# Patient Record
Sex: Female | Born: 1935
Health system: Southern US, Community
[De-identification: ages and names within clinical notes are randomized; demographics above are authoritative.]

## PROBLEM LIST (undated history)

## (undated) DIAGNOSIS — M5416 Radiculopathy, lumbar region: Secondary | ICD-10-CM

## (undated) DIAGNOSIS — E785 Hyperlipidemia, unspecified: Secondary | ICD-10-CM

## (undated) DIAGNOSIS — H3530 Unspecified macular degeneration: Secondary | ICD-10-CM

## (undated) DIAGNOSIS — F329 Major depressive disorder, single episode, unspecified: Secondary | ICD-10-CM

## (undated) DIAGNOSIS — Z8542 Personal history of malignant neoplasm of other parts of uterus: Secondary | ICD-10-CM

## (undated) DIAGNOSIS — R29898 Other symptoms and signs involving the musculoskeletal system: Principal | ICD-10-CM

## (undated) DIAGNOSIS — G5602 Carpal tunnel syndrome, left upper limb: Secondary | ICD-10-CM

## (undated) DIAGNOSIS — M5430 Sciatica, unspecified side: Secondary | ICD-10-CM

## (undated) DIAGNOSIS — C801 Malignant (primary) neoplasm, unspecified: Secondary | ICD-10-CM

## (undated) DIAGNOSIS — H8110 Benign paroxysmal vertigo, unspecified ear: Secondary | ICD-10-CM

## (undated) DIAGNOSIS — M797 Fibromyalgia: Secondary | ICD-10-CM

## (undated) DIAGNOSIS — R4189 Other symptoms and signs involving cognitive functions and awareness: Secondary | ICD-10-CM

## (undated) DIAGNOSIS — G459 Transient cerebral ischemic attack, unspecified: Secondary | ICD-10-CM

## (undated) DIAGNOSIS — F32A Depression, unspecified: Secondary | ICD-10-CM

## (undated) DIAGNOSIS — H02401 Unspecified ptosis of right eyelid: Secondary | ICD-10-CM

## (undated) DIAGNOSIS — L920 Granuloma annulare: Secondary | ICD-10-CM

## (undated) HISTORY — DX: Major depressive disorder, single episode, unspecified: F32.9

## (undated) HISTORY — DX: Depression, unspecified: F32.A

## (undated) HISTORY — DX: Hyperlipidemia, unspecified: E78.5

## (undated) HISTORY — DX: Sciatica, unspecified side: M54.30

## (undated) HISTORY — DX: Transient cerebral ischemic attack, unspecified: G45.9

## (undated) HISTORY — DX: Carpal tunnel syndrome, left upper limb: G56.02

## (undated) HISTORY — PX: TONSILLECTOMY: SUR1361

## (undated) HISTORY — DX: Fibromyalgia: M79.7

## (undated) HISTORY — DX: Benign paroxysmal vertigo, unspecified ear: H81.10

## (undated) HISTORY — PX: ABDOMINAL HYSTERECTOMY: SHX81

## (undated) HISTORY — PX: CATARACT EXTRACTION: SUR2

## (undated) HISTORY — DX: Unspecified ptosis of right eyelid: H02.401

## (undated) HISTORY — DX: Other symptoms and signs involving cognitive functions and awareness: R41.89

## (undated) HISTORY — DX: Other symptoms and signs involving the musculoskeletal system: R29.898

## (undated) HISTORY — DX: Radiculopathy, lumbar region: M54.16

## (undated) HISTORY — DX: Granuloma annulare: L92.0

## (undated) HISTORY — DX: Personal history of malignant neoplasm of other parts of uterus: Z85.42

## (undated) HISTORY — DX: Malignant (primary) neoplasm, unspecified: C80.1

## (undated) HISTORY — DX: Unspecified macular degeneration: H35.30

---

## 1999-02-07 ENCOUNTER — Other Ambulatory Visit: Admission: RE | Admit: 1999-02-07 | Discharge: 1999-02-07 | Payer: Self-pay | Admitting: Obstetrics and Gynecology

## 2000-02-12 ENCOUNTER — Other Ambulatory Visit: Admission: RE | Admit: 2000-02-12 | Discharge: 2000-02-12 | Payer: Self-pay | Admitting: Obstetrics and Gynecology

## 2001-02-16 ENCOUNTER — Other Ambulatory Visit: Admission: RE | Admit: 2001-02-16 | Discharge: 2001-02-16 | Payer: Self-pay | Admitting: Obstetrics and Gynecology

## 2001-07-28 ENCOUNTER — Encounter (INDEPENDENT_AMBULATORY_CARE_PROVIDER_SITE_OTHER): Payer: Self-pay | Admitting: Specialist

## 2001-07-28 ENCOUNTER — Ambulatory Visit (HOSPITAL_COMMUNITY): Admission: RE | Admit: 2001-07-28 | Discharge: 2001-07-28 | Payer: Self-pay | Admitting: Obstetrics and Gynecology

## 2001-12-15 DIAGNOSIS — Z8542 Personal history of malignant neoplasm of other parts of uterus: Secondary | ICD-10-CM

## 2001-12-15 HISTORY — DX: Personal history of malignant neoplasm of other parts of uterus: Z85.42

## 2002-01-05 ENCOUNTER — Ambulatory Visit (HOSPITAL_COMMUNITY): Admission: RE | Admit: 2002-01-05 | Discharge: 2002-01-05 | Payer: Self-pay | Admitting: Obstetrics and Gynecology

## 2002-01-05 ENCOUNTER — Encounter (INDEPENDENT_AMBULATORY_CARE_PROVIDER_SITE_OTHER): Payer: Self-pay

## 2002-01-25 ENCOUNTER — Other Ambulatory Visit: Admission: RE | Admit: 2002-01-25 | Discharge: 2002-01-25 | Payer: Self-pay | Admitting: Obstetrics and Gynecology

## 2002-02-02 ENCOUNTER — Ambulatory Visit: Admission: RE | Admit: 2002-02-02 | Discharge: 2002-02-02 | Payer: Self-pay | Admitting: Gynecology

## 2002-02-11 ENCOUNTER — Encounter: Payer: Self-pay | Admitting: Gynecology

## 2002-02-15 ENCOUNTER — Encounter (INDEPENDENT_AMBULATORY_CARE_PROVIDER_SITE_OTHER): Payer: Self-pay

## 2002-02-15 ENCOUNTER — Inpatient Hospital Stay (HOSPITAL_COMMUNITY): Admission: RE | Admit: 2002-02-15 | Discharge: 2002-02-16 | Payer: Self-pay | Admitting: Gynecology

## 2002-03-30 ENCOUNTER — Ambulatory Visit: Admission: RE | Admit: 2002-03-30 | Discharge: 2002-03-30 | Payer: Self-pay | Admitting: Gynecology

## 2002-05-11 ENCOUNTER — Other Ambulatory Visit: Admission: RE | Admit: 2002-05-11 | Discharge: 2002-05-11 | Payer: Self-pay | Admitting: Obstetrics & Gynecology

## 2002-09-01 ENCOUNTER — Other Ambulatory Visit: Admission: RE | Admit: 2002-09-01 | Discharge: 2002-09-01 | Payer: Self-pay | Admitting: Obstetrics and Gynecology

## 2002-11-30 ENCOUNTER — Other Ambulatory Visit: Admission: RE | Admit: 2002-11-30 | Discharge: 2002-11-30 | Payer: Self-pay | Admitting: Obstetrics and Gynecology

## 2003-03-09 ENCOUNTER — Other Ambulatory Visit: Admission: RE | Admit: 2003-03-09 | Discharge: 2003-03-09 | Payer: Self-pay | Admitting: Obstetrics and Gynecology

## 2003-03-28 ENCOUNTER — Ambulatory Visit (HOSPITAL_COMMUNITY): Admission: RE | Admit: 2003-03-28 | Discharge: 2003-03-28 | Payer: Self-pay | Admitting: Gastroenterology

## 2003-07-10 ENCOUNTER — Other Ambulatory Visit: Admission: RE | Admit: 2003-07-10 | Discharge: 2003-07-10 | Payer: Self-pay | Admitting: Obstetrics and Gynecology

## 2003-11-07 ENCOUNTER — Other Ambulatory Visit: Admission: RE | Admit: 2003-11-07 | Discharge: 2003-11-07 | Payer: Self-pay | Admitting: Obstetrics and Gynecology

## 2004-03-05 ENCOUNTER — Other Ambulatory Visit: Admission: RE | Admit: 2004-03-05 | Discharge: 2004-03-05 | Payer: Self-pay | Admitting: Obstetrics and Gynecology

## 2004-09-03 ENCOUNTER — Other Ambulatory Visit: Admission: RE | Admit: 2004-09-03 | Discharge: 2004-09-03 | Payer: Self-pay | Admitting: Obstetrics and Gynecology

## 2005-03-03 ENCOUNTER — Other Ambulatory Visit: Admission: RE | Admit: 2005-03-03 | Discharge: 2005-03-03 | Payer: Self-pay | Admitting: Obstetrics and Gynecology

## 2005-08-19 ENCOUNTER — Other Ambulatory Visit: Admission: RE | Admit: 2005-08-19 | Discharge: 2005-08-19 | Payer: Self-pay | Admitting: Obstetrics and Gynecology

## 2005-12-15 HISTORY — PX: BREAST BIOPSY: SHX20

## 2006-02-10 ENCOUNTER — Encounter: Admission: RE | Admit: 2006-02-10 | Discharge: 2006-02-10 | Payer: Self-pay | Admitting: General Surgery

## 2006-02-12 ENCOUNTER — Encounter (INDEPENDENT_AMBULATORY_CARE_PROVIDER_SITE_OTHER): Payer: Self-pay | Admitting: Specialist

## 2006-02-12 ENCOUNTER — Ambulatory Visit (HOSPITAL_BASED_OUTPATIENT_CLINIC_OR_DEPARTMENT_OTHER): Admission: RE | Admit: 2006-02-12 | Discharge: 2006-02-12 | Payer: Self-pay | Admitting: General Surgery

## 2006-02-16 ENCOUNTER — Encounter: Admission: RE | Admit: 2006-02-16 | Discharge: 2006-02-16 | Payer: Self-pay | Admitting: General Surgery

## 2006-03-02 ENCOUNTER — Other Ambulatory Visit: Admission: RE | Admit: 2006-03-02 | Discharge: 2006-03-02 | Payer: Self-pay | Admitting: Obstetrics and Gynecology

## 2006-08-25 ENCOUNTER — Other Ambulatory Visit: Admission: RE | Admit: 2006-08-25 | Discharge: 2006-08-25 | Payer: Self-pay | Admitting: Obstetrics and Gynecology

## 2007-03-03 ENCOUNTER — Other Ambulatory Visit: Admission: RE | Admit: 2007-03-03 | Discharge: 2007-03-03 | Payer: Self-pay | Admitting: Obstetrics and Gynecology

## 2007-05-21 ENCOUNTER — Emergency Department (HOSPITAL_COMMUNITY): Admission: EM | Admit: 2007-05-21 | Discharge: 2007-05-21 | Payer: Self-pay | Admitting: Emergency Medicine

## 2007-09-01 ENCOUNTER — Other Ambulatory Visit: Admission: RE | Admit: 2007-09-01 | Discharge: 2007-09-01 | Payer: Self-pay | Admitting: Obstetrics and Gynecology

## 2008-09-07 ENCOUNTER — Other Ambulatory Visit: Admission: RE | Admit: 2008-09-07 | Discharge: 2008-09-07 | Payer: Self-pay | Admitting: Obstetrics and Gynecology

## 2009-08-14 ENCOUNTER — Encounter: Admission: RE | Admit: 2009-08-14 | Discharge: 2009-08-14 | Payer: Self-pay | Admitting: Orthopedic Surgery

## 2009-09-04 ENCOUNTER — Encounter: Admission: RE | Admit: 2009-09-04 | Discharge: 2009-09-04 | Payer: Self-pay | Admitting: Orthopedic Surgery

## 2009-09-13 ENCOUNTER — Other Ambulatory Visit: Admission: RE | Admit: 2009-09-13 | Discharge: 2009-09-13 | Payer: Self-pay | Admitting: Obstetrics and Gynecology

## 2011-01-04 ENCOUNTER — Encounter: Payer: Self-pay | Admitting: Obstetrics and Gynecology

## 2011-04-15 ENCOUNTER — Other Ambulatory Visit: Payer: Self-pay | Admitting: Gastroenterology

## 2011-05-02 NOTE — Op Note (Signed)
[facility]  Patient:    Janet [name], Janet [name]                         MRN: [id number] Proc. Date: 07/28/01 Adm. Date:  [id number] Attending:  Morene Antu                           Operative Report  PREOPERATIVE DIAGNOSES:       1. Postmenopausal bleeding.                               2. Thickened endometrium. POSTOPERATIVE DIAGNOSES:      1. Postmenopausal bleeding.                               2. Thickened endometrium.                               3. Endometrial polyp.  PROCEDURE:                    Dilation and curettage hysteroscopy with the resectoscope.  SURGEON:                      Sherry A. Rosalio Macadamia, M.D.  ANESTHESIA:                   MAC.  INDICATIONS:                  This is a 77 year old G2, P1-1-0-2 woman who has had irregular brown spotting and vaginal bleeding for several years.  She has been on hormone replacement therapy and, most of the time, the bleeding was felt to be secondary to that.  Because of persistence of bleeding, ultrasound was performed, showing a thickened endometrium.  Sonohystogram was then performed, showing probable endometrial polyp.  Because of the persistence of this mass, the patient is brought to the operating room for [facility] hysteroscopy with the resectoscope.  FINDINGS:                     Normal-sized, retroverted uterus.  No adnexal mass.  Small polypoid tissue present in the endometrial cavity.  DESCRIPTION OF PROCEDURE:     The patient was brought to the operating room and given adequate IV sedation.  She was placed in the dorsal lithotomy position.  Her perineum was washed with Betadine.  Pelvic examination was performed.  The patient was draped in a sterile fashion.  A speculum was placed within the vagina.  The vagina was washed with Betadine.  Paracervical block was administered with 1% Nesacaine.  The anterior lip of the cervix was grasped with a single-tooth tenaculum.  The cervix was  sounded.  The cervix was dilated with Pratt dilators to a #31.  The hysteroscope was introduced into the endometrial cavity.  Pictures were obtained.  Small polypoid tissue was present.  No large polyp could be identified.  The cornual regions were seen bilaterally.  However, they felt to be more consistent with a septation in the middle of the uterus, because each cornual region was at the end of a small tunnel.  Using a right-angle double-loop resector, the small endometrial polyp was removed and samples of tissue were  taken circumferentially.  There were small bleeding sites at the top of the uterus prior to resection.  This area was resected out and adequate hemostasis was present.  Final pictures were taken.  All instruments were taken out of the vagina.  The patient was taken out of the dorsal lithotomy position.  She was awakened.  She was moved from the operating table to the stretcher in stable condition.  COMPLICATIONS:                None.  ESTIMATED BLOOD LOSS:         Less than 5 cc.  SORBITOL DIFFERENTIAL:        -50 cc. DD:  07/28/01 TD:  07/28/01 Job: [id number]

## 2011-05-02 NOTE — Op Note (Signed)
[facility]  Patient:    Janet [name], Janet [name] Visit Number: [id number] MRN: [id number]          Service Type: GYN Location: 4W 0466 01 Attending Physician:  Jeannette Corpus Dictated by:   Rande Brunt. Clarke-Pearson, M.D. Proc. Date: 02/15/02 Admit Date:  02/15/2002   CC:         [name], M.D.  Telford Nab, R.N.   Operative Report  PREOPERATIVE DIAGNOSIS:  Grade 1 endometrial adenocarcinoma.  POSTOPERATIVE DIAGNOSIS:  Grade 1 endometrial adenocarcinoma.  PROCEDURE:  Laparoscopically-assisted vaginal hysterectomy, bilateral salpingo-oophorectomy.  SURGEON:  Daniel L. Clarke-Pearson, M.D.  ASSISTANT:  Roseanna Rainbow, M.D. and Telford Nab, R.N.  ANESTHESIA:  General with orotracheal tube.  ESTIMATED BLOOD LOSS:  200 cc.  SURGICAL FINDINGS:  At laparoscopy, the upper abdomen was visualized.  The diaphragms, liver, gallbladder, stomach, omentum, and colon appeared normal. In the pelvis, the uterus, tubes, and ovaries appeared normal except for some paratubal cysts which were small.  On frozen section of the uterus, there was no apparent myometrial invasion.  DESCRIPTION OF PROCEDURE:  The patient was brought to the operating room and after satisfactory obtainment of general anesthesia, was placed in the modified lithotomy position in Purcell stirrups.  The anterior abdominal wall, perineum, and vagina were prepped with Betadine.  A Foley catheter was placed. A Hulka tenaculum was placed on the cervix and the patient was draped.  The abdomen was entered through an open laparoscopic technique with an incision in the umbilicus.  The Hasson cannula was placed and tied down with sutures to achieve a pneumoperitoneum.  The upper abdomen was visualized as noted above. Additional ports were placed.  Two lateral 5 mm ports were placed under direct visualization lateral to the inferior epigastric vessels.  A 12 mm suprapubic port  was placed as well under direct visualization.  The bowel was moved out of the pelvis.  The left round ligament was divided and the peritoneum of the left lateral pelvic side wall was opened.  The ovarian vessels, external iliac artery and vein, and ureter were identified.  A window was made beneath the ovarian vessels.  A linear stapling device was then fired across the ovarian vessels.  A similar procedure was performed on the right side.  The bladder flap was mobilized.  The patient was then placed in the lithotomy position and attention was directed towards performing a vaginal hysterectomy.  A circumferential incision was made at the junction of the cervix and vagina.  The bladder was mobilized anteriorly.  A posterior colpotomy incision was made.  Uterosacral ligaments were clamped, cut, and suture ligated, and the ligature is held.  In a step-wise fashion, the cardinal ligaments and uterine vessels were clamped, cut, and suture ligated.  The bladder was advanced throughout until the peritoneal cavity was entered anteriorly.  The uterus was rotated posteriorly, the adnexa mobilized, and the upper pedicles of the uterus divided.  These were suture ligated as well.  The uterus, cervix, tubes, and ovaries were handed off the operative field for frozen section with the above noted report.  The vaginal cuff was closed with interrupted 2-0 Vicryl sutures incorporating the anterior and posterior vaginal mucosa as well as peritoneum in a single layer.  The ligatures on the uterosacral ligaments were resutured to the vaginal cuff at the angles and tied.  Attention was then returned to visualization of the pelvis with the laparoscope.  All vascular pedicles were revisualized as well as  the vaginal cuff and no bleeding was noted.  The ports were removed under direct visualization.  No bleeding was noted.  The pneumoperitoneum was released. The fascia, the umbilical, and suprapubic incisions  were reapproximated with a figure-of-eight suture of 0 Vicryl.  The wounds were then irrigated and then reapproximated with interrupted 3-0 Vicryl sutures in a subcuticular fashion. Steri-Strips were applied.  A dressing was applied.  The patient was awakened from anesthesia and taken to the recovery room in satisfactory condition.  Sponge, needle, and instrument count was correct x2. Dictated by:   Rande Brunt. Clarke-Pearson, M.D. Attending Physician:  Jeannette Corpus DD:  02/15/02 TD:  02/16/02 Job: [id number]

## 2011-05-02 NOTE — Consult Note (Signed)
[facility]  Patient:    Janet [name], Janet [name] Visit Number: [id number] MRN: [id number]          Service Type: GON Location: GYN Attending Physician:  Jeannette Corpus Dictated by:   Rande Brunt. Clarke-Pearson, M.D. Proc. Date: 03/30/02 Admit Date:  03/30/2002   CC:         [name], M.D.  Telford Nab, R.N.   Consultation Report  HISTORY OF PRESENT ILLNESS:  A 77 year old white female returns for six-week postoperative checkup, having undergone a laparoscopically assisted vaginal hysterectomy and bilateral salpingo-oophorectomy on February 15, 2002.  Final pathology showed she had a grade 1 endometrial carcinoma with no myometrial invasion and negative washings.  The patient had an uncomplicated postoperative course.  Today she expresses a number of concerns about whether she may have strained when lifting a bag of laundry or taken an extra step off her back stoop.  She had some vaginal spotting but has not had any for at least two weeks.  She also has a number of concerns about her urethra as possibly prolapsing.  Her functional status is good, and she has returned to nearly full levels of activity.  PHYSICAL EXAMINATION:  VITAL SIGNS:  Weight 138 pounds, blood pressure 124/76.  ABDOMEN:  Soft, nontender.  No mass, organomegaly, ascites, or hernias noted. Laparoscopic incisions are all healing very nicely.  PELVIC:  EG, BUS normal.  Urethra, bladder are normal.  The vagina is normal. The cuff is healing well.  There is still some suture material present at the cuff.  No bleeding is noted.  Bimanual and rectovaginal exam confirm these findings.  IMPRESSION:  Stage IA, grade 1 endometrial carcinoma.  The patient is doing well and was given the okay to return to full levels of activity.  She will establish contact with her local gynecologist, and we would recommend that she have exams, Pap smears, and pelvic exams every three months  for the first year, every four months in the second year, and every six months to complete five years of follow-up.  The patient is reassured she does not have urethral prolapse, and I see no evidence that her lifting or stepping off the back step of her house has led to any morbidity. Dictated by:   Rande Brunt. Clarke-Pearson, M.D. Attending Physician:  Jeannette Corpus DD:  03/30/02 TD:  03/31/02 Job: [id number]

## 2011-05-02 NOTE — Discharge Summary (Signed)
[facility]  Patient:    Janet [name], SPAN Visit Number: [id number] MRN: [id number]          Service Type: GYN Location: 4W 0466 01 Attending Physician:  Jeannette Corpus Dictated by:   Rande Brunt. Clarke-Pearson, M.D. Admit Date:  02/15/2002 Discharge Date: 02/16/2002   CC:         Telford Nab, R.N.   Discharge Summary  HOSPITAL COURSE:  Following admission, patient was taken to the operating room where she underwent a laparoscopically assisted vaginal hysterectomy and bilateral salpingo-oophorectomy for treatment of a grade I endometrial adenocarcinoma.  She had an uncomplicated surgical course and frozen section showed no evidence of myometrial invasion.  Postoperatively, the patient had her Foley catheter removed the day of surgery and she was advanced on her diet and, by the afternoon of the first postoperative day, she was eating a regular diet, ambulating, and voiding well, and was ready for discharge.  Her postoperative hematocrit was relatively stable.  FINAL DIAGNOSIS:  Grade 1 endometrial adenocarcinoma status post laparoscopically assisted vaginal hysterectomy and bilateral salpingo-oophorectomy.  DISCHARGE INSTRUCTIONS:  The patient was given the instructions for pelvic rest.  She is given a prescription for Vicodin to be used p.r.n. pain.  She will resume a regular diet and resume normal activities but will avoid any heavy lifting.  She will return to see Dr. Stanford Breed in six weeks for postoperative checkup.  We will contact the patient with her final pathology report.  CONDITION AT DISCHARGE:  Improved. Dictated by:   Rande Brunt. Clarke-Pearson, M.D. Attending Physician:  Jeannette Corpus DD:  02/16/02 TD:  02/17/02 Job: [id number]

## 2011-05-02 NOTE — Consult Note (Signed)
[facility]  Patient:    Janet [name], Janet [name] Visit Number: [id number] MRN: [id number]          Service Type: GON Location: GYN Attending Physician:  Jeannette Corpus Dictated by:   Rande Brunt. Clarke-Pearson, M.D. Admit Date:  02/02/2002   CC:         [name], M.D.  Telford Nab, R.N.   Consultation Report  REASON FOR CONSULTATION:  This is a 77 year old white married female who is self-referred for management of newly diagnosed endometrial cancer. The patient reports that she has been on hormone replacement therapy since 1998, and in May 2002 she discontinued using it and subsequently in June she started having some vaginal spotting which has persisted off and on. Ultimately she was evaluated by Dr. Floyde Parkins who performed an endometrial biopsy on January 05, 2002, which showed a well-differentiated endometrial adenocarcinoma. The patient has had some spotting since that time but otherwise is doing quite well.  PAST MEDICAL HISTORY/PAST GYNECOLOGIC HISTORY:  Essentially negative. She had a D&C for abnormal bleeding around the time of menopause, nearly 20 years ago.  MEDICAL ILLNESSES:  "Fibromyalgia." She does not take any treatment for this, and she says it must be a mild case.  ALLERGIES:  No known drug allergies.  CURRENT MEDICATIONS: 1. Paxil 10 mg a day. 2. Tums for calcium. 3. Vitamin E 400 IU.  PAST SURGICAL HISTORY:  Tonsillectomy and adenoidectomy.  FAMILY HISTORY:  Negative for gynecologic or colon cancer. She has a sister who had breast cancer. The patient herself has had mammograms which were negative within the last 2 months.  SOCIAL HISTORY:  The patient has 2 living children. She does not smoke. Her husband is a retired Radiographer, therapeutic.  REVIEW OF SYSTEMS:  Negative except as noted in the History of Present Illness.  PHYSICAL EXAMINATION:  VITAL SIGNS:  Weight 137-1/2 pounds. Height 5 feet  4 inches. Blood pressure 125/70, pulse 84, respiratory rate 16.  GENERAL:  The patient is a healthy white female in no acute distress.  HEENT:  Negative.  NECK:  Supple without thyromegaly. There is no supraclavicular or inguinal adenopathy.  ABDOMEN:  Soft, nontender. No masses, organomegaly, ascites, or hernias are noted.  PELVIC:  EG/BUS normal. Vagina is clean. Cervix is parous, clean, and has good mobility. Uterus is retroverted. There are no adnexa masses noted.  RECTOVAGINAL:  Confirms.  IMPRESSION:  Grade 1 endometrial carcinoma. Clinically, this is stage I.  RECOMMENDATION:  Given the good grade of this tumor, I would recommend that the patient undergo a hysterectomy and bilateral salpingo-oophorectomy. Pros and cons of the laparoscopic approach versus an open approach were discussed with the patient, and at the conclusion of that conversation, she wishes to proceed with laparoscopically-assisted vaginal hysterectomy and bilateral salpingo-oophorectomy. At that time, we will explore the abdomen and obtain peritoneal washings. If on frozen section she has deep invasion, she understands that she will have a lymphadenectomy as well. All of the patients questions are answered. She wishes to go ahead and schedule surgery. Our next available day is February 15, 2002.  The risks of surgery, including injury to adjacent viscera, hemorrhage, infection, thromboembolic complications, and anesthetic risks were outlined to the patient and her husband. They understand these risks and wish to proceed with surgery. All of their questions are answered. Dictated by:   Rande Brunt. Clarke-Pearson, M.D. Attending Physician:  Jeannette Corpus DD:  02/02/02 TD:  02/03/02 Job: [id number]

## 2011-05-02 NOTE — Op Note (Signed)
[facility]  Patient:    Janet [name], Janet [name] Visit Number: [id number] MRN: [id number]          Service Type: DSU Location: [facility] Attending Physician:  Morene Antu Dictated by:   Sherry A. Rosalio Macadamia, M.D. Proc. Date: 01/05/02 Admit Date:  01/05/2002                             Operative Report  PREOPERATIVE DIAGNOSIS:       Thickened endometrium with previous endometrial glandular atypia.  POSTOPERATIVE DIAGNOSIS:      Thickened endometrium with previous endometrial glandular atypia.  SURGEON:                      Sherry A. Rosalio Macadamia, M.D.  ANESTHESIA:                   MAC.  INDICATIONS:                  This is a 77 year old G2, P1-1-0-2 woman who has had a history of postmenopausal bleeding.  Patient had a D&C in August 2002 at which time the pathology returned focal glandular atypia.  Repeat ultrasound was performed in December which revealed a thickened endometrial cavity with especially thickened area on the anterior wall.  Because of this abnormality and the patients previous pathology report, the patient was brought into the operating room for repeat D&C, hysteroscopy with resectoscope.  FINDINGS:                     Normal sized retroverted uterus.  No adnexal mass.  Endometrial cavity:  Irregular.  PROCEDURE:                    Patient was brought into the operating room and given adequate IV sedation.  She was placed in the dorsal lithotomy position. Perineum was washed with Hibiclens.  Pelvic examination was performed. Surgeons gown and gloves were changed.  Patient was draped in a sterile fashion.  Speculum was placed in the vagina.  Vagina was washed with Hibiclens.  Paracervical block was administered with 1% Nesacaine.  Anterior lip of the cervix was grasped with a single tooth tenaculum.  Cervix was sounded.  Cervix was dilated with Pratt dilators to a #31.  Hysteroscope was easily introduced into the endometrial cavity.   Pictures were obtained.  Using double loop right angle resector sheets of endometrial tissue were taken circumferentially, especially on the anterior uterine wall.  The small bleeders were cauterized.  Once adequate hemostasis was present repeat pictures were taken.  All instruments removed from the vagina.  Patient was taken out of the dorsal lithotomy position.  She was awakened.  She was moved from the operating table to a stretcher in stable condition.  COMPLICATIONS:                None.  ESTIMATED BLOOD LOSS:         Less than 5 cc. Dictated by:   Sherry A. Rosalio Macadamia, M.D. Attending Physician:  Morene Antu DD:  01/05/02 TD:  01/06/02 Job: [id number]

## 2011-05-02 NOTE — Op Note (Signed)
NAMEJASLYNN, Janet [name]                  ACCOUNT NO.:  [id number]   MEDICAL RECORD NO.:  [id number]          PATIENT TYPE:  AMB   LOCATION:  [facility]                         FACILITY:  [facility]   PHYSICIAN:  Adolph Pollack, M.D.DATE OF BIRTH:  [birth year]   DATE OF PROCEDURE:  02/12/2006  DATE OF DISCHARGE:                                 OPERATIVE REPORT   PREOP DIAGNOSIS:  Right breast mass in axillary tail of Spence area.   POSTOPERATIVE DIAGNOSIS:  Right breast mass in axillary tail of Spence area.   PROCEDURE:  Right breast biopsy.   SURGEON:  Adolph Pollack, M.D.   ANESTHESIA:  Local (1% lidocaine with epinephrine plus 0.5% plain Marcaine,  plus sodium bicarbonate) with MAC.   INDICATIONS:  [name] is a 77 year old female who has had a persistent mass  in axillary tail area of the right breast. It is difficult to characterize.  She now presents for biopsy. We discussed the procedure; and the risks  preoperatively.   TECHNIQUE:  She is seen in the holding area and the right breast area marked  by my initials. She was brought to the operating room and placed supine on  the operating table. The breast was palpated, and marked; and then she was  given intravenous sedation. The right breast and axillary area were  sterilely prepped and draped. At the right breast axillary interface, I  injected local anesthesia then made an incision directly over the area and  raised small flaps. I subsequently was able to excise the palpable mass  sharply, but it went down to the chest wall. I did end up identifying  intercostal brachial cutaneous nerve, and preserved this. There was one  other palpable mass which may have been a lymph node, which I excised as  well, but both of these were sent fresh to pathology.   Following this bleeding was controlled with interrupted 3-0 Vicryl sutures  and electrocautery. Once hemostasis was adequate. I injected deeper into the  biopsy bed with a local  anesthetic. I then closed the wound in two layers  closing the subcutaneous tissue with interrupted 3-0 Vicryl sutures; and  closing the skin with a running 4-0 Monocryl subcuticular stitch. Steri-  Strips and sterile dressings were applied.   She tolerated the procedure without apparent complications.  She was taken  to recovery room in satisfactory condition. She was given discharge  instructions and Ultram for pain and to followup in the office in 1-2 weeks.      Adolph Pollack, M.D.  Electronically Signed     TJR/MEDQ  D:  02/12/2006  T:  02/12/2006  Job:  [id number]   cc:   Artist Pais, M.D.  Fax: [phone]   Tasia Catchings, M.D.  Fax: [phone]

## 2011-07-22 ENCOUNTER — Other Ambulatory Visit: Payer: Self-pay | Admitting: Obstetrics and Gynecology

## 2011-07-22 ENCOUNTER — Other Ambulatory Visit (HOSPITAL_COMMUNITY)
Admission: RE | Admit: 2011-07-22 | Discharge: 2011-07-22 | Disposition: A | Discharge status: Home / Self Care | Payer: Medicare Other | Source: Ambulatory Visit | Attending: Obstetrics and Gynecology | Admitting: Obstetrics and Gynecology

## 2011-07-22 DIAGNOSIS — Z124 Encounter for screening for malignant neoplasm of cervix: Secondary | ICD-10-CM | POA: Insufficient documentation

## 2012-01-29 DIAGNOSIS — M9981 Other biomechanical lesions of cervical region: Secondary | ICD-10-CM | POA: Diagnosis not present

## 2012-01-29 DIAGNOSIS — M999 Biomechanical lesion, unspecified: Secondary | ICD-10-CM | POA: Diagnosis not present

## 2012-01-29 DIAGNOSIS — M5030 Other cervical disc degeneration, unspecified cervical region: Secondary | ICD-10-CM | POA: Diagnosis not present

## 2012-01-29 DIAGNOSIS — M5137 Other intervertebral disc degeneration, lumbosacral region: Secondary | ICD-10-CM | POA: Diagnosis not present

## 2012-02-02 DIAGNOSIS — M238X9 Other internal derangements of unspecified knee: Secondary | ICD-10-CM | POA: Diagnosis not present

## 2012-02-16 DIAGNOSIS — M238X9 Other internal derangements of unspecified knee: Secondary | ICD-10-CM | POA: Diagnosis not present

## 2012-03-11 DIAGNOSIS — M9981 Other biomechanical lesions of cervical region: Secondary | ICD-10-CM | POA: Diagnosis not present

## 2012-03-11 DIAGNOSIS — M999 Biomechanical lesion, unspecified: Secondary | ICD-10-CM | POA: Diagnosis not present

## 2012-03-11 DIAGNOSIS — M5137 Other intervertebral disc degeneration, lumbosacral region: Secondary | ICD-10-CM | POA: Diagnosis not present

## 2012-03-11 DIAGNOSIS — M5030 Other cervical disc degeneration, unspecified cervical region: Secondary | ICD-10-CM | POA: Diagnosis not present

## 2012-03-25 DIAGNOSIS — H40059 Ocular hypertension, unspecified eye: Secondary | ICD-10-CM | POA: Diagnosis not present

## 2012-03-25 DIAGNOSIS — H3530 Unspecified macular degeneration: Secondary | ICD-10-CM | POA: Diagnosis not present

## 2012-04-01 DIAGNOSIS — IMO0002 Reserved for concepts with insufficient information to code with codable children: Secondary | ICD-10-CM | POA: Diagnosis not present

## 2012-05-18 DIAGNOSIS — L600 Ingrowing nail: Secondary | ICD-10-CM | POA: Diagnosis not present

## 2012-05-18 DIAGNOSIS — L719 Rosacea, unspecified: Secondary | ICD-10-CM | POA: Diagnosis not present

## 2012-05-18 DIAGNOSIS — D239 Other benign neoplasm of skin, unspecified: Secondary | ICD-10-CM | POA: Diagnosis not present

## 2012-05-20 DIAGNOSIS — M9981 Other biomechanical lesions of cervical region: Secondary | ICD-10-CM | POA: Diagnosis not present

## 2012-05-20 DIAGNOSIS — M5030 Other cervical disc degeneration, unspecified cervical region: Secondary | ICD-10-CM | POA: Diagnosis not present

## 2012-05-20 DIAGNOSIS — M999 Biomechanical lesion, unspecified: Secondary | ICD-10-CM | POA: Diagnosis not present

## 2012-05-20 DIAGNOSIS — M5137 Other intervertebral disc degeneration, lumbosacral region: Secondary | ICD-10-CM | POA: Diagnosis not present

## 2012-06-15 DIAGNOSIS — R000 Tachycardia, unspecified: Secondary | ICD-10-CM | POA: Diagnosis not present

## 2012-06-15 DIAGNOSIS — E782 Mixed hyperlipidemia: Secondary | ICD-10-CM | POA: Diagnosis not present

## 2012-06-15 DIAGNOSIS — Z0000 Encounter for general adult medical examination without abnormal findings: Secondary | ICD-10-CM | POA: Diagnosis not present

## 2012-06-15 DIAGNOSIS — R300 Dysuria: Secondary | ICD-10-CM | POA: Diagnosis not present

## 2012-06-15 DIAGNOSIS — Z79899 Other long term (current) drug therapy: Secondary | ICD-10-CM | POA: Diagnosis not present

## 2012-07-08 DIAGNOSIS — M5030 Other cervical disc degeneration, unspecified cervical region: Secondary | ICD-10-CM | POA: Diagnosis not present

## 2012-07-08 DIAGNOSIS — M5137 Other intervertebral disc degeneration, lumbosacral region: Secondary | ICD-10-CM | POA: Diagnosis not present

## 2012-07-08 DIAGNOSIS — M9981 Other biomechanical lesions of cervical region: Secondary | ICD-10-CM | POA: Diagnosis not present

## 2012-07-08 DIAGNOSIS — M999 Biomechanical lesion, unspecified: Secondary | ICD-10-CM | POA: Diagnosis not present

## 2012-08-24 DIAGNOSIS — R350 Frequency of micturition: Secondary | ICD-10-CM | POA: Diagnosis not present

## 2012-09-07 ENCOUNTER — Other Ambulatory Visit (HOSPITAL_COMMUNITY)
Admission: RE | Admit: 2012-09-07 | Discharge: 2012-09-07 | Disposition: A | Discharge status: Home / Self Care | Payer: Medicare Other | Source: Ambulatory Visit | Attending: Obstetrics and Gynecology | Admitting: Obstetrics and Gynecology

## 2012-09-07 ENCOUNTER — Other Ambulatory Visit: Payer: Self-pay | Admitting: Obstetrics and Gynecology

## 2012-09-07 DIAGNOSIS — Z124 Encounter for screening for malignant neoplasm of cervix: Secondary | ICD-10-CM | POA: Diagnosis not present

## 2012-09-07 DIAGNOSIS — Z01419 Encounter for gynecological examination (general) (routine) without abnormal findings: Secondary | ICD-10-CM | POA: Diagnosis not present

## 2012-09-07 DIAGNOSIS — C549 Malignant neoplasm of corpus uteri, unspecified: Secondary | ICD-10-CM | POA: Diagnosis not present

## 2012-09-07 DIAGNOSIS — N952 Postmenopausal atrophic vaginitis: Secondary | ICD-10-CM | POA: Diagnosis not present

## 2012-09-07 DIAGNOSIS — N362 Urethral caruncle: Secondary | ICD-10-CM | POA: Diagnosis not present

## 2012-09-16 DIAGNOSIS — IMO0002 Reserved for concepts with insufficient information to code with codable children: Secondary | ICD-10-CM | POA: Diagnosis not present

## 2012-09-23 DIAGNOSIS — H2510 Age-related nuclear cataract, unspecified eye: Secondary | ICD-10-CM | POA: Diagnosis not present

## 2012-09-23 DIAGNOSIS — H40059 Ocular hypertension, unspecified eye: Secondary | ICD-10-CM | POA: Diagnosis not present

## 2012-09-29 DIAGNOSIS — Z1231 Encounter for screening mammogram for malignant neoplasm of breast: Secondary | ICD-10-CM | POA: Diagnosis not present

## 2012-12-28 DIAGNOSIS — L723 Sebaceous cyst: Secondary | ICD-10-CM | POA: Diagnosis not present

## 2013-03-03 DIAGNOSIS — IMO0002 Reserved for concepts with insufficient information to code with codable children: Secondary | ICD-10-CM | POA: Diagnosis not present

## 2013-03-22 DIAGNOSIS — M48061 Spinal stenosis, lumbar region without neurogenic claudication: Secondary | ICD-10-CM | POA: Diagnosis not present

## 2013-03-22 DIAGNOSIS — M47817 Spondylosis without myelopathy or radiculopathy, lumbosacral region: Secondary | ICD-10-CM | POA: Diagnosis not present

## 2013-03-31 DIAGNOSIS — H3530 Unspecified macular degeneration: Secondary | ICD-10-CM | POA: Diagnosis not present

## 2013-03-31 DIAGNOSIS — H40059 Ocular hypertension, unspecified eye: Secondary | ICD-10-CM | POA: Diagnosis not present

## 2013-04-15 DIAGNOSIS — M25569 Pain in unspecified knee: Secondary | ICD-10-CM | POA: Diagnosis not present

## 2013-05-02 DIAGNOSIS — N644 Mastodynia: Secondary | ICD-10-CM | POA: Diagnosis not present

## 2013-05-06 DIAGNOSIS — M25569 Pain in unspecified knee: Secondary | ICD-10-CM | POA: Diagnosis not present

## 2013-05-10 DIAGNOSIS — N63 Unspecified lump in unspecified breast: Secondary | ICD-10-CM | POA: Diagnosis not present

## 2013-05-10 DIAGNOSIS — Z803 Family history of malignant neoplasm of breast: Secondary | ICD-10-CM | POA: Diagnosis not present

## 2013-06-23 DIAGNOSIS — F329 Major depressive disorder, single episode, unspecified: Secondary | ICD-10-CM | POA: Diagnosis not present

## 2013-06-23 DIAGNOSIS — Z79899 Other long term (current) drug therapy: Secondary | ICD-10-CM | POA: Diagnosis not present

## 2013-06-23 DIAGNOSIS — Z0000 Encounter for general adult medical examination without abnormal findings: Secondary | ICD-10-CM | POA: Diagnosis not present

## 2013-06-23 DIAGNOSIS — R3911 Hesitancy of micturition: Secondary | ICD-10-CM | POA: Diagnosis not present

## 2013-06-23 DIAGNOSIS — M25559 Pain in unspecified hip: Secondary | ICD-10-CM | POA: Diagnosis not present

## 2013-06-23 DIAGNOSIS — E782 Mixed hyperlipidemia: Secondary | ICD-10-CM | POA: Diagnosis not present

## 2013-07-28 DIAGNOSIS — R3911 Hesitancy of micturition: Secondary | ICD-10-CM | POA: Diagnosis not present

## 2013-07-28 DIAGNOSIS — Z79899 Other long term (current) drug therapy: Secondary | ICD-10-CM | POA: Diagnosis not present

## 2013-07-28 DIAGNOSIS — E782 Mixed hyperlipidemia: Secondary | ICD-10-CM | POA: Diagnosis not present

## 2013-08-29 DIAGNOSIS — IMO0002 Reserved for concepts with insufficient information to code with codable children: Secondary | ICD-10-CM | POA: Diagnosis not present

## 2013-09-01 DIAGNOSIS — M25569 Pain in unspecified knee: Secondary | ICD-10-CM | POA: Diagnosis not present

## 2013-09-14 ENCOUNTER — Other Ambulatory Visit: Payer: Self-pay | Admitting: Obstetrics and Gynecology

## 2013-09-14 DIAGNOSIS — N952 Postmenopausal atrophic vaginitis: Secondary | ICD-10-CM | POA: Diagnosis not present

## 2013-09-14 DIAGNOSIS — Z1151 Encounter for screening for human papillomavirus (HPV): Secondary | ICD-10-CM | POA: Insufficient documentation

## 2013-09-14 DIAGNOSIS — R87619 Unspecified abnormal cytological findings in specimens from cervix uteri: Secondary | ICD-10-CM | POA: Diagnosis not present

## 2013-09-14 DIAGNOSIS — N362 Urethral caruncle: Secondary | ICD-10-CM | POA: Diagnosis not present

## 2013-09-14 DIAGNOSIS — Z124 Encounter for screening for malignant neoplasm of cervix: Secondary | ICD-10-CM | POA: Diagnosis not present

## 2013-09-14 DIAGNOSIS — C549 Malignant neoplasm of corpus uteri, unspecified: Secondary | ICD-10-CM | POA: Diagnosis not present

## 2013-09-14 DIAGNOSIS — Z01419 Encounter for gynecological examination (general) (routine) without abnormal findings: Secondary | ICD-10-CM | POA: Diagnosis not present

## 2013-09-14 DIAGNOSIS — N644 Mastodynia: Secondary | ICD-10-CM | POA: Diagnosis not present

## 2013-09-15 DIAGNOSIS — Z23 Encounter for immunization: Secondary | ICD-10-CM | POA: Diagnosis not present

## 2013-09-16 DIAGNOSIS — N644 Mastodynia: Secondary | ICD-10-CM | POA: Diagnosis not present

## 2013-09-16 DIAGNOSIS — N6459 Other signs and symptoms in breast: Secondary | ICD-10-CM | POA: Diagnosis not present

## 2013-09-19 ENCOUNTER — Other Ambulatory Visit (HOSPITAL_COMMUNITY)
Admission: RE | Admit: 2013-09-19 | Discharge: 2013-09-19 | Disposition: A | Discharge status: Home / Self Care | Payer: Medicare Other | Source: Ambulatory Visit | Attending: Obstetrics and Gynecology | Admitting: Obstetrics and Gynecology

## 2013-09-28 DIAGNOSIS — M545 Low back pain: Secondary | ICD-10-CM | POA: Diagnosis not present

## 2013-09-28 DIAGNOSIS — R293 Abnormal posture: Secondary | ICD-10-CM | POA: Diagnosis not present

## 2013-09-29 DIAGNOSIS — M25569 Pain in unspecified knee: Secondary | ICD-10-CM | POA: Diagnosis not present

## 2013-09-30 DIAGNOSIS — R293 Abnormal posture: Secondary | ICD-10-CM | POA: Diagnosis not present

## 2013-09-30 DIAGNOSIS — M545 Low back pain: Secondary | ICD-10-CM | POA: Diagnosis not present

## 2013-10-03 DIAGNOSIS — M545 Low back pain: Secondary | ICD-10-CM | POA: Diagnosis not present

## 2013-10-03 DIAGNOSIS — R293 Abnormal posture: Secondary | ICD-10-CM | POA: Diagnosis not present

## 2013-10-05 DIAGNOSIS — M545 Low back pain: Secondary | ICD-10-CM | POA: Diagnosis not present

## 2013-10-05 DIAGNOSIS — R293 Abnormal posture: Secondary | ICD-10-CM | POA: Diagnosis not present

## 2013-10-07 DIAGNOSIS — M545 Low back pain: Secondary | ICD-10-CM | POA: Diagnosis not present

## 2013-10-07 DIAGNOSIS — R293 Abnormal posture: Secondary | ICD-10-CM | POA: Diagnosis not present

## 2013-10-10 DIAGNOSIS — M545 Low back pain: Secondary | ICD-10-CM | POA: Diagnosis not present

## 2013-10-10 DIAGNOSIS — R293 Abnormal posture: Secondary | ICD-10-CM | POA: Diagnosis not present

## 2013-10-12 DIAGNOSIS — M545 Low back pain: Secondary | ICD-10-CM | POA: Diagnosis not present

## 2013-10-12 DIAGNOSIS — R293 Abnormal posture: Secondary | ICD-10-CM | POA: Diagnosis not present

## 2013-10-13 DIAGNOSIS — H40059 Ocular hypertension, unspecified eye: Secondary | ICD-10-CM | POA: Diagnosis not present

## 2013-10-13 DIAGNOSIS — H3530 Unspecified macular degeneration: Secondary | ICD-10-CM | POA: Diagnosis not present

## 2013-10-14 DIAGNOSIS — R293 Abnormal posture: Secondary | ICD-10-CM | POA: Diagnosis not present

## 2013-10-14 DIAGNOSIS — M545 Low back pain: Secondary | ICD-10-CM | POA: Diagnosis not present

## 2013-10-17 DIAGNOSIS — R293 Abnormal posture: Secondary | ICD-10-CM | POA: Diagnosis not present

## 2013-10-17 DIAGNOSIS — M545 Low back pain: Secondary | ICD-10-CM | POA: Diagnosis not present

## 2013-10-20 DIAGNOSIS — M545 Low back pain: Secondary | ICD-10-CM | POA: Diagnosis not present

## 2013-10-20 DIAGNOSIS — R293 Abnormal posture: Secondary | ICD-10-CM | POA: Diagnosis not present

## 2013-10-21 DIAGNOSIS — L259 Unspecified contact dermatitis, unspecified cause: Secondary | ICD-10-CM | POA: Diagnosis not present

## 2013-10-21 DIAGNOSIS — L819 Disorder of pigmentation, unspecified: Secondary | ICD-10-CM | POA: Diagnosis not present

## 2013-10-21 DIAGNOSIS — R293 Abnormal posture: Secondary | ICD-10-CM | POA: Diagnosis not present

## 2013-10-21 DIAGNOSIS — M545 Low back pain: Secondary | ICD-10-CM | POA: Diagnosis not present

## 2013-10-24 DIAGNOSIS — R293 Abnormal posture: Secondary | ICD-10-CM | POA: Diagnosis not present

## 2013-10-24 DIAGNOSIS — M545 Low back pain: Secondary | ICD-10-CM | POA: Diagnosis not present

## 2013-10-26 DIAGNOSIS — R293 Abnormal posture: Secondary | ICD-10-CM | POA: Diagnosis not present

## 2013-10-26 DIAGNOSIS — M545 Low back pain: Secondary | ICD-10-CM | POA: Diagnosis not present

## 2013-10-27 DIAGNOSIS — M25579 Pain in unspecified ankle and joints of unspecified foot: Secondary | ICD-10-CM | POA: Diagnosis not present

## 2013-10-28 DIAGNOSIS — M545 Low back pain: Secondary | ICD-10-CM | POA: Diagnosis not present

## 2013-10-28 DIAGNOSIS — R293 Abnormal posture: Secondary | ICD-10-CM | POA: Diagnosis not present

## 2013-10-31 DIAGNOSIS — M545 Low back pain: Secondary | ICD-10-CM | POA: Diagnosis not present

## 2013-10-31 DIAGNOSIS — R293 Abnormal posture: Secondary | ICD-10-CM | POA: Diagnosis not present

## 2013-11-03 DIAGNOSIS — R293 Abnormal posture: Secondary | ICD-10-CM | POA: Diagnosis not present

## 2013-11-03 DIAGNOSIS — M545 Low back pain: Secondary | ICD-10-CM | POA: Diagnosis not present

## 2013-11-08 DIAGNOSIS — M545 Low back pain: Secondary | ICD-10-CM | POA: Diagnosis not present

## 2013-11-08 DIAGNOSIS — R293 Abnormal posture: Secondary | ICD-10-CM | POA: Diagnosis not present

## 2013-11-11 DIAGNOSIS — M545 Low back pain: Secondary | ICD-10-CM | POA: Diagnosis not present

## 2013-11-11 DIAGNOSIS — R293 Abnormal posture: Secondary | ICD-10-CM | POA: Diagnosis not present

## 2013-11-15 DIAGNOSIS — M545 Low back pain: Secondary | ICD-10-CM | POA: Diagnosis not present

## 2013-11-15 DIAGNOSIS — R293 Abnormal posture: Secondary | ICD-10-CM | POA: Diagnosis not present

## 2013-11-18 DIAGNOSIS — R293 Abnormal posture: Secondary | ICD-10-CM | POA: Diagnosis not present

## 2013-11-18 DIAGNOSIS — M545 Low back pain: Secondary | ICD-10-CM | POA: Diagnosis not present

## 2013-11-21 ENCOUNTER — Ambulatory Visit
Admission: RE | Admit: 2013-11-21 | Discharge: 2013-11-21 | Disposition: A | Discharge status: Home / Self Care | Payer: Medicare Other | Source: Ambulatory Visit | Attending: Orthopedic Surgery | Admitting: Orthopedic Surgery

## 2013-11-21 ENCOUNTER — Other Ambulatory Visit: Payer: Self-pay | Admitting: Orthopedic Surgery

## 2013-11-21 DIAGNOSIS — R609 Edema, unspecified: Secondary | ICD-10-CM

## 2013-11-21 DIAGNOSIS — R52 Pain, unspecified: Secondary | ICD-10-CM

## 2013-11-21 DIAGNOSIS — M79609 Pain in unspecified limb: Secondary | ICD-10-CM | POA: Diagnosis not present

## 2013-11-21 DIAGNOSIS — M7989 Other specified soft tissue disorders: Secondary | ICD-10-CM | POA: Diagnosis not present

## 2013-11-21 DIAGNOSIS — M545 Low back pain: Secondary | ICD-10-CM | POA: Diagnosis not present

## 2013-11-21 DIAGNOSIS — R293 Abnormal posture: Secondary | ICD-10-CM | POA: Diagnosis not present

## 2013-11-21 DIAGNOSIS — M1710 Unilateral primary osteoarthritis, unspecified knee: Secondary | ICD-10-CM | POA: Diagnosis not present

## 2013-11-23 DIAGNOSIS — E782 Mixed hyperlipidemia: Secondary | ICD-10-CM | POA: Diagnosis not present

## 2013-11-23 DIAGNOSIS — M545 Low back pain: Secondary | ICD-10-CM | POA: Diagnosis not present

## 2013-11-23 DIAGNOSIS — E7800 Pure hypercholesterolemia, unspecified: Secondary | ICD-10-CM | POA: Diagnosis not present

## 2013-11-23 DIAGNOSIS — R293 Abnormal posture: Secondary | ICD-10-CM | POA: Diagnosis not present

## 2013-11-23 DIAGNOSIS — R609 Edema, unspecified: Secondary | ICD-10-CM | POA: Diagnosis not present

## 2013-11-29 DIAGNOSIS — R293 Abnormal posture: Secondary | ICD-10-CM | POA: Diagnosis not present

## 2013-11-29 DIAGNOSIS — M545 Low back pain: Secondary | ICD-10-CM | POA: Diagnosis not present

## 2013-12-05 DIAGNOSIS — M545 Low back pain: Secondary | ICD-10-CM | POA: Diagnosis not present

## 2013-12-05 DIAGNOSIS — R293 Abnormal posture: Secondary | ICD-10-CM | POA: Diagnosis not present

## 2014-01-12 DIAGNOSIS — M5430 Sciatica, unspecified side: Secondary | ICD-10-CM | POA: Diagnosis not present

## 2014-01-12 DIAGNOSIS — M5137 Other intervertebral disc degeneration, lumbosacral region: Secondary | ICD-10-CM | POA: Diagnosis not present

## 2014-01-12 DIAGNOSIS — M5106 Intervertebral disc disorders with myelopathy, lumbar region: Secondary | ICD-10-CM | POA: Diagnosis not present

## 2014-01-12 DIAGNOSIS — M999 Biomechanical lesion, unspecified: Secondary | ICD-10-CM | POA: Diagnosis not present

## 2014-01-16 DIAGNOSIS — M5137 Other intervertebral disc degeneration, lumbosacral region: Secondary | ICD-10-CM | POA: Diagnosis not present

## 2014-01-16 DIAGNOSIS — M5430 Sciatica, unspecified side: Secondary | ICD-10-CM | POA: Diagnosis not present

## 2014-01-16 DIAGNOSIS — M999 Biomechanical lesion, unspecified: Secondary | ICD-10-CM | POA: Diagnosis not present

## 2014-01-16 DIAGNOSIS — M5106 Intervertebral disc disorders with myelopathy, lumbar region: Secondary | ICD-10-CM | POA: Diagnosis not present

## 2014-01-18 DIAGNOSIS — M5137 Other intervertebral disc degeneration, lumbosacral region: Secondary | ICD-10-CM | POA: Diagnosis not present

## 2014-01-18 DIAGNOSIS — M5430 Sciatica, unspecified side: Secondary | ICD-10-CM | POA: Diagnosis not present

## 2014-01-18 DIAGNOSIS — M5106 Intervertebral disc disorders with myelopathy, lumbar region: Secondary | ICD-10-CM | POA: Diagnosis not present

## 2014-01-18 DIAGNOSIS — M999 Biomechanical lesion, unspecified: Secondary | ICD-10-CM | POA: Diagnosis not present

## 2014-01-23 DIAGNOSIS — M999 Biomechanical lesion, unspecified: Secondary | ICD-10-CM | POA: Diagnosis not present

## 2014-01-23 DIAGNOSIS — M5106 Intervertebral disc disorders with myelopathy, lumbar region: Secondary | ICD-10-CM | POA: Diagnosis not present

## 2014-01-23 DIAGNOSIS — M5430 Sciatica, unspecified side: Secondary | ICD-10-CM | POA: Diagnosis not present

## 2014-01-23 DIAGNOSIS — M5137 Other intervertebral disc degeneration, lumbosacral region: Secondary | ICD-10-CM | POA: Diagnosis not present

## 2014-01-25 DIAGNOSIS — M5430 Sciatica, unspecified side: Secondary | ICD-10-CM | POA: Diagnosis not present

## 2014-01-25 DIAGNOSIS — M5137 Other intervertebral disc degeneration, lumbosacral region: Secondary | ICD-10-CM | POA: Diagnosis not present

## 2014-01-25 DIAGNOSIS — M999 Biomechanical lesion, unspecified: Secondary | ICD-10-CM | POA: Diagnosis not present

## 2014-01-25 DIAGNOSIS — M5106 Intervertebral disc disorders with myelopathy, lumbar region: Secondary | ICD-10-CM | POA: Diagnosis not present

## 2014-01-30 DIAGNOSIS — M5106 Intervertebral disc disorders with myelopathy, lumbar region: Secondary | ICD-10-CM | POA: Diagnosis not present

## 2014-01-30 DIAGNOSIS — M5137 Other intervertebral disc degeneration, lumbosacral region: Secondary | ICD-10-CM | POA: Diagnosis not present

## 2014-01-30 DIAGNOSIS — M999 Biomechanical lesion, unspecified: Secondary | ICD-10-CM | POA: Diagnosis not present

## 2014-01-30 DIAGNOSIS — M5430 Sciatica, unspecified side: Secondary | ICD-10-CM | POA: Diagnosis not present

## 2014-02-06 DIAGNOSIS — M5430 Sciatica, unspecified side: Secondary | ICD-10-CM | POA: Diagnosis not present

## 2014-02-06 DIAGNOSIS — M5137 Other intervertebral disc degeneration, lumbosacral region: Secondary | ICD-10-CM | POA: Diagnosis not present

## 2014-02-06 DIAGNOSIS — M999 Biomechanical lesion, unspecified: Secondary | ICD-10-CM | POA: Diagnosis not present

## 2014-02-06 DIAGNOSIS — M5106 Intervertebral disc disorders with myelopathy, lumbar region: Secondary | ICD-10-CM | POA: Diagnosis not present

## 2014-02-08 DIAGNOSIS — M5106 Intervertebral disc disorders with myelopathy, lumbar region: Secondary | ICD-10-CM | POA: Diagnosis not present

## 2014-02-08 DIAGNOSIS — M5137 Other intervertebral disc degeneration, lumbosacral region: Secondary | ICD-10-CM | POA: Diagnosis not present

## 2014-02-08 DIAGNOSIS — M5430 Sciatica, unspecified side: Secondary | ICD-10-CM | POA: Diagnosis not present

## 2014-02-08 DIAGNOSIS — M999 Biomechanical lesion, unspecified: Secondary | ICD-10-CM | POA: Diagnosis not present

## 2014-02-13 DIAGNOSIS — M5106 Intervertebral disc disorders with myelopathy, lumbar region: Secondary | ICD-10-CM | POA: Diagnosis not present

## 2014-02-13 DIAGNOSIS — M999 Biomechanical lesion, unspecified: Secondary | ICD-10-CM | POA: Diagnosis not present

## 2014-02-13 DIAGNOSIS — M5430 Sciatica, unspecified side: Secondary | ICD-10-CM | POA: Diagnosis not present

## 2014-02-13 DIAGNOSIS — IMO0002 Reserved for concepts with insufficient information to code with codable children: Secondary | ICD-10-CM | POA: Diagnosis not present

## 2014-02-13 DIAGNOSIS — M5137 Other intervertebral disc degeneration, lumbosacral region: Secondary | ICD-10-CM | POA: Diagnosis not present

## 2014-02-20 DIAGNOSIS — M5430 Sciatica, unspecified side: Secondary | ICD-10-CM | POA: Diagnosis not present

## 2014-02-20 DIAGNOSIS — M5137 Other intervertebral disc degeneration, lumbosacral region: Secondary | ICD-10-CM | POA: Diagnosis not present

## 2014-02-20 DIAGNOSIS — M5106 Intervertebral disc disorders with myelopathy, lumbar region: Secondary | ICD-10-CM | POA: Diagnosis not present

## 2014-02-20 DIAGNOSIS — M999 Biomechanical lesion, unspecified: Secondary | ICD-10-CM | POA: Diagnosis not present

## 2014-02-27 DIAGNOSIS — M5137 Other intervertebral disc degeneration, lumbosacral region: Secondary | ICD-10-CM | POA: Diagnosis not present

## 2014-02-27 DIAGNOSIS — M5106 Intervertebral disc disorders with myelopathy, lumbar region: Secondary | ICD-10-CM | POA: Diagnosis not present

## 2014-02-27 DIAGNOSIS — M999 Biomechanical lesion, unspecified: Secondary | ICD-10-CM | POA: Diagnosis not present

## 2014-02-27 DIAGNOSIS — M5430 Sciatica, unspecified side: Secondary | ICD-10-CM | POA: Diagnosis not present

## 2014-03-14 DIAGNOSIS — IMO0002 Reserved for concepts with insufficient information to code with codable children: Secondary | ICD-10-CM | POA: Diagnosis not present

## 2014-04-06 DIAGNOSIS — M5137 Other intervertebral disc degeneration, lumbosacral region: Secondary | ICD-10-CM | POA: Diagnosis not present

## 2014-04-06 DIAGNOSIS — M5106 Intervertebral disc disorders with myelopathy, lumbar region: Secondary | ICD-10-CM | POA: Diagnosis not present

## 2014-04-06 DIAGNOSIS — M999 Biomechanical lesion, unspecified: Secondary | ICD-10-CM | POA: Diagnosis not present

## 2014-04-06 DIAGNOSIS — M5430 Sciatica, unspecified side: Secondary | ICD-10-CM | POA: Diagnosis not present

## 2014-04-10 DIAGNOSIS — M5137 Other intervertebral disc degeneration, lumbosacral region: Secondary | ICD-10-CM | POA: Diagnosis not present

## 2014-04-10 DIAGNOSIS — M999 Biomechanical lesion, unspecified: Secondary | ICD-10-CM | POA: Diagnosis not present

## 2014-04-10 DIAGNOSIS — M5106 Intervertebral disc disorders with myelopathy, lumbar region: Secondary | ICD-10-CM | POA: Diagnosis not present

## 2014-04-10 DIAGNOSIS — M5430 Sciatica, unspecified side: Secondary | ICD-10-CM | POA: Diagnosis not present

## 2014-04-12 DIAGNOSIS — I872 Venous insufficiency (chronic) (peripheral): Secondary | ICD-10-CM | POA: Diagnosis not present

## 2014-04-13 DIAGNOSIS — M5430 Sciatica, unspecified side: Secondary | ICD-10-CM | POA: Diagnosis not present

## 2014-04-13 DIAGNOSIS — M999 Biomechanical lesion, unspecified: Secondary | ICD-10-CM | POA: Diagnosis not present

## 2014-04-13 DIAGNOSIS — M5137 Other intervertebral disc degeneration, lumbosacral region: Secondary | ICD-10-CM | POA: Diagnosis not present

## 2014-04-13 DIAGNOSIS — H40059 Ocular hypertension, unspecified eye: Secondary | ICD-10-CM | POA: Diagnosis not present

## 2014-04-13 DIAGNOSIS — M5106 Intervertebral disc disorders with myelopathy, lumbar region: Secondary | ICD-10-CM | POA: Diagnosis not present

## 2014-04-17 DIAGNOSIS — M5430 Sciatica, unspecified side: Secondary | ICD-10-CM | POA: Diagnosis not present

## 2014-04-17 DIAGNOSIS — M5106 Intervertebral disc disorders with myelopathy, lumbar region: Secondary | ICD-10-CM | POA: Diagnosis not present

## 2014-04-17 DIAGNOSIS — M5137 Other intervertebral disc degeneration, lumbosacral region: Secondary | ICD-10-CM | POA: Diagnosis not present

## 2014-04-17 DIAGNOSIS — M999 Biomechanical lesion, unspecified: Secondary | ICD-10-CM | POA: Diagnosis not present

## 2014-05-09 DIAGNOSIS — M999 Biomechanical lesion, unspecified: Secondary | ICD-10-CM | POA: Diagnosis not present

## 2014-05-09 DIAGNOSIS — M5106 Intervertebral disc disorders with myelopathy, lumbar region: Secondary | ICD-10-CM | POA: Diagnosis not present

## 2014-05-09 DIAGNOSIS — M5137 Other intervertebral disc degeneration, lumbosacral region: Secondary | ICD-10-CM | POA: Diagnosis not present

## 2014-05-09 DIAGNOSIS — M5430 Sciatica, unspecified side: Secondary | ICD-10-CM | POA: Diagnosis not present

## 2014-05-16 DIAGNOSIS — L723 Sebaceous cyst: Secondary | ICD-10-CM | POA: Diagnosis not present

## 2014-05-16 DIAGNOSIS — L821 Other seborrheic keratosis: Secondary | ICD-10-CM | POA: Diagnosis not present

## 2014-05-16 DIAGNOSIS — L820 Inflamed seborrheic keratosis: Secondary | ICD-10-CM | POA: Diagnosis not present

## 2014-05-16 DIAGNOSIS — D239 Other benign neoplasm of skin, unspecified: Secondary | ICD-10-CM | POA: Diagnosis not present

## 2014-05-16 DIAGNOSIS — D235 Other benign neoplasm of skin of trunk: Secondary | ICD-10-CM | POA: Diagnosis not present

## 2014-05-16 DIAGNOSIS — L259 Unspecified contact dermatitis, unspecified cause: Secondary | ICD-10-CM | POA: Diagnosis not present

## 2014-06-27 DIAGNOSIS — E7800 Pure hypercholesterolemia, unspecified: Secondary | ICD-10-CM | POA: Diagnosis not present

## 2014-06-27 DIAGNOSIS — Z0000 Encounter for general adult medical examination without abnormal findings: Secondary | ICD-10-CM | POA: Diagnosis not present

## 2014-06-27 DIAGNOSIS — Z1331 Encounter for screening for depression: Secondary | ICD-10-CM | POA: Diagnosis not present

## 2014-06-27 DIAGNOSIS — M549 Dorsalgia, unspecified: Secondary | ICD-10-CM | POA: Diagnosis not present

## 2014-06-27 DIAGNOSIS — I872 Venous insufficiency (chronic) (peripheral): Secondary | ICD-10-CM | POA: Diagnosis not present

## 2014-06-27 DIAGNOSIS — Z79899 Other long term (current) drug therapy: Secondary | ICD-10-CM | POA: Diagnosis not present

## 2014-06-27 DIAGNOSIS — N183 Chronic kidney disease, stage 3 unspecified: Secondary | ICD-10-CM | POA: Diagnosis not present

## 2014-06-27 DIAGNOSIS — Z23 Encounter for immunization: Secondary | ICD-10-CM | POA: Diagnosis not present

## 2014-07-03 DIAGNOSIS — M25559 Pain in unspecified hip: Secondary | ICD-10-CM | POA: Diagnosis not present

## 2014-07-08 DIAGNOSIS — M47817 Spondylosis without myelopathy or radiculopathy, lumbosacral region: Secondary | ICD-10-CM | POA: Diagnosis not present

## 2014-07-18 DIAGNOSIS — M47817 Spondylosis without myelopathy or radiculopathy, lumbosacral region: Secondary | ICD-10-CM | POA: Diagnosis not present

## 2014-07-20 ENCOUNTER — Other Ambulatory Visit: Payer: Self-pay | Admitting: Orthopedic Surgery

## 2014-07-20 DIAGNOSIS — M545 Low back pain, unspecified: Secondary | ICD-10-CM

## 2014-07-20 DIAGNOSIS — M5126 Other intervertebral disc displacement, lumbar region: Secondary | ICD-10-CM

## 2014-07-25 ENCOUNTER — Ambulatory Visit
Admission: RE | Admit: 2014-07-25 | Discharge: 2014-07-25 | Disposition: A | Discharge status: Home / Self Care | Payer: Medicare Other | Source: Ambulatory Visit | Attending: Orthopedic Surgery | Admitting: Orthopedic Surgery

## 2014-07-25 DIAGNOSIS — M545 Low back pain, unspecified: Secondary | ICD-10-CM | POA: Diagnosis not present

## 2014-07-25 DIAGNOSIS — M5126 Other intervertebral disc displacement, lumbar region: Secondary | ICD-10-CM

## 2014-07-25 MED ORDER — METHYLPREDNISOLONE ACETATE 40 MG/ML INJ SUSP (RADIOLOG
120.0000 mg | Freq: Once | INTRAMUSCULAR | Status: AC
  Administered 2014-07-25: 120 mg via EPIDURAL

## 2014-07-25 MED ORDER — IOHEXOL 180 MG/ML  SOLN
1.0000 mL | Freq: Once | INTRAMUSCULAR | Status: AC | PRN
  Administered 2014-07-25: 1 mL via EPIDURAL

## 2014-07-25 NOTE — Discharge Instructions (Signed)

## 2014-08-03 DIAGNOSIS — M5106 Intervertebral disc disorders with myelopathy, lumbar region: Secondary | ICD-10-CM | POA: Diagnosis not present

## 2014-08-11 DIAGNOSIS — M6281 Muscle weakness (generalized): Secondary | ICD-10-CM | POA: Diagnosis not present

## 2014-08-11 DIAGNOSIS — R269 Unspecified abnormalities of gait and mobility: Secondary | ICD-10-CM | POA: Diagnosis not present

## 2014-08-15 DIAGNOSIS — M6281 Muscle weakness (generalized): Secondary | ICD-10-CM | POA: Diagnosis not present

## 2014-08-18 DIAGNOSIS — M6281 Muscle weakness (generalized): Secondary | ICD-10-CM | POA: Diagnosis not present

## 2014-08-23 DIAGNOSIS — M5106 Intervertebral disc disorders with myelopathy, lumbar region: Secondary | ICD-10-CM | POA: Diagnosis not present

## 2014-08-24 DIAGNOSIS — M6281 Muscle weakness (generalized): Secondary | ICD-10-CM | POA: Diagnosis not present

## 2014-08-29 DIAGNOSIS — IMO0002 Reserved for concepts with insufficient information to code with codable children: Secondary | ICD-10-CM | POA: Diagnosis not present

## 2014-08-30 DIAGNOSIS — M6281 Muscle weakness (generalized): Secondary | ICD-10-CM | POA: Diagnosis not present

## 2014-09-08 DIAGNOSIS — M6281 Muscle weakness (generalized): Secondary | ICD-10-CM | POA: Diagnosis not present

## 2014-09-13 DIAGNOSIS — M6281 Muscle weakness (generalized): Secondary | ICD-10-CM | POA: Diagnosis not present

## 2014-09-15 DIAGNOSIS — R2689 Other abnormalities of gait and mobility: Secondary | ICD-10-CM | POA: Diagnosis not present

## 2014-09-15 DIAGNOSIS — M6281 Muscle weakness (generalized): Secondary | ICD-10-CM | POA: Diagnosis not present

## 2014-09-19 DIAGNOSIS — M6281 Muscle weakness (generalized): Secondary | ICD-10-CM | POA: Diagnosis not present

## 2014-09-19 DIAGNOSIS — R2689 Other abnormalities of gait and mobility: Secondary | ICD-10-CM | POA: Diagnosis not present

## 2014-09-20 DIAGNOSIS — M6281 Muscle weakness (generalized): Secondary | ICD-10-CM | POA: Diagnosis not present

## 2014-09-20 DIAGNOSIS — R2689 Other abnormalities of gait and mobility: Secondary | ICD-10-CM | POA: Diagnosis not present

## 2014-09-20 DIAGNOSIS — M5106 Intervertebral disc disorders with myelopathy, lumbar region: Secondary | ICD-10-CM | POA: Diagnosis not present

## 2014-09-22 DIAGNOSIS — M6281 Muscle weakness (generalized): Secondary | ICD-10-CM | POA: Diagnosis not present

## 2014-09-22 DIAGNOSIS — R2689 Other abnormalities of gait and mobility: Secondary | ICD-10-CM | POA: Diagnosis not present

## 2014-09-25 ENCOUNTER — Other Ambulatory Visit (HOSPITAL_COMMUNITY)
Admission: RE | Admit: 2014-09-25 | Discharge: 2014-09-25 | Disposition: A | Discharge status: Home / Self Care | Payer: Medicare Other | Source: Ambulatory Visit | Attending: Obstetrics and Gynecology | Admitting: Obstetrics and Gynecology

## 2014-09-25 ENCOUNTER — Other Ambulatory Visit: Payer: Self-pay | Admitting: Obstetrics and Gynecology

## 2014-09-25 DIAGNOSIS — Z9189 Other specified personal risk factors, not elsewhere classified: Secondary | ICD-10-CM | POA: Diagnosis not present

## 2014-09-25 DIAGNOSIS — Z01411 Encounter for gynecological examination (general) (routine) with abnormal findings: Secondary | ICD-10-CM | POA: Diagnosis not present

## 2014-09-25 DIAGNOSIS — C549 Malignant neoplasm of corpus uteri, unspecified: Secondary | ICD-10-CM | POA: Diagnosis not present

## 2014-09-25 DIAGNOSIS — N362 Urethral caruncle: Secondary | ICD-10-CM | POA: Diagnosis not present

## 2014-09-26 LAB — CYTOLOGY - PAP

## 2014-09-29 DIAGNOSIS — M6281 Muscle weakness (generalized): Secondary | ICD-10-CM | POA: Diagnosis not present

## 2014-09-29 DIAGNOSIS — R2689 Other abnormalities of gait and mobility: Secondary | ICD-10-CM | POA: Diagnosis not present

## 2014-09-30 DIAGNOSIS — Z23 Encounter for immunization: Secondary | ICD-10-CM | POA: Diagnosis not present

## 2014-10-03 DIAGNOSIS — R2689 Other abnormalities of gait and mobility: Secondary | ICD-10-CM | POA: Diagnosis not present

## 2014-10-03 DIAGNOSIS — M6281 Muscle weakness (generalized): Secondary | ICD-10-CM | POA: Diagnosis not present

## 2014-10-04 DIAGNOSIS — R2689 Other abnormalities of gait and mobility: Secondary | ICD-10-CM | POA: Diagnosis not present

## 2014-10-04 DIAGNOSIS — M6281 Muscle weakness (generalized): Secondary | ICD-10-CM | POA: Diagnosis not present

## 2014-10-09 DIAGNOSIS — H40053 Ocular hypertension, bilateral: Secondary | ICD-10-CM | POA: Diagnosis not present

## 2014-10-09 DIAGNOSIS — H2513 Age-related nuclear cataract, bilateral: Secondary | ICD-10-CM | POA: Diagnosis not present

## 2014-10-12 DIAGNOSIS — M47812 Spondylosis without myelopathy or radiculopathy, cervical region: Secondary | ICD-10-CM | POA: Diagnosis not present

## 2014-10-13 DIAGNOSIS — M6281 Muscle weakness (generalized): Secondary | ICD-10-CM | POA: Diagnosis not present

## 2014-10-13 DIAGNOSIS — R2689 Other abnormalities of gait and mobility: Secondary | ICD-10-CM | POA: Diagnosis not present

## 2014-10-17 DIAGNOSIS — R2689 Other abnormalities of gait and mobility: Secondary | ICD-10-CM | POA: Diagnosis not present

## 2014-10-19 DIAGNOSIS — F324 Major depressive disorder, single episode, in partial remission: Secondary | ICD-10-CM | POA: Diagnosis not present

## 2014-10-20 DIAGNOSIS — R2689 Other abnormalities of gait and mobility: Secondary | ICD-10-CM | POA: Diagnosis not present

## 2014-10-24 DIAGNOSIS — M47812 Spondylosis without myelopathy or radiculopathy, cervical region: Secondary | ICD-10-CM | POA: Diagnosis not present

## 2014-10-24 DIAGNOSIS — M5106 Intervertebral disc disorders with myelopathy, lumbar region: Secondary | ICD-10-CM | POA: Diagnosis not present

## 2014-10-26 DIAGNOSIS — R2689 Other abnormalities of gait and mobility: Secondary | ICD-10-CM | POA: Diagnosis not present

## 2014-10-27 DIAGNOSIS — M542 Cervicalgia: Secondary | ICD-10-CM | POA: Diagnosis not present

## 2014-11-01 DIAGNOSIS — R2689 Other abnormalities of gait and mobility: Secondary | ICD-10-CM | POA: Diagnosis not present

## 2014-11-02 DIAGNOSIS — M47812 Spondylosis without myelopathy or radiculopathy, cervical region: Secondary | ICD-10-CM | POA: Diagnosis not present

## 2014-11-02 DIAGNOSIS — M542 Cervicalgia: Secondary | ICD-10-CM | POA: Diagnosis not present

## 2014-11-03 DIAGNOSIS — R2689 Other abnormalities of gait and mobility: Secondary | ICD-10-CM | POA: Diagnosis not present

## 2014-11-07 DIAGNOSIS — R2689 Other abnormalities of gait and mobility: Secondary | ICD-10-CM | POA: Diagnosis not present

## 2014-11-08 DIAGNOSIS — F324 Major depressive disorder, single episode, in partial remission: Secondary | ICD-10-CM | POA: Diagnosis not present

## 2014-11-08 DIAGNOSIS — R2689 Other abnormalities of gait and mobility: Secondary | ICD-10-CM | POA: Diagnosis not present

## 2014-11-16 ENCOUNTER — Ambulatory Visit (INDEPENDENT_AMBULATORY_CARE_PROVIDER_SITE_OTHER): Payer: Medicare Other | Admitting: Neurology

## 2014-11-16 ENCOUNTER — Encounter: Payer: Self-pay | Admitting: Neurology

## 2014-11-16 VITALS — BP 115/66 | HR 93 | Temp 96.9°F | Ht 63.5 in | Wt 131.0 lb

## 2014-11-16 DIAGNOSIS — R5383 Other fatigue: Secondary | ICD-10-CM

## 2014-11-16 DIAGNOSIS — R5381 Other malaise: Secondary | ICD-10-CM | POA: Diagnosis not present

## 2014-11-16 DIAGNOSIS — R29898 Other symptoms and signs involving the musculoskeletal system: Secondary | ICD-10-CM

## 2014-11-16 HISTORY — DX: Other symptoms and signs involving the musculoskeletal system: R29.898

## 2014-11-16 NOTE — Progress Notes (Signed)
Reason for visit: Dropped head syndrome  Janet [name] is a 80 y.o. female   Janet [name] is an 80 year old left-handed white female with problems with neck extension that began suddenly approximately one month ago. The patient indicates that the change occurred literally from one day to the next. The patient indicates that she does better in the morning, but as the day goes on, she fatigues with the neck, and it becomes more difficult to look forward. The patient denies any significant neck pain, but she does have a sensation of tightness in the neck and shoulders. The patient denies any discomfort down arms. She has undergone MRI evaluation of the cervical spine that shows moderate multilevel cervical degenerative changes, most prominent on the left side at the C3-4 level. There is a disc protrusion at the C4-5 level causing mild spinal stenosis. Mild-to-moderate spinal stenosis is seen at the C5-6 level and C6-7 level. The patient indicates no numbness of the extremities with exception that she does have some pain and numbness down the left leg associated with an L5 radiculopathy. The patient feels the left leg may be slightly weak because of this. She has not had a significant change in her balance, she reports some slight tremor associated with the use of Cymbalta. The patient denies any problems with swallowing or choking, no changes in speech. She is not having problems with ptosis of the eyes, double vision, or difficulty chewing. She denies issues controlling the bowels or the bladder. She comes to this office for an evaluation. The patient was on a cholesterol lowering agent, but she stopped the medication about 6 months ago.  Past Medical History  Diagnosis Date  . 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 Depression   . Cancer     uterine cancer  . Lumbar radicular syndrome     left L5  . Macular degeneration     left eye  . Dyslipidemia     Past Surgical History  Procedure Laterality Date    . Abdominal hysterectomy    . Tonsillectomy      Family History  Problem Relation Age of Onset  . Diabetes Father   . Heart attack Father   . Dementia Sister   . Pneumonia Brother     Social history:  reports that she has never smoked. She has never used smokeless tobacco. She reports that she does not drink alcohol. Her drug history is not on file.  Medications:  No current outpatient prescriptions on file prior to visit.   No current facility-administered medications on file prior to visit.      Allergies  Allergen Reactions  . Latex Itching  . Tramadol Other (See Comments)    Pt can't remember what side effects she had, she doesn't take it now    ROS:  Out of a complete 14 system review of symptoms, the patient complains only of the following symptoms, and all other reviewed systems are negative.  Swelling in the legs Ringing in the ears Achy muscles, skin sensitivity Numbness of the left leg Occasional tremor Depression, anxiety, not enough sleep  Blood pressure 115/66, pulse 93, temperature 96.9 F (36.1 C), temperature source Oral, height 5' 3.5" (1.613 m), weight 131 lb (59.421 kg).  Physical Exam  General: The patient is alert and cooperative at the time of the examination.  Eyes: Pupils are equal, round, and reactive to light. Discs are flat bilaterally.  Neck: The neck is supple, no carotid bruits are noted.  Respiratory: The respiratory examination is clear.  Cardiovascular: The cardiovascular examination reveals a regular rate and rhythm, no obvious murmurs or rubs are noted.  Skin: Extremities are without significant edema.  Neurologic Exam  Mental status: The patient is alert and oriented x 3 at the time of the examination. The patient has apparent normal recent and remote memory, with an apparently normal attention span and concentration ability.  Cranial nerves: Facial symmetry is present. There is good sensation of the face to pinprick  and soft touch bilaterally. The strength of the facial muscles and the muscles to head turning and shoulder shrug are normal bilaterally. Speech is well enunciated, no aphasia or dysarthria is noted. Extraocular movements are full. Visual fields are full. The tongue is midline, and the patient has symmetric elevation of the soft palate. No obvious hearing deficits are noted.  Motor: The motor testing reveals 5 over 5 strength of all 4 extremities. Good symmetric motor tone is noted throughout. The patient does have weakness with extension of the head, flexion appears to be normal strength, the patient has good strength with shoulder shrug.  Sensory: Sensory testing is intact to pinprick, soft touch, vibration sensation, and position sense on all 4 extremities. No evidence of extinction is noted.  Coordination: Cerebellar testing reveals good finger-nose-finger and heel-to-shin bilaterally.  Gait and station: Gait is normal. Tandem gait is unsteady. Romberg is negative. No drift is seen.  Reflexes: Deep tendon reflexes are symmetric and normal bilaterally, with exception that the ankle jerk reflexes are depressed bilaterally. Toes are downgoing bilaterally.   Assessment/Plan:  1. Dropped head syndrome  The patient has had a relatively rapid onset of a dropped head syndrome. This entity may be associated with a multitude of various diseases such as ALS, Parkinson's disease, PSP, cervical myelopathy, chronic inflammatory myositis, myasthenia gravis, Lambert-Eaton syndrome, or an isolated inflammatory axial myopathy. Some of the congenital myopathies may also be associated with this as well as the [facility] dystrophy. Electrolyte abnormalities and hypothyroidism may also cause dropped head syndrome. The patient will be sent for blood work today, and she will be set up for EMG evaluation of one arm, nerve conductions of both arms. Unfortunately, not all etiologies of dropped head syndrome are treatable. The  patient is using a soft cervical collar, but she finds it is uncomfortable to use. She will follow-up for the EMG evaluation.  Jill Alexanders MD 11/16/2014 8:33 PM  [facility] [address] [place], [place] [address]  Phone [phone] Fax [phone]

## 2014-11-16 NOTE — Patient Instructions (Signed)

## 2014-11-23 ENCOUNTER — Ambulatory Visit (INDEPENDENT_AMBULATORY_CARE_PROVIDER_SITE_OTHER): Payer: Self-pay | Admitting: Neurology

## 2014-11-23 ENCOUNTER — Ambulatory Visit (INDEPENDENT_AMBULATORY_CARE_PROVIDER_SITE_OTHER): Payer: Medicare Other | Admitting: Neurology

## 2014-11-23 DIAGNOSIS — R29898 Other symptoms and signs involving the musculoskeletal system: Secondary | ICD-10-CM | POA: Diagnosis not present

## 2014-11-23 LAB — VGCC ANTIBODY: VGCC ANTIBODY: NEGATIVE

## 2014-11-23 LAB — PARATHYROID HORMONE, INTACT (NO CA): PTH: 17 pg/mL (ref 15–65)

## 2014-11-23 NOTE — Procedures (Signed)
     HISTORY:  Normal Janet [name] is an 80 year old patient with a history of dropped head syndrome of relatively sudden onset. The patient is being evaluated for possible myopathic disorder as the etiology of this disease entity.  NERVE CONDUCTION STUDIES:  Nerve conduction studies were performed on both upper extremities. The distal motor latencies and motor amplitudes for the median and ulnar nerves were within normal limits. The F wave latencies and nerve conduction velocities for these nerves were also normal. The sensory latencies for the median and ulnar nerves were normal.   EMG STUDIES:  EMG study was performed on the left upper extremity:  The first dorsal interosseous muscle reveals 2 to 4 K units with full recruitment. No fibrillations or positive waves were noted. The abductor pollicis brevis muscle reveals 2 to 4 K units with full recruitment. No fibrillations or positive waves were noted. The extensor indicis proprius muscle reveals 1 to 3 K units with full recruitment. No fibrillations or positive waves were noted. The pronator teres muscle reveals 2 to 3 K units with full recruitment. No fibrillations or positive waves were noted. The biceps muscle reveals 1 to 2 K units with full recruitment. No fibrillations or positive waves were noted. The triceps muscle reveals 2 to 4 K units with full recruitment. No fibrillations or positive waves were noted. The anterior deltoid muscle reveals 2 to 3 K units with full recruitment. No fibrillations or positive waves were noted. The cervical paraspinal muscles were tested at 2 levels. One run of positive waves were seen in the lower cervical paraspinal muscles. With muscle contraction, there appear to be 0.2 to 0.5K motor units with early recruitment, polyphasic motor units seen. There was good relaxation.   IMPRESSION:  Nerve conduction studies done on both upper extremities were within normal limits. No evidence of a neuropathy is seen.  EMG evaluation of the left upper extremity is unremarkable, but there does appear to be evidence of myopathic motor units in the cervical paraspinal muscles. This study is consistent with a focal cervical myelopathy.  Jill Alexanders MD 11/23/2014 1:32 PM  [facility] [address] [place], [place] [address]  Phone [phone] Fax [phone]

## 2014-11-23 NOTE — Progress Notes (Signed)
Janet [name] is an 80 year old patient with a history of dropped head syndrome. The patient is being evaluated for the etiology of this problem.  Nerve conduction studies done on both arms were normal today. EMG of the left arm was normal, but there were myopathic units seen in the cervical paraspinal muscles.  The patient may have a focal cervical myopathy. The blood work is still pending. We need to exclude the possibility of myasthenia gravis. The patient will follow-up in 3 months.

## 2014-11-23 NOTE — Progress Notes (Signed)
Please refer to EMG note. 

## 2014-11-27 DIAGNOSIS — M546 Pain in thoracic spine: Secondary | ICD-10-CM | POA: Diagnosis not present

## 2014-11-27 DIAGNOSIS — M542 Cervicalgia: Secondary | ICD-10-CM | POA: Diagnosis not present

## 2014-11-27 DIAGNOSIS — M9902 Segmental and somatic dysfunction of thoracic region: Secondary | ICD-10-CM | POA: Diagnosis not present

## 2014-11-27 DIAGNOSIS — M4004 Postural kyphosis, thoracic region: Secondary | ICD-10-CM | POA: Diagnosis not present

## 2014-11-27 DIAGNOSIS — M5033 Other cervical disc degeneration, cervicothoracic region: Secondary | ICD-10-CM | POA: Diagnosis not present

## 2014-11-27 DIAGNOSIS — M9901 Segmental and somatic dysfunction of cervical region: Secondary | ICD-10-CM | POA: Diagnosis not present

## 2014-11-28 DIAGNOSIS — M9901 Segmental and somatic dysfunction of cervical region: Secondary | ICD-10-CM | POA: Diagnosis not present

## 2014-11-28 DIAGNOSIS — M546 Pain in thoracic spine: Secondary | ICD-10-CM | POA: Diagnosis not present

## 2014-11-28 DIAGNOSIS — M5033 Other cervical disc degeneration, cervicothoracic region: Secondary | ICD-10-CM | POA: Diagnosis not present

## 2014-11-28 DIAGNOSIS — M542 Cervicalgia: Secondary | ICD-10-CM | POA: Diagnosis not present

## 2014-11-28 DIAGNOSIS — M4004 Postural kyphosis, thoracic region: Secondary | ICD-10-CM | POA: Diagnosis not present

## 2014-11-28 DIAGNOSIS — M9902 Segmental and somatic dysfunction of thoracic region: Secondary | ICD-10-CM | POA: Diagnosis not present

## 2014-11-29 ENCOUNTER — Telehealth: Payer: Self-pay | Admitting: Neurology

## 2014-11-29 LAB — COMPREHENSIVE METABOLIC PANEL
A/G RATIO: 1.9 (ref 1.1–2.5)
ALK PHOS: 49 IU/L (ref 39–117)
ALT: 18 IU/L (ref 0–32)
AST: 23 IU/L (ref 0–40)
Albumin: 4.4 g/dL (ref 3.5–4.7)
BILIRUBIN TOTAL: 0.3 mg/dL (ref 0.0–1.2)
BUN / CREAT RATIO: 19 (ref 11–26)
BUN: 22 mg/dL (ref 8–27)
CO2: 24 mmol/L (ref 18–29)
CREATININE: 1.14 mg/dL — AB (ref 0.57–1.00)
Calcium: 9.6 mg/dL (ref 8.7–10.3)
Chloride: 100 mmol/L (ref 97–108)
GFR calc Af Amer: 52 mL/min/{1.73_m2} — ABNORMAL LOW (ref >59)
GFR, EST NON AFRICAN AMERICAN: 46 mL/min/{1.73_m2} — AB (ref >59)
GLOBULIN, TOTAL: 2.3 g/dL (ref 1.5–4.5)
Glucose: 100 mg/dL — ABNORMAL HIGH (ref 65–99)
Potassium: 4.9 mmol/L (ref 3.5–5.2)
SODIUM: 139 mmol/L (ref 134–144)
Total Protein: 6.7 g/dL (ref 6.0–8.5)

## 2014-11-29 LAB — TSH: TSH: 3.52 u[IU]/mL (ref 0.450–4.500)

## 2014-11-29 LAB — MAGNESIUM: MAGNESIUM: 2.4 mg/dL (ref 1.6–2.6)

## 2014-11-29 LAB — RHEUMATOID FACTOR: Rhuematoid fact SerPl-aCnc: 9.5 IU/mL (ref 0.0–13.9)

## 2014-11-29 LAB — ANGIOTENSIN CONVERTING ENZYME: Angio Convert Enzyme: 42 U/L (ref 14–82)

## 2014-11-29 LAB — SEDIMENTATION RATE: Sed Rate: 9 mm/hr (ref 0–40)

## 2014-11-29 LAB — ANA W/REFLEX: Anti Nuclear Antibody(ANA): NEGATIVE

## 2014-11-29 LAB — CK: CK TOTAL: 147 U/L (ref 24–173)

## 2014-11-29 LAB — ACETYLCHOLINE RECEPTOR, BINDING

## 2014-11-29 NOTE — Telephone Encounter (Signed)
I called the patient. The blood work was normal. The patient likely has an isolated cervical myopathy, have not been able to find a specific treatment for this in the literature. Treatment mainly involves support of the neck.

## 2014-11-30 DIAGNOSIS — M9901 Segmental and somatic dysfunction of cervical region: Secondary | ICD-10-CM | POA: Diagnosis not present

## 2014-11-30 DIAGNOSIS — M546 Pain in thoracic spine: Secondary | ICD-10-CM | POA: Diagnosis not present

## 2014-11-30 DIAGNOSIS — M9902 Segmental and somatic dysfunction of thoracic region: Secondary | ICD-10-CM | POA: Diagnosis not present

## 2014-11-30 DIAGNOSIS — M4004 Postural kyphosis, thoracic region: Secondary | ICD-10-CM | POA: Diagnosis not present

## 2014-11-30 DIAGNOSIS — M5033 Other cervical disc degeneration, cervicothoracic region: Secondary | ICD-10-CM | POA: Diagnosis not present

## 2014-11-30 DIAGNOSIS — M542 Cervicalgia: Secondary | ICD-10-CM | POA: Diagnosis not present

## 2014-12-04 DIAGNOSIS — M9901 Segmental and somatic dysfunction of cervical region: Secondary | ICD-10-CM | POA: Diagnosis not present

## 2014-12-04 DIAGNOSIS — M546 Pain in thoracic spine: Secondary | ICD-10-CM | POA: Diagnosis not present

## 2014-12-04 DIAGNOSIS — M5033 Other cervical disc degeneration, cervicothoracic region: Secondary | ICD-10-CM | POA: Diagnosis not present

## 2014-12-04 DIAGNOSIS — M9902 Segmental and somatic dysfunction of thoracic region: Secondary | ICD-10-CM | POA: Diagnosis not present

## 2014-12-04 DIAGNOSIS — M4004 Postural kyphosis, thoracic region: Secondary | ICD-10-CM | POA: Diagnosis not present

## 2014-12-04 DIAGNOSIS — M542 Cervicalgia: Secondary | ICD-10-CM | POA: Diagnosis not present

## 2014-12-13 DIAGNOSIS — J069 Acute upper respiratory infection, unspecified: Secondary | ICD-10-CM | POA: Diagnosis not present

## 2014-12-21 DIAGNOSIS — M9901 Segmental and somatic dysfunction of cervical region: Secondary | ICD-10-CM | POA: Diagnosis not present

## 2014-12-21 DIAGNOSIS — M546 Pain in thoracic spine: Secondary | ICD-10-CM | POA: Diagnosis not present

## 2014-12-21 DIAGNOSIS — M5033 Other cervical disc degeneration, cervicothoracic region: Secondary | ICD-10-CM | POA: Diagnosis not present

## 2014-12-21 DIAGNOSIS — M542 Cervicalgia: Secondary | ICD-10-CM | POA: Diagnosis not present

## 2014-12-21 DIAGNOSIS — M4004 Postural kyphosis, thoracic region: Secondary | ICD-10-CM | POA: Diagnosis not present

## 2014-12-21 DIAGNOSIS — M9902 Segmental and somatic dysfunction of thoracic region: Secondary | ICD-10-CM | POA: Diagnosis not present

## 2014-12-22 DIAGNOSIS — F324 Major depressive disorder, single episode, in partial remission: Secondary | ICD-10-CM | POA: Diagnosis not present

## 2014-12-25 DIAGNOSIS — M4004 Postural kyphosis, thoracic region: Secondary | ICD-10-CM | POA: Diagnosis not present

## 2014-12-25 DIAGNOSIS — M9902 Segmental and somatic dysfunction of thoracic region: Secondary | ICD-10-CM | POA: Diagnosis not present

## 2014-12-25 DIAGNOSIS — M5033 Other cervical disc degeneration, cervicothoracic region: Secondary | ICD-10-CM | POA: Diagnosis not present

## 2014-12-25 DIAGNOSIS — M9901 Segmental and somatic dysfunction of cervical region: Secondary | ICD-10-CM | POA: Diagnosis not present

## 2014-12-25 DIAGNOSIS — M546 Pain in thoracic spine: Secondary | ICD-10-CM | POA: Diagnosis not present

## 2014-12-25 DIAGNOSIS — M542 Cervicalgia: Secondary | ICD-10-CM | POA: Diagnosis not present

## 2014-12-27 DIAGNOSIS — M5033 Other cervical disc degeneration, cervicothoracic region: Secondary | ICD-10-CM | POA: Diagnosis not present

## 2014-12-27 DIAGNOSIS — M542 Cervicalgia: Secondary | ICD-10-CM | POA: Diagnosis not present

## 2014-12-27 DIAGNOSIS — M9901 Segmental and somatic dysfunction of cervical region: Secondary | ICD-10-CM | POA: Diagnosis not present

## 2014-12-27 DIAGNOSIS — M9902 Segmental and somatic dysfunction of thoracic region: Secondary | ICD-10-CM | POA: Diagnosis not present

## 2014-12-27 DIAGNOSIS — M546 Pain in thoracic spine: Secondary | ICD-10-CM | POA: Diagnosis not present

## 2014-12-27 DIAGNOSIS — M4004 Postural kyphosis, thoracic region: Secondary | ICD-10-CM | POA: Diagnosis not present

## 2015-01-01 DIAGNOSIS — M546 Pain in thoracic spine: Secondary | ICD-10-CM | POA: Diagnosis not present

## 2015-01-01 DIAGNOSIS — M5033 Other cervical disc degeneration, cervicothoracic region: Secondary | ICD-10-CM | POA: Diagnosis not present

## 2015-01-01 DIAGNOSIS — M4004 Postural kyphosis, thoracic region: Secondary | ICD-10-CM | POA: Diagnosis not present

## 2015-01-01 DIAGNOSIS — M9901 Segmental and somatic dysfunction of cervical region: Secondary | ICD-10-CM | POA: Diagnosis not present

## 2015-01-01 DIAGNOSIS — M542 Cervicalgia: Secondary | ICD-10-CM | POA: Diagnosis not present

## 2015-01-01 DIAGNOSIS — M9902 Segmental and somatic dysfunction of thoracic region: Secondary | ICD-10-CM | POA: Diagnosis not present

## 2015-01-10 DIAGNOSIS — M546 Pain in thoracic spine: Secondary | ICD-10-CM | POA: Diagnosis not present

## 2015-01-10 DIAGNOSIS — M542 Cervicalgia: Secondary | ICD-10-CM | POA: Diagnosis not present

## 2015-01-10 DIAGNOSIS — M4004 Postural kyphosis, thoracic region: Secondary | ICD-10-CM | POA: Diagnosis not present

## 2015-01-10 DIAGNOSIS — M9901 Segmental and somatic dysfunction of cervical region: Secondary | ICD-10-CM | POA: Diagnosis not present

## 2015-01-10 DIAGNOSIS — M9902 Segmental and somatic dysfunction of thoracic region: Secondary | ICD-10-CM | POA: Diagnosis not present

## 2015-01-10 DIAGNOSIS — M5033 Other cervical disc degeneration, cervicothoracic region: Secondary | ICD-10-CM | POA: Diagnosis not present

## 2015-01-15 DIAGNOSIS — M5033 Other cervical disc degeneration, cervicothoracic region: Secondary | ICD-10-CM | POA: Diagnosis not present

## 2015-01-15 DIAGNOSIS — M9902 Segmental and somatic dysfunction of thoracic region: Secondary | ICD-10-CM | POA: Diagnosis not present

## 2015-01-15 DIAGNOSIS — M4004 Postural kyphosis, thoracic region: Secondary | ICD-10-CM | POA: Diagnosis not present

## 2015-01-15 DIAGNOSIS — M546 Pain in thoracic spine: Secondary | ICD-10-CM | POA: Diagnosis not present

## 2015-01-15 DIAGNOSIS — M9901 Segmental and somatic dysfunction of cervical region: Secondary | ICD-10-CM | POA: Diagnosis not present

## 2015-01-15 DIAGNOSIS — M542 Cervicalgia: Secondary | ICD-10-CM | POA: Diagnosis not present

## 2015-01-23 DIAGNOSIS — M9901 Segmental and somatic dysfunction of cervical region: Secondary | ICD-10-CM | POA: Diagnosis not present

## 2015-01-23 DIAGNOSIS — M9902 Segmental and somatic dysfunction of thoracic region: Secondary | ICD-10-CM | POA: Diagnosis not present

## 2015-01-23 DIAGNOSIS — M4004 Postural kyphosis, thoracic region: Secondary | ICD-10-CM | POA: Diagnosis not present

## 2015-01-23 DIAGNOSIS — M542 Cervicalgia: Secondary | ICD-10-CM | POA: Diagnosis not present

## 2015-01-23 DIAGNOSIS — M546 Pain in thoracic spine: Secondary | ICD-10-CM | POA: Diagnosis not present

## 2015-01-23 DIAGNOSIS — M5033 Other cervical disc degeneration, cervicothoracic region: Secondary | ICD-10-CM | POA: Diagnosis not present

## 2015-01-26 DIAGNOSIS — F324 Major depressive disorder, single episode, in partial remission: Secondary | ICD-10-CM | POA: Diagnosis not present

## 2015-01-30 DIAGNOSIS — M546 Pain in thoracic spine: Secondary | ICD-10-CM | POA: Diagnosis not present

## 2015-01-30 DIAGNOSIS — M9902 Segmental and somatic dysfunction of thoracic region: Secondary | ICD-10-CM | POA: Diagnosis not present

## 2015-01-30 DIAGNOSIS — M4004 Postural kyphosis, thoracic region: Secondary | ICD-10-CM | POA: Diagnosis not present

## 2015-01-30 DIAGNOSIS — M9901 Segmental and somatic dysfunction of cervical region: Secondary | ICD-10-CM | POA: Diagnosis not present

## 2015-01-30 DIAGNOSIS — M542 Cervicalgia: Secondary | ICD-10-CM | POA: Diagnosis not present

## 2015-01-30 DIAGNOSIS — M5033 Other cervical disc degeneration, cervicothoracic region: Secondary | ICD-10-CM | POA: Diagnosis not present

## 2015-02-05 DIAGNOSIS — S7001XA Contusion of right hip, initial encounter: Secondary | ICD-10-CM | POA: Diagnosis not present

## 2015-02-05 DIAGNOSIS — S63617A Unspecified sprain of left little finger, initial encounter: Secondary | ICD-10-CM | POA: Diagnosis not present

## 2015-02-08 DIAGNOSIS — M5033 Other cervical disc degeneration, cervicothoracic region: Secondary | ICD-10-CM | POA: Diagnosis not present

## 2015-02-08 DIAGNOSIS — M9901 Segmental and somatic dysfunction of cervical region: Secondary | ICD-10-CM | POA: Diagnosis not present

## 2015-02-08 DIAGNOSIS — M9902 Segmental and somatic dysfunction of thoracic region: Secondary | ICD-10-CM | POA: Diagnosis not present

## 2015-02-08 DIAGNOSIS — M546 Pain in thoracic spine: Secondary | ICD-10-CM | POA: Diagnosis not present

## 2015-02-08 DIAGNOSIS — M542 Cervicalgia: Secondary | ICD-10-CM | POA: Diagnosis not present

## 2015-02-08 DIAGNOSIS — M4004 Postural kyphosis, thoracic region: Secondary | ICD-10-CM | POA: Diagnosis not present

## 2015-02-22 DIAGNOSIS — M5033 Other cervical disc degeneration, cervicothoracic region: Secondary | ICD-10-CM | POA: Diagnosis not present

## 2015-02-22 DIAGNOSIS — M4004 Postural kyphosis, thoracic region: Secondary | ICD-10-CM | POA: Diagnosis not present

## 2015-02-22 DIAGNOSIS — M546 Pain in thoracic spine: Secondary | ICD-10-CM | POA: Diagnosis not present

## 2015-02-22 DIAGNOSIS — M542 Cervicalgia: Secondary | ICD-10-CM | POA: Diagnosis not present

## 2015-02-22 DIAGNOSIS — M9902 Segmental and somatic dysfunction of thoracic region: Secondary | ICD-10-CM | POA: Diagnosis not present

## 2015-02-22 DIAGNOSIS — M9901 Segmental and somatic dysfunction of cervical region: Secondary | ICD-10-CM | POA: Diagnosis not present

## 2015-03-13 DIAGNOSIS — M9901 Segmental and somatic dysfunction of cervical region: Secondary | ICD-10-CM | POA: Diagnosis not present

## 2015-03-13 DIAGNOSIS — M546 Pain in thoracic spine: Secondary | ICD-10-CM | POA: Diagnosis not present

## 2015-03-13 DIAGNOSIS — M9902 Segmental and somatic dysfunction of thoracic region: Secondary | ICD-10-CM | POA: Diagnosis not present

## 2015-03-13 DIAGNOSIS — M4004 Postural kyphosis, thoracic region: Secondary | ICD-10-CM | POA: Diagnosis not present

## 2015-03-13 DIAGNOSIS — M542 Cervicalgia: Secondary | ICD-10-CM | POA: Diagnosis not present

## 2015-03-13 DIAGNOSIS — M5033 Other cervical disc degeneration, cervicothoracic region: Secondary | ICD-10-CM | POA: Diagnosis not present

## 2015-04-04 DIAGNOSIS — M5033 Other cervical disc degeneration, cervicothoracic region: Secondary | ICD-10-CM | POA: Diagnosis not present

## 2015-04-04 DIAGNOSIS — M9901 Segmental and somatic dysfunction of cervical region: Secondary | ICD-10-CM | POA: Diagnosis not present

## 2015-04-04 DIAGNOSIS — M4004 Postural kyphosis, thoracic region: Secondary | ICD-10-CM | POA: Diagnosis not present

## 2015-04-04 DIAGNOSIS — M9902 Segmental and somatic dysfunction of thoracic region: Secondary | ICD-10-CM | POA: Diagnosis not present

## 2015-04-04 DIAGNOSIS — M542 Cervicalgia: Secondary | ICD-10-CM | POA: Diagnosis not present

## 2015-04-04 DIAGNOSIS — M546 Pain in thoracic spine: Secondary | ICD-10-CM | POA: Diagnosis not present

## 2015-04-09 DIAGNOSIS — M9901 Segmental and somatic dysfunction of cervical region: Secondary | ICD-10-CM | POA: Diagnosis not present

## 2015-04-09 DIAGNOSIS — M9902 Segmental and somatic dysfunction of thoracic region: Secondary | ICD-10-CM | POA: Diagnosis not present

## 2015-04-09 DIAGNOSIS — M546 Pain in thoracic spine: Secondary | ICD-10-CM | POA: Diagnosis not present

## 2015-04-09 DIAGNOSIS — M5033 Other cervical disc degeneration, cervicothoracic region: Secondary | ICD-10-CM | POA: Diagnosis not present

## 2015-04-09 DIAGNOSIS — M4004 Postural kyphosis, thoracic region: Secondary | ICD-10-CM | POA: Diagnosis not present

## 2015-04-09 DIAGNOSIS — M542 Cervicalgia: Secondary | ICD-10-CM | POA: Diagnosis not present

## 2015-04-11 ENCOUNTER — Encounter: Payer: Self-pay | Admitting: Internal Medicine

## 2015-04-11 DIAGNOSIS — H40053 Ocular hypertension, bilateral: Secondary | ICD-10-CM | POA: Diagnosis not present

## 2015-04-12 DIAGNOSIS — M5033 Other cervical disc degeneration, cervicothoracic region: Secondary | ICD-10-CM | POA: Diagnosis not present

## 2015-04-12 DIAGNOSIS — M542 Cervicalgia: Secondary | ICD-10-CM | POA: Diagnosis not present

## 2015-04-12 DIAGNOSIS — M546 Pain in thoracic spine: Secondary | ICD-10-CM | POA: Diagnosis not present

## 2015-04-12 DIAGNOSIS — M4004 Postural kyphosis, thoracic region: Secondary | ICD-10-CM | POA: Diagnosis not present

## 2015-04-12 DIAGNOSIS — M9902 Segmental and somatic dysfunction of thoracic region: Secondary | ICD-10-CM | POA: Diagnosis not present

## 2015-04-12 DIAGNOSIS — M9901 Segmental and somatic dysfunction of cervical region: Secondary | ICD-10-CM | POA: Diagnosis not present

## 2015-04-16 DIAGNOSIS — F324 Major depressive disorder, single episode, in partial remission: Secondary | ICD-10-CM | POA: Diagnosis not present

## 2015-04-17 DIAGNOSIS — M542 Cervicalgia: Secondary | ICD-10-CM | POA: Diagnosis not present

## 2015-04-17 DIAGNOSIS — M546 Pain in thoracic spine: Secondary | ICD-10-CM | POA: Diagnosis not present

## 2015-04-17 DIAGNOSIS — M9901 Segmental and somatic dysfunction of cervical region: Secondary | ICD-10-CM | POA: Diagnosis not present

## 2015-04-17 DIAGNOSIS — M5033 Other cervical disc degeneration, cervicothoracic region: Secondary | ICD-10-CM | POA: Diagnosis not present

## 2015-04-17 DIAGNOSIS — M9902 Segmental and somatic dysfunction of thoracic region: Secondary | ICD-10-CM | POA: Diagnosis not present

## 2015-04-17 DIAGNOSIS — M4004 Postural kyphosis, thoracic region: Secondary | ICD-10-CM | POA: Diagnosis not present

## 2015-04-24 DIAGNOSIS — M9901 Segmental and somatic dysfunction of cervical region: Secondary | ICD-10-CM | POA: Diagnosis not present

## 2015-04-24 DIAGNOSIS — M542 Cervicalgia: Secondary | ICD-10-CM | POA: Diagnosis not present

## 2015-04-24 DIAGNOSIS — M5033 Other cervical disc degeneration, cervicothoracic region: Secondary | ICD-10-CM | POA: Diagnosis not present

## 2015-04-24 DIAGNOSIS — M546 Pain in thoracic spine: Secondary | ICD-10-CM | POA: Diagnosis not present

## 2015-04-24 DIAGNOSIS — M9902 Segmental and somatic dysfunction of thoracic region: Secondary | ICD-10-CM | POA: Diagnosis not present

## 2015-04-24 DIAGNOSIS — M4004 Postural kyphosis, thoracic region: Secondary | ICD-10-CM | POA: Diagnosis not present

## 2015-05-02 DIAGNOSIS — M9901 Segmental and somatic dysfunction of cervical region: Secondary | ICD-10-CM | POA: Diagnosis not present

## 2015-05-02 DIAGNOSIS — M4004 Postural kyphosis, thoracic region: Secondary | ICD-10-CM | POA: Diagnosis not present

## 2015-05-02 DIAGNOSIS — M546 Pain in thoracic spine: Secondary | ICD-10-CM | POA: Diagnosis not present

## 2015-05-02 DIAGNOSIS — M542 Cervicalgia: Secondary | ICD-10-CM | POA: Diagnosis not present

## 2015-05-02 DIAGNOSIS — M5033 Other cervical disc degeneration, cervicothoracic region: Secondary | ICD-10-CM | POA: Diagnosis not present

## 2015-05-02 DIAGNOSIS — M9902 Segmental and somatic dysfunction of thoracic region: Secondary | ICD-10-CM | POA: Diagnosis not present

## 2015-05-09 DIAGNOSIS — M5033 Other cervical disc degeneration, cervicothoracic region: Secondary | ICD-10-CM | POA: Diagnosis not present

## 2015-05-09 DIAGNOSIS — M546 Pain in thoracic spine: Secondary | ICD-10-CM | POA: Diagnosis not present

## 2015-05-09 DIAGNOSIS — M542 Cervicalgia: Secondary | ICD-10-CM | POA: Diagnosis not present

## 2015-05-09 DIAGNOSIS — M9901 Segmental and somatic dysfunction of cervical region: Secondary | ICD-10-CM | POA: Diagnosis not present

## 2015-05-09 DIAGNOSIS — M4004 Postural kyphosis, thoracic region: Secondary | ICD-10-CM | POA: Diagnosis not present

## 2015-05-09 DIAGNOSIS — M9902 Segmental and somatic dysfunction of thoracic region: Secondary | ICD-10-CM | POA: Diagnosis not present

## 2015-05-15 DIAGNOSIS — M542 Cervicalgia: Secondary | ICD-10-CM | POA: Diagnosis not present

## 2015-05-15 DIAGNOSIS — M5033 Other cervical disc degeneration, cervicothoracic region: Secondary | ICD-10-CM | POA: Diagnosis not present

## 2015-05-15 DIAGNOSIS — M9902 Segmental and somatic dysfunction of thoracic region: Secondary | ICD-10-CM | POA: Diagnosis not present

## 2015-05-15 DIAGNOSIS — M9901 Segmental and somatic dysfunction of cervical region: Secondary | ICD-10-CM | POA: Diagnosis not present

## 2015-05-15 DIAGNOSIS — M546 Pain in thoracic spine: Secondary | ICD-10-CM | POA: Diagnosis not present

## 2015-05-15 DIAGNOSIS — M4004 Postural kyphosis, thoracic region: Secondary | ICD-10-CM | POA: Diagnosis not present

## 2015-05-24 DIAGNOSIS — M5033 Other cervical disc degeneration, cervicothoracic region: Secondary | ICD-10-CM | POA: Diagnosis not present

## 2015-05-24 DIAGNOSIS — M4004 Postural kyphosis, thoracic region: Secondary | ICD-10-CM | POA: Diagnosis not present

## 2015-05-24 DIAGNOSIS — M9901 Segmental and somatic dysfunction of cervical region: Secondary | ICD-10-CM | POA: Diagnosis not present

## 2015-05-24 DIAGNOSIS — M542 Cervicalgia: Secondary | ICD-10-CM | POA: Diagnosis not present

## 2015-05-24 DIAGNOSIS — M546 Pain in thoracic spine: Secondary | ICD-10-CM | POA: Diagnosis not present

## 2015-05-24 DIAGNOSIS — M9902 Segmental and somatic dysfunction of thoracic region: Secondary | ICD-10-CM | POA: Diagnosis not present

## 2015-05-28 DIAGNOSIS — Z803 Family history of malignant neoplasm of breast: Secondary | ICD-10-CM | POA: Diagnosis not present

## 2015-05-28 DIAGNOSIS — Z1231 Encounter for screening mammogram for malignant neoplasm of breast: Secondary | ICD-10-CM | POA: Diagnosis not present

## 2015-06-07 DIAGNOSIS — M5033 Other cervical disc degeneration, cervicothoracic region: Secondary | ICD-10-CM | POA: Diagnosis not present

## 2015-06-07 DIAGNOSIS — M4004 Postural kyphosis, thoracic region: Secondary | ICD-10-CM | POA: Diagnosis not present

## 2015-06-07 DIAGNOSIS — M542 Cervicalgia: Secondary | ICD-10-CM | POA: Diagnosis not present

## 2015-06-07 DIAGNOSIS — M546 Pain in thoracic spine: Secondary | ICD-10-CM | POA: Diagnosis not present

## 2015-06-07 DIAGNOSIS — M9901 Segmental and somatic dysfunction of cervical region: Secondary | ICD-10-CM | POA: Diagnosis not present

## 2015-06-07 DIAGNOSIS — M9902 Segmental and somatic dysfunction of thoracic region: Secondary | ICD-10-CM | POA: Diagnosis not present

## 2015-06-19 DIAGNOSIS — M546 Pain in thoracic spine: Secondary | ICD-10-CM | POA: Diagnosis not present

## 2015-06-19 DIAGNOSIS — M542 Cervicalgia: Secondary | ICD-10-CM | POA: Diagnosis not present

## 2015-06-19 DIAGNOSIS — M5033 Other cervical disc degeneration, cervicothoracic region: Secondary | ICD-10-CM | POA: Diagnosis not present

## 2015-06-19 DIAGNOSIS — M9901 Segmental and somatic dysfunction of cervical region: Secondary | ICD-10-CM | POA: Diagnosis not present

## 2015-06-19 DIAGNOSIS — M4004 Postural kyphosis, thoracic region: Secondary | ICD-10-CM | POA: Diagnosis not present

## 2015-06-19 DIAGNOSIS — M9902 Segmental and somatic dysfunction of thoracic region: Secondary | ICD-10-CM | POA: Diagnosis not present

## 2015-07-02 DIAGNOSIS — F324 Major depressive disorder, single episode, in partial remission: Secondary | ICD-10-CM | POA: Diagnosis not present

## 2015-07-03 DIAGNOSIS — Z0000 Encounter for general adult medical examination without abnormal findings: Secondary | ICD-10-CM | POA: Diagnosis not present

## 2015-07-03 DIAGNOSIS — E780 Pure hypercholesterolemia: Secondary | ICD-10-CM | POA: Diagnosis not present

## 2015-07-03 DIAGNOSIS — L989 Disorder of the skin and subcutaneous tissue, unspecified: Secondary | ICD-10-CM | POA: Diagnosis not present

## 2015-07-03 DIAGNOSIS — G629 Polyneuropathy, unspecified: Secondary | ICD-10-CM | POA: Diagnosis not present

## 2015-07-03 DIAGNOSIS — N183 Chronic kidney disease, stage 3 (moderate): Secondary | ICD-10-CM | POA: Diagnosis not present

## 2015-07-03 DIAGNOSIS — F325 Major depressive disorder, single episode, in full remission: Secondary | ICD-10-CM | POA: Diagnosis not present

## 2015-07-03 DIAGNOSIS — Z79899 Other long term (current) drug therapy: Secondary | ICD-10-CM | POA: Diagnosis not present

## 2015-07-03 LAB — LIPID PANEL
CHOLESTEROL: 200 (ref 0–200)
HDL: 157 — AB (ref 35–70)
LDL Cholesterol: 145
Triglycerides: 61 (ref 40–160)

## 2015-07-03 LAB — BASIC METABOLIC PANEL
BUN: 21 (ref 4–21)
CREATININE: 1 (ref 0.5–1.1)
Glucose: 99
Potassium: 4.3 (ref 3.4–5.3)
SODIUM: 137 (ref 137–147)

## 2015-07-03 LAB — CBC AND DIFFERENTIAL
HEMATOCRIT: 38 (ref 36–46)
Hemoglobin: 12.8 (ref 12.0–16.0)
Platelets: 233 (ref 150–399)
WBC: 5.7

## 2015-07-03 LAB — HEPATIC FUNCTION PANEL
ALK PHOS: 46 (ref 25–125)
ALT: 14 (ref 7–35)
AST: 22 (ref 13–35)
BILIRUBIN, TOTAL: 0.6

## 2015-07-04 DIAGNOSIS — M4004 Postural kyphosis, thoracic region: Secondary | ICD-10-CM | POA: Diagnosis not present

## 2015-07-04 DIAGNOSIS — M542 Cervicalgia: Secondary | ICD-10-CM | POA: Diagnosis not present

## 2015-07-04 DIAGNOSIS — M546 Pain in thoracic spine: Secondary | ICD-10-CM | POA: Diagnosis not present

## 2015-07-04 DIAGNOSIS — M9901 Segmental and somatic dysfunction of cervical region: Secondary | ICD-10-CM | POA: Diagnosis not present

## 2015-07-04 DIAGNOSIS — M5033 Other cervical disc degeneration, cervicothoracic region: Secondary | ICD-10-CM | POA: Diagnosis not present

## 2015-07-04 DIAGNOSIS — M9902 Segmental and somatic dysfunction of thoracic region: Secondary | ICD-10-CM | POA: Diagnosis not present

## 2015-07-18 DIAGNOSIS — M9901 Segmental and somatic dysfunction of cervical region: Secondary | ICD-10-CM | POA: Diagnosis not present

## 2015-07-18 DIAGNOSIS — M546 Pain in thoracic spine: Secondary | ICD-10-CM | POA: Diagnosis not present

## 2015-07-18 DIAGNOSIS — M9902 Segmental and somatic dysfunction of thoracic region: Secondary | ICD-10-CM | POA: Diagnosis not present

## 2015-07-18 DIAGNOSIS — M4004 Postural kyphosis, thoracic region: Secondary | ICD-10-CM | POA: Diagnosis not present

## 2015-07-18 DIAGNOSIS — M5033 Other cervical disc degeneration, cervicothoracic region: Secondary | ICD-10-CM | POA: Diagnosis not present

## 2015-07-18 DIAGNOSIS — M542 Cervicalgia: Secondary | ICD-10-CM | POA: Diagnosis not present

## 2015-08-04 IMAGING — US US EXTREM LOW VENOUS*R*
1 series · 14 of 24 positions shown · non-contrast
Comparison: None.

CLINICAL DATA: Right lower extremity pain and swelling

EXAM:
RIGHT LOWER EXTREMITY VENOUS DUPLEX ULTRASOUND
TECHNIQUE: Gray-scale sonography with graded compression, as well as color
Doppler and duplex ultrasound, were performed to evaluate the deep
venous system from the level of the common femoral vein through the
popliteal and proximal calf veins. Spectral Doppler was utilized to
evaluate flow at rest and with distal augmentation maneuvers.

[Series 1: us extrem low venous*right* · 14 of 31 slices shown]
[im 1/31]
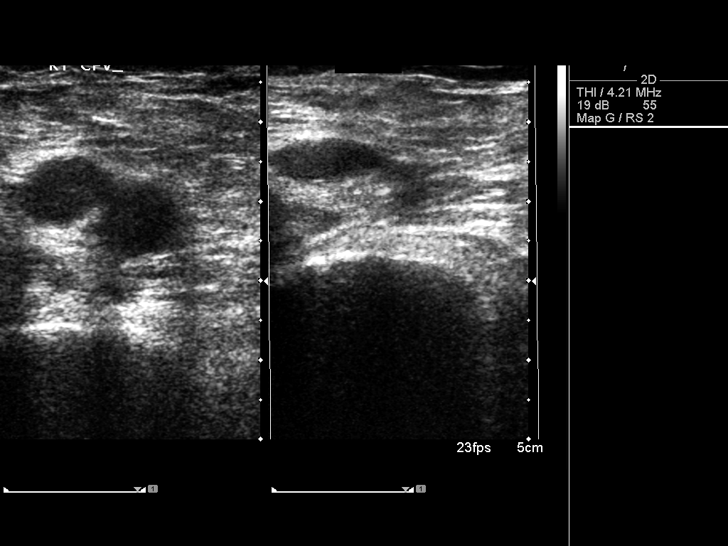
[im 3/31]
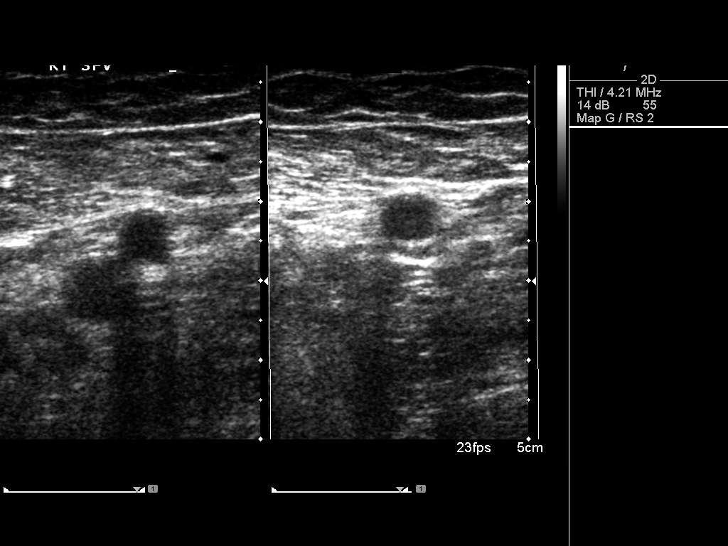
[im 6/31]
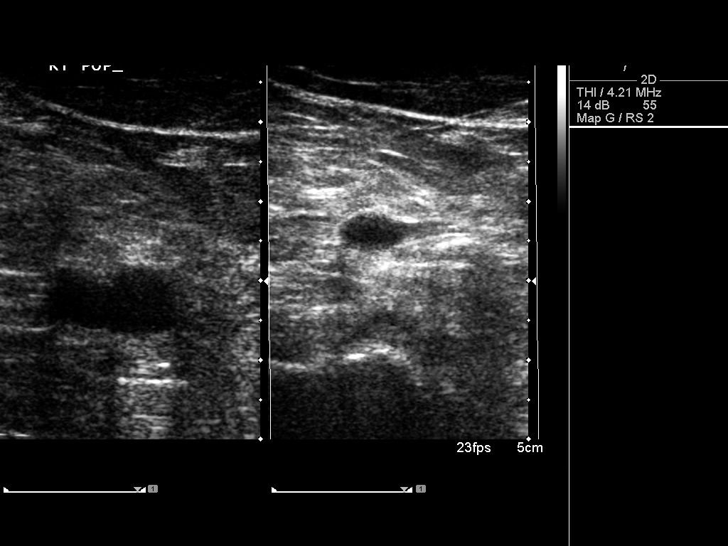
[im 8/31]
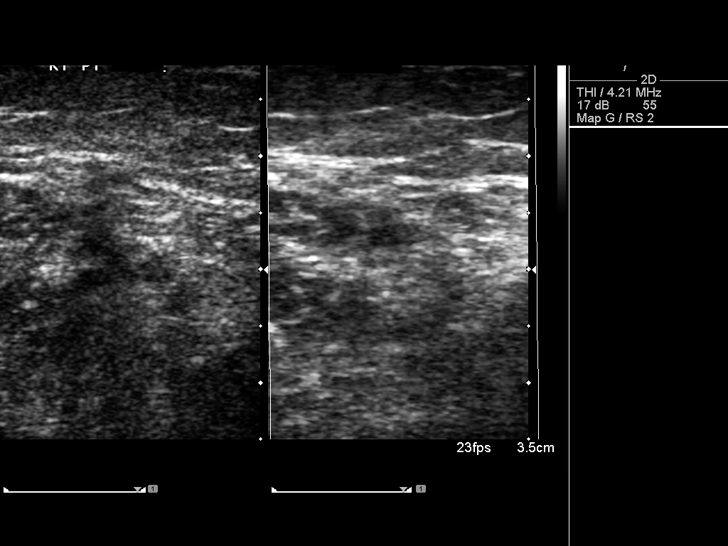
[im 10/31]
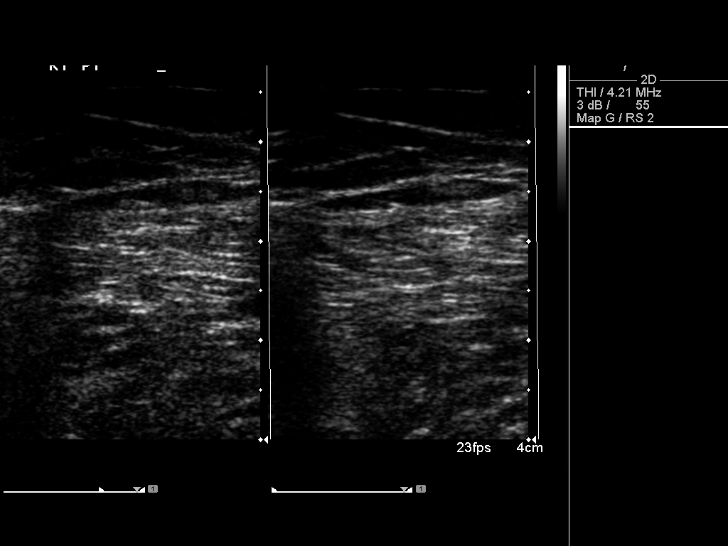
[im 12/31]
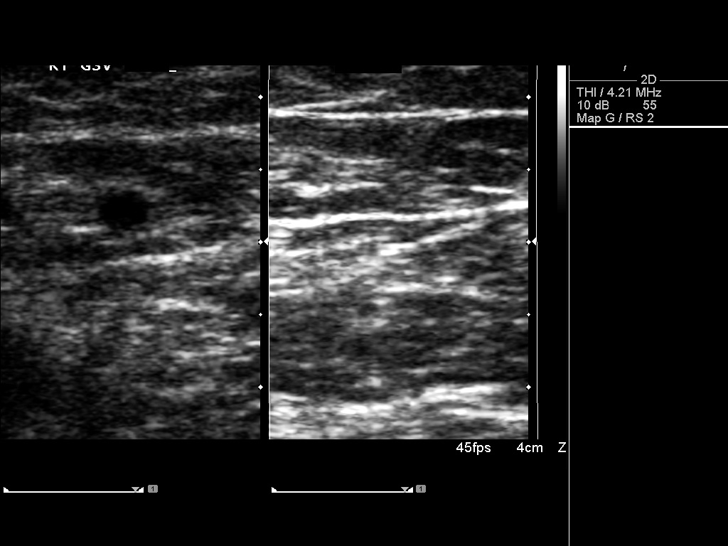
[im 15/31]
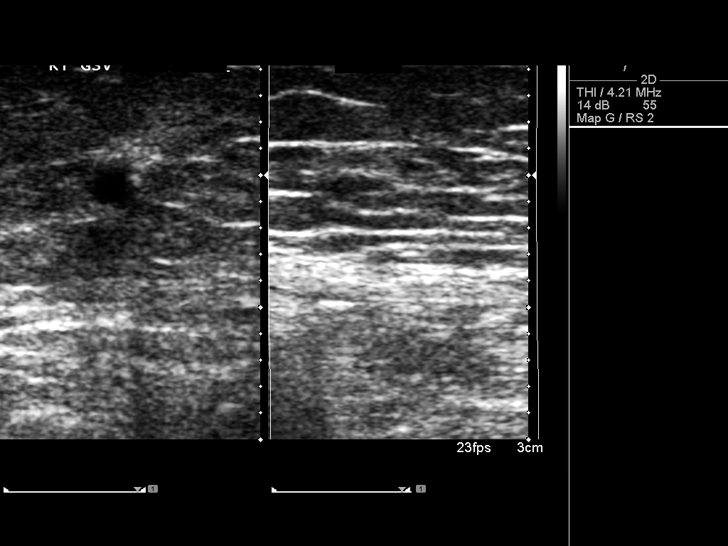
[im 16/31]
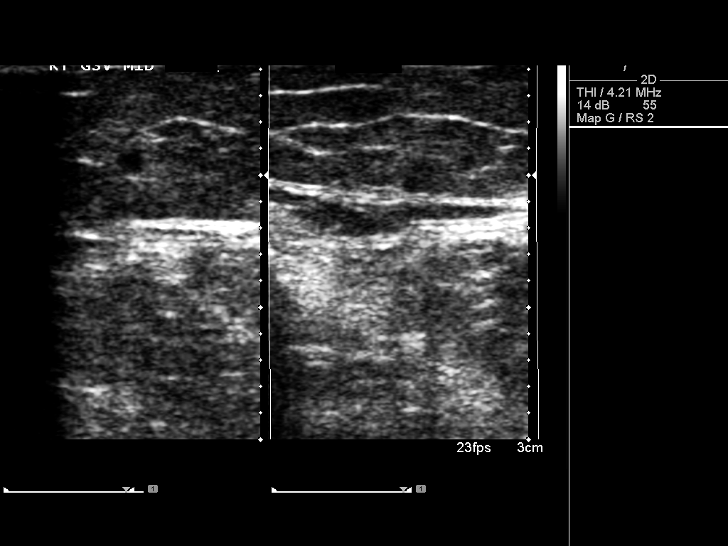
[im 19/31]
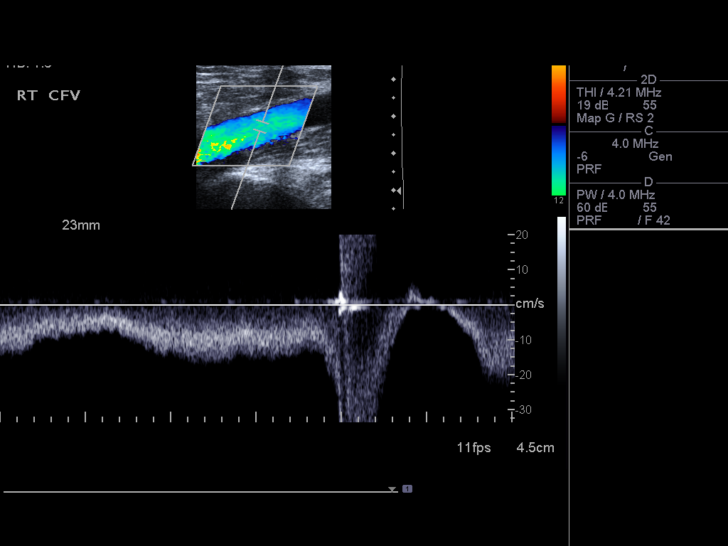
[im 21/31]
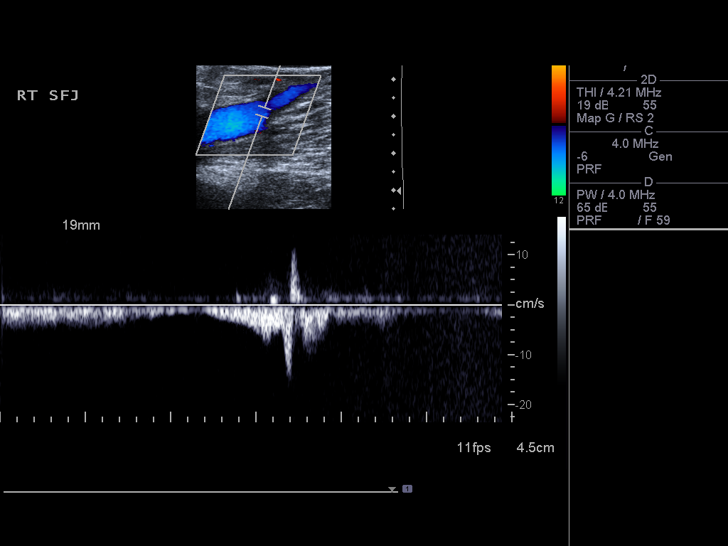
[im 24/31]
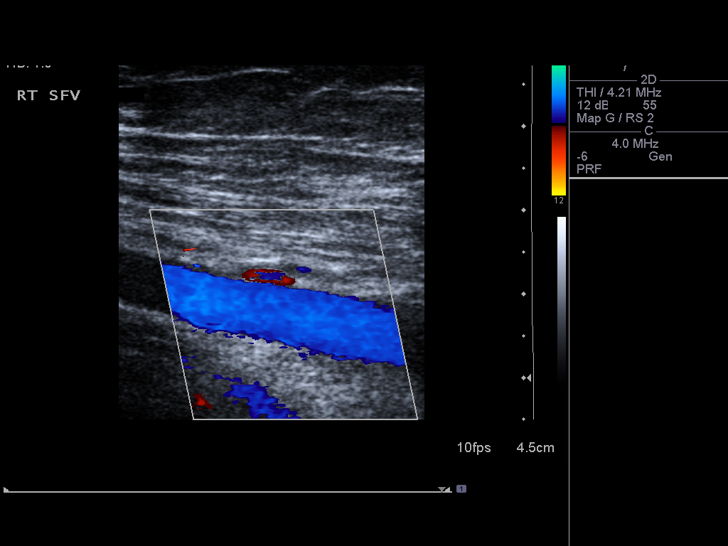
[im 25/31]
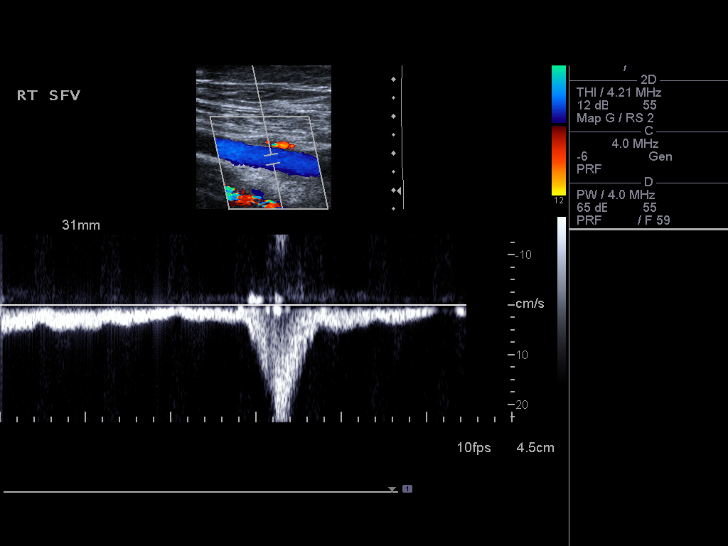
[im 28/31]
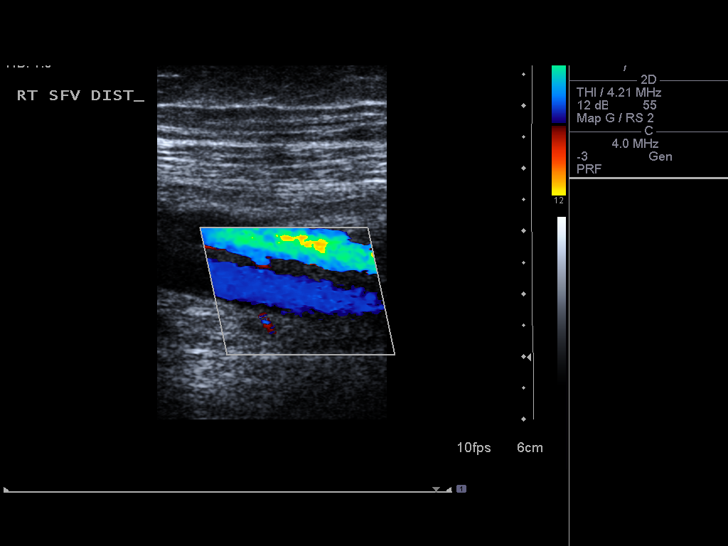
[im 31/31]
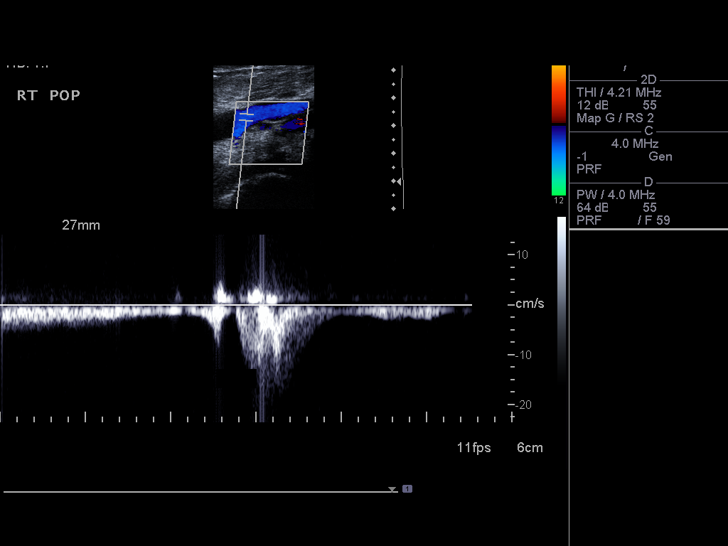

[14 of 24 positions shown; findings below may reference images not displayed]

FINDINGS: Flow in the venous structures of the right lower extremity is
spontaneous and phasic in all segments. There is normal compression
and augmentation in the venous structures of the right lower
extremity. Venous Doppler signal is normal in all regions. There is
no thrombus in the deep or visualized superficial venous structures
on the right. There is no right lower extremity deep venous
incompetence.
IMPRESSION: No evidence of right lower extremity deep venous thrombosis.

## 2015-08-09 DIAGNOSIS — M5033 Other cervical disc degeneration, cervicothoracic region: Secondary | ICD-10-CM | POA: Diagnosis not present

## 2015-08-09 DIAGNOSIS — M4004 Postural kyphosis, thoracic region: Secondary | ICD-10-CM | POA: Diagnosis not present

## 2015-08-09 DIAGNOSIS — M542 Cervicalgia: Secondary | ICD-10-CM | POA: Diagnosis not present

## 2015-08-09 DIAGNOSIS — M9902 Segmental and somatic dysfunction of thoracic region: Secondary | ICD-10-CM | POA: Diagnosis not present

## 2015-08-09 DIAGNOSIS — M9901 Segmental and somatic dysfunction of cervical region: Secondary | ICD-10-CM | POA: Diagnosis not present

## 2015-08-09 DIAGNOSIS — M546 Pain in thoracic spine: Secondary | ICD-10-CM | POA: Diagnosis not present

## 2015-08-30 DIAGNOSIS — M5033 Other cervical disc degeneration, cervicothoracic region: Secondary | ICD-10-CM | POA: Diagnosis not present

## 2015-08-30 DIAGNOSIS — M542 Cervicalgia: Secondary | ICD-10-CM | POA: Diagnosis not present

## 2015-08-30 DIAGNOSIS — M4004 Postural kyphosis, thoracic region: Secondary | ICD-10-CM | POA: Diagnosis not present

## 2015-08-30 DIAGNOSIS — M546 Pain in thoracic spine: Secondary | ICD-10-CM | POA: Diagnosis not present

## 2015-08-30 DIAGNOSIS — M9902 Segmental and somatic dysfunction of thoracic region: Secondary | ICD-10-CM | POA: Diagnosis not present

## 2015-08-30 DIAGNOSIS — M9901 Segmental and somatic dysfunction of cervical region: Secondary | ICD-10-CM | POA: Diagnosis not present

## 2015-09-13 DIAGNOSIS — Z23 Encounter for immunization: Secondary | ICD-10-CM | POA: Diagnosis not present

## 2015-09-17 DIAGNOSIS — F324 Major depressive disorder, single episode, in partial remission: Secondary | ICD-10-CM | POA: Diagnosis not present

## 2015-09-20 DIAGNOSIS — M5033 Other cervical disc degeneration, cervicothoracic region: Secondary | ICD-10-CM | POA: Diagnosis not present

## 2015-09-20 DIAGNOSIS — M9902 Segmental and somatic dysfunction of thoracic region: Secondary | ICD-10-CM | POA: Diagnosis not present

## 2015-09-20 DIAGNOSIS — M9901 Segmental and somatic dysfunction of cervical region: Secondary | ICD-10-CM | POA: Diagnosis not present

## 2015-09-20 DIAGNOSIS — M546 Pain in thoracic spine: Secondary | ICD-10-CM | POA: Diagnosis not present

## 2015-09-20 DIAGNOSIS — M542 Cervicalgia: Secondary | ICD-10-CM | POA: Diagnosis not present

## 2015-09-20 DIAGNOSIS — M4004 Postural kyphosis, thoracic region: Secondary | ICD-10-CM | POA: Diagnosis not present

## 2015-10-08 DIAGNOSIS — D1723 Benign lipomatous neoplasm of skin and subcutaneous tissue of right leg: Secondary | ICD-10-CM | POA: Diagnosis not present

## 2015-10-08 DIAGNOSIS — L821 Other seborrheic keratosis: Secondary | ICD-10-CM | POA: Diagnosis not present

## 2015-10-08 DIAGNOSIS — D225 Melanocytic nevi of trunk: Secondary | ICD-10-CM | POA: Diagnosis not present

## 2015-10-08 DIAGNOSIS — L309 Dermatitis, unspecified: Secondary | ICD-10-CM | POA: Diagnosis not present

## 2015-10-11 DIAGNOSIS — M5033 Other cervical disc degeneration, cervicothoracic region: Secondary | ICD-10-CM | POA: Diagnosis not present

## 2015-10-11 DIAGNOSIS — M9902 Segmental and somatic dysfunction of thoracic region: Secondary | ICD-10-CM | POA: Diagnosis not present

## 2015-10-11 DIAGNOSIS — M542 Cervicalgia: Secondary | ICD-10-CM | POA: Diagnosis not present

## 2015-10-11 DIAGNOSIS — M4004 Postural kyphosis, thoracic region: Secondary | ICD-10-CM | POA: Diagnosis not present

## 2015-10-11 DIAGNOSIS — M546 Pain in thoracic spine: Secondary | ICD-10-CM | POA: Diagnosis not present

## 2015-10-11 DIAGNOSIS — M9901 Segmental and somatic dysfunction of cervical region: Secondary | ICD-10-CM | POA: Diagnosis not present

## 2015-10-26 ENCOUNTER — Encounter: Payer: Self-pay | Admitting: Internal Medicine

## 2015-10-26 DIAGNOSIS — H25043 Posterior subcapsular polar age-related cataract, bilateral: Secondary | ICD-10-CM | POA: Diagnosis not present

## 2015-10-26 DIAGNOSIS — H25013 Cortical age-related cataract, bilateral: Secondary | ICD-10-CM | POA: Diagnosis not present

## 2015-10-26 DIAGNOSIS — H40051 Ocular hypertension, right eye: Secondary | ICD-10-CM | POA: Diagnosis not present

## 2015-10-26 DIAGNOSIS — H40052 Ocular hypertension, left eye: Secondary | ICD-10-CM | POA: Diagnosis not present

## 2015-11-16 DIAGNOSIS — K5900 Constipation, unspecified: Secondary | ICD-10-CM | POA: Diagnosis not present

## 2015-11-22 DIAGNOSIS — M546 Pain in thoracic spine: Secondary | ICD-10-CM | POA: Diagnosis not present

## 2015-11-22 DIAGNOSIS — M4004 Postural kyphosis, thoracic region: Secondary | ICD-10-CM | POA: Diagnosis not present

## 2015-11-22 DIAGNOSIS — M9901 Segmental and somatic dysfunction of cervical region: Secondary | ICD-10-CM | POA: Diagnosis not present

## 2015-11-22 DIAGNOSIS — M9902 Segmental and somatic dysfunction of thoracic region: Secondary | ICD-10-CM | POA: Diagnosis not present

## 2015-11-22 DIAGNOSIS — M5033 Other cervical disc degeneration, cervicothoracic region: Secondary | ICD-10-CM | POA: Diagnosis not present

## 2015-11-22 DIAGNOSIS — M542 Cervicalgia: Secondary | ICD-10-CM | POA: Diagnosis not present

## 2016-02-11 ENCOUNTER — Encounter: Payer: Self-pay | Admitting: Internal Medicine

## 2016-02-11 DIAGNOSIS — H25813 Combined forms of age-related cataract, bilateral: Secondary | ICD-10-CM | POA: Diagnosis not present

## 2016-02-11 DIAGNOSIS — H4322 Crystalline deposits in vitreous body, left eye: Secondary | ICD-10-CM | POA: Diagnosis not present

## 2016-02-11 DIAGNOSIS — Z0100 Encounter for examination of eyes and vision without abnormal findings: Secondary | ICD-10-CM | POA: Diagnosis not present

## 2016-02-11 DIAGNOSIS — H04123 Dry eye syndrome of bilateral lacrimal glands: Secondary | ICD-10-CM | POA: Diagnosis not present

## 2016-03-18 ENCOUNTER — Encounter: Payer: Self-pay | Admitting: Internal Medicine

## 2016-03-18 DIAGNOSIS — H25812 Combined forms of age-related cataract, left eye: Secondary | ICD-10-CM | POA: Diagnosis not present

## 2016-03-18 DIAGNOSIS — H25042 Posterior subcapsular polar age-related cataract, left eye: Secondary | ICD-10-CM | POA: Diagnosis not present

## 2016-03-18 DIAGNOSIS — H2512 Age-related nuclear cataract, left eye: Secondary | ICD-10-CM | POA: Diagnosis not present

## 2016-03-18 DIAGNOSIS — H25032 Anterior subcapsular polar age-related cataract, left eye: Secondary | ICD-10-CM | POA: Diagnosis not present

## 2016-04-04 DIAGNOSIS — S300XXA Contusion of lower back and pelvis, initial encounter: Secondary | ICD-10-CM | POA: Diagnosis not present

## 2016-05-13 ENCOUNTER — Encounter: Payer: Self-pay | Admitting: Internal Medicine

## 2016-05-13 DIAGNOSIS — H25811 Combined forms of age-related cataract, right eye: Secondary | ICD-10-CM | POA: Diagnosis not present

## 2016-05-13 DIAGNOSIS — H25031 Anterior subcapsular polar age-related cataract, right eye: Secondary | ICD-10-CM | POA: Diagnosis not present

## 2016-05-13 DIAGNOSIS — H25041 Posterior subcapsular polar age-related cataract, right eye: Secondary | ICD-10-CM | POA: Diagnosis not present

## 2016-05-13 DIAGNOSIS — H2511 Age-related nuclear cataract, right eye: Secondary | ICD-10-CM | POA: Diagnosis not present

## 2016-05-26 DIAGNOSIS — F324 Major depressive disorder, single episode, in partial remission: Secondary | ICD-10-CM | POA: Diagnosis not present

## 2016-07-03 DIAGNOSIS — E7800 Pure hypercholesterolemia, unspecified: Secondary | ICD-10-CM | POA: Diagnosis not present

## 2016-07-03 DIAGNOSIS — G3184 Mild cognitive impairment, so stated: Secondary | ICD-10-CM | POA: Diagnosis not present

## 2016-07-03 DIAGNOSIS — Z79899 Other long term (current) drug therapy: Secondary | ICD-10-CM | POA: Diagnosis not present

## 2016-07-03 DIAGNOSIS — F325 Major depressive disorder, single episode, in full remission: Secondary | ICD-10-CM | POA: Diagnosis not present

## 2016-07-03 DIAGNOSIS — Z780 Asymptomatic menopausal state: Secondary | ICD-10-CM | POA: Diagnosis not present

## 2016-07-03 DIAGNOSIS — K5901 Slow transit constipation: Secondary | ICD-10-CM | POA: Diagnosis not present

## 2016-07-03 DIAGNOSIS — Z0000 Encounter for general adult medical examination without abnormal findings: Secondary | ICD-10-CM | POA: Diagnosis not present

## 2016-07-03 DIAGNOSIS — N183 Chronic kidney disease, stage 3 (moderate): Secondary | ICD-10-CM | POA: Diagnosis not present

## 2016-07-03 DIAGNOSIS — R5383 Other fatigue: Secondary | ICD-10-CM | POA: Diagnosis not present

## 2016-07-04 LAB — BASIC METABOLIC PANEL
BUN: 26 — AB (ref 4–21)
CREATININE: 1 (ref 0.5–1.1)
GLUCOSE: 118
POTASSIUM: 4.8 (ref 3.4–5.3)
Sodium: 137 (ref 137–147)

## 2016-07-04 LAB — CBC AND DIFFERENTIAL
HCT: 39 (ref 36–46)
Hemoglobin: 13.1 (ref 12.0–16.0)
Platelets: 255 (ref 150–399)
WBC: 7.1

## 2016-08-11 DIAGNOSIS — Z780 Asymptomatic menopausal state: Secondary | ICD-10-CM | POA: Diagnosis not present

## 2016-09-08 DIAGNOSIS — M546 Pain in thoracic spine: Secondary | ICD-10-CM | POA: Diagnosis not present

## 2016-09-08 DIAGNOSIS — M9901 Segmental and somatic dysfunction of cervical region: Secondary | ICD-10-CM | POA: Diagnosis not present

## 2016-09-08 DIAGNOSIS — M9902 Segmental and somatic dysfunction of thoracic region: Secondary | ICD-10-CM | POA: Diagnosis not present

## 2016-09-08 DIAGNOSIS — M4004 Postural kyphosis, thoracic region: Secondary | ICD-10-CM | POA: Diagnosis not present

## 2016-09-08 DIAGNOSIS — M5033 Other cervical disc degeneration, cervicothoracic region: Secondary | ICD-10-CM | POA: Diagnosis not present

## 2016-09-08 DIAGNOSIS — M542 Cervicalgia: Secondary | ICD-10-CM | POA: Diagnosis not present

## 2016-09-15 DIAGNOSIS — Z23 Encounter for immunization: Secondary | ICD-10-CM | POA: Diagnosis not present

## 2016-10-01 DIAGNOSIS — M1711 Unilateral primary osteoarthritis, right knee: Secondary | ICD-10-CM | POA: Diagnosis not present

## 2016-10-01 DIAGNOSIS — M25461 Effusion, right knee: Secondary | ICD-10-CM | POA: Diagnosis not present

## 2016-10-23 DIAGNOSIS — Z1231 Encounter for screening mammogram for malignant neoplasm of breast: Secondary | ICD-10-CM | POA: Diagnosis not present

## 2016-10-23 DIAGNOSIS — Z803 Family history of malignant neoplasm of breast: Secondary | ICD-10-CM | POA: Diagnosis not present

## 2016-10-30 ENCOUNTER — Ambulatory Visit (INDEPENDENT_AMBULATORY_CARE_PROVIDER_SITE_OTHER): Payer: Medicare Other | Admitting: Neurology

## 2016-10-30 ENCOUNTER — Encounter: Payer: Self-pay | Admitting: Neurology

## 2016-10-30 VITALS — BP 133/73 | HR 74 | Ht 63.5 in | Wt 136.0 lb

## 2016-10-30 DIAGNOSIS — E538 Deficiency of other specified B group vitamins: Secondary | ICD-10-CM | POA: Diagnosis not present

## 2016-10-30 DIAGNOSIS — R413 Other amnesia: Secondary | ICD-10-CM | POA: Diagnosis not present

## 2016-10-30 DIAGNOSIS — F0390 Unspecified dementia without behavioral disturbance: Secondary | ICD-10-CM | POA: Insufficient documentation

## 2016-10-30 MED ORDER — DONEPEZIL HCL 5 MG PO TABS: 5.0000 mg | ORAL_TABLET | Freq: Every day | ORAL | 1 refills | Status: DC

## 2016-10-30 NOTE — Progress Notes (Signed)
Reason for visit: Memory disturbance  Referring physician: Dr. Reinaldo Meeker is a 82 y.o. female  History of present illness:  Janet [name] is an 82 year old left-handed white female with a history of a dropped head syndrome. The patient comes back in the office today with a new problem, she has noted some issues with memory over the last 2 years. The patient has particularly had problems with memory over the last 8 months. She reports some difficulty with remembering names for people and with word finding. She has had some general decline in short-term memory. The patient has some difficulty with processing information, paying the bills and doing the finances. The patient denies any problems with directions with driving. She is able to keep up with medications and appointments quite well. She does have some mild fatigue, she only gets about 6 hours of sleep at night. The patient takes mirtazapine at night for sleep. The patient denies any numbness or weakness of extremities, she does have some chronic constipation but no troubles controlling the bladder. She has had some mild sensation of imbalance, she uses a cane for ambulation. She fell twice in April 2017, she has not had any falls since that time. She is sent to this office for an evaluation. Her primary care physician has recently checked a chemistry profile, CBC, and a thyroid profile that were unremarkable.   Past Medical History:  Diagnosis Date  . Cancer [facility])    uterine cancer  . Depression   . 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 Dyslipidemia   . Lumbar radicular syndrome    left L5  . Macular degeneration    left eye    Past Surgical History:  Procedure Laterality Date  . ABDOMINAL HYSTERECTOMY    . TONSILLECTOMY      Family History  Problem Relation Age of Onset  . Diabetes Father   . Heart attack Father   . Dementia Sister   . Pneumonia Brother     Social history:  reports that she has never smoked. She  has never used smokeless tobacco. She reports that she does not drink alcohol. Her drug history is not on file.  Medications:  Prior to Admission medications   Medication Sig Start Date End Date Taking? Authorizing Provider  CALCIUM PO Take by mouth daily.   Yes Historical Provider, MD  cholecalciferol (VITAMIN D) 400 units TABS tablet Take 400 Units by mouth.   Yes Historical Provider, MD  mirtazapine (REMERON) 30 MG tablet Take 15 mg by mouth at bedtime.  10/10/16  Yes Historical Provider, MD  Multiple Vitamins-Minerals (PRESERVISION AREDS PO) Take 1 tablet by mouth daily.   Yes Historical Provider, MD  Omega-3 Fatty Acids (FISH OIL PO) Take by mouth daily.   Yes Historical Provider, MD  sertraline (ZOLOFT) 25 MG tablet Take 12.5 mg by mouth daily.  10/10/16  Yes Historical Provider, MD  donepezil (ARICEPT) 5 MG tablet Take 1 tablet (5 mg total) by mouth at bedtime. 10/30/16   Kathrynn Ducking, MD      Allergies  Allergen Reactions  . Latex Itching  . Tramadol Other (See Comments)    Pt can't remember what side effects she had, she doesn't take it now    ROS:  Out of a complete 14 system review of symptoms, the patient complains only of the following symptoms, and all other reviewed systems are negative.  Shortness of breath  Urinary urgency  Memory loss  Neck stiffness  Depression  Blood pressure 133/73, pulse 74, height 5' 3.5" (1.613 m), weight 136 lb (61.7 kg), SpO2 95 %.  Physical Exam  General: The patient is alert and cooperative at the time of the examination.  Eyes: Pupils are equal, round, and reactive to light. Discs are flat bilaterally.  Neck: The neck is supple, no carotid bruits are noted.  Respiratory: The respiratory examination is clear.  Cardiovascular: The cardiovascular examination reveals a regular rate and rhythm, no obvious murmurs or rubs are noted.  Skin: Extremities are with 1+ edema at the ankles bilaterally.  Neurologic Exam  Mental  status: The patient is alert and oriented x 3 at the time of the examination. The patient has apparent normal recent and remote memory, with an apparently normal attention span and concentration ability. Mini-Mental status examination done today shows a total score 28/30.   Cranial nerves: Facial symmetry is present. There is good sensation of the face to pinprick and soft touch bilaterally. The strength of the facial muscles and the muscles to head turning and shoulder shrug are normal bilaterally. Speech is well enunciated, no aphasia or dysarthria is noted. Extraocular movements are full. Visual fields are full. The tongue is midline, and the patient has symmetric elevation of the soft palate. No obvious hearing deficits are noted.  Motor: The motor testing reveals 5 over 5 strength of all 4 extremities. Good symmetric motor tone is noted throughout.  Sensory: Sensory testing is intact to pinprick, soft touch, vibration sensation, and position sense on all 4 extremities. No evidence of extinction is noted.  Coordination: Cerebellar testing reveals good finger-nose-finger and heel-to-shin bilaterally.  Gait and station: Gait is slightly wide-based, the patient walks with a cane. Tandem gait is unsteady.  Romberg is negative. No drift is seen.  Reflexes: Deep tendon reflexes are symmetric and normal bilaterally, With exception of some decrease in the ankle jerk reflexes bilaterally . Toes are downgoing bilaterally.   Assessment/Plan:  1. Memory disturbance   2. Dropped head syndrome  The patient will be set up for some blood work today. MRI of the brain will be done. She was given a prescription for Aricept, and she will follow-up in about 6 months. I will call when I get the results of the above testing.  Janet Alexanders MD 10/30/2016 1:27 PM  [facility] [address] [place], [place] [address]  Phone [phone] Fax [phone]

## 2016-10-30 NOTE — Patient Instructions (Signed)
   Begin Aricept (donepezil) at 5 mg at night for one month. If this medication is well-tolerated, please call our office and we will call in a prescription for the 10 mg tablets. Look out for side effects that may include nausea, diarrhea, weight loss, or stomach cramps. This medication will also cause a runny nose, therefore there is no need for allergy medications for this purpose.  

## 2016-11-03 ENCOUNTER — Telehealth: Payer: Self-pay

## 2016-11-03 LAB — VITAMIN B12: Vitamin B-12: 561 pg/mL (ref 211–946)

## 2016-11-03 LAB — COPPER, SERUM: COPPER: 96 ug/dL (ref 72–166)

## 2016-11-03 LAB — METHYLMALONIC ACID, SERUM: Methylmalonic Acid: 218 nmol/L (ref 0–378)

## 2016-11-03 NOTE — Telephone Encounter (Signed)
Called w/ unremarkable lab results. Verbalized understanding and appreciation for call. 

## 2016-11-03 NOTE — Telephone Encounter (Signed)
-----   Message from Kathrynn Ducking, MD sent at 11/03/2016  3:11 PM EST -----   The blood work results are unremarkable. Please call the patient.  ----- Message ----- From: Lavone Neri Lab Results In Sent: 10/31/2016   7:43 AM To: Kathrynn Ducking, MD

## 2016-11-05 NOTE — Telephone Encounter (Signed)
Daughter also asks that when scheduling MRI to notify her in addition to calling patient due memory issues.

## 2016-11-05 NOTE — Telephone Encounter (Signed)
Pt's daughter Ilda Foil called request lab results. She is wanting to know what the range is for each test. She advised she will not be available between 2-4 and can LVM at [phone]

## 2016-11-05 NOTE — Telephone Encounter (Signed)
Returned call to patient's daughter, Eustaquio Maize. Reviewed lab results w/ her. Said that they were waiting until after results to start pt on Aricept. Verbalized understanding and appreciation for call.

## 2016-11-10 DIAGNOSIS — F324 Major depressive disorder, single episode, in partial remission: Secondary | ICD-10-CM | POA: Diagnosis not present

## 2016-11-10 NOTE — Telephone Encounter (Signed)
Left message on Beth number for her to call me back about her mothers MRI.

## 2016-11-10 NOTE — Telephone Encounter (Signed)
Pt's daughter returned Emily's call

## 2016-11-10 NOTE — Telephone Encounter (Signed)
Spoke with [employer] and gave her all the information for [facility]

## 2016-11-24 ENCOUNTER — Telehealth: Payer: Self-pay | Admitting: *Deleted

## 2016-11-24 MED ORDER — PREDNISONE 10 MG PO TABS: ORAL_TABLET | ORAL | 0 refills | Status: DC

## 2016-11-24 MED ORDER — METHOCARBAMOL 500 MG PO TABS: 500.0000 mg | ORAL_TABLET | Freq: Three times a day (TID) | ORAL | 1 refills | Status: DC

## 2016-11-24 NOTE — Telephone Encounter (Signed)
I called the patient. The patient had onset 5 days ago of neck discomfort with pain down the right arm to the elbow. The patient has had this transient leg in the past as well. The patient does report some neck stiffness.  I will call in a prednisone Dosepak, and get the patient on Robaxin. If the pain does not improve, we may need to do MRI evaluation of the cervical spine.

## 2016-11-25 ENCOUNTER — Ambulatory Visit
Admission: RE | Admit: 2016-11-25 | Discharge: 2016-11-25 | Disposition: A | Discharge status: Home / Self Care | Payer: Medicare Other | Source: Ambulatory Visit | Attending: Neurology | Admitting: Neurology

## 2016-11-25 DIAGNOSIS — R413 Other amnesia: Secondary | ICD-10-CM

## 2016-11-26 DIAGNOSIS — M20012 Mallet finger of left finger(s): Secondary | ICD-10-CM | POA: Diagnosis not present

## 2016-11-27 ENCOUNTER — Telehealth: Payer: Self-pay | Admitting: Neurology

## 2016-11-27 NOTE — Telephone Encounter (Signed)
   I called the patient. The MRI the brain does show atrophy, minimal small vessel disease. She is to call me after she has been on Aricept 5 mg at night for one month, we will go up to the 10 mg tablet.  MRI brain 11/27/16:  IMPRESSION:  Abnormal MRI brain (without) demonstrating: 1. Mild diffuse, moderate perisylvian and severe mesial temporal atrophy. 2. Mild periventricular and subcortical chronic small vessel ischemic disease.  3. Right thalamic chronic lacunar ischemic infarction. 4. No acute findings. 5. Compared to CT from 05/21/07, the atrophy and chronic right thalamic infarct are new findings.

## 2016-12-01 ENCOUNTER — Telehealth: Payer: Self-pay

## 2016-12-01 NOTE — Telephone Encounter (Signed)
I called the patient, talk with the daughter. The patient had severe muscle cramps in the left leg yesterday, this has been improved with use of Robaxin, no pain today. The patient is having some pain in the neck and shoulders, this was improved with prednisone, but has come back some after the prednisone was stopped.  If the neck pain continues, MRI of the neck can be done. I discussed the MRI brain results with the daughter. The patient likely has an Alzheimer's process.  The Aricept has been discontinued at this point until the neuromuscular discomfort has improved, this may have had something to do with the leg cramp.

## 2016-12-01 NOTE — Telephone Encounter (Signed)
Please call daughter beth [phone] regarding pat.  She has questions about medications .  Granddaughter called last night answering service and no one called back.  Call after 05:30

## 2016-12-01 NOTE — Telephone Encounter (Signed)
Called pt's daughter, apologized for no return call to her granddaughter. Left mssg w/ office hours and to call back for further.

## 2016-12-02 DIAGNOSIS — M4004 Postural kyphosis, thoracic region: Secondary | ICD-10-CM | POA: Diagnosis not present

## 2016-12-02 DIAGNOSIS — M9902 Segmental and somatic dysfunction of thoracic region: Secondary | ICD-10-CM | POA: Diagnosis not present

## 2016-12-02 DIAGNOSIS — M5033 Other cervical disc degeneration, cervicothoracic region: Secondary | ICD-10-CM | POA: Diagnosis not present

## 2016-12-02 DIAGNOSIS — M546 Pain in thoracic spine: Secondary | ICD-10-CM | POA: Diagnosis not present

## 2016-12-02 DIAGNOSIS — M9901 Segmental and somatic dysfunction of cervical region: Secondary | ICD-10-CM | POA: Diagnosis not present

## 2016-12-02 DIAGNOSIS — M542 Cervicalgia: Secondary | ICD-10-CM | POA: Diagnosis not present

## 2016-12-03 DIAGNOSIS — R0602 Shortness of breath: Secondary | ICD-10-CM | POA: Diagnosis not present

## 2016-12-03 DIAGNOSIS — R079 Chest pain, unspecified: Secondary | ICD-10-CM | POA: Diagnosis not present

## 2016-12-03 DIAGNOSIS — R0982 Postnasal drip: Secondary | ICD-10-CM | POA: Diagnosis not present

## 2016-12-17 DIAGNOSIS — M20012 Mallet finger of left finger(s): Secondary | ICD-10-CM | POA: Diagnosis not present

## 2016-12-17 DIAGNOSIS — M25461 Effusion, right knee: Secondary | ICD-10-CM | POA: Diagnosis not present

## 2016-12-17 NOTE — Telephone Encounter (Signed)
Patient's daughter is calling stating she needs a soon appointment with Dr. Jannifer Franklin to discuss the patient going back on Aricept and the patient does not fully understand her diagnosis.

## 2016-12-17 NOTE — Telephone Encounter (Signed)
Returned call to pt's daughter and offered work-in appt on Thurs or Fri (1/4 or 1/5) w/ noon arrival time. May call back if she'd like to schedule.

## 2016-12-17 NOTE — Telephone Encounter (Signed)
Returned call to pt's daughter. Sooner f/u appt scheduled for Wed 1/17 @ noon to discuss MRI results and re-initiation of dementia meds.

## 2016-12-17 NOTE — Telephone Encounter (Signed)
Patients daughter called in reference to MRI results, if she can have a returned call around 4:00pm or after would work better for her.

## 2016-12-26 ENCOUNTER — Encounter: Payer: Self-pay | Admitting: *Deleted

## 2016-12-30 DIAGNOSIS — M20012 Mallet finger of left finger(s): Secondary | ICD-10-CM | POA: Diagnosis not present

## 2016-12-30 NOTE — Telephone Encounter (Signed)
Returned dtr's call. Work-in appt scheduled for Tues 1/30 in case she is unable to make tomorrow's appt due to the weather.

## 2016-12-30 NOTE — Telephone Encounter (Signed)
Appt moved up even sooner to 1/23 d/t pt cancellation.

## 2016-12-30 NOTE — Telephone Encounter (Signed)
Pt's daughter lives in [place] and is concerned about the weather, she does not want to c/a the appt yet.  She is wanting to come in next Wed 01/07/17 - 12 or later. If there is no way that is an option, pls let her know if there is something available next week. If you can not reach her please LVM at [phone]  She is available today between 10-12. Otherwise pls LVM.

## 2016-12-31 ENCOUNTER — Ambulatory Visit: Payer: Self-pay | Admitting: Neurology

## 2017-01-06 ENCOUNTER — Encounter: Payer: Self-pay | Admitting: Neurology

## 2017-01-06 ENCOUNTER — Ambulatory Visit (INDEPENDENT_AMBULATORY_CARE_PROVIDER_SITE_OTHER): Payer: Medicare Other | Admitting: Neurology

## 2017-01-06 VITALS — BP 130/74 | HR 73 | Ht 63.5 in | Wt 135.5 lb

## 2017-01-06 DIAGNOSIS — R413 Other amnesia: Secondary | ICD-10-CM | POA: Diagnosis not present

## 2017-01-06 DIAGNOSIS — R29898 Other symptoms and signs involving the musculoskeletal system: Secondary | ICD-10-CM | POA: Diagnosis not present

## 2017-01-06 MED ORDER — MEMANTINE HCL 28 X 5 MG & 21 X 10 MG PO TABS: ORAL_TABLET | ORAL | 0 refills | Status: DC

## 2017-01-06 NOTE — Progress Notes (Signed)
Reason for visit: Memory disturbance  Janet [name] is an 83 y.o. female  History of present illness:  Janet [name] is an 83 year old left-handed white female with a history of dropped head syndrome. The patient recently has had some problems with shortness of breath that she at times correlates with the position of her head. The patient has had some shoulder or chest pains as well. The patient will be seeing cardiology for an evaluation. The shortness of breath is intermittent, not clearly related to activity. The patient reports some problems with memory, mainly associated with remembering names for people or things. The patient is still able to operate a motor vehicle well, she has not had any problems with keeping up with medications or appointments. The patient in general believes that her activities of daily living are slowing down. She returns to the office today for an evaluation. She was given a trial on Aricept, this had to be discontinued secondary to severe muscle cramps.  Past Medical History:  Diagnosis Date  . Cancer [facility])    uterine cancer  . Depression   . 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 Dyslipidemia   . Lumbar radicular syndrome    left L5  . Macular degeneration    left eye    Past Surgical History:  Procedure Laterality Date  . ABDOMINAL HYSTERECTOMY    . TONSILLECTOMY      Family History  Problem Relation Age of Onset  . Diabetes Father   . Heart attack Father   . Dementia Sister   . Pneumonia Brother     Social history:  reports that she has never smoked. She has never used smokeless tobacco. She reports that she does not drink alcohol. Her drug history is not on file.    Allergies  Allergen Reactions  . Aricept [Donepezil Hcl]     Muscle cramps  . Latex Itching  . Tramadol Other (See Comments)    Pt can't remember what side effects she had, she doesn't take it now    Medications:  Prior to Admission medications   Medication Sig Start Date  End Date Taking? Authorizing Provider  CALCIUM PO Take by mouth daily.   Yes Historical Provider, MD  cholecalciferol (VITAMIN D) 400 units TABS tablet Take 400 Units by mouth.   Yes Historical Provider, MD  mirtazapine (REMERON) 30 MG tablet Take 30 mg by mouth at bedtime.  10/10/16  Yes Historical Provider, MD  Multiple Vitamins-Minerals (PRESERVISION AREDS PO) Take 1 tablet by mouth daily.   Yes Historical Provider, MD  Omega-3 Fatty Acids (FISH OIL PO) Take by mouth daily.   Yes Historical Provider, MD  sertraline (ZOLOFT) 25 MG tablet Take 25 mg by mouth daily.  10/10/16  Yes Historical Provider, MD  memantine (NAMENDA TITRATION PAK) tablet pack 5 mg/day for =1 week; 5 mg twice daily for =1 week; 15 mg/day given in 5 mg and 10 mg separated doses for =1 week; then 10 mg twice daily 01/06/17   Kathrynn Ducking, MD    ROS:  Out of a complete 14 system review of symptoms, the patient complains only of the following symptoms, and all other reviewed systems are negative.  Short of breath Leg swelling Constipation Memory loss, tremors Depression Neck stiffness  Blood pressure 130/74, pulse 73, height 5' 3.5" (1.613 m), weight 135 lb 8 oz (61.5 kg).  Physical Exam  General: The patient is alert and cooperative at the time of the examination.  Skin:  No significant peripheral edema is noted.   Neurologic Exam  Mental status: The patient is alert and oriented x 3 at the time of the examination. The patient has apparent normal recent and remote memory, with an apparently normal attention span and concentration ability.   Cranial nerves: Facial symmetry is present. Speech is normal, no aphasia or dysarthria is noted. Extraocular movements are full. Visual fields are full. The patient does keep her neck in slight flexion.  Motor: The patient has good strength in all 4 extremities.  Sensory examination: Soft touch sensation is symmetric on the face, arms, and legs.  Coordination: The  patient has good finger-nose-finger and heel-to-shin bilaterally.  Gait and station: The patient has a normal gait. Tandem gait was not attempted. Romberg is negative. No drift is seen.  Reflexes: Deep tendon reflexes are symmetric.   Assessment/Plan:  1. Minimum cognitive impairment  2. Dropped head syndrome  The patient will undergo some physical therapy for the dropped head syndrome. The patient is off of Aricept, she will be switched to Namenda, a starter pack was called in. The patient will follow-up in May 2018. Blood work will be done today.  Janet Alexanders MD 01/06/2017 11:47 AM  [facility] [address] [place], [place] [address]  Phone [phone] Fax [phone]

## 2017-01-08 DIAGNOSIS — R0600 Dyspnea, unspecified: Secondary | ICD-10-CM | POA: Insufficient documentation

## 2017-01-08 DIAGNOSIS — R079 Chest pain, unspecified: Secondary | ICD-10-CM | POA: Insufficient documentation

## 2017-01-09 ENCOUNTER — Encounter: Payer: Self-pay | Admitting: Interventional Cardiology

## 2017-01-09 ENCOUNTER — Ambulatory Visit (INDEPENDENT_AMBULATORY_CARE_PROVIDER_SITE_OTHER): Payer: Medicare Other | Admitting: Interventional Cardiology

## 2017-01-09 VITALS — BP 116/70 | HR 75 | Ht 63.0 in | Wt 134.8 lb

## 2017-01-09 DIAGNOSIS — R0600 Dyspnea, unspecified: Secondary | ICD-10-CM | POA: Diagnosis not present

## 2017-01-09 NOTE — Patient Instructions (Signed)
Medication Instructions:  None  Labwork: BNP today  Testing/Procedures: Your physician has requested that you have an echocardiogram. Echocardiography is a painless test that uses sound waves to create images of your heart. It provides your doctor with information about the size and shape of your heart and how well your heart's chambers and valves are working. This procedure takes approximately one hour. There are no restrictions for this procedure.   Follow-Up: Your physician recommends that you schedule a follow-up appointment as needed with Dr. Smith.    Any Other Special Instructions Will Be Listed Below (If Applicable).     If you need a refill on your cardiac medications before your next appointment, please call your pharmacy.   

## 2017-01-09 NOTE — Progress Notes (Addendum)
Cardiology Office Note    Date:  01/09/2017   ID:  Janet, [name] [birth year], MRN [id number]  PCP:  Mathews Argyle, MD  Cardiologist: Sinclair Grooms, MD   Chief Complaint  Patient presents with  . Shortness of Breath    History of Present Illness:  Janet [name] is a 83 y.o. female who presents for Dyspnea evaluation.  Janet [name] is accompanied by her daughter. Janet [name] has "dropped head syndrome". For the past 6 months according to the daughter, she has complained of intermittent dyspnea. The daughter also feels as though she observes shortness of breath when the patient ambulates and engages in activities that did not previously cause shortness of breath. The patient denies orthopnea and PND. She does have difficulty with sudden shortness of breath if she has upper respiratory congestion and tries to sleep on her back. She ordinarily sleeps on her back each evening. She denies chest discomfort. She describes the shortness of breath has the sensation that she could not take a deep breath. When she has this sensation, if she takes a deep breath she immediately feels better. There is no prolonged sensation of smothering. This can occur while sitting, standing, or engaging in physical activity. The daughter notices shortness of breath at times the patient is not complaining of dyspnea.  Past Medical History:  Diagnosis Date  . Cancer [facility])    uterine cancer  . Depression   . 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 Dyslipidemia   . Lumbar radicular syndrome    left L5  . Macular degeneration    left eye    Past Surgical History:  Procedure Laterality Date  . ABDOMINAL HYSTERECTOMY    . TONSILLECTOMY      Current Medications: Outpatient Medications Prior to Visit  Medication Sig Dispense Refill  . memantine (NAMENDA TITRATION PAK) tablet pack 5 mg/day for =1 week; 5 mg twice daily for =1 week; 15 mg/day given in 5 mg and 10 mg separated doses for =1 week; then 10 mg twice  daily 49 tablet 0  . mirtazapine (REMERON) 30 MG tablet Take 30 mg by mouth at bedtime.     . Multiple Vitamins-Minerals (PRESERVISION AREDS PO) Take 1 tablet by mouth 2 (two) times daily.     . Omega-3 Fatty Acids (FISH OIL PO) Take 1 capsule by mouth 2 (two) times daily.     . sertraline (ZOLOFT) 25 MG tablet Take 12.5 mg by mouth daily.     Marland Kitchen CALCIUM PO Take by mouth daily.    . cholecalciferol (VITAMIN D) 400 units TABS tablet Take 400 Units by mouth.     No facility-administered medications prior to visit.      Allergies:   Aricept [donepezil hcl]; Latex; and Tramadol   Social History   Social History  . Marital status: Married    Spouse name: N/A  . Number of children: N/A  . Years of education: N/A   Occupational History  . N/A    Social History Main Topics  . Smoking status: Never Smoker  . Smokeless tobacco: Never Used  . Alcohol use No  . Drug use: Unknown  . Sexual activity: Not Asked   Other Topics Concern  . None   Social History Narrative   Lives at home w/ her husband   Left-handed   Caffeine: tea once a day     Family History:  The patient's family history includes Dementia in her sister; Diabetes in her  father; Heart attack in her father; Pneumonia in her brother.   ROS:   Please see the history of present illness.    Bilateral lower extremity swelling for several years. Wears support stockings. Right leg pain greater than left leg pain. Vision disturbance. Anxiety. Joint swelling especially the right knee. Difficulty with balance. Shoulder pain involving the right arm. Depression.  All other systems reviewed and are negative.   PHYSICAL EXAM:   VS:  BP 116/70 (BP Location: Right Arm)   Pulse 75   Ht 5\' 3"  (1.6 m)   Wt 134 lb 12.5 oz (61.1 kg)   BMI 23.88 kg/m    GEN: Well nourished, well developed, in no acute distress  HEENT: normal  Neck: no JVD, carotid bruits, or masses Cardiac: RRR; no murmurs, rubs, or gallops. There is bilateral trace  to 1+ edema despite moderate tension compression stockings. Respiratory:  clear to auscultation bilaterally, normal work of breathing GI: soft, nontender, nondistended, + BS MS: no deformity or atrophy  Skin: warm and dry, no rash Neuro:  Alert and Oriented x 3, Strength and sensation are intact Psych: euthymic mood, full affect  Wt Readings from Last 3 Encounters:  01/09/17 134 lb 12.5 oz (61.1 kg)  01/06/17 135 lb 8 oz (61.5 kg)  10/30/16 136 lb (61.7 kg)      Studies/Labs Reviewed:   EKG:  EKG  Normal sinus rhythm with normal appearance.  Recent Labs: No results found for requested labs within last 8760 hours.   Lipid Panel No results found for: CHOL, TRIG, HDL, CHOLHDL, VLDL, LDLCALC, LDLDIRECT  Additional studies/ records that were reviewed today include:  Data from Dr. Felipa Eth is reviewed. An electrocardiogram performed on 12/03/16 is unremarkable and consistent with today's finding.  The problem less and past medical history of significance includes peripheral neuropathy, irritable bowel syndrome, drooping of the right eyelid, fibromyalgia, depression, chronic sinusitis, hyperlipidemia, and arthritis of the knee.    ASSESSMENT:    1. Dyspnea, unspecified type      PLAN:  In order of problems listed above:  1. Dyspnea associated with bilateral lower extremity edema which raises the question of pulmonary hypertension, either  primary or secondary. Plan 2-D Doppler echocardiogram and BNP. Further evaluation will depend results of this evaluation. My general suspicion at this time is that there may be diastolic heart failure present. If this is true, Aldactone may be helpful in this setting. We will not make any suggestions can concerning therapy until we can document pathology. Follow-up will be based upon findings.    Medication Adjustments/Labs and Tests Ordered: Current medicines are reviewed at length with the patient today.  Concerns regarding medicines are  outlined above.  Medication changes, Labs and Tests ordered today are listed in the Patient Instructions below. Patient Instructions  Medication Instructions:  None  Labwork: BNP today  Testing/Procedures: Your physician has requested that you have an echocardiogram. Echocardiography is a painless test that uses sound waves to create images of your heart. It provides your doctor with information about the size and shape of your heart and how well your heart's chambers and valves are working. This procedure takes approximately one hour. There are no restrictions for this procedure.   Follow-Up: Your physician recommends that you schedule a follow-up appointment as needed with Dr. Tamala Julian.    Any Other Special Instructions Will Be Listed Below (If Applicable).     If you need a refill on your cardiac medications before your next appointment, please  call your pharmacy.      Signed, Sinclair Grooms, MD  01/09/2017 3:24 PM    [facility] [place], [place], [place]  [address] Phone: [phone]; Fax: [phone]

## 2017-01-10 LAB — CK: Total CK: 142 U/L (ref 24–173)

## 2017-01-10 LAB — PRO B NATRIURETIC PEPTIDE: NT-PRO BNP: 109 pg/mL (ref 0–738)

## 2017-01-10 LAB — ACETYLCHOLINE RECEPTOR, BINDING: AChR Binding Ab, Serum: 0.09 nmol/L (ref 0.00–0.24)

## 2017-01-12 ENCOUNTER — Telehealth: Payer: Self-pay | Admitting: *Deleted

## 2017-01-12 NOTE — Telephone Encounter (Signed)
Pt notified of lab results by phone with verbal understanding. Pt aware to keep echo appt. Verified echo appt date and time .

## 2017-01-13 ENCOUNTER — Encounter: Payer: Self-pay | Admitting: Physical Therapy

## 2017-01-13 ENCOUNTER — Ambulatory Visit: Payer: Self-pay | Admitting: Neurology

## 2017-01-13 ENCOUNTER — Ambulatory Visit: Payer: Medicare Other | Attending: Neurology | Admitting: Physical Therapy

## 2017-01-13 DIAGNOSIS — R293 Abnormal posture: Secondary | ICD-10-CM | POA: Diagnosis not present

## 2017-01-13 DIAGNOSIS — R29898 Other symptoms and signs involving the musculoskeletal system: Secondary | ICD-10-CM | POA: Diagnosis not present

## 2017-01-13 DIAGNOSIS — M6281 Muscle weakness (generalized): Secondary | ICD-10-CM | POA: Insufficient documentation

## 2017-01-13 NOTE — Therapy (Signed)
[place] [address] [place], [place], [address] Phone: [phone]   Fax:  [phone]  Physical Therapy Evaluation  Patient Details  Name: Janet [name] MRN: [id number] Date of Birth: [birth year] Referring Provider: Dr. Floyde Parkins  Encounter Date: 01/13/2017      PT End of Session - 01/14/17 2027    Visit Number 1   Number of Visits 9   Date for PT Re-Evaluation 03/12/17   Authorization Type Medicare   Authorization Time Period 01-13-17 - 03-12-17   PT Start Time 1402   PT Stop Time 1446   PT Time Calculation (min) 44 min      Past Medical History:  Diagnosis Date  . Cancer [facility])    uterine cancer  . Depression   . 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 Dyslipidemia   . Lumbar radicular syndrome    left L5  . Macular degeneration    left eye    Past Surgical History:  Procedure Laterality Date  . ABDOMINAL HYSTERECTOMY    . TONSILLECTOMY      There were no vitals filed for this visit.       Subjective Assessment - 01/14/17 2006    Subjective Pt reports this problem started at least 2 years ago; was in PT at [facility] and reports that this suddenly occurred overnight; has been doing cervical retraction exercise as instructed by chiropractor   Pertinent History Dropped head syndrome 11-16-14:  lumbar radicular syndrome; dyspnea   Patient Stated Goals "I want to hang my head less often"            [facility] PT Assessment - 01/14/17 0001      Assessment   Medical Diagnosis Dropped head syndrome   Referring Provider Dr. Floyde Parkins   Prior Therapy had therapy for SI pain/sciatica at [facility] 2 years ago     [place]   Has the patient fallen in the past 6 months Yes   How many times? 1   Has the patient had a decrease in activity level because of a fear of falling?  No   Is the patient reluctant to leave their home because of a fear of falling?  No     Home Environment   Living Environment Other (Comment)   Additional Comments Retirement home     Prior Function   Level of Independence Independent      Cervical AROM:  R cervical rotation 52 degrees;   L cervical rotation 43    "   R lateral flexion 33;       L cervical lateral flexion 34 degrees`  Cervical extension 53 degrees Flexion WFL's   Strength testing:  Cervical extensor strength 3+ - 4-/5 Cervical flexors 4/5  R and L cervical rotators 4/5 R and L cervical lateral flexors 4-/5  PT Education - 01/14/17 2026    Education provided Yes   Education Details cervical isometrics and ROM - see pt instructions   Person(s) Educated Patient   Methods Explanation;Demonstration;Handout   Comprehension Verbalized understanding;Returned demonstration             PT Long Term Goals - 01/14/17 2035      PT LONG TERM GOAL #1   Title Pt will demonstrate improved cervical extension strength by holding head upright for at least 20" nonstop during PT session.  (02-12-17)   Time 4   Period Weeks   Status New     PT LONG TERM GOAL #2   Title Pt will be independent in HEP for cervical strengthening, stretching and ROM exercises. (02-12-17)   Time 4   Period Weeks   Status New     PT LONG TERM GOAL #3   Title Pt will report at least 50% in ability to hold head upright for improved posture.  (02-12-17)   Time 4   Period Weeks   Status New               Plan -  01/14/17 2028    Clinical Impression Statement Pt is an 83 year old lady with dropped head syndrome, initially diagnosed in Dec. 2015.  Pt presents with c/o cervical tightness, decreased active cervical ROM, cervical musc. weakness and postural deviations.  PMH includes macular degeneration, lumbar radicular syndrome and dyspnea. Pt will benefit from PT to address postural abnormality, cervical ROM and strength deficits.                                                                                                     Rehab Potential Good   PT Frequency 2x / week   PT Duration 4 weeks   PT Treatment/Interventions ADLs/Self Care Home Management;Cryotherapy;Moist Heat;Electrical Stimulation;Ultrasound;Therapeutic activities;Therapeutic exercise;Neuromuscular re-education;Patient/family education;Manual techniques;Passive range of motion   PT Next Visit Plan check HEP for cervical ROM and strengthening; begin cervical strengthening - extension in prone if able to tolerate   PT Home Exercise Plan see pt instructions   Consulted and Agree with Plan of Care Patient      Patient will benefit from skilled therapeutic intervention in order to improve the following deficits and impairments:  Postural dysfunction, Decreased strength, Decreased range of motion, Impaired flexibility, Impaired vision/preception  Visit Diagnosis: Muscle weakness (generalized) - Plan: PT plan of care cert/re-cert  Other symptoms and signs involving the musculoskeletal system - Plan: PT plan of care cert/re-cert  Abnormal posture - Plan: PT plan of care cert/re-cert      G-Codes - [id number]    Functional Assessment Tool Used clinical judgment - pt presents with abnormal posture   Functional Limitation Changing and maintaining body position   Changing and Maintaining Body Position Current Status [id number]) At least 40 percent but less than 60 percent impaired, limited or restricted   Changing and Maintaining Body Position  Goal Status [id number]) At least 20 percent but less than 40 percent impaired, limited or restricted  Problem List Patient Active Problem List   Diagnosis Date Noted  . Chest pain 01/08/2017  . Dyspnea 01/08/2017  . Memory difficulty 10/30/2016  . 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    Alda Lea, PT 01/14/2017, 8:47 PM  [place] [address] [place], [place], [address] Phone: [phone]   Fax:  [phone]  Name: Janet [name] MRN: [id number] Date of Birth: Jun 04, 1932

## 2017-01-13 NOTE — Patient Instructions (Signed)
(  Home) Extension: Cervico-Thoracic (Mid Cervical Support)    Leaning back, towel or wedge at base of neck, tuck chin in, fingers locked together behind head. Extend spine by pressing base of neck against wedge. Repeat __10__ times per set. Do _1___ sets per session. Do _5___ sessions per week.  Copyright  VHI. All rights reserved.  Strengthening: Lateral Flexion - Resisted, Mid to End Range    Facing forward, fingertips on right temple, tilt head toward shoulder. Give light resistance. Repeat __10__ times per set. Do _1___ sets per session. Do __1-2__ sessions per day.  http://orth.exer.us/327   Copyright  VHI. All rights reserved.  Strengthening: Lateral Flexion - Isometric (Out of Neutral)    Tilt head toward right shoulder. Apply light pressure to side of head just above ear and resist tilting head down further. Hold __5__ seconds. Repeat __10__ times per set. Do _1___ sets per session. Do _1___ sessions per day.  http://orth.exer.us/317   Copyright  VHI. All rights reserved.  Flexibility: Neck Retraction    Pull head straight back, keeping eyes and jaw level. Repeat _10___ times per set. Do _1___ sets per session. Do __1__ sessions per day.  http://orth.exer.us/345   Copyright  VHI. All rights reserved.

## 2017-01-15 DIAGNOSIS — M4004 Postural kyphosis, thoracic region: Secondary | ICD-10-CM | POA: Diagnosis not present

## 2017-01-15 DIAGNOSIS — M546 Pain in thoracic spine: Secondary | ICD-10-CM | POA: Diagnosis not present

## 2017-01-15 DIAGNOSIS — M542 Cervicalgia: Secondary | ICD-10-CM | POA: Diagnosis not present

## 2017-01-15 DIAGNOSIS — M9902 Segmental and somatic dysfunction of thoracic region: Secondary | ICD-10-CM | POA: Diagnosis not present

## 2017-01-15 DIAGNOSIS — M5033 Other cervical disc degeneration, cervicothoracic region: Secondary | ICD-10-CM | POA: Diagnosis not present

## 2017-01-15 DIAGNOSIS — M9901 Segmental and somatic dysfunction of cervical region: Secondary | ICD-10-CM | POA: Diagnosis not present

## 2017-01-20 ENCOUNTER — Ambulatory Visit: Payer: Medicare Other | Attending: Neurology | Admitting: Physical Therapy

## 2017-01-20 DIAGNOSIS — R293 Abnormal posture: Secondary | ICD-10-CM | POA: Insufficient documentation

## 2017-01-20 DIAGNOSIS — M6281 Muscle weakness (generalized): Secondary | ICD-10-CM | POA: Insufficient documentation

## 2017-01-20 NOTE — Patient Instructions (Signed)
Axial Extension    Lie on stomach with forehead resting on floor and arms at sides. Tuck chin in and raise head from floor without bending it up or down. Repeat __10__ times per set. Do _1___ sets per session. Do __2__ sessions per day.  http://orth.exer.us/971   Copyright  VHI. All rights reserved.  (Home) Extension: Cervico-Thoracic (Mid Cervical Support)   Place blue theraband around head - extend head back, trying to look up at ceiling as far as possible - hold 3 secs  DO 10 reps - 3x per day

## 2017-01-21 DIAGNOSIS — M20012 Mallet finger of left finger(s): Secondary | ICD-10-CM | POA: Diagnosis not present

## 2017-01-21 NOTE — Therapy (Signed)
[facility] [address] [place], [place], [address] Phone: [phone]   Fax:  [phone]  Physical Therapy Treatment  Patient Details  Name: Janet [name] MRN: [id number] Date of Birth: 11/14/1932 Referring Provider: Dr. Floyde Parkins  Encounter Date: 01/20/2017      PT End of Session - 01/21/17 1157    Visit Number 2  G2   Number of Visits 9   Date for PT Re-Evaluation 03/12/17   Authorization Type Medicare   Authorization Time Period 01-13-17 - 03-12-17   PT Start Time 1147   PT Stop Time 1235   PT Time Calculation (min) 48 min      Past Medical History:  Diagnosis Date  . Cancer [facility])    uterine cancer  . Depression   . 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 Dyslipidemia   . Lumbar radicular syndrome    left L5  . Macular degeneration    left eye    Past Surgical History:  Procedure Laterality Date  . ABDOMINAL HYSTERECTOMY    . TONSILLECTOMY      There were no vitals filed for this visit.      Subjective Assessment - 01/21/17 1148    Subjective Pt reports she has been trying to hold head up as much as possible at home - reports her husband gives thumbs up sign to cue her   Pertinent History Dropped head syndrome 11-16-14:  lumbar radicular syndrome; dyspnea   Patient Stated Goals "I want to hang my head less often"   Currently in Pain? Other (Comment)  no true pain - but tightness in back of neck                         [facility] Adult PT Treatment/Exercise - 01/21/17 0001      Exercises   Exercises Neck;Shoulder     Neck Exercises: Theraband   Scapula Retraction 10 reps;Green  seated position - elbows bent at 90 degrees     Neck Exercises: Standing   Neck Retraction 10 reps  seated position - pushing into therapist's hand for feedback   Neck Retraction Limitations weakness in cervical musc.     Neck Exercises: Seated   W Back 10 reps     Neck Exercises: Prone   Axial  Exentsion 10 reps   Other Prone Exercise Pt performed cervical extension in quadruped position x 10 reps; pt performed thoracic extension with arms down by side x 10 reps:  performed extension with 1 arm flexed, overhead with thoracic and cervical extension x 10 reps with each arm overhead     Pt amb. 120' around track with close CGA - holding blue ball overhead for cervical extensor strengthening - cues to hold head upright Pt performed resisted isometrics - Rt and Lt lateral flexion x 10 reps each    Pt performed cervical extension with blue theraband in seated position - 10 reps  X 2 sets with 3 sec hold; towel used on head to avoid  pulling hair         PT Education - 01/21/17 1156    Education provided Yes   Education Details cervical extension prone   Person(s) Educated Patient   Methods Explanation;Demonstration;Handout   Comprehension Verbalized understanding;Returned demonstration             PT Long Term Goals - 01/14/17 2035      PT LONG TERM GOAL #1   Title  Pt will demonstrate improved cervical extension strength by holding head upright for at least 20" nonstop during PT session.  (02-12-17)   Time 4   Period Weeks   Status New     PT LONG TERM GOAL #2   Title Pt will be independent in HEP for cervical strengthening, stretching and ROM exercises. (02-12-17)   Time 4   Period Weeks   Status New     PT LONG TERM GOAL #3   Title Pt will report at least 50% in ability to hold head upright for improved posture.  (02-12-17)   Time 4   Period Weeks   Status New               Plan - 01/21/17 1157    Clinical Impression Statement Pt has decreased muscle strength and decr. cervical musc. endurance in extensors, resulting in difficulty holding head upright for prolonged periods of time; tolerated prone exercises well with frequent rest periods due to fatigue   Rehab Potential Good   PT Frequency 2x / week   PT Duration 4 weeks   PT Treatment/Interventions  ADLs/Self Care Home Management;Cryotherapy;Moist Heat;Electrical Stimulation;Ultrasound;Therapeutic activities;Therapeutic exercise;Neuromuscular re-education;Patient/family education;Manual techniques;Passive range of motion   PT Next Visit Plan continue cervical strengthening   PT Home Exercise Plan see pt instructions; additional exs added on 01-20-17   Consulted and Agree with Plan of Care Patient      Patient will benefit from skilled therapeutic intervention in order to improve the following deficits and impairments:  Postural dysfunction, Decreased strength, Decreased range of motion, Impaired flexibility, Impaired vision/preception  Visit Diagnosis: Muscle weakness (generalized)  Abnormal posture     Problem List Patient Active Problem List   Diagnosis Date Noted  . Chest pain 01/08/2017  . Dyspnea 01/08/2017  . Memory difficulty 10/30/2016  . 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 [place], [place], [address] Phone: [phone]   Fax:  [phone]  Name: Janet [name] MRN: [id number] Date of Birth: July 30, 1932

## 2017-01-22 ENCOUNTER — Ambulatory Visit: Payer: Medicare Other | Admitting: Physical Therapy

## 2017-01-22 DIAGNOSIS — M6281 Muscle weakness (generalized): Secondary | ICD-10-CM | POA: Diagnosis not present

## 2017-01-22 DIAGNOSIS — R293 Abnormal posture: Secondary | ICD-10-CM

## 2017-01-22 NOTE — Therapy (Signed)
[facility] [address] [place], [place], [address] Phone: [phone]   Fax:  [phone]  Physical Therapy Treatment  Patient Details  Name: Janet [name] MRN: [id number] Date of Birth: Nov 15, 1932 Referring Provider: Dr. Floyde Parkins  Encounter Date: 01/22/2017      PT End of Session - 01/22/17 1245    Visit Number 3   Number of Visits 9   Date for PT Re-Evaluation 03/12/17   Authorization Type Medicare   Authorization Time Period 01-13-17 - 03-12-17   PT Start Time 1112   PT Stop Time 1155   PT Time Calculation (min) 43 min      Past Medical History:  Diagnosis Date  . Cancer [facility])    uterine cancer  . Depression   . 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 Dyslipidemia   . Lumbar radicular syndrome    left L5  . Macular degeneration    left eye    Past Surgical History:  Procedure Laterality Date  . ABDOMINAL HYSTERECTOMY    . TONSILLECTOMY      There were no vitals filed for this visit.      Subjective Assessment - 01/22/17 1231    Subjective Pt reports she did exercises at home - states she had a hard time lying on her stomach and lifting head up - did it on yoga mat on floor    Pertinent History Dropped head syndrome 11-16-14:  lumbar radicular syndrome; dyspnea   Patient Stated Goals "I want to hang my head less often"   Currently in Pain? No/denies          TherEx:    In quadruped position - pt performed cervical extension 10 reps with 3 sec hold  Prone position:  Pt performed thoracic extension - arms by side - lifting shoulders and head up off mat table 10 reps  Lifting 1 arm up overhead - adding cervical extension with min assist to achieve full ROM - 10 reps each side  "W" exercise - scapular retraction only - 10 reps in prone position  Propped on forearms in prone position - pt performed cervical extension x 10 reps with 3 sec hold              [facility] Adult PT  Treatment/Exercise - 01/22/17 1134      Ambulation/Gait   Ambulation/Gait Yes   Ambulation Distance (Feet) 480 Feet  2 laps holding ball overhead; 2 laps not holding ball   Assistive device None   Gait Comments Used gait training for strengthening cervical extensors with pt holding ball overhead for 2 laps (240') with cues to hold head upright:  2 laps with pt not holding ball - no device used - cues to hold head up with retraction as much as possible; pt demonstrates decreased muscle endurance as slight cervical flexion noted after approx 100':  pt able to extend with cues                                                            Neck Exercises: Theraband   Other Theraband Exercises Pt performed cervical extension with blue theraband - seated in chair for back support:  2 sets 10 reps with 3 sec hold     Neck Exercises: Seated  Neck Retraction 20 reps   W Back 10 reps     Neck Exercises: Prone   Axial Exentsion Other reps (comment)  3 sets 10 reps - assistance on final 2 sets for complete ROM     Shoulder Exercises: Prone   Retraction Strengthening;Both;10 reps                PT Education - 01/21/17 1156    Education provided Yes   Education Details cervical extension prone   Person(s) Educated Patient   Methods Explanation;Demonstration;Handout   Comprehension Verbalized understanding;Returned demonstration             PT Long Term Goals - 01/14/17 2035      PT LONG TERM GOAL #1   Title Pt will demonstrate improved cervical extension strength by holding head upright for at least 20" nonstop during PT session.  (02-12-17)   Time 4   Period Weeks   Status New     PT LONG TERM GOAL #2   Title Pt will be independent in HEP for cervical strengthening, stretching and ROM exercises. (02-12-17)   Time 4   Period Weeks   Status New     PT LONG TERM GOAL #3   Title Pt will report at least 50% in ability to hold head upright for improved posture.  (02-12-17)   Time 4    Period Weeks   Status New               Plan - 01/22/17 1246    Clinical Impression Statement Pt unable to achieve final third AROM of cervical extension in prone position due to cervical extensor weakness:  able to perform static hold in extended with min assist with pt in prone position.  Pt tolerating exercises well, continues to fatigue quickly with frequent rest breaks required. No tremors in UE's occurred today, indicative of increasing strength in UE's and incr. activity tolerance.                                                                                                                                                                 Rehab Potential Good   PT Frequency 2x / week   PT Duration 4 weeks   PT Treatment/Interventions ADLs/Self Care Home Management;Cryotherapy;Moist Heat;Electrical Stimulation;Ultrasound;Therapeutic activities;Therapeutic exercise;Neuromuscular re-education;Patient/family education;Manual techniques;Passive range of motion   PT Next Visit Plan continue cervical strengthening   PT Home Exercise Plan see pt instructions; additional exs added on 01-20-17   Consulted and Agree with Plan of Care Patient      Patient will benefit from skilled therapeutic intervention in order to improve the following deficits and impairments:  Postural dysfunction, Decreased strength, Decreased range of motion, Impaired flexibility, Impaired vision/preception  Visit Diagnosis: Muscle weakness (generalized)  Abnormal  posture     Problem List Patient Active Problem List   Diagnosis Date Noted  . Chest pain 01/08/2017  . Dyspnea 01/08/2017  . Memory difficulty 10/30/2016  . 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

## 2017-01-26 ENCOUNTER — Other Ambulatory Visit: Payer: Self-pay

## 2017-01-26 ENCOUNTER — Ambulatory Visit (HOSPITAL_COMMUNITY): Payer: Medicare Other | Attending: Internal Medicine

## 2017-01-26 DIAGNOSIS — I340 Nonrheumatic mitral (valve) insufficiency: Secondary | ICD-10-CM | POA: Insufficient documentation

## 2017-01-26 DIAGNOSIS — R0600 Dyspnea, unspecified: Secondary | ICD-10-CM

## 2017-01-26 LAB — ECHOCARDIOGRAM COMPLETE
Ao-asc: 27 cm
E decel time: 229 msec
E/e' ratio: 10.72
FS: 45 % — AB (ref 28–44)
IVS/LV PW RATIO, ED: 1.47
LA ID, A-P, ES: 39 mm
LA diam end sys: 39 mm
LA diam index: 2.38 cm/m2
LA vol A4C: 33.4 ml
LA vol index: 20 mL/m2
LA vol: 32.8 mL
LV E/e' medial: 10.72
LV E/e'average: 10.72
LV PW d: 8.39 mm — AB (ref 0.6–1.1)
LV e' LATERAL: 7.8 cm/s
LVOT SV: 50 mL
LVOT VTI: 21.9 cm
LVOT area: 2.27 cm2
LVOT diameter: 17 mm
LVOT peak vel: 94.7 cm/s
Lateral S' vel: 11.3 cm/s
MV Dec: 229
MV Peak grad: 3 mmHg
MV pk A vel: 97.1 m/s
MV pk E vel: 83.6 m/s
PV Reg vel dias: 89 cm/s
Reg peak vel: 233 cm/s
TAPSE: 22.8 mm
TDI e' lateral: 7.8
TDI e' medial: 5.61
TR max vel: 233 cm/s

## 2017-01-27 ENCOUNTER — Ambulatory Visit: Payer: Medicare Other | Admitting: Physical Therapy

## 2017-01-27 ENCOUNTER — Telehealth: Payer: Self-pay | Admitting: Interventional Cardiology

## 2017-01-27 DIAGNOSIS — M6281 Muscle weakness (generalized): Secondary | ICD-10-CM | POA: Diagnosis not present

## 2017-01-27 DIAGNOSIS — R293 Abnormal posture: Secondary | ICD-10-CM | POA: Diagnosis not present

## 2017-01-27 DIAGNOSIS — R0600 Dyspnea, unspecified: Secondary | ICD-10-CM

## 2017-01-27 MED ORDER — SPIRONOLACTONE 25 MG PO TABS: 12.5000 mg | ORAL_TABLET | Freq: Every day | ORAL | 3 refills | Status: DC

## 2017-01-27 NOTE — Therapy (Signed)
[facility] [address] [place], [place], [address] Phone: [phone]   Fax:  [phone]  Physical Therapy Treatment  Patient Details  Name: Janet [name] MRN: [id number] Date of Birth: May 21, 1932 Referring Provider: Dr. Floyde Parkins  Encounter Date: 01/27/2017      PT End of Session - 01/28/17 1638    Visit Number 4   Number of Visits 9   Date for PT Re-Evaluation 03/12/17   Authorization Type Medicare   Authorization Time Period 01-13-17 - 03-12-17   PT Start Time 1110   PT Stop Time 1149   PT Time Calculation (min) 39 min      Past Medical History:  Diagnosis Date  . Cancer [facility])    uterine cancer  . Depression   . 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 Dyslipidemia   . Lumbar radicular syndrome    left L5  . Macular degeneration    left eye    Past Surgical History:  Procedure Laterality Date  . ABDOMINAL HYSTERECTOMY    . TONSILLECTOMY      There were no vitals filed for this visit.      Subjective Assessment - 01/28/17 1610    Subjective Pt states she tried lying across bed with head off and lifting head up but states bed is too soft and she has a very difficult time doing this exercise   Pertinent History Dropped head syndrome 11-16-14:  lumbar radicular syndrome; dyspnea   Patient Stated Goals "I want to hang my head less often"   Currently in Pain? No/denies         TherEx:  Prone - arms extended by side  - cervical extension x 10 reps  Quadruped position - cervical extension x 10 reps                    [facility] Adult PT Treatment/Exercise - 01/28/17 0001      Ambulation/Gait   Ambulation/Gait Yes   Ambulation Distance (Feet) 240 Feet   Assistive device None   Gait Comments Used gait training for strengthening cervical extensors with pt holding ball overhead for 2 laps (240') with cues to hold head upright:                                                             Exercises   Exercises Neck;Shoulder     Neck Exercises: Standing   Neck Retraction 10 reps  seated position - pushing into therapist's hand for feedback   Neck Retraction Limitations weakness in cervical musc.     Neck Exercises: Seated   Neck Retraction 10 reps     Neck Exercises: Prone   Axial Exentsion Other reps (comment)  3 sets 10 reps - assistance on final 2 sets for complete ROM   Other Prone Exercise Pt performed cervical extension in quadruped position x 10 reps; pt performed thoracic extension with arms down by side x 10 reps:  performed extension with 1 arm flexed, overhead with thoracic and cervical extension x 10 reps with each arm overhead     Shoulder Exercises: Seated   Other Seated Exercises Cervical extension with blue theraband x 15 reps      scapular retraction - pt prone - arms by side x 10  reps;  Flexed at elbows ('W") x 10 reps for scapular retraction                PT Long Term Goals - 01/14/17 2035      PT LONG TERM GOAL #1   Title Pt will demonstrate improved cervical extension strength by holding head upright for at least 20" nonstop during PT session.  (02-12-17)   Time 4   Period Weeks   Status New     PT LONG TERM GOAL #2   Title Pt will be independent in HEP for cervical strengthening, stretching and ROM exercises. (02-12-17)   Time 4   Period Weeks   Status New     PT LONG TERM GOAL #3   Title Pt will report at least 50% in ability to hold head upright for improved posture.  (02-12-17)   Time 4   Period Weeks   Status New               Plan - 01/28/17 1639    Clinical Impression Statement Pt continues to demonstrate cervical extensor weakness resulting in postural abnormality and difficulty holding head upright for prolonged periods of time; pt continues to fatigue with exercises requiring UE weightbearing such as in quadruped position   Rehab Potential Good   PT Frequency 2x / week   PT Duration 4 weeks   PT  Treatment/Interventions ADLs/Self Care Home Management;Cryotherapy;Moist Heat;Electrical Stimulation;Ultrasound;Therapeutic activities;Therapeutic exercise;Neuromuscular re-education;Patient/family education;Manual techniques;Passive range of motion   PT Next Visit Plan continue cervical strengthening - try propping on forearms - cervical extension with 1 arm straight out in front - weight shift   PT Home Exercise Plan see pt instructions; additional exs added on 01-20-17   Consulted and Agree with Plan of Care Patient      Patient will benefit from skilled therapeutic intervention in order to improve the following deficits and impairments:  Postural dysfunction, Decreased strength, Decreased range of motion, Impaired flexibility, Impaired vision/preception  Visit Diagnosis: Muscle weakness (generalized)  Abnormal posture     Problem List Patient Active Problem List   Diagnosis Date Noted  . Chest pain 01/08/2017  . Dyspnea 01/08/2017  . Memory difficulty 10/30/2016  . 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 [place], [place], [address] Phone: [phone]   Fax:  [phone]  Name: Janet [name] MRN: [id number] Date of Birth: 10/16/1932

## 2017-01-27 NOTE — Telephone Encounter (Signed)
New message   Pt daughter lois myers calling about echo results and said that   pt is confused on the results and what medication she is to take  She wants a call back

## 2017-01-27 NOTE — Telephone Encounter (Signed)
Notes Recorded by Belva Crome, MD on 01/27/2017 at 10:18 AM EST Let the patient know that both the blood test and echocardiogram are not very revealing. There is evidence on echo of increased heart stiffness which may be associated with shortness of breath. Start Aldactone 12.5 mg daily. Basic metabolic panel in 0000000 days. Office visit in 4-6 weeks to assess if breathing has improved.   Spoke with pt and went over results and new orders per Dr. Tamala Julian. Pt in agreement to come into our office on 09/04/17 for labs.  Scheduled pt to see Dr. Tamala Julian on 03/05/17.  Pt verbalized understanding and was in agreement with this plan.

## 2017-01-27 NOTE — Telephone Encounter (Signed)
Spoke with daughter, DPR on file.  Went over echo results and recommendations per Dr. Tamala Julian.  Daughter verbalized understanding and was appreciative for assistance.

## 2017-01-27 NOTE — Telephone Encounter (Signed)
New Message      Returning your call about the echo results from 01/26/17

## 2017-01-28 ENCOUNTER — Telehealth: Payer: Self-pay | Admitting: Interventional Cardiology

## 2017-01-28 NOTE — Telephone Encounter (Signed)
New Message    Pt would like to know if she can start her Rx spironolactone (ALDACTONE) 25 MG tablet later because she is taking other medications that she feels it would interact with but she wants to confirm that this is okay.

## 2017-01-28 NOTE — Telephone Encounter (Signed)
Daughter called concerned about pt starting Aldactone right now.  Pt having some memory issues so PCP has prescribed Namenda and they are working on titrating this.  Daughter states they were told Namenda can cause dizziness and pt is driving to therapy twice a week.  Also concerned about teaching pt about taking two new medications at the same time.  Daughter wants to know if it would be ok to wait 2 weeks before starting the Aldactone?  She said if Dr. Tamala Julian felt this needs to be started right away, then they will.  Will route to Dr. Tamala Julian for advisement.

## 2017-01-28 NOTE — Telephone Encounter (Signed)
Spoke with pt and advised her to go ahead and start the Aldactone.  No concern with interaction.  Pt verbalized understanding and was in agreement with this plan.

## 2017-01-29 ENCOUNTER — Ambulatory Visit: Payer: Medicare Other | Admitting: Physical Therapy

## 2017-01-29 DIAGNOSIS — R293 Abnormal posture: Secondary | ICD-10-CM

## 2017-01-29 DIAGNOSIS — M6281 Muscle weakness (generalized): Secondary | ICD-10-CM | POA: Diagnosis not present

## 2017-01-29 NOTE — Therapy (Signed)
[facility] [address] [place], [place], [address] Phone: [phone]   Fax:  [phone]  Physical Therapy Treatment  Patient Details  Name: Janet [name] MRN: [id number] Date of Birth: 1932-02-11 Referring Provider: Dr. Floyde Parkins  Encounter Date: 01/29/2017      PT End of Session - 01/30/17 1010    Visit Number 5  G5   Number of Visits 9   Date for PT Re-Evaluation 03/12/17   Authorization Type Medicare   Authorization Time Period 01-13-17 - 03-12-17   PT Start Time 1106   PT Stop Time 1151   PT Time Calculation (min) 45 min      Past Medical History:  Diagnosis Date  . Cancer [facility])    uterine cancer  . Depression   . 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 Dyslipidemia   . Lumbar radicular syndrome    left L5  . Macular degeneration    left eye    Past Surgical History:  Procedure Laterality Date  . ABDOMINAL HYSTERECTOMY    . TONSILLECTOMY      There were no vitals filed for this visit.      Subjective Assessment - 01/30/17 1009    Subjective Pt reports some friends have told her that it looks like she is holding her head up better   Pertinent History Dropped head syndrome 11-16-14:  lumbar radicular syndrome; dyspnea   Patient Stated Goals "I want to hang my head less often"   Currently in Pain? No/denies             TherEx:   Cervical extension 3 sets 10 reps - pt in prone position  Propped on forearms - cervical extension 2 sets 10 reps - in prone position  Prone -- scapular retraction - prone - arms by side -10 reps lifting arm overhead - 5 reps each side - pt in prone;  Added cervical extension with min to mod assist to complete final third of AROM -  lifting arm and head simultaneously  "Superman"  10 reps - with cervical extension x 10 reps Arms extended - lift head up 7 reps only due to fatigue  Pt supine - cervical retraction - PT's hand under head for tactile feedback  - 10 reps 3 sec hold  Seated position - pt performed cervical extension with grey theraband 10 reps 3 sec hold each  Pt amb. 120' x 2 reps holding ball overhead for cervical extension strengthening - CGA for safety  Pt performed zoom ball - standing inside // bars for safety but no LOB occurred - 10 reps horizontal, 10 reps each diagonal  "X" for  scapular and cervical extensor strengthening                   PT Long Term Goals - 01/14/17 2035      PT LONG TERM GOAL #1   Title Pt will demonstrate improved cervical extension strength by holding head upright for at least 20" nonstop during PT session.  (02-12-17)   Time 4   Period Weeks   Status New     PT LONG TERM GOAL #2   Title Pt will be independent in HEP for cervical strengthening, stretching and ROM exercises. (02-12-17)   Time 4   Period Weeks   Status New     PT LONG TERM GOAL #3   Title Pt will report at least 50% in ability to hold head upright for improved posture.  (  02-12-17)   Time 4   Period Weeks   Status New               Plan - 01/30/17 1011    Clinical Impression Statement Pt progressing with improving cervical extensor strength - continues to need assistance to fully complete cervical extension in anti-gravity positions for last 1/3 of ROM;  pt demonstrates decreased muscle endurance in UE's as evidenced by tremors in UE's after performing weight bearing exercises for cervical musc. strengthening   Rehab Potential Good   PT Frequency 2x / week   PT Duration 4 weeks   PT Treatment/Interventions ADLs/Self Care Home Management;Cryotherapy;Moist Heat;Electrical Stimulation;Ultrasound;Therapeutic activities;Therapeutic exercise;Neuromuscular re-education;Patient/family education;Manual techniques;Passive range of motion   PT Next Visit Plan continue cervical strengthening - try propping on forearms - cervical extension with 1 arm straight out in front - weight shift   PT Home Exercise Plan see pt  instructions; additional exs added on 01-20-17   Consulted and Agree with Plan of Care Patient      Patient will benefit from skilled therapeutic intervention in order to improve the following deficits and impairments:  Postural dysfunction, Decreased strength, Decreased range of motion, Impaired flexibility, Impaired vision/preception  Visit Diagnosis: Muscle weakness (generalized)  Abnormal posture     Problem List Patient Active Problem List   Diagnosis Date Noted  . Chest pain 01/08/2017  . Dyspnea 01/08/2017  . Memory difficulty 10/30/2016  . 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 [place], [place], [address] Phone: [phone]   Fax:  [phone]  Name: Janet [name] MRN: [id number] Date of Birth: [birth year]

## 2017-01-30 ENCOUNTER — Telehealth: Payer: Self-pay | Admitting: Neurology

## 2017-01-30 MED ORDER — MEMANTINE HCL 10 MG PO TABS: 10.0000 mg | ORAL_TABLET | Freq: Two times a day (BID) | ORAL | 1 refills | Status: DC

## 2017-01-30 NOTE — Telephone Encounter (Signed)
Patients daughter called office requesting a refill for memantine (NAMENDA TITRATION PAK) tablet pack.  [employer].

## 2017-01-30 NOTE — Telephone Encounter (Signed)
Okay to hold therapy.

## 2017-01-30 NOTE — Telephone Encounter (Signed)
Spoke to daughter, Janet [name].  Pt has tolerated the namenda titration pack.  Is taking the 5mg  tabs (2 tabs po bid).  I told her that will send in new prescription to [facility] for the 10mg  tabs (1 tab po bid).  She verbalized understanding.

## 2017-01-30 NOTE — Addendum Note (Signed)
Addended byOliver Hum on: 01/30/2017 09:00 AM   Modules accepted: Orders

## 2017-02-02 NOTE — Telephone Encounter (Signed)
Spoke to daughter and made her aware that Dr. Tamala Julian said ok to hold off for now.  Advised daughter to call me when they do start the medication so we can coordinate labs and follow up appt.  Daughter appreciative for assistance.

## 2017-02-02 NOTE — Telephone Encounter (Signed)
Left message to call back  

## 2017-02-03 ENCOUNTER — Ambulatory Visit: Payer: Medicare Other | Admitting: Physical Therapy

## 2017-02-03 DIAGNOSIS — R293 Abnormal posture: Secondary | ICD-10-CM | POA: Diagnosis not present

## 2017-02-03 DIAGNOSIS — M6281 Muscle weakness (generalized): Secondary | ICD-10-CM | POA: Diagnosis not present

## 2017-02-04 ENCOUNTER — Other Ambulatory Visit: Payer: Medicare Other

## 2017-02-04 DIAGNOSIS — M20012 Mallet finger of left finger(s): Secondary | ICD-10-CM | POA: Diagnosis not present

## 2017-02-04 NOTE — Therapy (Signed)
[facility] [address] [place], [place], [address] Phone: [phone]   Fax:  [phone]  Physical Therapy Treatment  Patient Details  Name: Janet [name] MRN: [id number] Date of Birth: Aug 13, 1932 Referring Provider: Dr. Floyde Parkins  Encounter Date: 02/03/2017      PT End of Session - 02/04/17 1957    Visit Number 6  G6   Number of Visits 9   Date for PT Re-Evaluation 03/12/17   Authorization Type Medicare   Authorization Time Period 01-13-17 - 03-12-17   PT Start Time 1105   PT Stop Time 1146   PT Time Calculation (min) 41 min      Past Medical History:  Diagnosis Date  . Cancer [facility])    uterine cancer  . Depression   . 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 Dyslipidemia   . Lumbar radicular syndrome    left L5  . Macular degeneration    left eye    Past Surgical History:  Procedure Laterality Date  . ABDOMINAL HYSTERECTOMY    . TONSILLECTOMY      There were no vitals filed for this visit.      Subjective Assessment - 02/04/17 1955    Subjective "Its been kinda of wild and I've neglected my work"   Pertinent History Dropped head syndrome 11-16-14:  lumbar radicular syndrome; dyspnea   Patient Stated Goals "I want to hang my head less often"   Currently in Pain? No/denies                         [facility] Adult PT Treatment/Exercise - 02/04/17 0001      Ambulation/Gait   Ambulation/Gait Yes   Ambulation Distance (Feet) 240 Feet   Assistive device None   Gait Comments Used gait training for strengthening cervical extensors with pt holding ball overhead for 2 laps (240') with cues to hold head upright:                                                             Exercises   Exercises Neck;Shoulder     Neck Exercises: Theraband   Scapula Retraction 10 reps;Green  seated position - elbows bent at 90 degrees   Other Theraband Exercises Pt performed cervical extension with blue  theraband - seated in chair for back support:  2 sets 10 reps with 3 sec hold     Neck Exercises: Standing   Neck Retraction 10 reps  seated position - pushing into therapist's hand for feedback   Neck Retraction Limitations weakness in cervical musc.     Neck Exercises: Seated   Neck Retraction 10 reps   W Back 10 reps     Neck Exercises: Prone   Axial Exentsion Other reps (comment)  3 sets 10 reps - assistance on final 2 sets for complete ROM   Other Prone Exercise Pt performed cervical extension in quadruped position x 10 reps; pt performed thoracic extension with arms down by side x 10 reps:  performed extension with 1 arm flexed, overhead with thoracic and cervical extension x 10 reps with each arm overhead      Quadruped - pt performed cervical extension 10 reps x 2 sets with mod assist on 2nd set performing final  1/3 of active Cervical extension               PT Long Term Goals - 01/14/17 2035      PT LONG TERM GOAL #1   Title Pt will demonstrate improved cervical extension strength by holding head upright for at least 20" nonstop during PT session.  (02-12-17)   Time 4   Period Weeks   Status New     PT LONG TERM GOAL #2   Title Pt will be independent in HEP for cervical strengthening, stretching and ROM exercises. (02-12-17)   Time 4   Period Weeks   Status New     PT LONG TERM GOAL #3   Title Pt will report at least 50% in ability to hold head upright for improved posture.  (02-12-17)   Time 4   Period Weeks   Status New               Plan - 02/04/17 1958    Clinical Impression Statement Pt progressing towards goals - continues to have difficulty performing cervical extension in final 1/3 of AROM due to cervical musc. weakness   Rehab Potential Good   PT Frequency 2x / week   PT Duration 4 weeks   PT Treatment/Interventions ADLs/Self Care Home Management;Cryotherapy;Moist Heat;Electrical Stimulation;Ultrasound;Therapeutic activities;Therapeutic  exercise;Neuromuscular re-education;Patient/family education;Manual techniques;Passive range of motion   PT Next Visit Plan continue cervical strengthening - try propping on forearms - cervical extension with 1 arm straight out in front - weight shift   PT Home Exercise Plan see pt instructions; additional exs added on 01-20-17   Consulted and Agree with Plan of Care Patient      Patient will benefit from skilled therapeutic intervention in order to improve the following deficits and impairments:  Postural dysfunction, Decreased strength, Decreased range of motion, Impaired flexibility, Impaired vision/preception  Visit Diagnosis: Muscle weakness (generalized)  Abnormal posture     Problem List Patient Active Problem List   Diagnosis Date Noted  . Chest pain 01/08/2017  . Dyspnea 01/08/2017  . Memory difficulty 10/30/2016  . 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

## 2017-02-05 ENCOUNTER — Ambulatory Visit: Payer: Medicare Other | Admitting: Physical Therapy

## 2017-02-05 DIAGNOSIS — R293 Abnormal posture: Secondary | ICD-10-CM

## 2017-02-05 DIAGNOSIS — M6281 Muscle weakness (generalized): Secondary | ICD-10-CM

## 2017-02-06 NOTE — Therapy (Signed)
[facility] [address] [place], [place], [address] Phone: [phone]   Fax:  [phone]  Physical Therapy Treatment  Patient Details  Name: Janet [name] MRN: [id number] Date of Birth: March 04, 1932 Referring Provider: Dr. Floyde Parkins  Encounter Date: 02/05/2017      PT End of Session - 02/06/17 1122    Visit Number 7   Number of Visits 9   Date for PT Re-Evaluation 03/12/17   Authorization Type Medicare   Authorization Time Period 01-13-17 - 03-12-17   PT Start Time 1105   PT Stop Time 1152   PT Time Calculation (min) 47 min      Past Medical History:  Diagnosis Date  . Cancer [facility])    uterine cancer  . Depression   . 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 Dyslipidemia   . Lumbar radicular syndrome    left L5  . Macular degeneration    left eye    Past Surgical History:  Procedure Laterality Date  . ABDOMINAL HYSTERECTOMY    . TONSILLECTOMY      There were no vitals filed for this visit.      Subjective Assessment - 02/06/17 1120    Subjective Pt states she still has had alot going on at home with different CNA's coming in for her husband and states she has not had time to really do exercises   Pertinent History Dropped head syndrome 11-16-14:  lumbar radicular syndrome; dyspnea   Patient Stated Goals "I want to hang my head less often"   Currently in Pain? No/denies                         [facility] Adult PT Treatment/Exercise - 02/06/17 0001      Ambulation/Gait   Ambulation/Gait Yes   Ambulation Distance (Feet) 240 Feet   Assistive device None   Gait Comments Used gait training for strengthening cervical extensors with pt holding ball overhead for 2 laps (240') with cues to hold head upright:                                                             Exercises   Exercises Neck;Shoulder     Neck Exercises: Theraband   Scapula Retraction 10 reps;Green  seated position - elbows  bent at 90 degrees     Neck Exercises: Supine   Neck Retraction 20 reps   Neck Retraction Limitations weakness of cervical extensors     Neck Exercises: Prone   Axial Exentsion 20 reps   Other Prone Exercise Pt performed cervical extension in quadruped position x 10 reps; pt performed thoracic extension with arms down by side x 10 reps:  performed extension with 1 arm flexed, overhead with thoracic and cervical extension x 10 reps with each arm overhead      Propped on forearms - cervical extension 2 sets 10 reps - in prone position; added lifting one arm up, with cervical extension - 10 reps each side  Prone -- scapular retraction - prone - arms by side -10 reps lifting arm overhead - 5 reps each side - pt in prone;  Added cervical extension with min to mod assist to complete final 25% of AROM -  lifting arm and head  simultaneously  "Superman"  10 reps - with cervical extension x 10 reps                 PT Long Term Goals - 01/14/17 2035      PT LONG TERM GOAL #1   Title Pt will demonstrate improved cervical extension strength by holding head upright for at least 20" nonstop during PT session.  (02-12-17)   Time 4   Period Weeks   Status New     PT LONG TERM GOAL #2   Title Pt will be independent in HEP for cervical strengthening, stretching and ROM exercises. (02-12-17)   Time 4   Period Weeks   Status New     PT LONG TERM GOAL #3   Title Pt will report at least 50% in ability to hold head upright for improved posture.  (02-12-17)   Time 4   Period Weeks   Status New               Plan - 02/06/17 1123    Clinical Impression Statement Pt slowly improving with increasing cervical extensor strength but continues to be unable to extend in final 25% end ROM; this has slightly improved as pt is actively extending further, pt previously needed assistance in final 1/3 ROM   Rehab Potential Good   PT Frequency 2x / week   PT Duration 4 weeks   PT  Treatment/Interventions ADLs/Self Care Home Management;Cryotherapy;Moist Heat;Electrical Stimulation;Ultrasound;Therapeutic activities;Therapeutic exercise;Neuromuscular re-education;Patient/family education;Manual techniques;Passive range of motion   PT Next Visit Plan cont cervical strengthening   PT Home Exercise Plan see pt instructions; additional exs added on 01-20-17   Consulted and Agree with Plan of Care Patient      Patient will benefit from skilled therapeutic intervention in order to improve the following deficits and impairments:  Postural dysfunction, Decreased strength, Decreased range of motion, Impaired flexibility, Impaired vision/preception  Visit Diagnosis: Muscle weakness (generalized)  Abnormal posture     Problem List Patient Active Problem List   Diagnosis Date Noted  . Chest pain 01/08/2017  . Dyspnea 01/08/2017  . Memory difficulty 10/30/2016  . 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

## 2017-02-10 ENCOUNTER — Ambulatory Visit: Payer: Medicare Other | Admitting: Physical Therapy

## 2017-02-10 DIAGNOSIS — M6281 Muscle weakness (generalized): Secondary | ICD-10-CM

## 2017-02-10 DIAGNOSIS — R293 Abnormal posture: Secondary | ICD-10-CM

## 2017-02-10 NOTE — Therapy (Signed)
[facility] [address] [place], [place], [address] Phone: [phone]   Fax:  [phone]  Physical Therapy Treatment  Patient Details  Name: Janet [name] MRN: [id number] Date of Birth: December 26, 1931 Referring Provider: Dr. Floyde Parkins  Encounter Date: 02/10/2017      PT End of Session - 02/10/17 2222    Visit Number 8   Number of Visits 9   Date for PT Re-Evaluation 03/12/17   Authorization Type Medicare   Authorization Time Period 01-13-17 - 03-12-17   PT Start Time 1106   PT Stop Time 1147   PT Time Calculation (min) 41 min      Past Medical History:  Diagnosis Date  . Cancer [facility])    uterine cancer  . Depression   . 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 Dyslipidemia   . Lumbar radicular syndrome    left L5  . Macular degeneration    left eye    Past Surgical History:  Procedure Laterality Date  . ABDOMINAL HYSTERECTOMY    . TONSILLECTOMY      There were no vitals filed for this visit.      Subjective Assessment - 02/10/17 2219    Subjective Pt states she continues to have a hard time diligently working on exercises at home - has alot going on with her husband   Pertinent History Dropped head syndrome 11-16-14:  lumbar radicular syndrome; dyspnea   Patient Stated Goals "I want to hang my head less often"   Currently in Pain? No/denies                         [facility] Adult PT Treatment/Exercise - 02/10/17 1109      Neck Exercises: Theraband   Scapula Retraction 10 reps;Green  seated position - elbows bent at 90 degrees   Other Theraband Exercises Pt performed cervical extension with grey theraband - seated in chair for back support:  2 sets 10 reps with 3 sec hold     Neck Exercises: Standing   Neck Retraction 10 reps  seated position - pushing into therapist's hand for feedback     Neck Exercises: Seated   Neck Retraction 10 reps   W Back 10 reps     Neck Exercises: Prone   Axial Exentsion 20 reps  head off end of mat   Other Prone Exercise Pt performed cervical extension in quadruped position x 10 reps; pt performed thoracic extension with arms down by side x 10 reps:  performed extension with 1 arm flexed, overhead with thoracic and cervical extension x 10 reps with each arm overhead                     PT Long Term Goals - 02/10/17 2223      PT LONG TERM GOAL #1   Title Pt will demonstrate improved cervical extension strength by holding head upright for at least 20" nonstop during PT session.  (02-12-17)   Status On-going     PT LONG TERM GOAL #2   Title Pt will be independent in HEP for cervical strengthening, stretching and ROM exercises. (02-12-17)   Status Achieved     PT LONG TERM GOAL #3   Title Pt will report at least 50% in ability to hold head upright for improved posture.  (02-12-17)   Baseline pt reports approx. 25% improvement   Time 4   Period Weeks   Status On-going  Plan - 02/10/17 2224    Clinical Impression Statement Pt has met LTG #2 with LTG's #1 and 3 ongoing; pt reports 25% improvement in cervical extension, not 50% per stated goal.  Pt is progressing with increasing cervical strength, resulting in improvement in holding head upright.   Rehab Potential Good   PT Frequency 2x / week   PT Duration 4 weeks   PT Treatment/Interventions ADLs/Self Care Home Management;Cryotherapy;Moist Heat;Electrical Stimulation;Ultrasound;Therapeutic activities;Therapeutic exercise;Neuromuscular re-education;Patient/family education;Manual techniques;Passive range of motion   PT Next Visit Plan cont cervical strengthening   PT Home Exercise Plan see pt instructions; additional exs added on 01-20-17   Consulted and Agree with Plan of Care Patient      Patient will benefit from skilled therapeutic intervention in order to improve the following deficits and impairments:  Postural dysfunction, Decreased strength, Decreased  range of motion, Impaired flexibility, Impaired vision/preception  Visit Diagnosis: Muscle weakness (generalized)  Abnormal posture     Problem List Patient Active Problem List   Diagnosis Date Noted  . Chest pain 01/08/2017  . Dyspnea 01/08/2017  . Memory difficulty 10/30/2016  . 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 [place], [place], [address] Phone: [phone]   Fax:  [phone]  Name: Janet [name] MRN: [id number] Date of Birth: 02-28-1932

## 2017-02-12 ENCOUNTER — Ambulatory Visit: Payer: Medicare Other | Admitting: Physical Therapy

## 2017-02-17 DIAGNOSIS — L245 Irritant contact dermatitis due to other chemical products: Secondary | ICD-10-CM | POA: Diagnosis not present

## 2017-02-17 DIAGNOSIS — D1801 Hemangioma of skin and subcutaneous tissue: Secondary | ICD-10-CM | POA: Diagnosis not present

## 2017-02-23 ENCOUNTER — Ambulatory Visit: Payer: Medicare Other | Admitting: Physical Therapy

## 2017-03-02 ENCOUNTER — Ambulatory Visit: Payer: Medicare Other | Attending: Neurology | Admitting: Physical Therapy

## 2017-03-02 DIAGNOSIS — R293 Abnormal posture: Secondary | ICD-10-CM | POA: Diagnosis not present

## 2017-03-02 DIAGNOSIS — M6281 Muscle weakness (generalized): Secondary | ICD-10-CM | POA: Diagnosis not present

## 2017-03-02 NOTE — Therapy (Signed)
[facility] [address] [place], [place], [address] Phone: [phone]   Fax:  [phone]  Physical Therapy Treatment  Patient Details  Name: Janet [name] MRN: [id number] Date of Birth: 1932-01-21 Referring Provider: Dr. Floyde Parkins  Encounter Date: 03/02/2017      PT End of Session - 03/02/17 1225    Visit Number 9  G10   Number of Visits 12   Date for PT Re-Evaluation 04/02/17   Authorization Type Medicare   Authorization Time Period 03-02-17 - 05-01-17   PT Start Time 1105   PT Stop Time 1150   PT Time Calculation (min) 45 min      Past Medical History:  Diagnosis Date  . Cancer [facility])    uterine cancer  . Depression   . 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 Dyslipidemia   . Lumbar radicular syndrome    left L5  . Macular degeneration    left eye    Past Surgical History:  Procedure Laterality Date  . ABDOMINAL HYSTERECTOMY    . TONSILLECTOMY      There were no vitals filed for this visit.      Subjective Assessment - 03/02/17 1222    Subjective Pt states she is doing exercises at home some, "but not as much as I should"   Pertinent History Dropped head syndrome 11-16-14:  lumbar radicular syndrome; dyspnea   Patient Stated Goals "I want to hang my head less often"   Currently in Pain? No/denies                         [facility] Adult PT Treatment/Exercise - 03/02/17 1223      Neck Exercises: Seated   Neck Retraction 10 reps   Other Seated Exercise Pt performed scapular retraction exercises with elbows bent at 90, elbows extended in horizontal abduciton, and each diagonal pattern 10 reps each     Neck Exercises: Prone   Axial Exentsion 20 reps   W Back 10 reps   Shoulder Extension 10 reps   Other Prone Exercise Pt performed cervical extension in quadruped position x 10 reps; pt performed thoracic extension with arms down by side x 10 reps:  performed extension with 1 arm flexed,  overhead with thoracic and cervical extension x 10 reps with each arm overhead     Pt performed cervical extension in tall kneeling position x 10 reps with min assist to stabilize  Pt performed cervical extension in prone - lifting one arm overhead with cervical extension  Pt performed UBE level 4 x 2 1/2" forward, then 2" backward level 3 for scapular and cervical strengthening  Pt amb. 230' holding ball overhead for fascilitation of upright posture and with cues to hold head up for erect posture               PT Long Term Goals - 03/02/17 1227      PT LONG TERM GOAL #1   Title Pt will demonstrate improved cervical extension strength by holding head upright for at least 20" nonstop during PT session.  (02-12-17)/ 04-02-17  Extend til 04-02-17   Baseline Progressing - pt able to hold head upright with consciously thinking about it   Time 4   Period Weeks   Status On-going     PT LONG TERM GOAL #2   Title Pt will be independent in HEP for cervical strengthening, stretching and ROM exercises. (02-12-17)/04-02-17   Status  Achieved     PT LONG TERM GOAL #3   Title Pt will report at least 50% in ability to hold head upright for improved posture.  (02-12-17)/ 04-02-17   Baseline pt reports approx. 25% improvement   Status On-going               Plan - 03/31/17 1239    Clinical Impression Statement LTG's #1 and 3 remain ongoing:  pt is slowly improving with holding head upright; pt able to hold head upright better when consciously thinking about doing so;  Renewal completed for 4 additional visits   Rehab Potential Good   PT Frequency 1x / week   PT Duration 4 weeks   PT Treatment/Interventions ADLs/Self Care Home Management;Cryotherapy;Moist Heat;Electrical Stimulation;Ultrasound;Therapeutic activities;Therapeutic exercise;Neuromuscular re-education;Patient/family education;Manual techniques;Passive range of motion   PT Next Visit Plan cont cervical strengthening   PT Home  Exercise Plan see pt instructions; additional exs added on 01-20-17   Consulted and Agree with Plan of Care Patient      Patient will benefit from skilled therapeutic intervention in order to improve the following deficits and impairments:  Postural dysfunction, Decreased strength, Decreased range of motion, Impaired flexibility, Impaired vision/preception  Visit Diagnosis: Muscle weakness (generalized) - Plan: PT plan of care cert/re-cert  Abnormal posture - Plan: PT plan of care cert/re-cert       G-Codes - 03/31/2017 1241    Functional Assessment Tool Used (Outpatient Only) clinical judgment - pt presents with abnormal posture   Functional Limitation Changing and maintaining body position   Changing and Maintaining Body Position Current Status (V9480) At least 40 percent but less than 60 percent impaired, limited or restricted   Changing and Maintaining Body Position Goal Status (X6553) At least 20 percent but less than 40 percent impaired, limited or restricted      Problem List Patient Active Problem List   Diagnosis Date Noted  . Chest pain 01/08/2017  . Dyspnea 01/08/2017  . Memory difficulty 10/30/2016  . 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

## 2017-03-04 ENCOUNTER — Encounter: Payer: Self-pay | Admitting: Internal Medicine

## 2017-03-04 DIAGNOSIS — H4322 Crystalline deposits in vitreous body, left eye: Secondary | ICD-10-CM | POA: Diagnosis not present

## 2017-03-04 DIAGNOSIS — H26493 Other secondary cataract, bilateral: Secondary | ICD-10-CM | POA: Diagnosis not present

## 2017-03-04 DIAGNOSIS — H353131 Nonexudative age-related macular degeneration, bilateral, early dry stage: Secondary | ICD-10-CM | POA: Diagnosis not present

## 2017-03-04 DIAGNOSIS — H52203 Unspecified astigmatism, bilateral: Secondary | ICD-10-CM | POA: Diagnosis not present

## 2017-03-05 ENCOUNTER — Ambulatory Visit: Payer: Medicare Other | Admitting: Interventional Cardiology

## 2017-03-09 ENCOUNTER — Ambulatory Visit: Payer: Medicare Other | Admitting: Physical Therapy

## 2017-03-09 DIAGNOSIS — M6281 Muscle weakness (generalized): Secondary | ICD-10-CM

## 2017-03-09 DIAGNOSIS — R293 Abnormal posture: Secondary | ICD-10-CM

## 2017-03-10 NOTE — Therapy (Signed)
[name] [address] [place], [place], [address] Phone: [phone]   Fax:  [phone]  Physical Therapy Treatment  Patient Details  Name: Janet [name] MRN: [id number] Date of Birth: [birth year] Referring Provider: Dr. Floyde Parkins  Encounter Date: 03/09/2017      PT End of Session - 03/10/17 2146    Visit Number 10  G1   Number of Visits 12   Date for PT Re-Evaluation 04/02/17   Authorization Type Medicare   Authorization Time Period 03-02-17 - 05-01-17      Past Medical History:  Diagnosis Date  . Cancer [facility])    uterine cancer  . Depression   . 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 Dyslipidemia   . Lumbar radicular syndrome    left L5  . Macular degeneration    left eye    Past Surgical History:  Procedure Laterality Date  . ABDOMINAL HYSTERECTOMY    . TONSILLECTOMY      There were no vitals filed for this visit.      Subjective Assessment - 03/10/17 2136    Subjective Pt reports she has not had a lot of time to do exercises at home - "not as much as I should"   Pertinent History Dropped head syndrome 11-16-14:  lumbar radicular syndrome; dyspnea   Patient Stated Goals "I want to hang my head less often"   Currently in Pain? No/denies                         [facility] Adult PT Treatment/Exercise - 03/10/17 0001      Ambulation/Gait   Ambulation/Gait Yes   Ambulation/Gait Assistance 5: Supervision   Ambulation Distance (Feet) 230 Feet   Assistive device None   Gait Comments pt held ball overhead to fascilitate strengthening cervical extensors     Exercises   Exercises Neck;Shoulder     Neck Exercises: Theraband   Scapula Retraction 10 reps;Green  seated position - elbows bent at 90 degrees   Other Theraband Exercises Pt performed cervical extension with grey theraband - seated in chair for back support:  2 sets 10 reps with 3 sec hold     Neck Exercises: Seated   Neck  Retraction 10 reps     Neck Exercises: Prone   Axial Exentsion 20 reps   W Back 10 reps   Shoulder Extension 10 reps   Other Prone Exercise Pt performed cervical extension in quadruped position x 10 reps; pt performed thoracic extension with arms down by side x 10 reps:  performed extension with 1 arm flexed, overhead with thoracic and cervical extension x 10 reps with each arm overhead                     PT Long Term Goals - 03/02/17 1227      PT LONG TERM GOAL #1   Title Pt will demonstrate improved cervical extension strength by holding head upright for at least 20" nonstop during PT session.  (02-12-17)/ 04-02-17  Extend til 04-02-17   Baseline Progressing - pt able to hold head upright with consciously thinking about it   Time 4   Period Weeks   Status On-going     PT LONG TERM GOAL #2   Title Pt will be independent in HEP for cervical strengthening, stretching and ROM exercises. (02-12-17)/04-02-17   Status Achieved     PT LONG TERM GOAL #3   Title  Pt will report at least 50% in ability to hold head upright for improved posture.  (02-12-17)/ 04-02-17   Baseline pt reports approx. 25% improvement   Status On-going               Plan - 03/10/17 2147    Clinical Impression Statement Pt progressing slowly with being able to hold head upright for prolonged periods of time due to cervical musc. weakness and decr. muscle endurance.  Pt needs assistance to extend neck through last third of AROM for cervical extension.   Rehab Potential Good   PT Frequency 1x / week   PT Duration 4 weeks   PT Treatment/Interventions ADLs/Self Care Home Management;Cryotherapy;Moist Heat;Electrical Stimulation;Ultrasound;Therapeutic activities;Therapeutic exercise;Neuromuscular re-education;Patient/family education;Manual techniques;Passive range of motion   PT Next Visit Plan cont cervical strengthening   PT Home Exercise Plan see pt instructions; additional exs added on 01-20-17    Consulted and Agree with Plan of Care Patient      Patient will benefit from skilled therapeutic intervention in order to improve the following deficits and impairments:  Postural dysfunction, Decreased strength, Decreased range of motion, Impaired flexibility, Impaired vision/preception  Visit Diagnosis: Muscle weakness (generalized)  Abnormal posture     Problem List Patient Active Problem List   Diagnosis Date Noted  . Chest pain 01/08/2017  . Dyspnea 01/08/2017  . Memory difficulty 10/30/2016  . 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

## 2017-03-12 ENCOUNTER — Telehealth: Payer: Self-pay | Admitting: Interventional Cardiology

## 2017-03-12 NOTE — Telephone Encounter (Signed)
Spoke with daughter, DPR on file.  Pt finally able to start Spironolactone 12.5mg  QD.  Scheduled labs for 4/6.  Scheduled pt to see Dr. Tamala Julian on 5/14 as daughter wanted to push appt back since pt was just now starting this medication.  Advised daughter to call if any concerns.

## 2017-03-12 NOTE — Telephone Encounter (Signed)
New message     Pt daughter was calling back , her mother started the new medicine and they need to schedule lab appt. Call [place] not pt to set up appt

## 2017-03-19 ENCOUNTER — Other Ambulatory Visit: Payer: Medicare Other | Admitting: *Deleted

## 2017-03-19 DIAGNOSIS — R0600 Dyspnea, unspecified: Secondary | ICD-10-CM

## 2017-03-19 DIAGNOSIS — I200 Unstable angina: Secondary | ICD-10-CM

## 2017-03-20 ENCOUNTER — Other Ambulatory Visit: Payer: Medicare Other

## 2017-03-20 LAB — BASIC METABOLIC PANEL
BUN/Creatinine Ratio: 17 (ref 12–28)
BUN: 17 mg/dL (ref 8–27)
CALCIUM: 9.2 mg/dL (ref 8.7–10.3)
CO2: 24 mmol/L (ref 18–29)
CREATININE: 1 mg/dL (ref 0.57–1.00)
Chloride: 94 mmol/L — ABNORMAL LOW (ref 96–106)
GFR calc Af Amer: 61 mL/min/{1.73_m2} (ref >59)
GFR calc non Af Amer: 53 mL/min/{1.73_m2} — ABNORMAL LOW (ref >59)
Glucose: 87 mg/dL (ref 65–99)
POTASSIUM: 4.8 mmol/L (ref 3.5–5.2)
Sodium: 133 mmol/L — ABNORMAL LOW (ref 134–144)

## 2017-03-23 ENCOUNTER — Ambulatory Visit: Payer: Medicare Other | Attending: Neurology | Admitting: Physical Therapy

## 2017-03-23 DIAGNOSIS — M6281 Muscle weakness (generalized): Secondary | ICD-10-CM | POA: Diagnosis not present

## 2017-03-23 DIAGNOSIS — R29898 Other symptoms and signs involving the musculoskeletal system: Secondary | ICD-10-CM | POA: Insufficient documentation

## 2017-03-24 NOTE — Therapy (Signed)
[facility] [address] [place], [place], [address] Phone: [phone]   Fax:  [phone]  Physical Therapy Treatment  Patient Details  Name: Janet [name] MRN: [id number] Date of Birth: [birth year] Referring Provider: Dr. Floyde Parkins  Encounter Date: 03/23/2017      PT End of Session - 03/24/17 1258    Visit Number 11  G2   Number of Visits 12   Date for PT Re-Evaluation 04/02/17   Authorization Time Period 03-02-17 - 05-01-17   PT Start Time 1322   PT Stop Time 1406   PT Time Calculation (min) 44 min      Past Medical History:  Diagnosis Date  . Cancer [facility])    uterine cancer  . Depression   . 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 Dyslipidemia   . Lumbar radicular syndrome    left L5  . Macular degeneration    left eye    Past Surgical History:  Procedure Laterality Date  . ABDOMINAL HYSTERECTOMY    . TONSILLECTOMY      There were no vitals filed for this visit.      Subjective Assessment - 03/24/17 1253    Subjective Pt reports she feels she is able to hold her head up better but hasn't done exercises alot at home   Pertinent History Dropped head syndrome 11-16-14:  lumbar radicular syndrome; dyspnea   Patient Stated Goals "I want to hang my head less often"   Currently in Pain? No/denies                         [facility] Adult PT Treatment/Exercise - 03/24/17 0001      Neck Exercises: Theraband   Scapula Retraction 10 reps;Green  seated position - elbows bent at 90 degrees     Neck Exercises: Seated   Neck Retraction 10 reps   Other Seated Exercise Pt performed scapular retraction exercises with elbows bent at 90, elbows extended in horizontal abduciton, and each diagonal pattern 10 reps each     Neck Exercises: Prone   Axial Exentsion 20 reps   W Back 10 reps   Shoulder Extension 10 reps   Other Prone Exercise Pt performed cervical extension in quadruped position x 10 reps;  pt performed thoracic extension with arms down by side x 10 reps:  performed extension with 1 arm flexed, overhead with thoracic and cervical extension x 10 reps with each arm overhead      Seated - cervical extension - 20 reps   Pt performed cervical extension in prone - lifting one arm overhead with cervical extension               PT Long Term Goals - 03/24/17 1259      PT LONG TERM GOAL #1   Title Pt will demonstrate improved cervical extension strength by holding head upright for at least 20" nonstop during PT session.  (02-12-17)/ 04-02-17   Baseline met 03-23-17   Status Achieved     PT LONG TERM GOAL #2   Title Pt will be independent in HEP for cervical strengthening, stretching and ROM exercises. (02-12-17)/04-02-17   Baseline met 03-23-17   Status Achieved     PT LONG TERM GOAL #3   Title Pt will report at least 50% in ability to hold head upright for improved posture.  (02-12-17)/ 04-02-17   Baseline met 03-23-17   Status Achieved  Patient will benefit from skilled therapeutic intervention in order to improve the following deficits and impairments:     Visit Diagnosis: Other symptoms and signs involving the musculoskeletal system  Muscle weakness (generalized)       G-Codes - 17-Apr-2017 1305    Functional Assessment Tool Used (Outpatient Only) clinical judgment - pt presents with abnormal posture   Functional Limitation Changing and maintaining body position   Changing and Maintaining Body Position Goal Status (Z6109) At least 20 percent but less than 40 percent impaired, limited or restricted   Changing and Maintaining Body Position Discharge Status (U0454) At least 20 percent but less than 40 percent impaired, limited or restricted      Problem List Patient Active Problem List   Diagnosis Date Noted  . Chest pain 01/08/2017  . Dyspnea 01/08/2017  . Memory difficulty 10/30/2016  . 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 weakness with difficulty holding head upright for prolonged periods of time; Pt is able to extend head against gravity but unable to complete final approx. 30 degrees of AROM of cervical extension due to weakness  Education / Equipment: Pt has been instructed in a HEP for cervical strengthening, posture exercises, and scapula retraction strengthening Plan: Patient agrees to discharge.  Patient goals were met. Patient is being discharged due to meeting the stated rehab goals.  ?????         UJWJXB, JYNWG NFAOZHY, PT 03/24/2017, 1:08 PM  [facility] [address] [place], [place], [address] Phone: [phone]   Fax:  [phone]  Name: VALLERIE HENTZ MRN: [id number] Date of Birth: Jun 04, 1932

## 2017-03-30 ENCOUNTER — Ambulatory Visit: Payer: Medicare Other | Admitting: Interventional Cardiology

## 2017-04-09 DIAGNOSIS — M79645 Pain in left finger(s): Secondary | ICD-10-CM | POA: Diagnosis not present

## 2017-04-09 DIAGNOSIS — M2560 Stiffness of unspecified joint, not elsewhere classified: Secondary | ICD-10-CM | POA: Diagnosis not present

## 2017-04-14 DIAGNOSIS — M542 Cervicalgia: Secondary | ICD-10-CM | POA: Diagnosis not present

## 2017-04-14 DIAGNOSIS — M9901 Segmental and somatic dysfunction of cervical region: Secondary | ICD-10-CM | POA: Diagnosis not present

## 2017-04-14 DIAGNOSIS — M50323 Other cervical disc degeneration at C6-C7 level: Secondary | ICD-10-CM | POA: Diagnosis not present

## 2017-04-14 DIAGNOSIS — M546 Pain in thoracic spine: Secondary | ICD-10-CM | POA: Diagnosis not present

## 2017-04-14 DIAGNOSIS — M9902 Segmental and somatic dysfunction of thoracic region: Secondary | ICD-10-CM | POA: Diagnosis not present

## 2017-04-23 ENCOUNTER — Encounter: Payer: Self-pay | Admitting: Neurology

## 2017-04-26 NOTE — Progress Notes (Signed)
Cardiology Office Note    Date:  04/27/2017   ID:  Janet [name], Janet [name] [birth year], MRN [id number]  PCP:  Lajean Manes, MD  Cardiologist: Sinclair Grooms, MD   Chief Complaint  Patient presents with  . Shortness of Breath    History of Present Illness:  Janet [name] is a 83 y.o. female  who presents for f/u of dyspnea evaluation after starting aldactone..  The patient has dropped head syndrome. She has complained of dyspnea both at rest and with exertion. I've seen her ambulate and she does not seem to have very much difficulty with ambulation. She does have the Dropped He had Syndrome. She frequently awakens from sleep with dyspnea due to upper airway obstruction/irritation. She denies acid reflux, cough, and wheezing.  After accumulating data on the last visit that included a BNP of 110 and normal echo other than diastolic dysfunction, a trial of low-dose Aldactone 12.5 mg daily was started but did not bring about any improvement.   Past Medical History:  Diagnosis Date  . Cancer [facility])    uterine cancer  . Depression   . 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 Dyslipidemia   . Lumbar radicular syndrome    left L5  . Macular degeneration    left eye    Past Surgical History:  Procedure Laterality Date  . ABDOMINAL HYSTERECTOMY    . TONSILLECTOMY      Current Medications: Outpatient Medications Prior to Visit  Medication Sig Dispense Refill  . Calcium Carb-Cholecalciferol (CALCIUM/VITAMIN D PO) Take 1 tablet by mouth daily.    . memantine (NAMENDA) 10 MG tablet Take 1 tablet (10 mg total) by mouth 2 (two) times daily. 180 tablet 1  . mirtazapine (REMERON) 30 MG tablet Take 30 mg by mouth at bedtime.     . Multiple Vitamins-Minerals (PRESERVISION AREDS PO) Take 1 tablet by mouth 2 (two) times daily.     . Omega-3 Fatty Acids (FISH OIL PO) Take 1 capsule by mouth 2 (two) times daily.     . sertraline (ZOLOFT) 25 MG tablet Take 37.5 mg by mouth daily.     Marland Kitchen spironolactone  (ALDACTONE) 25 MG tablet Take 0.5 tablets (12.5 mg total) by mouth daily. 45 tablet 3  . vitamin B-12 (CYANOCOBALAMIN) 1000 MCG tablet Take 1,000 mcg by mouth daily.     No facility-administered medications prior to visit.      Allergies:   Aricept [donepezil hcl]; Latex; and Tramadol   Social History   Social History  . Marital status: Married    Spouse name: N/A  . Number of children: N/A  . Years of education: N/A   Occupational History  . N/A    Social History Main Topics  . Smoking status: Never Smoker  . Smokeless tobacco: Never Used  . Alcohol use No  . Drug use: Unknown  . Sexual activity: Not Asked   Other Topics Concern  . None   Social History Narrative   Lives at home w/ her husband   Left-handed   Caffeine: tea once a day     Family History:  The patient's family history includes Dementia in her sister; Diabetes in her father; Heart attack in her father; Pneumonia in her brother.   ROS:   Please see the history of present illness.    Nasal congestion and postnasal drip. Constipation. Depression and decreased memory. Difficulty with balance. Accompanied by her daughter.  All other systems reviewed and are negative.  PHYSICAL EXAM:   VS:  BP 126/64 (BP Location: Left Arm)   Pulse 81   Ht 5\' 3"  (1.6 m)   Wt 133 lb 12.8 oz (60.7 kg)   BMI 23.70 kg/m    GEN: Well nourished, well developed, in no acute distress  HEENT: normal  Neck: no JVD, carotid bruits, or masses Cardiac: RRR; no murmurs, rubs, or gallops,no edema  Respiratory:  clear to auscultation bilaterally, normal work of breathing GI: soft, nontender, nondistended, + BS MS: no deformity or atrophy  Skin: warm and dry, no rash Neuro:  Alert and Oriented x 3, Strength and sensation are intact Psych: euthymic mood, full affect  Wt Readings from Last 3 Encounters:  04/27/17 133 lb 12.8 oz (60.7 kg)  01/09/17 134 lb 12.5 oz (61.1 kg)  01/06/17 135 lb 8 oz (61.5 kg)      Studies/Labs  Reviewed:   EKG:  EKG  Not repeated. Tracing performed. Normal sinus rhythm with normal appearance.  Recent Labs: 01/09/2017: NT-Pro BNP 109 03/19/2017: BUN 17; Creatinine, Ser 1.00; Potassium 4.8; Sodium 133   Lipid Panel No results found for: CHOL, TRIG, HDL, CHOLHDL, VLDL, LDLCALC, LDLDIRECT  Additional studies/ records that were reviewed today include:   ECHOCARDIOGRAM 01/26/17: Study Conclusions  - Left ventricle: Wall thickness was increased in a pattern of mild   LVH. Systolic function was vigorous. The estimated ejection   fraction was in the range of 65% to 70%. Doppler parameters are   consistent with abnormal left ventricular relaxation (grade 1   diastolic dysfunction). - Mitral valve: There was mild regurgitation.   ASSESSMENT:    1. Dyspnea, unspecified type   2. Dropped head syndrome      PLAN:  In order of problems listed above:  1. Normal BNP, normal LV systolic function with mild diastolic dysfunction, and no response to low-dose diuretic therapy in the form of Aldactone. There is no cardiac basis or explanation for the patient's complaint. No further cardiac evaluation. Consider pulmonary evaluation in the future if dyspnea persists and has significant negative impact on quality of life. No further cardiac evaluation is felt necessary. Symptoms do not sound ischemic in nature.  Overall plan is conservative management with clinical observation. No further cardiac testing. I will be happy to see her in the future if worsening complaints.  Medication Adjustments/Labs and Tests Ordered: Current medicines are reviewed at length with the patient today.  Concerns regarding medicines are outlined above.  Medication changes, Labs and Tests ordered today are listed in the Patient Instructions below. Patient Instructions  Medication Instructions:  Your physician has recommended you make the following change in your medication:  STOP Spironolactone   Labwork: None  ordered  Testing/Procedures: None ordered  Follow-Up: Your physician recommends that you schedule a follow-up appointment as needed   Any Other Special Instructions Will Be Listed Below (If Applicable). Ok to resume your normal physical activity   If you need a refill on your cardiac medications before your next appointment, please call your pharmacy.      Signed, Sinclair Grooms, MD  04/27/2017 4:59 PM    [facility] [place], [place], [place]  [address] Phone: [phone]; Fax: [phone]

## 2017-04-27 ENCOUNTER — Encounter: Payer: Self-pay | Admitting: Interventional Cardiology

## 2017-04-27 ENCOUNTER — Telehealth: Payer: Self-pay

## 2017-04-27 ENCOUNTER — Ambulatory Visit (INDEPENDENT_AMBULATORY_CARE_PROVIDER_SITE_OTHER): Payer: Medicare Other | Admitting: Interventional Cardiology

## 2017-04-27 VITALS — BP 126/64 | HR 81 | Ht 63.0 in | Wt 133.8 lb

## 2017-04-27 DIAGNOSIS — F324 Major depressive disorder, single episode, in partial remission: Secondary | ICD-10-CM | POA: Diagnosis not present

## 2017-04-27 DIAGNOSIS — R0600 Dyspnea, unspecified: Secondary | ICD-10-CM

## 2017-04-27 DIAGNOSIS — I200 Unstable angina: Secondary | ICD-10-CM

## 2017-04-27 DIAGNOSIS — R29898 Other symptoms and signs involving the musculoskeletal system: Secondary | ICD-10-CM

## 2017-04-27 NOTE — Patient Instructions (Signed)
Medication Instructions:  Your physician has recommended you make the following change in your medication:  STOP Spironolactone   Labwork: None ordered  Testing/Procedures: None ordered  Follow-Up: Your physician recommends that you schedule a follow-up appointment as needed   Any Other Special Instructions Will Be Listed Below (If Applicable). Ok to resume your normal physical activity   If you need a refill on your cardiac medications before your next appointment, please call your pharmacy.

## 2017-04-27 NOTE — Telephone Encounter (Signed)
Left vm for Janet [name] patients daughter that referral will be sent to driver rehab on 1/61/0960. Rn left vm to give rehab to process the referral to make an appt. Rn left my chart message that this service is not cover by insurance, its self pay. Referral sent and receive.Rn left phone number for driver rehab on [employer].

## 2017-04-28 NOTE — Telephone Encounter (Signed)
Rn call Janet [name] about the appt on Friday and driver rehab referral. Janet [name] states her mom has an appt on Friday with Janet. [name]. Janet [name] wants Janet .[name] to bring up the driving issues/driver rehab to patient. Janet [name] stated if they tell their mother about driver rehab she will get real resistant. During the visit they want Janet [name] to discuss it. Janet [name] stated her mother stop taking aldactone per her cardiologist.Janet [name] stated she did some research and saw that the aldactone could cause confusion. She states pts memory is not getting better.Janet [name] does not want driver rehab to call patient. Rn stated the patients number is listed as first contact. Janet [name] stated to keep the numbers the say because she lives in [place]. Janet [name] has her number listed as the mobile number.Janet [name] will call driver rehab to put the referral on hold. She does not want them calling her mom.

## 2017-04-28 NOTE — Telephone Encounter (Signed)
DR. Joaquin Courts. Beth wants you to discuss this on Friday. She does not need a call back. Beth will be at appt.

## 2017-04-28 NOTE — Telephone Encounter (Signed)
Pt daughter is asking for a call back re: the driving evaluation for her mother, please call.

## 2017-04-29 DIAGNOSIS — M79645 Pain in left finger(s): Secondary | ICD-10-CM | POA: Diagnosis not present

## 2017-04-29 DIAGNOSIS — M2560 Stiffness of unspecified joint, not elsewhere classified: Secondary | ICD-10-CM | POA: Diagnosis not present

## 2017-05-01 ENCOUNTER — Encounter: Payer: Self-pay | Admitting: Neurology

## 2017-05-01 ENCOUNTER — Ambulatory Visit (INDEPENDENT_AMBULATORY_CARE_PROVIDER_SITE_OTHER): Payer: Medicare Other | Admitting: Neurology

## 2017-05-01 VITALS — BP 126/65 | HR 68 | Wt 133.5 lb

## 2017-05-01 DIAGNOSIS — I200 Unstable angina: Secondary | ICD-10-CM

## 2017-05-01 DIAGNOSIS — R29898 Other symptoms and signs involving the musculoskeletal system: Secondary | ICD-10-CM | POA: Diagnosis not present

## 2017-05-01 DIAGNOSIS — R202 Paresthesia of skin: Secondary | ICD-10-CM | POA: Diagnosis not present

## 2017-05-01 DIAGNOSIS — R413 Other amnesia: Secondary | ICD-10-CM

## 2017-05-01 MED ORDER — PHOSPHATIDYLSERINE-DHA-EPA 100-19.5-6.5 MG PO CAPS: 1.0000 | ORAL_CAPSULE | Freq: Every day | ORAL | 1 refills | Status: DC

## 2017-05-01 NOTE — Progress Notes (Signed)
Reason for visit: Memory disturbance  Janet [name] is an 83 y.o. female  History of present illness:  Janet [name] is an 83 year old left-handed white female with a history of a dropped head syndrome, and a mild memory disturbance. The patient believes that her memory is gradually getting worse over time. She is on Namenda, she was not able to tolerate Aricept secondary to severe muscle cramps. The patient has been operating a motor vehicle, she occasionally will have troubles with directions but this is not a significant issue for her. There have been no safety issues with driving. The patient over the last 1-2 months has noted some tingling sensation in the left hand. This is not keeping her awake at night. She has been placed on spironolactone, but was recently taken off the medication because it did not help her slight shortness of breath. The patient has been getting physical therapy for the dropped head syndrome with marginal benefit. She uses a cane for ambulation, she has not had any falls. She denies neck pain or pain down the left arm. She comes back to this office for an evaluation.  Past Medical History:  Diagnosis Date  . Cancer [facility])    uterine cancer  . Depression   . 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 Dyslipidemia   . Lumbar radicular syndrome    left L5  . Macular degeneration    left eye    Past Surgical History:  Procedure Laterality Date  . ABDOMINAL HYSTERECTOMY    . TONSILLECTOMY      Family History  Problem Relation Age of Onset  . Diabetes Father   . Heart attack Father   . Dementia Sister   . Pneumonia Brother     Social history:  reports that she has never smoked. She has never used smokeless tobacco. She reports that she does not drink alcohol or use drugs.    Allergies  Allergen Reactions  . Aricept [Donepezil Hcl]     Muscle cramps  . Latex Itching  . Tramadol Other (See Comments)    Pt can't remember what side effects she had, she doesn't  take it now    Medications:  Prior to Admission medications   Medication Sig Start Date End Date Taking? Authorizing Provider  Calcium Carb-Cholecalciferol (CALCIUM/VITAMIN D PO) Take 1 tablet by mouth daily.   Yes [provider]  memantine (NAMENDA) 10 MG tablet Take 1 tablet (10 mg total) by mouth 2 (two) times daily. 01/30/17  Yes Kathrynn Ducking, MD  mirtazapine (REMERON) 30 MG tablet Take 30 mg by mouth at bedtime.  10/10/16  Yes [provider]  Multiple Vitamins-Minerals (PRESERVISION AREDS PO) Take 1 tablet by mouth 2 (two) times daily.    Yes [provider]  Omega-3 Fatty Acids (FISH OIL PO) Take 1 capsule by mouth 2 (two) times daily.    Yes [provider]  sertraline (ZOLOFT) 25 MG tablet Take 37.5 mg by mouth daily.  10/10/16  Yes [provider]  vitamin B-12 (CYANOCOBALAMIN) 1000 MCG tablet Take 1,000 mcg by mouth daily.   Yes [provider]    ROS:  Out of a complete 14 system review of symptoms, the patient complains only of the following symptoms, and all other reviewed systems are negative.  Shortness of breath Leg swelling Urinary urgency Neck stiffness Memory loss, numbness Confusion, depression  Blood pressure 126/65, pulse 68, weight 133 lb 8 oz (60.6 kg).  Physical Exam  General:  The patient is alert and cooperative at the time of the examination.  Skin: No significant peripheral edema is noted.   Neurologic Exam  Mental status: The patient is alert and oriented x 3 at the time of the examination. The Mini-Mental Status Examination done today shows a total score of 28/30. The patient is able to name 10 animals in 1 minute.   Cranial nerves: Facial symmetry is present. Speech is normal, no aphasia or dysarthria is noted. Extraocular movements are full. Visual fields are full. The patient does have some flexion of the neck when standing.  Motor: The patient has good strength in all 4  extremities.  Sensory examination: Soft touch sensation is symmetric on the face, arms, and legs.  Coordination: The patient has good finger-nose-finger and heel-to-shin bilaterally. Tinel's sign of the wrist is positive on the left greater than right.  Gait and station: The patient has a slightly wide-based gait, the patient usually uses a cane for ambulation. Tandem gait is unsteady. Romberg is negative. No drift is seen.  Reflexes: Deep tendon reflexes are symmetric.   Assessment/Plan:  1. Mild memory disturbance  2. Left hand numbness,   Probable carpal tunnel syndrome  3. Dropped head syndrome  The patient will be given Vayacog to add to the [place] for the memory issue. She will be set up for nerve conduction studies on both arms, EMG on the left arm to evaluate for carpal tunnel syndrome. The patient will follow-up in 6 months. The driving issue will need to be scrutinized closely.   Jill Alexanders MD 05/01/2017 11:57 AM  [facility] [address] [place], [place] [address]  Phone [phone] Fax [phone]

## 2017-05-22 DIAGNOSIS — M20012 Mallet finger of left finger(s): Secondary | ICD-10-CM | POA: Diagnosis not present

## 2017-06-02 ENCOUNTER — Ambulatory Visit (INDEPENDENT_AMBULATORY_CARE_PROVIDER_SITE_OTHER): Payer: Self-pay | Admitting: Neurology

## 2017-06-02 ENCOUNTER — Encounter: Payer: Self-pay | Admitting: Neurology

## 2017-06-02 ENCOUNTER — Encounter: Payer: Medicare Other | Admitting: Neurology

## 2017-06-02 ENCOUNTER — Ambulatory Visit (INDEPENDENT_AMBULATORY_CARE_PROVIDER_SITE_OTHER): Payer: Medicare Other | Admitting: Neurology

## 2017-06-02 DIAGNOSIS — G5602 Carpal tunnel syndrome, left upper limb: Secondary | ICD-10-CM

## 2017-06-02 DIAGNOSIS — R202 Paresthesia of skin: Secondary | ICD-10-CM | POA: Diagnosis not present

## 2017-06-02 HISTORY — DX: Carpal tunnel syndrome, left upper limb: G56.02

## 2017-06-02 NOTE — Progress Notes (Signed)
Please refer to EMG and nerve conduction study procedure note. 

## 2017-06-02 NOTE — Progress Notes (Signed)
The patient comes in for EMG and nerve conduction study today. The study shows evidence of a mild left carpal tunnel syndrome. The patient will be given a persistent for respond, she is to contact me in 2 months if her symptoms are not improving. We will consider a referral to Dr. Fredna Dow at that time.

## 2017-06-02 NOTE — Procedures (Signed)
     HISTORY:  Janet [name] is an 83 year old patient with a history of numbness involving the left hand that has been present for several months. The patient is being evaluated for a possible neuropathy or a cervical radiculopathy.  NERVE CONDUCTION STUDIES:  Nerve conduction studies were performed on both upper extremities. The distal motor latency for the left median nerve was prolonged, normal on the right. The motor amplitudes for these nerves were slightly low on both sides. The distal motor latencies and motor amplitudes for the ulnar nerves were normal bilaterally. The nerve conduction velocities for the median nerves were normal bilaterally, and normal for the right ulnar nerve. There is slight slowing above the elbow for the left ulnar nerve, normal below the elbow. The sensory latencies for the median nerves were prolonged on the left and normal on the right and normal for the ulnar nerves bilaterally. The F wave latencies for the ulnar nerves were normal bilaterally.  EMG STUDIES:  EMG study was performed on the left upper extremity:  The first dorsal interosseous muscle reveals 2 to 4 K units with full recruitment. No fibrillations or positive waves were noted. The abductor pollicis brevis muscle reveals 2 to 4 K units with full recruitment. No fibrillations or positive waves were noted. The extensor indicis proprius muscle reveals 1 to 3 K units with full recruitment. No fibrillations or positive waves were noted. The pronator teres muscle reveals 2 to 3 K units with full recruitment. No fibrillations or positive waves were noted. The biceps muscle reveals 1 to 2 K units with full recruitment. No fibrillations or positive waves were noted. The triceps muscle reveals 2 to 4 K units with full recruitment. No fibrillations or positive waves were noted. The anterior deltoid muscle reveals 2 to 3 K units with full recruitment. No fibrillations or positive waves were noted. The cervical  paraspinal muscles were tested at 2 levels. No abnormalities of insertional activity were seen at either level tested. There was good relaxation.   IMPRESSION:  Nerve conduction studies done on both upper extremities shows evidence of a mild left carpal tunnel syndrome. EMG evaluation of the left upper extremity is unremarkable without evidence of an overlying cervical radiculopathy.   Jill Alexanders MD 06/02/2017 1:59 PM  [facility] [address] [place], [place] [address]  Phone [phone] Fax [phone]

## 2017-06-04 DIAGNOSIS — M546 Pain in thoracic spine: Secondary | ICD-10-CM | POA: Diagnosis not present

## 2017-06-04 DIAGNOSIS — M9902 Segmental and somatic dysfunction of thoracic region: Secondary | ICD-10-CM | POA: Diagnosis not present

## 2017-06-04 DIAGNOSIS — M50323 Other cervical disc degeneration at C6-C7 level: Secondary | ICD-10-CM | POA: Diagnosis not present

## 2017-06-04 DIAGNOSIS — M542 Cervicalgia: Secondary | ICD-10-CM | POA: Diagnosis not present

## 2017-06-04 DIAGNOSIS — M9901 Segmental and somatic dysfunction of cervical region: Secondary | ICD-10-CM | POA: Diagnosis not present

## 2017-06-05 DIAGNOSIS — M2560 Stiffness of unspecified joint, not elsewhere classified: Secondary | ICD-10-CM | POA: Diagnosis not present

## 2017-06-05 DIAGNOSIS — M79645 Pain in left finger(s): Secondary | ICD-10-CM | POA: Diagnosis not present

## 2017-06-05 DIAGNOSIS — M20012 Mallet finger of left finger(s): Secondary | ICD-10-CM | POA: Diagnosis not present

## 2017-06-08 ENCOUNTER — Ambulatory Visit (INDEPENDENT_AMBULATORY_CARE_PROVIDER_SITE_OTHER): Payer: Medicare Other | Admitting: Licensed Clinical Social Worker

## 2017-06-08 DIAGNOSIS — F330 Major depressive disorder, recurrent, mild: Secondary | ICD-10-CM

## 2017-06-29 ENCOUNTER — Ambulatory Visit (INDEPENDENT_AMBULATORY_CARE_PROVIDER_SITE_OTHER): Payer: Medicare Other | Admitting: Licensed Clinical Social Worker

## 2017-06-29 DIAGNOSIS — F330 Major depressive disorder, recurrent, mild: Secondary | ICD-10-CM

## 2017-07-13 DIAGNOSIS — M2560 Stiffness of unspecified joint, not elsewhere classified: Secondary | ICD-10-CM | POA: Diagnosis not present

## 2017-07-13 DIAGNOSIS — M20012 Mallet finger of left finger(s): Secondary | ICD-10-CM | POA: Diagnosis not present

## 2017-07-16 ENCOUNTER — Ambulatory Visit (INDEPENDENT_AMBULATORY_CARE_PROVIDER_SITE_OTHER): Payer: Medicare Other | Admitting: Licensed Clinical Social Worker

## 2017-07-16 DIAGNOSIS — F331 Major depressive disorder, recurrent, moderate: Secondary | ICD-10-CM | POA: Diagnosis not present

## 2017-07-20 ENCOUNTER — Telehealth: Payer: Self-pay | Admitting: Neurology

## 2017-07-20 NOTE — Telephone Encounter (Signed)
Called and spoke with pt. Gave her dx of memory difficulty and dropped head syndrome. Pt verbalized understanding. Nothing further needed at this time.

## 2017-07-20 NOTE — Telephone Encounter (Signed)
Pt called the office said she is getting a driver eval at [facility] today 12 and wanting to know her dx for the paperwork. If at all possible pls call prior to 12. Thank you

## 2017-07-23 ENCOUNTER — Other Ambulatory Visit: Payer: Self-pay | Admitting: Neurology

## 2017-08-13 ENCOUNTER — Ambulatory Visit (INDEPENDENT_AMBULATORY_CARE_PROVIDER_SITE_OTHER): Payer: Medicare Other | Admitting: Licensed Clinical Social Worker

## 2017-08-13 DIAGNOSIS — F3341 Major depressive disorder, recurrent, in partial remission: Secondary | ICD-10-CM

## 2017-08-20 DIAGNOSIS — M9901 Segmental and somatic dysfunction of cervical region: Secondary | ICD-10-CM | POA: Diagnosis not present

## 2017-08-20 DIAGNOSIS — M9902 Segmental and somatic dysfunction of thoracic region: Secondary | ICD-10-CM | POA: Diagnosis not present

## 2017-08-20 DIAGNOSIS — M9904 Segmental and somatic dysfunction of sacral region: Secondary | ICD-10-CM | POA: Diagnosis not present

## 2017-08-20 DIAGNOSIS — M50323 Other cervical disc degeneration at C6-C7 level: Secondary | ICD-10-CM | POA: Diagnosis not present

## 2017-08-20 DIAGNOSIS — M542 Cervicalgia: Secondary | ICD-10-CM | POA: Diagnosis not present

## 2017-08-20 DIAGNOSIS — M546 Pain in thoracic spine: Secondary | ICD-10-CM | POA: Diagnosis not present

## 2017-10-05 ENCOUNTER — Ambulatory Visit (INDEPENDENT_AMBULATORY_CARE_PROVIDER_SITE_OTHER): Payer: Medicare Other | Admitting: Licensed Clinical Social Worker

## 2017-10-05 DIAGNOSIS — F3341 Major depressive disorder, recurrent, in partial remission: Secondary | ICD-10-CM

## 2017-10-09 DIAGNOSIS — F4321 Adjustment disorder with depressed mood: Secondary | ICD-10-CM | POA: Diagnosis not present

## 2017-10-22 DIAGNOSIS — M9902 Segmental and somatic dysfunction of thoracic region: Secondary | ICD-10-CM | POA: Diagnosis not present

## 2017-10-22 DIAGNOSIS — M542 Cervicalgia: Secondary | ICD-10-CM | POA: Diagnosis not present

## 2017-10-22 DIAGNOSIS — M546 Pain in thoracic spine: Secondary | ICD-10-CM | POA: Diagnosis not present

## 2017-10-22 DIAGNOSIS — M50323 Other cervical disc degeneration at C6-C7 level: Secondary | ICD-10-CM | POA: Diagnosis not present

## 2017-10-22 DIAGNOSIS — M9901 Segmental and somatic dysfunction of cervical region: Secondary | ICD-10-CM | POA: Diagnosis not present

## 2017-10-27 ENCOUNTER — Ambulatory Visit: Payer: Medicare Other | Admitting: Licensed Clinical Social Worker

## 2017-10-28 ENCOUNTER — Other Ambulatory Visit: Payer: Self-pay | Admitting: Neurology

## 2017-10-29 ENCOUNTER — Encounter: Payer: Self-pay | Admitting: Internal Medicine

## 2017-10-29 DIAGNOSIS — H26491 Other secondary cataract, right eye: Secondary | ICD-10-CM | POA: Diagnosis not present

## 2017-10-29 DIAGNOSIS — H4322 Crystalline deposits in vitreous body, left eye: Secondary | ICD-10-CM | POA: Diagnosis not present

## 2017-10-29 DIAGNOSIS — H52203 Unspecified astigmatism, bilateral: Secondary | ICD-10-CM | POA: Diagnosis not present

## 2017-10-29 DIAGNOSIS — H353131 Nonexudative age-related macular degeneration, bilateral, early dry stage: Secondary | ICD-10-CM | POA: Diagnosis not present

## 2017-11-12 ENCOUNTER — Ambulatory Visit (INDEPENDENT_AMBULATORY_CARE_PROVIDER_SITE_OTHER): Payer: Medicare Other | Admitting: Licensed Clinical Social Worker

## 2017-11-12 DIAGNOSIS — F3341 Major depressive disorder, recurrent, in partial remission: Secondary | ICD-10-CM | POA: Diagnosis not present

## 2017-11-13 ENCOUNTER — Encounter: Payer: Self-pay | Admitting: Neurology

## 2017-11-13 ENCOUNTER — Ambulatory Visit (INDEPENDENT_AMBULATORY_CARE_PROVIDER_SITE_OTHER): Payer: Medicare Other | Admitting: Neurology

## 2017-11-13 VITALS — BP 125/71 | HR 73 | Ht 63.0 in | Wt 127.5 lb

## 2017-11-13 DIAGNOSIS — I200 Unstable angina: Secondary | ICD-10-CM

## 2017-11-13 DIAGNOSIS — R29898 Other symptoms and signs involving the musculoskeletal system: Secondary | ICD-10-CM | POA: Diagnosis not present

## 2017-11-13 DIAGNOSIS — R413 Other amnesia: Secondary | ICD-10-CM

## 2017-11-13 NOTE — Progress Notes (Signed)
Reason for visit: Memory disturbance, dropped head syndrome  Janet [name] is an 83 y.o. female  History of present illness:  Janet [name] is an 83 year old left-handed white female with a history of a mild memory disturbance.  The patient is on Namenda, she could not tolerate Aricept previously.  The patient is on Vayacog.  The patient has had some difficulty with sleeping at night, her husband has had difficulty with sleeping and keeps her awake.  The patient is the caretaker, she does not allow for adequate sleep, she does not exercise during the day.  The patient still has some troubles with the dropped head syndrome, this has not changed much over time.  The patient has undergone an occupational therapy evaluation for her driving, it was recommended that she stop driving at the end of December 2018.  The patient has left carpal tunnel syndrome, she believes that this has improved with using the wrist splint, she has stopped using the wrist splint.  The patient may have a soft cervical collar at times for the dropped head syndrome.  Her chiropractor recommended a modified [place] collar, but the patient herself does not wish to use this.  The patient returns to the office today for an evaluation.   Past Medical History:  Diagnosis Date  . Cancer [facility])    uterine cancer  . Carpal tunnel syndrome on left 06/02/2017  . Depression   . 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 Dyslipidemia   . Lumbar radicular syndrome    left L5  . Macular degeneration    left eye    Past Surgical History:  Procedure Laterality Date  . ABDOMINAL HYSTERECTOMY    . TONSILLECTOMY      Family History  Problem Relation Age of Onset  . Diabetes Father   . Heart attack Father   . Dementia Sister   . Pneumonia Brother     Social history:  reports that  has never smoked. she has never used smokeless tobacco. She reports that she does not drink alcohol or use drugs.    Allergies  Allergen Reactions  .  Aricept [Donepezil Hcl]     Muscle cramps  . Latex Itching  . Tramadol Other (See Comments)    Pt can't remember what side effects she had, she doesn't take it now    Medications:  Prior to Admission medications   Medication Sig Start Date End Date Taking? Authorizing Provider  ASPIRIN 81 PO Take 1 tablet by mouth daily.   Yes [provider]  CALCIUM-VITAMIN D PO Take 1 tablet by mouth daily. 600mg  calcium, 800 units Vit D   Yes [provider]  memantine (NAMENDA) 10 MG tablet TAKE 1 TABLET TWICE DAILY. 07/24/17  Yes Kathrynn Ducking, MD  mirtazapine (REMERON) 30 MG tablet Take 30 mg by mouth at bedtime.  10/10/16  Yes [provider]  Multiple Vitamins-Minerals (CENTRUM SILVER PO) Take 1 tablet by mouth daily.   Yes [provider]  Multiple Vitamins-Minerals (PRESERVISION AREDS PO) Take 2 capsules by mouth daily.    Yes [provider]  sertraline (ZOLOFT) 25 MG tablet Take 50 mg by mouth daily.  10/10/16  Yes [provider]  VAYACOG 100-19.5-6.5 MG CAPS TAKE (1) CAPSULE DAILY. 10/28/17  Yes Kathrynn Ducking, MD  vitamin B-12 (CYANOCOBALAMIN) 1000 MCG tablet Take 1,000 mcg by mouth daily.   Yes [provider]    ROS:  Out of a complete 14 system review  of symptoms, the patient complains only of the following symptoms, and all other reviewed systems are negative.  Constipation Bruising easily Memory loss Depression  Blood pressure 125/71, pulse 73, height 5\' 3"  (1.6 m), weight 127 lb 8 oz (57.8 kg).  Physical Exam  General: The patient is alert and cooperative at the time of the examination.  Neuromuscular: The patient does have some slight flexion of the neck, she is able to extend the neck fully.  Skin: No significant peripheral edema is noted.   Neurologic Exam  Mental status: The patient is alert and oriented x 3 at the time of the examination. The patient has apparent normal recent and remote memory,  with an apparently normal attention span and concentration ability.  Mini-Mental status examination done today shows a total score 26/30.   Cranial nerves: Facial symmetry is present. Speech is normal, no aphasia or dysarthria is noted. Extraocular movements are full. Visual fields are full.  Motor: The patient has good strength in all 4 extremities.  Sensory examination: Soft touch sensation is symmetric on the face, arms, and legs.  Coordination: The patient has good finger-nose-finger and heel-to-shin bilaterally.  Gait and station: The patient has a normal gait. Tandem gait is unsteady. Romberg is negative, but is unsteady. No drift is seen.  The patient normally uses a cane for ambulation.  Reflexes: Deep tendon reflexes are symmetric.   Assessment/Plan:  1.  Dropped head syndrome  2.  Mild memory disturbance  The patient has been clinically relatively stable over time.  The patient will continue the [place].  A prescription was given for a modified [place] collar, the patient may opt not to use this.  The patient will follow-up in 6 months.  The patient is to try to exercise on a regular basis, she needs to get adequate rest to improve her overall energy level, this may also improve her depression.   Jill Alexanders MD 11/13/2017 11:07 AM  [facility] [address] [place], [place] [address]  Phone [phone] Fax [phone]

## 2017-11-26 ENCOUNTER — Ambulatory Visit (INDEPENDENT_AMBULATORY_CARE_PROVIDER_SITE_OTHER): Payer: Medicare Other | Admitting: Licensed Clinical Social Worker

## 2017-11-26 DIAGNOSIS — F3341 Major depressive disorder, recurrent, in partial remission: Secondary | ICD-10-CM | POA: Diagnosis not present

## 2017-12-18 DIAGNOSIS — F4321 Adjustment disorder with depressed mood: Secondary | ICD-10-CM | POA: Diagnosis not present

## 2017-12-29 ENCOUNTER — Ambulatory Visit (INDEPENDENT_AMBULATORY_CARE_PROVIDER_SITE_OTHER): Payer: Medicare Other | Admitting: Licensed Clinical Social Worker

## 2017-12-29 DIAGNOSIS — F3341 Major depressive disorder, recurrent, in partial remission: Secondary | ICD-10-CM

## 2018-01-04 DIAGNOSIS — L821 Other seborrheic keratosis: Secondary | ICD-10-CM | POA: Diagnosis not present

## 2018-01-04 DIAGNOSIS — L308 Other specified dermatitis: Secondary | ICD-10-CM | POA: Diagnosis not present

## 2018-01-26 ENCOUNTER — Other Ambulatory Visit: Payer: Self-pay | Admitting: Neurology

## 2018-01-28 ENCOUNTER — Telehealth: Payer: Self-pay

## 2018-01-28 ENCOUNTER — Encounter: Payer: Self-pay | Admitting: *Deleted

## 2018-01-28 ENCOUNTER — Other Ambulatory Visit: Payer: Self-pay | Admitting: Neurology

## 2018-01-28 NOTE — Telephone Encounter (Signed)
Patients daughter called the office wanting her to be seen in clinic with Dr. Bubba Camp. Patient hasn't established care with [facility] or Dr. Bubba Camp. Her daughter was advised to take her mother to urgent care or PCP. Patients daughter was given a new patient appointment with Dr. Bubba Camp 02-22-18 at 2 pm.

## 2018-01-28 NOTE — Progress Notes (Signed)
I spoke to patient`s daughter who had called answering service concerned that her mom had taken a extra dose of her cough medicine which contained 4 mg chlorphemiramine and dextromethorphan. She seemed to be fine at present ibuprofen reviewed her medication list and advised to hold her remeron dose tonight and check on her in the morning.She voiced understanding with the plan.

## 2018-02-01 ENCOUNTER — Encounter: Payer: Self-pay | Admitting: Internal Medicine

## 2018-02-01 ENCOUNTER — Non-Acute Institutional Stay: Payer: Medicare Other | Admitting: Internal Medicine

## 2018-02-01 VITALS — BP 122/60 | HR 84 | Temp 97.9°F | Resp 16 | Ht 63.5 in | Wt 131.6 lb

## 2018-02-01 DIAGNOSIS — L309 Dermatitis, unspecified: Secondary | ICD-10-CM

## 2018-02-01 DIAGNOSIS — R0981 Nasal congestion: Secondary | ICD-10-CM

## 2018-02-01 DIAGNOSIS — N183 Chronic kidney disease, stage 3 unspecified: Secondary | ICD-10-CM

## 2018-02-01 DIAGNOSIS — F341 Dysthymic disorder: Secondary | ICD-10-CM

## 2018-02-01 DIAGNOSIS — F329 Major depressive disorder, single episode, unspecified: Secondary | ICD-10-CM

## 2018-02-01 DIAGNOSIS — R413 Other amnesia: Secondary | ICD-10-CM

## 2018-02-01 NOTE — Progress Notes (Signed)
[facility]  Provider: Blanchie Serve MD   Location:  [place] of Service:  Clinic (12)  PCP: Lajean Manes, MD Patient Care Team: Lajean Manes, MD as PCP - General (Internal Medicine) [name], MD as Referring Physician (Physical Medicine and Rehabilitation)  Extended Emergency Contact Information Primary Emergency Contact: [name], [place] Phone: [phone] Relation: Daughter Secondary Emergency Contact: Myers,Lois  [place] Phone: [phone] Relation: Daughter  Goals of Care: Advanced Directive information Advanced Directives 01/13/2017  Does Patient Have a Medical Advance Directive? Yes  Type of Advance Directive Living will     Chief Complaint  Patient presents with  . New Patient (Initial Visit)    establish care  . Medication Refill    No refills needed at this time.     HPI: [name] seen today to establish care. She is here with her daughter. She had a head cold with pressure to left side of her cheek few days back along with yellowish nasal drainage. denies pain to the sinus area, runny nose or cough. She feels the symptoms to have improved in last day or so. She has been seeing Dr Felipa Eth prior to this as her PCP and was last seen by their NP on 12/12/17. Daughter present during this visit with the patient. She has medical history of IBS with constipation, peripheral neuropathy, fibromyalgia, BPV, chronic sinusitis, endometrial cancer s/p hysterectomy among others. She is on medication to help with her mood and depression, she has chronic constipation and is on bowel regimen. Patient has not brought her medications this visit.  Reviewed immunization history from prior records.  zostavax 06/01/2007 tdap 06/07/10 pcv 13 06/27/14 ppv 23 05/31/2002 fluzone 09/23/17   Past Medical History:  Diagnosis Date  . BPV (benign positional  vertigo)   . Cancer [facility])    uterine cancer  . Carpal tunnel syndrome on left 06/02/2017  . Cognitive changes   . Depression   . Droopy eyelid, right   . 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 Dyslipidemia   . Fibromyalgia   . Granuloma annulare   . History of uterine cancer 2003   treated with hysterectomy  . Lumbar radicular syndrome    left L5  . Macular degeneration    left eye  . Sciatica   . TIA (transient ischemic attack)    Past Surgical History:  Procedure Laterality Date  . ABDOMINAL HYSTERECTOMY    . BREAST BIOPSY  2007  . CATARACT EXTRACTION    . TONSILLECTOMY      reports that  has never smoked. she has never used smokeless tobacco. She reports that she does not drink alcohol or use drugs. Social History   Socioeconomic History  . Marital status: Married    Spouse name: Not on file  . Number of children: Not on file  . Years of education: 50  . Highest education level: Not on file  Social Needs  . Financial resource strain: Not on file  . Food insecurity - worry: Not on file  . Food insecurity - inability: Not on file  . Transportation needs - medical: Not on file  . Transportation needs - non-medical: Not on file  Occupational History  . Occupation: Retired Warden/ranger  Tobacco Use  . Smoking status: Never Smoker  . Smokeless tobacco: Never Used  Substance and  Sexual Activity  . Alcohol use: No  . Drug use: No  . Sexual activity: Not on file  Other Topics Concern  . Not on file  Social History Narrative   Lives at home w/ her husband   Left-handed   Caffeine: tea once a day    Functional Status Survey:    Family History  Problem Relation Age of Onset  . Depression Mother   . Diabetes Father   . Heart attack Father   . Dementia Sister   . Pneumonia Brother   . Breast cancer Daughter     Health Maintenance  Topic Date Due  . DEXA SCAN  06/22/1999  . TETANUS/TDAP  06/07/2020  . INFLUENZA VACCINE  Completed  . PNA vac Low  Risk Adult  Completed    Allergies  Allergen Reactions  . Aricept [Donepezil Hcl]     Muscle cramps  . Latex Itching  . Septra [Sulfamethoxazole-Trimethoprim]   . Tramadol Other (See Comments)    Pt can't remember what side effects she had, she doesn't take it now    Outpatient Encounter Medications as of 02/01/2018  Medication Sig  . ASPIRIN 81 PO Take 1 tablet by mouth daily.  Marland Kitchen CALCIUM-VITAMIN D PO Take 1 tablet by mouth daily. 600mg  calcium, 800 units Vit D  . memantine (NAMENDA) 10 MG tablet TAKE 1 TABLET BY MOUTH TWICE DAILY.  . mirtazapine (REMERON) 30 MG tablet Take 30 mg by mouth at bedtime.   . Multiple Vitamins-Minerals (CENTRUM SILVER PO) Take 1 tablet by mouth daily.  . Multiple Vitamins-Minerals (PRESERVISION AREDS PO) Take 2 capsules by mouth daily.   . sertraline (ZOLOFT) 25 MG tablet Take 50 mg by mouth daily.   Marland Kitchen triamcinolone cream (KENALOG) 0.1 % Apply 1 application topically 2 (two) times daily.  Marland Kitchen VAYACOG 100-19.5-6.5 MG CAPS TAKE (1) CAPSULE DAILY.  . vitamin B-12 (CYANOCOBALAMIN) 1000 MCG tablet Take 1,000 mcg by mouth daily.  Marland Kitchen loratadine (CLARITIN) 10 MG tablet Take 1 tablet (10 mg total) by mouth daily.   No facility-administered encounter medications on file as of 02/01/2018.     Review of Systems  Constitutional: Negative for appetite change, chills, fatigue and fever.  HENT: Positive for postnasal drip and sinus pressure. Negative for congestion, ear pain, mouth sores, nosebleeds, sneezing, sore throat, tinnitus and trouble swallowing.   Eyes: Positive for visual disturbance. Negative for pain, discharge and itching.       Seen by eye doctor Nov/December 2018. S/p cataract surgery to both eyes. Has macular degeneration. Seen by Dr Janyth Contes.  Respiratory: Negative for cough, chest tightness, shortness of breath and wheezing.        Has had episode of choking a month back  Cardiovascular: Negative for chest pain, palpitations and leg swelling.    Gastrointestinal: Negative for abdominal pain, blood in stool, constipation, diarrhea, nausea and vomiting.       Hard stools, strains some, colonoscopy in past have been normal.   Genitourinary: Negative for dysuria, frequency, hematuria, vaginal bleeding and vaginal discharge.       Has ckd stage 3   Musculoskeletal: Positive for gait problem. Negative for back pain.       Unsteady gait, uses a cane, no fall reported in last 1 year, has history of eczema  Skin: Negative for rash.       Has history of eczema Dr Arnetha Massy dermatology  Neurological: Negative for dizziness, seizures, syncope, numbness and headaches.  Hematological: Does not bruise/bleed easily.  Not taking aspirin at present  Psychiatric/Behavioral: Positive for dysphoric mood. Negative for confusion, hallucinations and sleep disturbance. The patient is not nervous/anxious.        History of depression, husband under hospice and at SNF    Vitals:   02/01/18 1406  BP: 122/60  Pulse: 84  Resp: 16  Temp: 97.9 F (36.6 C)  TempSrc: Oral  SpO2: 94%  Weight: 131 lb 9.6 oz (59.7 kg)  Height: 5' 3.5" (1.613 m)   Body mass index is 22.95 kg/m. Physical Exam  Constitutional: She is oriented to person, place, and time. She appears well-developed and well-nourished. No distress.  HENT:  Head: Normocephalic and atraumatic.  Right Ear: External ear normal.  Left Ear: External ear normal.  Nose: Nose normal.  Mouth/Throat: Oropharynx is clear and moist. No oropharyngeal exudate.  Some cerumen to both ears  Eyes: Conjunctivae and EOM are normal. Pupils are equal, round, and reactive to light. Right eye exhibits no discharge. Left eye exhibits no discharge.  Neck: Normal range of motion. Neck supple.  Cardiovascular: Normal rate and regular rhythm.  Pulmonary/Chest: Effort normal and breath sounds normal. She has no wheezes. She has no rales.  Abdominal: Soft. Bowel sounds are normal. There is no tenderness. There is  no rebound and no guarding.  Musculoskeletal: She exhibits no edema.  Lymphadenopathy:    She has no cervical adenopathy.  Neurological: She is alert and oriented to person, place, and time.  MMSE 24/30 on 7/17  Skin: Skin is warm and dry. No rash noted. She is not diaphoretic.  Eczema with erythematous spots to her knuckles  Psychiatric: She has a normal mood and affect. Her behavior is normal.    Labs reviewed: Basic Metabolic Panel: Recent Labs    03/19/17 1158  NA 133*  K 4.8  CL 94*  CO2 24  GLUCOSE 87  BUN 17  CREATININE 1.00  CALCIUM 9.2   Liver Function Tests: No results for input(s): AST, ALT, ALKPHOS, BILITOT, PROT, ALBUMIN in the last 8760 hours. No results for input(s): LIPASE, AMYLASE in the last 8760 hours. No results for input(s): AMMONIA in the last 8760 hours. CBC: No results for input(s): WBC, NEUTROABS, HGB, HCT, MCV, PLT in the last 8760 hours. Cardiac Enzymes: No results for input(s): CKTOTAL, CKMB, CKMBINDEX, TROPONINI in the last 8760 hours. BNP: Invalid input(s): POCBNP No results found for: HGBA1C Lab Results  Component Value Date   TSH 3.520 11/16/2014   Lab Results  Component Value Date   YNWGNFAO13 086 10/30/2016   No results found for: FOLATE No results found for: IRON, TIBC, FERRITIN  Lipid Panel: No results for input(s): CHOL, HDL, LDLCALC, TRIG, CHOLHDL, LDLDIRECT in the last 8760 hours. No results found for: HGBA1C  Procedures since last visit: No results found.  Assessment/Plan  1. CKD (chronic kidney disease) stage 3, GFR 30-59 ml/min (HCC) Will review labs from her prior PCP visits, maintain hydration, avoid NSAIDs  2. Impaired memory Continue memantine for now, dd not tolerate donepezil in past, f/b dr Jannifer Franklin  3. Eczema, unspecified type Daughter mentions pt is on hydrocortisone cream, no med available for review. advised to bring all her medications next visit.   4. Major depression, chronic Continue remeron and  sertraline current dosing. Monitor mood. Obtain PHQ9 next visit.   5. Nasal congestion On chart review has history of ? Chronic sinusitis. On exam, no findings suggestive of acute flare up. Supportive care for now, advised to take loratadine 10 mg  daily for now. If no improvement consider flonase.   Labs/tests ordered:  None, need to review prior records  Next appointment: 2 weeks for follow up on sinus congestion and mood.   Communication: reviewed care plan with patient and her daughter    Blanchie Serve, MD Internal Medicine [place], [place] [address] Cell Phone (Monday-Friday 8 am - 5 pm): [phone] On Call: [phone] and follow prompts after 5 pm and on weekends Office Phone: [phone] Office Fax: [phone]

## 2018-02-01 NOTE — Patient Instructions (Signed)
I want you to take claritin daily at bedtime for 1 week to see if this will help with your sinus pressure and congestion.   Please bring all your medications with you on your next visit including your supplements.  I would like medical records from your psychiatry, eye and skin doctor. Please make sure a medical release form is signed and provide name and contact information for these providers.

## 2018-02-02 ENCOUNTER — Encounter: Payer: Self-pay | Admitting: Internal Medicine

## 2018-02-02 MED ORDER — LORATADINE 10 MG PO TABS: 10.0000 mg | ORAL_TABLET | Freq: Every day | ORAL | 11 refills | Status: DC

## 2018-02-05 DIAGNOSIS — F4321 Adjustment disorder with depressed mood: Secondary | ICD-10-CM | POA: Diagnosis not present

## 2018-02-08 ENCOUNTER — Telehealth: Payer: Self-pay

## 2018-02-08 NOTE — Telephone Encounter (Signed)
Will forward to [place] since she was involved in this conversation. I'm not aware of what has been discussed.

## 2018-02-08 NOTE — Telephone Encounter (Signed)
Patient's daughter called and requested to speak with [place].   [name] states her and [place] had a conversation about medications and its really a lot to explain. [name] would like to know when she can come to [facility] clinic to review patient's medications.  I tried to schedule patient an appointment and [name] refused and states she and [place] had discussed her just dropping by.  [place] or Kim please follow-up with [name] (patient's daughter) to orchestrate medication review with medication bottles

## 2018-02-09 NOTE — Telephone Encounter (Signed)
Patient's daughter to bring pill bottles down to the clinic.

## 2018-02-15 ENCOUNTER — Non-Acute Institutional Stay: Payer: Medicare Other | Admitting: Internal Medicine

## 2018-02-15 ENCOUNTER — Ambulatory Visit (INDEPENDENT_AMBULATORY_CARE_PROVIDER_SITE_OTHER): Payer: Medicare Other | Admitting: Licensed Clinical Social Worker

## 2018-02-15 ENCOUNTER — Encounter: Payer: Self-pay | Admitting: Internal Medicine

## 2018-02-15 VITALS — BP 124/70 | HR 80 | Temp 97.9°F | Resp 16 | Ht 64.0 in | Wt 132.0 lb

## 2018-02-15 DIAGNOSIS — F341 Dysthymic disorder: Secondary | ICD-10-CM | POA: Diagnosis not present

## 2018-02-15 DIAGNOSIS — F3341 Major depressive disorder, recurrent, in partial remission: Secondary | ICD-10-CM | POA: Diagnosis not present

## 2018-02-15 DIAGNOSIS — F329 Major depressive disorder, single episode, unspecified: Secondary | ICD-10-CM

## 2018-02-15 DIAGNOSIS — R0981 Nasal congestion: Secondary | ICD-10-CM | POA: Diagnosis not present

## 2018-02-15 NOTE — Progress Notes (Signed)
[facility]  Provider: Blanchie Serve MD   Location:  [place] of Service:  Clinic (12)  PCP: Blanchie Serve, MD Patient Care Team: Blanchie Serve, MD as PCP - General (Internal Medicine) [name], MD as Referring Physician (Physical Medicine and Rehabilitation) Ngetich, Nelda Bucks, NP as Nurse Practitioner (Family Medicine)  Extended Emergency Contact Information Primary Emergency Contact: [name],  [place] Phone: [phone] Relation: Daughter Secondary Emergency Contact: Myers,Lois  [place] Phone: [phone] Relation: Daughter  Goals of Care: Advanced Directive information Advanced Directives 01/13/2017  Does Patient Have a Medical Advance Directive? Yes  Type of Advance Directive Living will     Chief Complaint  Patient presents with  . Acute Visit    2 week follow up for congestion and mood. Patient stated that her mood seems stable and that her congestion has improved.   . Medication Refill    No refills needed at this time    HPI: [name] seen today for follow up on her mood and congestion.   Congestion- her nasal congestion with minimal nasal discharge, white in color, no blood reported. No fever or chills. Appetite good.   Depression- currently taking sertraline and remeron. [name]. Here with her daughter. She also sees psychologist at [facility] once a week.   zostavax 06/01/2007 tdap 06/07/10 pcv 13 06/27/14 ppv 23 05/31/2002 fluzone 09/23/17   Past Medical History:  Diagnosis Date  . BPV (benign positional vertigo)   . Cancer [facility])    uterine cancer  . Carpal tunnel syndrome on left 06/02/2017  . Cognitive changes   . Depression   . Droopy eyelid, right   . 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 Dyslipidemia   . Fibromyalgia   . Granuloma annulare     . History of uterine cancer 2003   treated with hysterectomy  . Lumbar radicular syndrome    left L5  . Macular degeneration    left eye  . Sciatica   . TIA (transient ischemic attack)    Past Surgical History:  Procedure Laterality Date  . ABDOMINAL HYSTERECTOMY    . BREAST BIOPSY  2007  . CATARACT EXTRACTION    . TONSILLECTOMY      reports that  has never smoked. she has never used smokeless tobacco. She reports that she does not drink alcohol or use drugs. Social History   Socioeconomic History  . Marital status: Married    Spouse name: Not on file  . Number of children: Not on file  . Years of education: 15  . Highest education level: Not on file  Social Needs  . Financial resource strain: Not on file  . Food insecurity - worry: Not on file  . Food insecurity - inability: Not on file  . Transportation needs - medical: Not on file  . Transportation needs - non-medical: Not on file  Occupational History  . Occupation: Retired Warden/ranger  Tobacco Use  . Smoking status: Never Smoker  . Smokeless tobacco: Never Used  Substance and Sexual Activity  . Alcohol use: No  . Drug use: No  . Sexual activity: Not on file  Other Topics Concern  . Not on file  Social History Narrative   Lives at home w/ her husband   Left-handed  Caffeine: tea once a day    Functional Status Survey:    Family History  Problem Relation Age of Onset  . Depression Mother   . Diabetes Father   . Heart attack Father   . Dementia Sister   . Pneumonia Brother   . Breast cancer Daughter     Health Maintenance  Topic Date Due  . DEXA SCAN  06/22/1999  . TETANUS/TDAP  06/07/2020  . INFLUENZA VACCINE  Completed  . PNA vac Low Risk Adult  Completed    Allergies  Allergen Reactions  . Aricept [Donepezil Hcl]     Muscle cramps  . Latex Itching  . Septra [Sulfamethoxazole-Trimethoprim]   . Tramadol Other (See Comments)    Pt can't remember what side effects she had, she  doesn't take it now    Outpatient Encounter Medications as of 02/15/2018  Medication Sig  . ASPIRIN 81 PO Take 1 tablet by mouth daily.  Marland Kitchen CALCIUM-VITAMIN D PO Take 1 tablet by mouth daily. 600mg  calcium, 800 units Vit D  . loratadine (CLARITIN) 10 MG tablet Take 1 tablet (10 mg total) by mouth daily.  . memantine (NAMENDA) 10 MG tablet TAKE 1 TABLET BY MOUTH TWICE DAILY.  . mirtazapine (REMERON) 30 MG tablet Take 30 mg by mouth at bedtime.   . Multiple Vitamins-Minerals (CENTRUM SILVER PO) Take 1 tablet by mouth daily.  . Multiple Vitamins-Minerals (PRESERVISION AREDS PO) Take 2 capsules by mouth daily.   . sertraline (ZOLOFT) 25 MG tablet Take 50 mg by mouth daily.   Marland Kitchen triamcinolone cream (KENALOG) 0.1 % Apply 1 application topically 2 (two) times daily.  Marland Kitchen VAYACOG 100-19.5-6.5 MG CAPS TAKE (1) CAPSULE DAILY.  . vitamin B-12 (CYANOCOBALAMIN) 1000 MCG tablet Take 1,000 mcg by mouth daily.   No facility-administered encounter medications on file as of 02/15/2018.     Review of Systems  Constitutional: Negative for appetite change, chills and fever.  HENT: Positive for postnasal drip. Negative for congestion, ear discharge, ear pain, mouth sores, sinus pressure, sneezing, tinnitus and trouble swallowing.   Eyes: Positive for visual disturbance. Negative for pain, discharge and itching.       Seen by eye doctor Nov/December 2018. S/p cataract surgery to both eyes. Has macular degeneration. Seen by Dr Janyth Contes.  Respiratory: Negative for cough, chest tightness, shortness of breath and wheezing.        Has had episode of choking a month back  Cardiovascular: Negative for chest pain and palpitations.  Genitourinary: Negative for dysuria, frequency, hematuria, vaginal bleeding and vaginal discharge.       Has ckd stage 3   Musculoskeletal: Positive for gait problem. Negative for back pain.       Unsteady gait, uses a cane, no fall reported in last 1 year, has history of eczema  Skin: Negative  for rash.       Has history of eczema Dr Arnetha Massy dermatology  Neurological: Negative for dizziness, seizures, syncope, numbness and headaches.  Hematological: Does not bruise/bleed easily.       Not taking aspirin at present  Psychiatric/Behavioral: Negative for confusion, dysphoric mood, hallucinations and sleep disturbance. The patient is not nervous/anxious.        History of depression, husband under hospice and at SNF    Vitals:   02/15/18 1606  BP: 124/70  Pulse: 80  Resp: 16  Temp: 97.9 F (36.6 C)  TempSrc: Oral  SpO2: 96%  Weight: 132 lb (59.9 kg)  Height: 5\' 4"  (1.626  m)   Body mass index is 22.66 kg/m. Physical Exam  Constitutional: She is oriented to person, place, and time. She appears well-developed and well-nourished. No distress.  HENT:  Head: Normocephalic and atraumatic.  Right Ear: External ear normal.  Left Ear: External ear normal.  Nose: Nose normal.  Mouth/Throat: Oropharynx is clear and moist. No oropharyngeal exudate.  No sinus tenderness  Eyes: Conjunctivae and EOM are normal. Pupils are equal, round, and reactive to light. Right eye exhibits no discharge. Left eye exhibits no discharge.  Neck: Normal range of motion. Neck supple.  Cardiovascular: Normal rate and regular rhythm.  Pulmonary/Chest: Effort normal and breath sounds normal.  Abdominal: Soft. Bowel sounds are normal.  Musculoskeletal: She exhibits no edema.  Lymphadenopathy:    She has no cervical adenopathy.  Neurological: She is alert and oriented to person, place, and time.  MMSE 24/30 on 7/17  Skin: Skin is warm and dry. She is not diaphoretic.  Eczema   Psychiatric: She has a normal mood and affect. Her behavior is normal.    Labs reviewed: Basic Metabolic Panel: Recent Labs    03/19/17 1158  NA 133*  K 4.8  CL 94*  CO2 24  GLUCOSE 87  BUN 17  CREATININE 1.00  CALCIUM 9.2   Liver Function Tests: No results for input(s): AST, ALT, ALKPHOS, BILITOT, PROT, ALBUMIN  in the last 8760 hours. No results for input(s): LIPASE, AMYLASE in the last 8760 hours. No results for input(s): AMMONIA in the last 8760 hours. CBC: No results for input(s): WBC, NEUTROABS, HGB, HCT, MCV, PLT in the last 8760 hours. Cardiac Enzymes: No results for input(s): CKTOTAL, CKMB, CKMBINDEX, TROPONINI in the last 8760 hours. BNP: Invalid input(s): POCBNP No results found for: HGBA1C Lab Results  Component Value Date   TSH 3.520 11/16/2014   Lab Results  Component Value Date   [id number] 10/30/2016   No results found for: FOLATE No results found for: IRON, TIBC, FERRITIN  Lipid Panel: No results for input(s): CHOL, HDL, LDLCALC, TRIG, CHOLHDL, LDLDIRECT in the last 8760 hours. No results found for: HGBA1C  Procedures since last visit: No results found.  Assessment/Plan  1. Major depression, chronic Stable, PHQ-9 score 1. Continue current regimen of sertraline and remeron, encouraged to be engaged in group activities. Continue psychology counselling sessions.   2. Nasal congestion Improved. Change claritin to daily as needed, daughter has concerns about her remembering to take it on as needed basis. So, hold off claritin for 1 week and if symptoms recur, resume claritin 10 mg every other day. Daughter agrees.    Labs/tests ordered:  none  Next appointment: 3 months  Communication: reviewed care plan with patient and her daughter    Blanchie Serve, MD Internal Medicine [place], [place] [address] Cell Phone (Monday-Friday 8 am - 5 pm): [phone] On Call: [phone] and follow prompts after 5 pm and on weekends Office Phone: [phone] Office Fax: [phone]

## 2018-03-01 ENCOUNTER — Encounter: Payer: Self-pay | Admitting: Internal Medicine

## 2018-03-08 ENCOUNTER — Encounter: Payer: Self-pay | Admitting: Internal Medicine

## 2018-03-11 ENCOUNTER — Ambulatory Visit (INDEPENDENT_AMBULATORY_CARE_PROVIDER_SITE_OTHER): Payer: Medicare Other | Admitting: Licensed Clinical Social Worker

## 2018-03-11 DIAGNOSIS — F3341 Major depressive disorder, recurrent, in partial remission: Secondary | ICD-10-CM

## 2018-03-22 ENCOUNTER — Telehealth: Payer: Self-pay | Admitting: Internal Medicine

## 2018-03-22 NOTE — Telephone Encounter (Signed)
I called the pt to schedule AWV-S.  I spoke with her daughter, Janet [name], who stated that the pt's husband just passed.  She asked that I call her back on her cell phone number in a few weeks to schedule the AWV. VDM (DD)

## 2018-03-29 ENCOUNTER — Ambulatory Visit: Payer: Medicare Other | Admitting: Licensed Clinical Social Worker

## 2018-04-08 ENCOUNTER — Encounter: Payer: Self-pay | Admitting: Neurology

## 2018-04-13 ENCOUNTER — Encounter: Payer: Self-pay | Admitting: Neurology

## 2018-04-15 ENCOUNTER — Telehealth: Payer: Self-pay | Admitting: Internal Medicine

## 2018-04-15 NOTE — Telephone Encounter (Signed)
I called the patient's daughter, [name], to schedule AWV-S with Clarise Cruz.  She asked if it can be done with the 05/17/18 appt, and I told her I would ask.  She also wanted to know if someone has had a chance to review all of her mom's records from Dr. Felipa Eth yet.  She wants to make sure that she is up-to-date on all of her bloodwork, follow-ups, etc.  I explained that that is part of what Clarise Cruz does, but she still requested that someone review all of her mom's records before the 6/3 appt.  Please advise.

## 2018-04-16 NOTE — Telephone Encounter (Signed)
I will try and review patient's medical records as I receive them from her providers. It should all be reviewed prior to her visit in June if they are sent. I wont be able to do annual wellness visit during that visit as it will be first proper encounter after reviewing her prior medical records and I will focus that visit on addressing her chronic medical issues. Please notify Janet [name] of the same. Thank you.

## 2018-04-19 ENCOUNTER — Encounter: Payer: Self-pay | Admitting: Internal Medicine

## 2018-04-19 NOTE — Telephone Encounter (Signed)
Noted! Thank you

## 2018-04-21 ENCOUNTER — Encounter: Payer: Self-pay | Admitting: Internal Medicine

## 2018-04-21 ENCOUNTER — Non-Acute Institutional Stay: Payer: Medicare Other | Admitting: Internal Medicine

## 2018-04-21 VITALS — BP 116/70 | HR 76 | Temp 98.0°F | Resp 18 | Ht 64.0 in | Wt 131.0 lb

## 2018-04-21 DIAGNOSIS — Z7189 Other specified counseling: Secondary | ICD-10-CM

## 2018-04-21 DIAGNOSIS — G479 Sleep disorder, unspecified: Secondary | ICD-10-CM

## 2018-04-21 DIAGNOSIS — F339 Major depressive disorder, recurrent, unspecified: Secondary | ICD-10-CM | POA: Diagnosis not present

## 2018-04-21 MED ORDER — MELATONIN 3 MG PO TABS: 1.0000 | ORAL_TABLET | Freq: Every evening | ORAL | 0 refills | Status: AC | PRN

## 2018-04-21 NOTE — Progress Notes (Signed)
[facility]  Provider: Blanchie Serve MD   Location:  [place] of Service:  Clinic (12)  PCP: Blanchie Serve, MD Patient Care Team: Blanchie Serve, MD as PCP - General (Internal Medicine) [name], MD as Referring Physician (Physical Medicine and Rehabilitation) Ngetich, Nelda Bucks, NP as Nurse Practitioner (Family Medicine)  Extended Emergency Contact Information Primary Emergency Contact: [name], [place] of [place] Phone: [phone] Relation: Daughter Secondary Emergency Contact: Myers,Lois  [place] Phone: [phone] Relation: Daughter   Goals of Care: Advanced Directive information Advanced Directives 01/13/2017  Does Patient Have a Medical Advance Directive? Yes  Type of Advance Directive Living will      Chief Complaint  Patient presents with  . Acute Visit    insomnia, medication reconciliation, question about seeing her psychiatrisyt    HPI: [name] seen today for acute visit. She is having trouble falling and staying asleep for past few weeks. This has been prominent after the loss of her husband. She goes to bed around 11 pm. She wakes up at 8 pm. She has been trying this herbal tea at bedtime for 2 days with some help. Her nurse from an agency is present with her this visit. She would like to know if her medications can be administered in morning and evening rather than 4 times a day for ease and compliance. She also would like to know if she can stop seeing her psychiatrist as she feels his service is not helpful. Of note, she has been followed by psychiatry service for several years.   Past Medical History:  Diagnosis Date  . BPV (benign positional vertigo)   . Cancer [facility])    uterine cancer  . Carpal tunnel syndrome on left 06/02/2017  . Cognitive changes   . Depression   . Droopy eyelid, right   . 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  Dyslipidemia   . Fibromyalgia   . Granuloma annulare   . History of uterine cancer 2003   treated with hysterectomy  . Lumbar radicular syndrome    left L5  . Macular degeneration    left eye  . Sciatica   . TIA (transient ischemic attack)    Past Surgical History:  Procedure Laterality Date  . ABDOMINAL HYSTERECTOMY    . BREAST BIOPSY  2007  . CATARACT EXTRACTION    . TONSILLECTOMY      reports that she has never smoked. She has never used smokeless tobacco. She reports that she does not drink alcohol or use drugs. Social History   Socioeconomic History  . Marital status: Married    Spouse name: Not on file  . Number of children: Not on file  . Years of education: 57  . Highest education level: Not on file  Occupational History  . Occupation: Retired Warden/ranger  Social Needs  . Financial resource strain: Not on file  . Food insecurity:    Worry: Not on file    Inability: Not on file  . Transportation needs:    Medical: Not on file    Non-medical: Not on file  Tobacco Use  . Smoking status: Never Smoker  . Smokeless tobacco: Never Used  Substance and Sexual Activity  . Alcohol use: No  . Drug use: No  . Sexual activity: Not on file  Lifestyle  . Physical activity:  Days per week: Not on file    Minutes per session: Not on file  . Stress: Not on file  Relationships  . Social connections:    Talks on phone: Not on file    Gets together: Not on file    Attends religious service: Not on file    Active member of club or organization: Not on file    Attends meetings of clubs or organizations: Not on file    Relationship status: Not on file  . Intimate partner violence:    Fear of current or ex partner: Not on file    Emotionally abused: Not on file    Physically abused: Not on file    Forced sexual activity: Not on file  Other Topics Concern  . Not on file  Social History Narrative   Lives at home w/ her husband   Left-handed   Caffeine: tea  once a day     Family History  Problem Relation Age of Onset  . Depression Mother   . Diabetes Father   . Heart attack Father   . Dementia Sister   . Pneumonia Brother   . Breast cancer Daughter     Health Maintenance  Topic Date Due  . DEXA SCAN  06/22/1999  . INFLUENZA VACCINE  07/15/2018  . TETANUS/TDAP  06/07/2020  . PNA vac Low Risk Adult  Completed    Allergies  Allergen Reactions  . Aricept [Donepezil Hcl]     Muscle cramps  . Latex Itching  . Septra [Sulfamethoxazole-Trimethoprim]   . Tramadol Other (See Comments)    Pt can't remember what side effects she had, she doesn't take it now    Outpatient Encounter Medications as of 04/21/2018  Medication Sig  . ASPIRIN 81 PO Take 1 tablet by mouth daily.  Marland Kitchen CALCIUM-VITAMIN D PO Take 1 tablet by mouth daily. 600mg  calcium, 800 units Vit D  . memantine (NAMENDA) 10 MG tablet TAKE 1 TABLET BY MOUTH TWICE DAILY.  . mirtazapine (REMERON) 30 MG tablet Take 30 mg by mouth at bedtime.   . Multiple Vitamins-Minerals (CENTRUM SILVER PO) Take 1 tablet by mouth daily.  . Multiple Vitamins-Minerals (PRESERVISION AREDS PO) Take 2 capsules by mouth daily.   . sertraline (ZOLOFT) 25 MG tablet Take 50 mg by mouth daily.   Marland Kitchen triamcinolone cream (KENALOG) 0.1 % Apply 1 application topically as needed.   Marland Kitchen VAYACOG 100-19.5-6.5 MG CAPS TAKE (1) CAPSULE DAILY.  . vitamin B-12 (CYANOCOBALAMIN) 1000 MCG tablet Take 1,000 mcg by mouth daily.  Marland Kitchen loratadine (CLARITIN) 10 MG tablet Take 1 tablet (10 mg total) by mouth daily. (Patient not taking: Reported on 04/21/2018)   No facility-administered encounter medications on file as of 04/21/2018.     Review of Systems  Constitutional: Negative for appetite change, chills, fatigue and fever.  Respiratory: Negative for shortness of breath.   Cardiovascular: Negative for chest pain.  Psychiatric/Behavioral: Positive for decreased concentration and sleep disturbance. Negative for dysphoric mood,  self-injury and suicidal ideas. The patient is not nervous/anxious.        Has been on medication for depression for several years    Vitals:   04/21/18 1131  BP: 116/70  Pulse: 76  Resp: 18  Temp: 98 F (36.7 C)  TempSrc: Oral  SpO2: 95%  Weight: 131 lb (59.4 kg)  Height: 5\' 4"  (1.626 m)   Body mass index is 22.49 kg/m. Physical Exam  Constitutional: She is oriented to person, place, and time. She  appears well-developed and well-nourished. No distress.  HENT:  Head: Normocephalic and atraumatic.  Neck: Neck supple.  Cardiovascular: Normal rate and regular rhythm.  Pulmonary/Chest: Effort normal and breath sounds normal.  Musculoskeletal: Normal range of motion.  Neurological: She is alert and oriented to person, place, and time.  Skin: Skin is warm and dry. She is not diaphoretic.  Psychiatric: She has a normal mood and affect. Her behavior is normal.    Labs reviewed: Basic Metabolic Panel: No results for input(s): NA, K, CL, CO2, GLUCOSE, BUN, CREATININE, CALCIUM, MG, PHOS in the last 8760 hours. Liver Function Tests: No results for input(s): AST, ALT, ALKPHOS, BILITOT, PROT, ALBUMIN in the last 8760 hours. No results for input(s): LIPASE, AMYLASE in the last 8760 hours. No results for input(s): AMMONIA in the last 8760 hours. CBC: No results for input(s): WBC, NEUTROABS, HGB, HCT, MCV, PLT in the last 8760 hours. Cardiac Enzymes: No results for input(s): CKTOTAL, CKMB, CKMBINDEX, TROPONINI in the last 8760 hours. BNP: Invalid input(s): POCBNP No results found for: HGBA1C Lab Results  Component Value Date   TSH 3.520 11/16/2014   Lab Results  Component Value Date   [id number] 10/30/2016   No results found for: FOLATE No results found for: IRON, TIBC, FERRITIN  Lipid Panel: No results for input(s): CHOL, HDL, LDLCALC, TRIG, CHOLHDL, LDLDIRECT in the last 8760 hours. No results found for: HGBA1C  Procedures since last visit: No results found.      Nursing Home from 04/21/2018 in [place]  PHQ-9 Total Score  4      Assessment/Plan  1. Trouble in sleeping Likely situational with recent loss of her husband. Sleep hygiene, continue current herbal tea - noncaffine and take melatonin only if needed to aid sleep.  - Melatonin 3 MG TABS; Take 1 tablet (3 mg total) by mouth at bedtime as needed.  Dispense: 30 tablet; Refill: 0  2. Encounter for medication counseling Advised to take morning and noon medication all in the am and evening and bedtime medication all in evening to help avoid taking medications 4 times a day.  3. Major depression, recurrent, chronic (HCC) PHQ9 is 4. continue mirtazapine and sertraline. Advised to continue follow up with psychiatry service.    Next appointment: has follow up in june  Communication: reviewed care plan with patient and her nurse.     Blanchie Serve, MD Internal Medicine [facility] [address] [place], [place] [address] Cell Phone (Monday-Friday 8 am - 5 pm): [phone] On Call: [phone] and follow prompts after 5 pm and on weekends Office Phone: [phone] Office Fax: [phone]

## 2018-05-03 ENCOUNTER — Telehealth: Payer: Self-pay | Admitting: *Deleted

## 2018-05-03 ENCOUNTER — Other Ambulatory Visit: Payer: Self-pay | Admitting: Neurology

## 2018-05-03 NOTE — Telephone Encounter (Signed)
I called the patient, the Vyacog is back ordered, we will restart the medication whenever it becomes available.

## 2018-05-16 ENCOUNTER — Encounter: Payer: Self-pay | Admitting: Neurology

## 2018-05-17 ENCOUNTER — Non-Acute Institutional Stay: Payer: Medicare Other | Admitting: Internal Medicine

## 2018-05-17 ENCOUNTER — Encounter: Payer: Self-pay | Admitting: Internal Medicine

## 2018-05-17 VITALS — BP 114/62 | HR 80 | Temp 98.2°F | Resp 18 | Ht 64.0 in | Wt 132.4 lb

## 2018-05-17 DIAGNOSIS — E538 Deficiency of other specified B group vitamins: Secondary | ICD-10-CM

## 2018-05-17 DIAGNOSIS — R29898 Other symptoms and signs involving the musculoskeletal system: Secondary | ICD-10-CM | POA: Diagnosis not present

## 2018-05-17 DIAGNOSIS — F5102 Adjustment insomnia: Secondary | ICD-10-CM | POA: Diagnosis not present

## 2018-05-17 DIAGNOSIS — F339 Major depressive disorder, recurrent, unspecified: Secondary | ICD-10-CM | POA: Diagnosis not present

## 2018-05-17 DIAGNOSIS — R413 Other amnesia: Secondary | ICD-10-CM | POA: Diagnosis not present

## 2018-05-17 DIAGNOSIS — Z8639 Personal history of other endocrine, nutritional and metabolic disease: Secondary | ICD-10-CM | POA: Diagnosis not present

## 2018-05-17 DIAGNOSIS — R300 Dysuria: Secondary | ICD-10-CM

## 2018-05-17 NOTE — Progress Notes (Signed)
[facility]  Provider: Blanchie Serve MD   Location:      Place of Service:     PCP: Blanchie Serve, MD Patient Care Team: Blanchie Serve, MD as PCP - General (Internal Medicine) [name], MD as Referring Physician (Physical Medicine and Rehabilitation) Ngetich, Nelda Bucks, NP as Nurse Practitioner (Family Medicine)  Extended Emergency Contact Information Primary Emergency Contact: [name], [place] Phone: [phone] Relation: Daughter Secondary Emergency Contact: Myers,Lois  [place] Phone: [phone] Relation: Daughter   Goals of Care: Advanced Directive information Advanced Directives 05/17/2018  Does Patient Have a Medical Advance Directive? Yes  Type of Paramedic of [place];Living will  Does patient want to make changes to medical advance directive? No - Patient declined  Copy of [place] in Chart? Yes      Chief Complaint  Patient presents with  . Medical Management of Chronic Issues    3 months follow up  . Medication Refill    No refills needed at this time.     HPI: [name] seen today for routine visit. She is here with her daughter.   Chronic depression- seen by Dr Casimiro Needle, last visit 02/05/18 notes reviewed. Recently lost her husband. Currently on remeron 30 mg daily and zoloft 50 mg daily. Has upcoming follow up.   Insomnia- melatonin helps some. She stays asleep once she is able to fall asleep. She goes to bed around 11 and falls asleep by 11:30 pm and wakes up at 7 am.   Dementia-  Taking memantine 10 mg bid, mood and behavior overall stable. Supportive family.   Allergic rhinitis- has not required loratadine, symptom free  Of note, patient has noticed her jaw to click when she opens her mouth. Denies any jaw or neck pain. Denies earache, tinnitus or mouth pain. Denies headache.   Past Medical  History:  Diagnosis Date  . BPV (benign positional vertigo)   . Cancer [facility])    uterine cancer  . Carpal tunnel syndrome on left 06/02/2017  . Cognitive changes   . Depression   . Droopy eyelid, right   . 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 Dyslipidemia   . Fibromyalgia   . Granuloma annulare   . History of uterine cancer 2003   treated with hysterectomy  . Lumbar radicular syndrome    left L5  . Macular degeneration    left eye  . Sciatica   . TIA (transient ischemic attack)    Past Surgical History:  Procedure Laterality Date  . ABDOMINAL HYSTERECTOMY    . BREAST BIOPSY  2007  . CATARACT EXTRACTION    . TONSILLECTOMY      reports that she has never smoked. She has never used smokeless tobacco. She reports that she does not drink alcohol or use drugs. Social History   Socioeconomic History  . Marital status: Married    Spouse name: Not on file  . Number of children: Not on file  . Years of education: 63  . Highest education level: Not on file  Occupational History  . Occupation: Retired Warden/ranger  Social Needs  . Financial resource strain: Not on file  . Food insecurity:    Worry: Not on file    Inability: Not on file  . Transportation needs:    Medical: Not on file  Non-medical: Not on file  Tobacco Use  . Smoking status: Never Smoker  . Smokeless tobacco: Never Used  Substance and Sexual Activity  . Alcohol use: No  . Drug use: No  . Sexual activity: Not on file  Lifestyle  . Physical activity:    Days per week: Not on file    Minutes per session: Not on file  . Stress: Not on file  Relationships  . Social connections:    Talks on phone: Not on file    Gets together: Not on file    Attends religious service: Not on file    Active member of club or organization: Not on file    Attends meetings of clubs or organizations: Not on file    Relationship status: Not on file  . Intimate partner violence:    Fear of current or ex partner: Not  on file    Emotionally abused: Not on file    Physically abused: Not on file    Forced sexual activity: Not on file  Other Topics Concern  . Not on file  Social History Narrative   Lives at home w/ her husband   Left-handed   Caffeine: tea once a day    Functional Status Survey:    Family History  Problem Relation Age of Onset  . Depression Mother   . Diabetes Father   . Heart attack Father   . Dementia Sister   . Pneumonia Brother   . Breast cancer Daughter     Health Maintenance  Topic Date Due  . DEXA SCAN  06/22/1999  . INFLUENZA VACCINE  07/15/2018  . TETANUS/TDAP  06/07/2020  . PNA vac Low Risk Adult  Completed    Allergies  Allergen Reactions  . Aricept [Donepezil Hcl]     Muscle cramps  . Latex Itching  . Septra [Sulfamethoxazole-Trimethoprim]   . Tramadol Other (See Comments)    Pt can't remember what side effects she had, she doesn't take it now    Outpatient Encounter Medications as of 05/17/2018  Medication Sig  . ASPIRIN 81 PO Take 1 tablet by mouth daily.  Marland Kitchen CALCIUM-VITAMIN D PO Take 1 tablet by mouth daily. 600mg  calcium, 800 units Vit D  . Melatonin 3 MG TABS Take 1 tablet (3 mg total) by mouth at bedtime as needed.  . memantine (NAMENDA) 10 MG tablet TAKE 1 TABLET BY MOUTH TWICE DAILY.  . mirtazapine (REMERON) 30 MG tablet Take 30 mg by mouth at bedtime.   . Multiple Vitamins-Minerals (CENTRUM SILVER PO) Take 1 tablet by mouth daily.  . Multiple Vitamins-Minerals (PRESERVISION AREDS PO) Take 2 capsules by mouth daily.   . sertraline (ZOLOFT) 25 MG tablet Take 50 mg by mouth daily.   . vitamin B-12 (CYANOCOBALAMIN) 1000 MCG tablet Take 1,000 mcg by mouth daily.  Marland Kitchen triamcinolone cream (KENALOG) 0.1 % Apply 1 application topically as needed.   . [DISCONTINUED] loratadine (CLARITIN) 10 MG tablet Take 1 tablet (10 mg total) by mouth daily. (Patient not taking: Reported on 04/21/2018)  . [DISCONTINUED] VAYACOG 100-19.5-6.5 MG CAPS TAKE (1) CAPSULE DAILY.  (Patient not taking: Reported on 05/17/2018)   No facility-administered encounter medications on file as of 05/17/2018.     Review of Systems  Constitutional: Negative for appetite change, chills, fatigue and fever.  HENT: Negative for congestion, ear discharge, ear pain, mouth sores, rhinorrhea, sinus pressure, sinus pain, sore throat and trouble swallowing.   Eyes: Positive for visual disturbance.  Respiratory: Negative for cough  and shortness of breath.   Cardiovascular: Negative for chest pain and palpitations.  Gastrointestinal: Negative for abdominal pain, constipation, diarrhea, nausea and vomiting.  Genitourinary: Negative for dysuria, frequency, hematuria, pelvic pain, vaginal bleeding, vaginal discharge and vaginal pain.       Has some burning with urination  Musculoskeletal: Negative for arthralgias and back pain.       No fall reported  Skin: Negative for rash.  Neurological: Negative for dizziness and headaches.  Psychiatric/Behavioral: Positive for decreased concentration and dysphoric mood. Negative for self-injury and suicidal ideas.    Vitals:   05/17/18 1611  BP: 114/62  Pulse: 80  Resp: 18  Temp: 98.2 F (36.8 C)  TempSrc: Oral  SpO2: 95%  Weight: 132 lb 6.4 oz (60.1 kg)  Height: 5\' 4"  (1.025 m)   Body mass index is 22.73 kg/m.   Wt Readings from Last 3 Encounters:  05/17/18 132 lb 6.4 oz (60.1 kg)  04/21/18 131 lb (59.4 kg)  02/15/18 132 lb (59.9 kg)   Physical Exam  Constitutional: She is oriented to person, place, and time. She appears well-developed and well-nourished. No distress.  HENT:  Head: Normocephalic and atraumatic.  Right Ear: External ear normal.  Left Ear: External ear normal.  Nose: Nose normal.  Mouth/Throat: Oropharynx is clear and moist. No oropharyngeal exudate.  No temporal artery tenderness, no TMJ tenderness  Eyes: Pupils are equal, round, and reactive to light. Conjunctivae and EOM are normal. Right eye exhibits no discharge.  Left eye exhibits no discharge.  Neck: Normal range of motion. Neck supple.  Cardiovascular: Normal rate, regular rhythm and intact distal pulses.  Pulmonary/Chest: Effort normal and breath sounds normal. She has no wheezes. She has no rales.  Abdominal: Soft. Bowel sounds are normal. There is no tenderness.  Musculoskeletal: Normal range of motion. She exhibits no edema.  Lymphadenopathy:    She has no cervical adenopathy.  Neurological: She is alert and oriented to person, place, and time.  Skin: Skin is warm and dry. She is not diaphoretic.  Psychiatric: She has a normal mood and affect.    Labs reviewed: Basic Metabolic Panel: No results for input(s): NA, K, CL, CO2, GLUCOSE, BUN, CREATININE, CALCIUM, MG, PHOS in the last 8760 hours. Liver Function Tests: No results for input(s): AST, ALT, ALKPHOS, BILITOT, PROT, ALBUMIN in the last 8760 hours. No results for input(s): LIPASE, AMYLASE in the last 8760 hours. No results for input(s): AMMONIA in the last 8760 hours. CBC: No results for input(s): WBC, NEUTROABS, HGB, HCT, MCV, PLT in the last 8760 hours. Cardiac Enzymes: No results for input(s): CKTOTAL, CKMB, CKMBINDEX, TROPONINI in the last 8760 hours. BNP: Invalid input(s): POCBNP No results found for: HGBA1C Lab Results  Component Value Date   TSH 3.520 11/16/2014   Lab Results  Component Value Date   ENIDPOEU23 536 10/30/2016   No results found for: FOLATE No results found for: IRON, TIBC, FERRITIN  Lipid Panel: No results for input(s): CHOL, HDL, LDLCALC, TRIG, CHOLHDL, LDLDIRECT in the last 8760 hours. No results found for: HGBA1C  Procedures since last visit: No results found.  Assessment/Plan  1. B12 deficiency Continue b12 supplement - CBC (no diff); Future - Lipid Panel; Future - Vitamin B12; Future  2. Major depression, recurrent, chronic (HCC) Continue remeron and zoloft - Lipid Panel; Future  3. Adjustment insomnia Continue melatonin, sleep  hygiene reinforced  4. Mild memory disturbance F/b neurology. Continue memantine. Continue fish oil - TSH; Future - Vitamin B12; Future  5. History of hyperlipidemia - Lipid Panel; Future  6. Burning with urination No increased urinary frequency or urgency, no fever, appetite good, benign exam. Occasional burning with urination reported. Also dark colored urine. Patient mentions not keeping herself hydrated. Encouraged hydration for now. Cranberry juice as well. If no improvement, reassess.  7. Jaw clicking For few days. Has not seen her dentist in a while. No signs of infection on exam. No problem closing and opening her mouth. Pain free. No headache, jaw or neck pain. advised her to be seen by her dentist. If has trouble opening/ closing her mouth, chewing or develops pain, to notify.     Labs/tests ordered:   Lab Orders     CBC (no diff)     Lipid Panel     TSH     Vitamin B12  Next appointment: 4 months  Communication: reviewed care plan with patient and her daughter. Have reviewed ophthalmology and psychiatry notes.     Blanchie Serve, MD Internal Medicine [facility] [address] [place], [place] [address] Cell Phone (Monday-Friday 8 am - 5 pm): [phone] On Call: [phone] and follow prompts after 5 pm and on weekends Office Phone: [phone] Office Fax: [phone]

## 2018-05-18 ENCOUNTER — Other Ambulatory Visit: Payer: Self-pay

## 2018-05-18 ENCOUNTER — Encounter: Payer: Self-pay | Admitting: Internal Medicine

## 2018-05-18 DIAGNOSIS — E538 Deficiency of other specified B group vitamins: Secondary | ICD-10-CM

## 2018-05-18 DIAGNOSIS — R413 Other amnesia: Secondary | ICD-10-CM

## 2018-05-20 ENCOUNTER — Encounter: Payer: Self-pay | Admitting: Neurology

## 2018-05-21 ENCOUNTER — Encounter: Payer: Self-pay | Admitting: Neurology

## 2018-05-21 ENCOUNTER — Ambulatory Visit (INDEPENDENT_AMBULATORY_CARE_PROVIDER_SITE_OTHER): Payer: Medicare Other | Admitting: Neurology

## 2018-05-21 VITALS — BP 122/72 | HR 74 | Ht 64.0 in | Wt 132.5 lb

## 2018-05-21 DIAGNOSIS — R413 Other amnesia: Secondary | ICD-10-CM | POA: Diagnosis not present

## 2018-05-21 DIAGNOSIS — R29898 Other symptoms and signs involving the musculoskeletal system: Secondary | ICD-10-CM

## 2018-05-21 DIAGNOSIS — F4321 Adjustment disorder with depressed mood: Secondary | ICD-10-CM | POA: Diagnosis not present

## 2018-05-21 MED ORDER — AXONA PO PACK: 40.0000 g | PACK | Freq: Every day | ORAL | 3 refills | Status: DC

## 2018-05-21 NOTE — Progress Notes (Signed)
Reason for visit: Memory disturbance, dropped head syndrome  Janet [name] is an 84 y.o. female  History of present illness:  Janet [name] is an 84 year old left-handed white female with a history of a dropped head syndrome.  The patient continues to have some issues in this regard, she will be seeing a chiropractor in the near future to continue exercises to help extend her neck.  The patient has to think about keeping her head up.  She does have a neck brace but she rarely uses it.  The patient also has some mild memory problems that have progressed slightly since last seen, the patient comes in today with her daughter.  The daughter does the finances, the patient is able to keep up with her own medications and appointments.  She lives at [facility].  Her husband passed away recently.  The patient takes melatonin, mirtazapine, and Sleepy Time tea in the evenings to help her rest better.  The patient tends to go to bed rather late, around 10 or 11 the evening and she gets up around 7 AM.  The patient has a good energy level throughout the day.  She does have some mild gait instability, she uses a cane for ambulation.  Past Medical History:  Diagnosis Date  . BPV (benign positional vertigo)   . Cancer [facility])    uterine cancer  . Carpal tunnel syndrome on left 06/02/2017  . Cognitive changes   . Depression   . Droopy eyelid, right   . 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 Dyslipidemia   . Fibromyalgia   . Granuloma annulare   . History of uterine cancer 2003   treated with hysterectomy  . Lumbar radicular syndrome    left L5  . Macular degeneration    left eye  . Sciatica   . TIA (transient ischemic attack)     Past Surgical History:  Procedure Laterality Date  . ABDOMINAL HYSTERECTOMY    . BREAST BIOPSY  2007  . CATARACT EXTRACTION    . TONSILLECTOMY      Family History  Problem Relation Age of Onset  . Depression Mother   . Diabetes Father   . Heart attack Father   .  Dementia Sister   . Pneumonia Brother   . Breast cancer Daughter     Social history:  reports that she has never smoked. She has never used smokeless tobacco. She reports that she does not drink alcohol or use drugs.    Allergies  Allergen Reactions  . Aricept [Donepezil Hcl]     Muscle cramps  . Latex Itching  . Septra [Sulfamethoxazole-Trimethoprim]   . Tramadol Other (See Comments)    Pt can't remember what side effects she had, she doesn't take it now    Medications:  Prior to Admission medications   Medication Sig Start Date End Date Taking? Authorizing Provider  ASPIRIN 81 PO Take 1 tablet by mouth daily.   Yes [provider]  CALCIUM-VITAMIN D PO Take 1 tablet by mouth daily. 600mg  calcium, 800 units Vit D   Yes [provider]  Melatonin 3 MG TABS Take 1 tablet (3 mg total) by mouth at bedtime as needed. 04/21/18 05/21/18 Yes Pandey, Mahima, MD  memantine (NAMENDA) 10 MG tablet TAKE 1 TABLET BY MOUTH TWICE DAILY. 01/26/18  Yes Kathrynn Ducking, MD  mirtazapine (REMERON) 30 MG tablet Take 30 mg by mouth at bedtime.  10/10/16  Yes [provider]  Multiple  Vitamins-Minerals (CENTRUM SILVER PO) Take 1 tablet by mouth daily.   Yes [provider]  Multiple Vitamins-Minerals (PRESERVISION AREDS PO) Take 2 capsules by mouth daily.    Yes [provider]  Omega-3 Fatty Acids (FISH OIL PO) Take by mouth.   Yes [provider]  sertraline (ZOLOFT) 25 MG tablet Take 50 mg by mouth daily.  10/10/16  Yes [provider]  triamcinolone cream (KENALOG) 0.1 % Apply 1 application topically as needed.    Yes [provider]  vitamin B-12 (CYANOCOBALAMIN) 1000 MCG tablet Take 1,000 mcg by mouth daily.   Yes [provider]    ROS:  Out of a complete 14 system review of symptoms, the patient complains only of the following symptoms, and all other reviewed systems are negative.  Shortness of breath Painful  urination, urinary urgency Joint pain, neck stiffness Memory loss Confusion, depression  Blood pressure 122/72, pulse 74, height 5\' 4"  (1.626 m), weight 132 lb 8 oz (60.1 kg).  Physical Exam  General: The patient is alert and cooperative at the time of the examination.  Skin: No significant peripheral edema is noted.   Neurologic Exam  Mental status: The patient is alert and oriented x 3 at the time of the examination. The Mini-Mental status examination done today shows a total score 26/30.   Cranial nerves: Facial symmetry is present. Speech is normal, no aphasia or dysarthria is noted. Extraocular movements are full. Visual fields are full.  Motor: The patient has good strength in all 4 extremities.   Sensory examination: Soft touch sensation is symmetric on the face, arms, and legs.  Coordination: The patient has good finger-nose-finger and heel-to-shin bilaterally.  Gait and station: The patient has a slightly wide-based gait, the patient can walk independently, she usually uses a cane. Romberg is negative. No drift is seen.  Reflexes: Deep tendon reflexes are symmetric.   Assessment/Plan:  1.  Mild memory disturbance  2.  Dropped head syndrome  The patient is on Namenda, she will continue this for the memory.  The [place] Nan is back ordered, she will use Axona now until the [place] can be obtained.  The patient will be getting chiropractic treatments and possibly acupuncture in the near future.  Hopefully this will help her dropped head syndrome.  The patient will follow-up in 6 months.  Jill Alexanders MD 05/21/2018 11:25 AM  [facility] [address] [place], [place] [address]  Phone [phone] Fax [phone]

## 2018-05-21 NOTE — Patient Instructions (Signed)
   We will start [place] as a substitute for the Vayacog.

## 2018-05-31 DIAGNOSIS — R413 Other amnesia: Secondary | ICD-10-CM

## 2018-05-31 DIAGNOSIS — E538 Deficiency of other specified B group vitamins: Secondary | ICD-10-CM | POA: Diagnosis not present

## 2018-05-31 DIAGNOSIS — F339 Major depressive disorder, recurrent, unspecified: Secondary | ICD-10-CM

## 2018-05-31 DIAGNOSIS — E785 Hyperlipidemia, unspecified: Secondary | ICD-10-CM | POA: Diagnosis not present

## 2018-05-31 DIAGNOSIS — Z8639 Personal history of other endocrine, nutritional and metabolic disease: Secondary | ICD-10-CM | POA: Diagnosis not present

## 2018-05-31 LAB — COMPLETE METABOLIC PANEL WITH GFR
AG RATIO: 1.7 (calc) (ref 1.0–2.5)
ALKALINE PHOSPHATASE (APISO): 45 U/L (ref 33–130)
ALT: 14 U/L (ref 6–29)
AST: 18 U/L (ref 10–35)
Albumin: 4.3 g/dL (ref 3.6–5.1)
BILIRUBIN TOTAL: 0.6 mg/dL (ref 0.2–1.2)
BUN/Creatinine Ratio: 24 (calc) — ABNORMAL HIGH (ref 6–22)
BUN: 23 mg/dL (ref 7–25)
CHLORIDE: 99 mmol/L (ref 98–110)
CO2: 26 mmol/L (ref 20–32)
Calcium: 9.3 mg/dL (ref 8.6–10.4)
Creat: 0.95 mg/dL — ABNORMAL HIGH (ref 0.60–0.88)
GFR, Est African American: 64 mL/min/{1.73_m2} (ref >=60)
GFR, Est Non African American: 55 mL/min/{1.73_m2} — ABNORMAL LOW (ref >=60)
GLOBULIN: 2.5 g/dL (ref 1.9–3.7)
Glucose, Bld: 101 mg/dL — ABNORMAL HIGH (ref 65–99)
POTASSIUM: 4.2 mmol/L (ref 3.5–5.3)
SODIUM: 135 mmol/L (ref 135–146)
Total Protein: 6.8 g/dL (ref 6.1–8.1)

## 2018-05-31 LAB — TSH: TSH: 3.78 m[IU]/L (ref 0.40–4.50)

## 2018-05-31 LAB — LIPID PANEL
CHOLESTEROL: 207 mg/dL — AB (ref <200)
HDL: 45 mg/dL — ABNORMAL LOW (ref >50)
LDL CHOLESTEROL (CALC): 148 mg/dL — AB
Non-HDL Cholesterol (Calc): 162 mg/dL (calc) — ABNORMAL HIGH (ref <130)
Total CHOL/HDL Ratio: 4.6 (calc) (ref <5.0)
Triglycerides: 52 mg/dL (ref <150)

## 2018-05-31 LAB — CBC
HEMATOCRIT: 36.7 % (ref 35.0–45.0)
Hemoglobin: 13.1 g/dL (ref 11.7–15.5)
MCH: 32.3 pg (ref 27.0–33.0)
MCHC: 35.7 g/dL (ref 32.0–36.0)
MCV: 90.6 fL (ref 80.0–100.0)
MPV: 8.7 fL (ref 7.5–12.5)
Platelets: 244 10*3/uL (ref 140–400)
RBC: 4.05 10*6/uL (ref 3.80–5.10)
RDW: 12.6 % (ref 11.0–15.0)
WBC: 5.4 10*3/uL (ref 3.8–10.8)

## 2018-05-31 LAB — VITAMIN B12: VITAMIN B 12: 1125 pg/mL — AB (ref 200–1100)

## 2018-06-10 DIAGNOSIS — M50323 Other cervical disc degeneration at C6-C7 level: Secondary | ICD-10-CM | POA: Diagnosis not present

## 2018-06-10 DIAGNOSIS — M9901 Segmental and somatic dysfunction of cervical region: Secondary | ICD-10-CM | POA: Diagnosis not present

## 2018-06-10 DIAGNOSIS — M9902 Segmental and somatic dysfunction of thoracic region: Secondary | ICD-10-CM | POA: Diagnosis not present

## 2018-06-10 DIAGNOSIS — M542 Cervicalgia: Secondary | ICD-10-CM | POA: Diagnosis not present

## 2018-06-10 DIAGNOSIS — M546 Pain in thoracic spine: Secondary | ICD-10-CM | POA: Diagnosis not present

## 2018-06-10 DIAGNOSIS — M9905 Segmental and somatic dysfunction of pelvic region: Secondary | ICD-10-CM | POA: Diagnosis not present

## 2018-06-28 DIAGNOSIS — H4322 Crystalline deposits in vitreous body, left eye: Secondary | ICD-10-CM | POA: Diagnosis not present

## 2018-06-28 DIAGNOSIS — Z961 Presence of intraocular lens: Secondary | ICD-10-CM | POA: Diagnosis not present

## 2018-06-28 DIAGNOSIS — H5213 Myopia, bilateral: Secondary | ICD-10-CM | POA: Diagnosis not present

## 2018-06-28 DIAGNOSIS — H02834 Dermatochalasis of left upper eyelid: Secondary | ICD-10-CM | POA: Diagnosis not present

## 2018-07-05 DIAGNOSIS — M9901 Segmental and somatic dysfunction of cervical region: Secondary | ICD-10-CM | POA: Diagnosis not present

## 2018-07-05 DIAGNOSIS — M542 Cervicalgia: Secondary | ICD-10-CM | POA: Diagnosis not present

## 2018-07-05 DIAGNOSIS — M546 Pain in thoracic spine: Secondary | ICD-10-CM | POA: Diagnosis not present

## 2018-07-05 DIAGNOSIS — M50323 Other cervical disc degeneration at C6-C7 level: Secondary | ICD-10-CM | POA: Diagnosis not present

## 2018-07-05 DIAGNOSIS — M9902 Segmental and somatic dysfunction of thoracic region: Secondary | ICD-10-CM | POA: Diagnosis not present

## 2018-07-19 ENCOUNTER — Encounter: Payer: Medicare Other | Admitting: Internal Medicine

## 2018-07-28 ENCOUNTER — Encounter: Payer: Self-pay | Admitting: Internal Medicine

## 2018-07-28 DIAGNOSIS — M50323 Other cervical disc degeneration at C6-C7 level: Secondary | ICD-10-CM | POA: Diagnosis not present

## 2018-07-28 DIAGNOSIS — M542 Cervicalgia: Secondary | ICD-10-CM | POA: Diagnosis not present

## 2018-07-28 DIAGNOSIS — M9901 Segmental and somatic dysfunction of cervical region: Secondary | ICD-10-CM | POA: Diagnosis not present

## 2018-07-28 DIAGNOSIS — M9902 Segmental and somatic dysfunction of thoracic region: Secondary | ICD-10-CM | POA: Diagnosis not present

## 2018-07-28 DIAGNOSIS — M546 Pain in thoracic spine: Secondary | ICD-10-CM | POA: Diagnosis not present

## 2018-07-31 ENCOUNTER — Encounter: Payer: Self-pay | Admitting: Internal Medicine

## 2018-08-02 ENCOUNTER — Encounter: Payer: Self-pay | Admitting: Internal Medicine

## 2018-08-02 ENCOUNTER — Non-Acute Institutional Stay: Payer: Medicare Other | Admitting: Internal Medicine

## 2018-08-02 VITALS — BP 114/60 | HR 80 | Temp 98.0°F | Resp 18 | Ht 63.0 in | Wt 129.6 lb

## 2018-08-02 DIAGNOSIS — R195 Other fecal abnormalities: Secondary | ICD-10-CM | POA: Diagnosis not present

## 2018-08-02 NOTE — Patient Instructions (Signed)
  I want your family member and/or caregiver to take a look at your stool after your bowel movement. If you notice black colored stool or frank blood or mucus in your stool give my office a call. You have soft stool 1 to 2 bowel movement a day without any other associated symptom. I would monitor for now. I encourage you to keep yourself hydrated and take banana a day if possible.

## 2018-08-02 NOTE — Progress Notes (Signed)
Janet [name]  Provider: Blanchie Serve MD   Location:  [place] of Service:  [name] (12)  PCP: Blanchie Serve, MD Patient Care Team: Blanchie Serve, MD as PCP - General (Internal Medicine) [name], MD as Referring Physician (Physical Medicine and Rehabilitation) Ngetich, Nelda Bucks, NP as Nurse Practitioner (Family Medicine)  Extended Emergency Contact Information Primary Emergency Contact: [name], [name] of [place] Phone: [phone] Relation: Daughter Secondary Emergency Contact: Myers,Lois  [place] Phone: [phone] Relation: Daughter   Goals of Care: Advanced Directive information Advanced Directives 05/17/2018  Does Patient Have a Medical Advance Directive? Yes  Type of Paramedic of [place];Living will  Does patient want to make changes to medical advance directive? No - Patient declined  Copy of [place] in Chart? Yes     Chief Complaint  Patient presents with  . Acute Visit    lost stools for 2 weeks  . Medication Refill    No refills needed at this time    HPI: [name] seen today for acute visit. She is here with her caregiver with complaints of soft stool for 2 weeks. She denies loose stool or watery stool. No change in diet and medication in last few weeks. denies blood in stool. Has soft stool 1-2 times a day. Denies preceding cramp or abdominal pain. Denies nausea or vomiting. Appetite is fair. She mentions she has seen insects move in her stool. Family member have not seen her stool. No recent travel reported. Pt resides in Independent living and mainly eats in the dining area.   Past Medical History:  Diagnosis Date  . BPV (benign positional vertigo)   . Cancer [facility])    uterine cancer  . Carpal tunnel syndrome on left 06/02/2017  . Cognitive changes   . Depression   . Droopy eyelid,  right   . 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 Dyslipidemia   . Fibromyalgia   . Granuloma annulare   . History of uterine cancer 2003   treated with hysterectomy  . Lumbar radicular syndrome    left L5  . Macular degeneration    left eye  . Sciatica   . TIA (transient ischemic attack)    Past Surgical History:  Procedure Laterality Date  . ABDOMINAL HYSTERECTOMY    . BREAST BIOPSY  2007  . CATARACT EXTRACTION    . TONSILLECTOMY      reports that she has never smoked. She has never used smokeless tobacco. She reports that she does not drink alcohol or use drugs. Social History   Socioeconomic History  . Marital status: Married    Spouse name: Not on file  . Number of children: Not on file  . Years of education: 46  . Highest education level: Not on file  Occupational History  . Occupation: Retired Warden/ranger  Social Needs  . Financial resource strain: Not on file  . Food insecurity:    Worry: Not on file    Inability: Not on file  . Transportation needs:    Medical: Not on file    Non-medical: Not on file  Tobacco Use  . Smoking status: Never Smoker  . Smokeless tobacco: Never Used  Substance and Sexual Activity  . Alcohol use: No  . Drug use: No  . Sexual activity: Not on file  Lifestyle  . Physical activity:    Days per week: Not on file    Minutes per session: Not on file  . Stress: Not on file  Relationships  . Social connections:    Talks on phone: Not on file    Gets together: Not on file    Attends religious service: Not on file    Active member of club or organization: Not on file    Attends meetings of clubs or organizations: Not on file    Relationship status: Not on file  . Intimate partner violence:    Fear of current or ex partner: Not on file    Emotionally abused: Not on file    Physically abused: Not on file    Forced sexual activity: Not on file  Other Topics Concern  . Not on file  Social History Narrative   Lives at home  w/ her husband   Left-handed   Caffeine: tea once a day     Family History  Problem Relation Age of Onset  . Depression Mother   . Diabetes Father   . Heart attack Father   . Dementia Sister   . Pneumonia Brother   . Breast cancer Daughter     Health Maintenance  Topic Date Due  . DEXA SCAN  06/22/1999  . INFLUENZA VACCINE  07/15/2018  . TETANUS/TDAP  06/07/2020  . PNA vac Low Risk Adult  Completed    Allergies  Allergen Reactions  . Aricept [Donepezil Hcl]     Muscle cramps  . Latex Itching  . Septra [Sulfamethoxazole-Trimethoprim]   . Tramadol Other (See Comments)    Pt can't remember what side effects she had, she doesn't take it now    Outpatient Encounter Medications as of 08/02/2018  Medication Sig  . ASPIRIN 81 PO Take 1 tablet by mouth daily.  Marland Kitchen CALCIUM-VITAMIN D PO Take 1 tablet by mouth daily. 600mg  calcium, 800 units Vit D  . memantine (NAMENDA) 10 MG tablet TAKE 1 TABLET BY MOUTH TWICE DAILY.  . mirtazapine (REMERON) 30 MG tablet Take 30 mg by mouth at bedtime.   . Multiple Vitamins-Minerals (CENTRUM SILVER PO) Take 1 tablet by mouth daily.  . Multiple Vitamins-Minerals (PRESERVISION AREDS PO) Take 2 capsules by mouth daily.   . Omega-3 Fatty Acids (FISH OIL PO) Take by mouth.  . sertraline (ZOLOFT) 25 MG tablet Take 50 mg by mouth daily.   Marland Kitchen triamcinolone cream (KENALOG) 0.1 % Apply 1 application topically as needed.   . vitamin B-12 (CYANOCOBALAMIN) 1000 MCG tablet Take 1,000 mcg by mouth daily.  . Dietary Management Product (AXONA) packet Take 40 g by mouth daily. (Patient not taking: Reported on 08/02/2018)   No facility-administered encounter medications on file as of 08/02/2018.     Review of Systems  Constitutional: Negative for chills, fever and unexpected weight change.  HENT: Negative for congestion.   Respiratory: Negative for shortness of breath.   Cardiovascular: Negative for chest pain.  Gastrointestinal: Negative for abdominal  distention, abdominal pain, anal bleeding, blood in stool, constipation, diarrhea, nausea, rectal pain and vomiting.  Genitourinary: Negative for dysuria and hematuria.  Neurological: Negative for dizziness and headaches.  Psychiatric/Behavioral: Positive for decreased concentration and sleep disturbance.    Vitals:   08/02/18 1452  BP: 114/60  Pulse: 80  Resp: 18  Temp: 98 F (36.7 C)  TempSrc: Oral  SpO2: 99%  Weight: 129 lb 9.6 oz (58.8 kg)  Height: 5\' 3"  (1.6 m)  Body mass index is 22.96 kg/m. Physical Exam  Constitutional: She is oriented to person, place, and time. She appears well-developed and well-nourished. No distress.  HENT:  Head: Normocephalic and atraumatic.  Mouth/Throat: Oropharynx is clear and moist.  Eyes: Pupils are equal, round, and reactive to light. Conjunctivae and EOM are normal. Right eye exhibits no discharge. Left eye exhibits no discharge.  Neck: Normal range of motion. Neck supple.  Cardiovascular: Normal rate and regular rhythm.  Pulmonary/Chest: Effort normal and breath sounds normal. She has no wheezes. She has no rales.  Abdominal: Soft. Bowel sounds are normal. She exhibits no distension and no mass. There is no tenderness. There is no rebound and no guarding.  Musculoskeletal: Normal range of motion.  Lymphadenopathy:    She has no cervical adenopathy.  Neurological: She is alert and oriented to person, place, and time. She exhibits normal muscle tone.  Skin: Skin is warm and dry. She is not diaphoretic.    Labs reviewed: Basic Metabolic Panel: Recent Labs    05/31/18 0800  NA 135  K 4.2  CL 99  CO2 26  GLUCOSE 101*  BUN 23  CREATININE 0.95*  CALCIUM 9.3   Liver Function Tests: Recent Labs    05/31/18 0800  AST 18  ALT 14  BILITOT 0.6  PROT 6.8   No results for input(s): LIPASE, AMYLASE in the last 8760 hours. No results for input(s): AMMONIA in the last 8760 hours. CBC: Recent Labs    05/31/18 0800  WBC 5.4  HGB  13.1  HCT 36.7  MCV 90.6  PLT 244   Cardiac Enzymes: No results for input(s): CKTOTAL, CKMB, CKMBINDEX, TROPONINI in the last 8760 hours. BNP: Invalid input(s): POCBNP No results found for: HGBA1C Lab Results  Component Value Date   TSH 3.78 05/31/2018   Lab Results  Component Value Date   VITAMINB12 1,125 (H) 05/31/2018   No results found for: FOLATE No results found for: IRON, TIBC, FERRITIN  Lipid Panel: Recent Labs    05/31/18 0800  CHOL 207*  HDL 45*  LDLCALC 148*  TRIG 52  CHOLHDL 4.6   No results found for: HGBA1C  Procedures since last visit: No results found.  Assessment/Plan  1. Change in consistency of stool Soft stool one to two episode a day for 2 weeks. No new dietary or medication change. Unclear about ? Insect/ worm in stool. Have advised for daughter and or caregiver to look into her stool next time and report if any worms or insect is noted. She is on multiple mood medication out of which sertraline and namenda can contribute to loose stool or diarrhea but she has been on these medication for some time. Encouraged hydration and BRAT diet for now. Monitor clinically.     Labs/tests ordered:  none  Next appointment: has follow up  Communication: reviewed care plan with patient and her caregiver    Blanchie Serve, MD Internal Medicine [place], [place] [address] Cell Phone (Monday-Friday 8 am - 5 pm): [phone] On Call: [phone] and follow prompts after 5 pm and on weekends Office Phone: [phone] Office Fax: [phone]

## 2018-08-04 ENCOUNTER — Other Ambulatory Visit: Payer: Self-pay | Admitting: Neurology

## 2018-08-23 ENCOUNTER — Telehealth: Payer: Self-pay | Admitting: Internal Medicine

## 2018-08-23 DIAGNOSIS — M9902 Segmental and somatic dysfunction of thoracic region: Secondary | ICD-10-CM | POA: Diagnosis not present

## 2018-08-23 DIAGNOSIS — M26602 Left temporomandibular joint disorder, unspecified: Secondary | ICD-10-CM | POA: Diagnosis not present

## 2018-08-23 DIAGNOSIS — M50323 Other cervical disc degeneration at C6-C7 level: Secondary | ICD-10-CM | POA: Diagnosis not present

## 2018-08-23 DIAGNOSIS — M542 Cervicalgia: Secondary | ICD-10-CM | POA: Diagnosis not present

## 2018-08-23 DIAGNOSIS — M9901 Segmental and somatic dysfunction of cervical region: Secondary | ICD-10-CM | POA: Diagnosis not present

## 2018-08-23 DIAGNOSIS — M546 Pain in thoracic spine: Secondary | ICD-10-CM | POA: Diagnosis not present

## 2018-08-23 DIAGNOSIS — M9907 Segmental and somatic dysfunction of upper extremity: Secondary | ICD-10-CM | POA: Diagnosis not present

## 2018-08-23 NOTE — Telephone Encounter (Signed)
I spoke with the patient's daughter, [name], about scheduling AWV.  She said that she doesn't feel as if it's needed right now because she recently established with Dr. Bubba Camp, and a lot of that was covered. VDM (DD)

## 2018-08-27 ENCOUNTER — Other Ambulatory Visit: Payer: Self-pay | Admitting: *Deleted

## 2018-08-27 DIAGNOSIS — E538 Deficiency of other specified B group vitamins: Secondary | ICD-10-CM

## 2018-08-27 DIAGNOSIS — Z8639 Personal history of other endocrine, nutritional and metabolic disease: Secondary | ICD-10-CM

## 2018-08-27 DIAGNOSIS — E785 Hyperlipidemia, unspecified: Secondary | ICD-10-CM

## 2018-08-27 DIAGNOSIS — R413 Other amnesia: Secondary | ICD-10-CM

## 2018-08-30 DIAGNOSIS — E538 Deficiency of other specified B group vitamins: Secondary | ICD-10-CM

## 2018-08-30 DIAGNOSIS — R413 Other amnesia: Secondary | ICD-10-CM

## 2018-08-30 DIAGNOSIS — E785 Hyperlipidemia, unspecified: Secondary | ICD-10-CM

## 2018-08-31 LAB — CBC WITH DIFFERENTIAL/PLATELET
BASOS PCT: 0.5 %
Basophils Absolute: 33 cells/uL (ref 0–200)
EOS PCT: 2.3 %
Eosinophils Absolute: 152 cells/uL (ref 15–500)
HCT: 39.6 % (ref 35.0–45.0)
Hemoglobin: 13.7 g/dL (ref 11.7–15.5)
Lymphs Abs: 1901 cells/uL (ref 850–3900)
MCH: 32 pg (ref 27.0–33.0)
MCHC: 34.6 g/dL (ref 32.0–36.0)
MCV: 92.5 fL (ref 80.0–100.0)
MONOS PCT: 8.4 %
MPV: 8.8 fL (ref 7.5–12.5)
NEUTROS PCT: 60 %
Neutro Abs: 3960 cells/uL (ref 1500–7800)
PLATELETS: 266 10*3/uL (ref 140–400)
RBC: 4.28 10*6/uL (ref 3.80–5.10)
RDW: 12.6 % (ref 11.0–15.0)
TOTAL LYMPHOCYTE: 28.8 %
WBC: 6.6 10*3/uL (ref 3.8–10.8)
WBCMIX: 554 {cells}/uL (ref 200–950)

## 2018-08-31 LAB — LIPID PANEL
CHOL/HDL RATIO: 5.1 (calc) — AB (ref <5.0)
Cholesterol: 228 mg/dL — ABNORMAL HIGH (ref <200)
HDL: 45 mg/dL — ABNORMAL LOW (ref >50)
LDL CHOLESTEROL (CALC): 165 mg/dL — AB
NON-HDL CHOLESTEROL (CALC): 183 mg/dL — AB (ref <130)
Triglycerides: 74 mg/dL (ref <150)

## 2018-08-31 LAB — VITAMIN B12: VITAMIN B 12: 1020 pg/mL (ref 200–1100)

## 2018-08-31 LAB — TSH: TSH: 3.21 m[IU]/L (ref 0.40–4.50)

## 2018-09-07 ENCOUNTER — Encounter: Payer: Medicare Other | Admitting: Family

## 2018-09-16 DIAGNOSIS — Z23 Encounter for immunization: Secondary | ICD-10-CM | POA: Diagnosis not present

## 2018-09-20 ENCOUNTER — Encounter: Payer: Self-pay | Admitting: Internal Medicine

## 2018-09-21 ENCOUNTER — Encounter: Payer: Self-pay | Admitting: Family

## 2018-09-21 ENCOUNTER — Non-Acute Institutional Stay: Payer: Medicare Other | Admitting: Family

## 2018-09-21 VITALS — BP 112/52 | HR 84 | Temp 98.1°F | Ht 63.0 in | Wt 127.4 lb

## 2018-09-21 DIAGNOSIS — F329 Major depressive disorder, single episode, unspecified: Secondary | ICD-10-CM

## 2018-09-21 DIAGNOSIS — E782 Mixed hyperlipidemia: Secondary | ICD-10-CM | POA: Diagnosis not present

## 2018-09-21 DIAGNOSIS — N183 Chronic kidney disease, stage 3 unspecified: Secondary | ICD-10-CM

## 2018-09-21 DIAGNOSIS — R413 Other amnesia: Secondary | ICD-10-CM

## 2018-09-21 NOTE — Patient Instructions (Signed)
1.Continue on low carbohydrates,low saturated fats,and high vegetables diet 2. Continue to participate in facility exercises 3. Follow up in 3 months.Please get fasting lab work drawn one week prior to visit

## 2018-09-21 NOTE — Progress Notes (Signed)
Location:  [place] of Service:  Clinic (12) Provider: Dinah Ngetich FNP-C   Blanchie Serve, MD  Patient Care Team: Blanchie Serve, MD as PCP - General (Internal Medicine) [name], MD as Referring Physician (Physical Medicine and Rehabilitation) Ngetich, Nelda Bucks, NP as Nurse Practitioner (Family Medicine)  Extended Emergency Contact Information Primary Emergency Contact: [name], [place] of [place] Phone: [phone] Relation: Daughter Secondary Emergency Contact: Myers,Lois  [place] Phone: [phone] Relation: Daughter  Goals of care: Advanced Directive information Advanced Directives 05/17/2018  Does Patient Have a Medical Advance Directive? Yes  Type of Paramedic of [place];Living will  Does patient want to make changes to medical advance directive? No - Patient declined  Copy of [place] in Chart? Yes     Chief Complaint  Patient presents with  . Medical Management of Chronic Issues    4 month follow up and discuss lab work. Patient is here today with her daughter [name]. Patient has no complaints today.     HPI:  Pt is a 84 y.o. female seen today [facility] for medical management of chronic diseases.she is escorted by her daughter lois today.she denies any acute issues.Her recent lab work results showed Total cholesterol 228,HDL 45,TRG 74,LDL 165 (08/30/2018).she states has participates in the facility exercises.Her meal intake seems to includes application of mayonnaise and eats eggs twice or less per week.she denies any chest pain or palpitation.she has had 3 lbs weight loss though has increased her participation in exercises and her walking exercises.No recent fall episode.      Past Medical History:  Diagnosis Date  . BPV (benign positional vertigo)   . Cancer [facility]    uterine cancer  . Carpal tunnel syndrome on left  06/02/2017  . Cognitive changes   . Depression   . Droopy eyelid, right   . 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 Dyslipidemia   . Fibromyalgia   . Granuloma annulare   . History of uterine cancer 2003   treated with hysterectomy  . Lumbar radicular syndrome    left L5  . Macular degeneration    left eye  . Sciatica   . TIA (transient ischemic attack)    Past Surgical History:  Procedure Laterality Date  . ABDOMINAL HYSTERECTOMY    . BREAST BIOPSY  2007  . CATARACT EXTRACTION    . TONSILLECTOMY      Allergies  Allergen Reactions  . Aricept [Donepezil Hcl]     Muscle cramps  . Latex Itching  . Septra [Sulfamethoxazole-Trimethoprim]   . Tramadol Other (See Comments)    Pt can't remember what side effects she had, she doesn't take it now    Allergies as of 09/21/2018      Reactions   Aricept [donepezil Hcl]    Muscle cramps   Latex Itching   Septra [sulfamethoxazole-trimethoprim]    Tramadol Other (See Comments)   Pt can't remember what side effects she had, she doesn't take it now      Medication List        Accurate as of 09/21/18  4:36 PM. Always use your most recent med list.          ASPIRIN 81 PO Take 1 tablet by mouth daily.   AXONA packet Take 40 g by mouth daily.   CALCIUM-VITAMIN D PO Take 1 tablet by mouth  daily. 600mg  calcium, 800 units Vit D   FISH OIL PO Take by mouth.   memantine 10 MG tablet Commonly known as:  NAMENDA TAKE 1 TABLET BY MOUTH TWICE DAILY.   mirtazapine 30 MG tablet Commonly known as:  REMERON Take 30 mg by mouth at bedtime.   CENTRUM SILVER PO Take 1 tablet by mouth daily.   PRESERVISION AREDS PO Take 2 capsules by mouth daily.   sertraline 25 MG tablet Commonly known as:  ZOLOFT Take 50 mg by mouth daily.   triamcinolone cream 0.1 % Commonly known as:  KENALOG Apply 1 application topically as needed.   vitamin B-12 1000 MCG tablet Commonly known as:  CYANOCOBALAMIN Take 1,000 mcg by mouth daily.        Review of Systems  Constitutional: Negative for appetite change, chills, fatigue and fever.  HENT: Negative for congestion, rhinorrhea, sinus pressure, sinus pain, sneezing and sore throat.   Eyes: Positive for visual disturbance. Negative for discharge, redness and itching.       Wears eye glasses   Respiratory: Negative for cough, chest tightness, shortness of breath and wheezing.   Cardiovascular: Negative for chest pain, palpitations and leg swelling.  Gastrointestinal: Negative for abdominal distention, abdominal pain, constipation, diarrhea, nausea and vomiting.  Endocrine: Negative for cold intolerance, heat intolerance, polydipsia, polyphagia and polyuria.  Genitourinary: Negative for dysuria, flank pain, frequency and urgency.  Musculoskeletal: Positive for gait problem.       Uses a cane for walking   Skin: Negative for color change, pallor, rash and wound.  Neurological: Negative for dizziness, weakness, light-headedness and headaches.  Hematological: Does not bruise/bleed easily.  Psychiatric/Behavioral: Negative for agitation and sleep disturbance. The patient is not nervous/anxious.     Immunization History  Administered Date(s) Administered  . Influenza, High Dose Seasonal PF 09/23/2017  . Influenza-Unspecified 09/14/2017   Pertinent  Health Maintenance Due  Topic Date Due  . DEXA SCAN  06/22/1999  . INFLUENZA VACCINE  07/15/2018  . PNA vac Low Risk Adult  Completed   Fall Risk  09/21/2018 05/01/2017  Falls in the past year? No Yes  Number falls in past yr: - 1  Injury with Fall? - No  Risk for fall due to : - Impaired balance/gait    Vitals:   09/21/18 1601  BP: (!) 112/52  Pulse: 84  Temp: 98.1 F (36.7 C)  SpO2: 95%  Weight: 127 lb 6.4 oz (57.8 kg)  Height: 5\' 3"  (1.6 m)   Body mass index is 22.57 kg/m. Physical Exam  Constitutional: She is oriented to person, place, and time. She appears well-developed and well-nourished. No distress.  HENT:   Head: Normocephalic.  Mouth/Throat: Oropharynx is clear and moist. No oropharyngeal exudate.  Eyes: Pupils are equal, round, and reactive to light. Conjunctivae are normal. Right eye exhibits no discharge. Left eye exhibits no discharge. No scleral icterus.  Eye glasses in place   Neck: Normal range of motion. No JVD present. No thyromegaly present.  Cardiovascular: Normal rate, regular rhythm, normal heart sounds and intact distal pulses. Exam reveals no gallop and no friction rub.  No murmur heard. Pulmonary/Chest: Effort normal and breath sounds normal. No respiratory distress. She has no wheezes. She has no rales.  Abdominal: Soft. Bowel sounds are normal. She exhibits no distension and no mass. There is no tenderness. There is no rebound and no guarding.  Musculoskeletal: She exhibits no edema or tenderness.  Moves x 4 extremities ambulates with walker  Lymphadenopathy:    She has no cervical adenopathy.  Neurological: She is oriented to person, place, and time.  Skin: Skin is warm and dry. Capillary refill takes 2 to 3 seconds. No rash noted. No erythema. No pallor.  Psychiatric: She has a normal mood and affect. Her speech is normal and behavior is normal. Judgment and thought content normal.  Vitals reviewed.   Labs reviewed: Recent Labs    05/31/18 0800  NA 135  K 4.2  CL 99  CO2 26  GLUCOSE 101*  BUN 23  CREATININE 0.95*  CALCIUM 9.3   Recent Labs    05/31/18 0800  AST 18  ALT 14  BILITOT 0.6  PROT 6.8   Recent Labs    05/31/18 0800 08/30/18 0820  WBC 5.4 6.6  NEUTROABS  --  3,960  HGB 13.1 13.7  HCT 36.7 39.6  MCV 90.6 92.5  PLT 244 266   Lab Results  Component Value Date   TSH 3.21 08/30/2018   No results found for: HGBA1C Lab Results  Component Value Date   CHOL 228 (H) 08/30/2018   HDL 45 (L) 08/30/2018   LDLCALC 165 (H) 08/30/2018   TRIG 74 08/30/2018   CHOLHDL 5.1 (H) 08/30/2018    Significant Diagnostic Results in last 30 days:  No  results found.  Assessment/Plan 1. Mixed hyperlipidemia LDL not at goal.Has increased her activity and exercises has lost 3 lbs since previous visit.encouraged to reduce saturated fats in her diet.continue on omega-3 fatty acids daily.Fasting lipid panel one week prior to her 3 months follow up visit.   2. CKD (chronic kidney disease) stage 3, GFR 30-59 ml/min (HCC) CR at baseline.continue to avoid nephrotoxins and dose medication for renal clearance.CMP in 3 months.  3. Major depression, chronic Mood stable.continue to participate in facility activities and walking.continue on mirtazapine 30 mg tablet at bedtime.monitor for mood changes.   4. Mild memory disturbance No behavioral issues reported.continue with supportive care.encourage participation in activities.  Family/ staff Communication: Reviewed plan of care with patient and daughter [name].  Labs/tests ordered: CBC/diff,CMP,TSH level and fasting Lipid panel   Nelda Bucks Ngetich, NP

## 2018-09-30 ENCOUNTER — Other Ambulatory Visit: Payer: Self-pay

## 2018-09-30 DIAGNOSIS — M50323 Other cervical disc degeneration at C6-C7 level: Secondary | ICD-10-CM | POA: Diagnosis not present

## 2018-09-30 DIAGNOSIS — M26602 Left temporomandibular joint disorder, unspecified: Secondary | ICD-10-CM | POA: Diagnosis not present

## 2018-09-30 DIAGNOSIS — M9907 Segmental and somatic dysfunction of upper extremity: Secondary | ICD-10-CM | POA: Diagnosis not present

## 2018-09-30 DIAGNOSIS — M546 Pain in thoracic spine: Secondary | ICD-10-CM | POA: Diagnosis not present

## 2018-09-30 DIAGNOSIS — M9902 Segmental and somatic dysfunction of thoracic region: Secondary | ICD-10-CM | POA: Diagnosis not present

## 2018-09-30 DIAGNOSIS — M542 Cervicalgia: Secondary | ICD-10-CM | POA: Diagnosis not present

## 2018-09-30 DIAGNOSIS — M9901 Segmental and somatic dysfunction of cervical region: Secondary | ICD-10-CM | POA: Diagnosis not present

## 2018-10-21 ENCOUNTER — Encounter: Payer: Self-pay | Admitting: Nurse Practitioner

## 2018-10-21 ENCOUNTER — Ambulatory Visit: Payer: Self-pay | Admitting: Nurse Practitioner

## 2018-10-21 VITALS — BP 112/68 | HR 83 | Temp 98.4°F | Wt 127.2 lb

## 2018-10-21 DIAGNOSIS — S0502XA Injury of conjunctiva and corneal abrasion without foreign body, left eye, initial encounter: Secondary | ICD-10-CM

## 2018-10-21 DIAGNOSIS — H1132 Conjunctival hemorrhage, left eye: Secondary | ICD-10-CM

## 2018-10-21 MED ORDER — ERYTHROMYCIN 5 MG/GM OP OINT: 1.0000 "application " | TOPICAL_OINTMENT | Freq: Every day | OPHTHALMIC | 0 refills | Status: AC

## 2018-10-21 NOTE — Patient Instructions (Addendum)
Corneal Abrasion --Use medication as prescribed. -Cool compresses to the eye for comfort. -Ibuprofen or Tylenol for pain, fever, or general discomfort. -Do not rub or scratch that I while symptoms persist. -Follow-up with your eye doctor in the next 3 to 5 days.  Would suggest a recheck in this timeframe, also follow-up if any change in vision, loss of vision, or difficulty seeing. -Follow-up in our office as needed.  A corneal abrasion is a scratch or injury to the clear covering over the front of your eye (cornea). Your cornea forms a clear dome that protects your eye and helps to focus your vision. Your cornea is made up of many layers. The surface layer is a single layer of cells (corneal epithelium). It is one of the most sensitive tissues in your body. A corneal abrasion can be very painful. If a corneal abrasion is not treated, it can become infected and cause an ulcer. This can lead to scarring. A scarred cornea can affect your vision. Sometimes abrasions come back in the same area, even after the original injury has healed (recurrent erosion syndrome). What are the causes? This condition may be caused by:  A poke in the eye.  A gritty or irritating substance (foreign body) in the eye.  Excessive eye rubbing.  Very dry eyes.  Certain eye infections.  Contact lenses that fit poorly or are worn for a long period of time. You can also injure your cornea when putting contacts lenses in your eye or taking them out.  Eye surgery.  Sometimes, the cause is unknown. What are the signs or symptoms? Symptoms of this condition include:  Eye pain. The pain may get worse when your eye is open or when you move your eye.  A feeling of something stuck in your eye.  Having trouble keeping your eye open, or not being able to keep it open.  Tearing and redness.  Sensitivity to light.  Blurred vision.  Headache.  How is this diagnosed? This condition may be diagnosed based  on:  Your medical history.  Your symptoms.  An eye exam. You may work with a health care provider who specializes in diseases and conditions of the eye (ophthalmologist). Before the eye exam, numbing drops may be put into your eye. You may also have dye put in your eye with a dropper or a small paper strip. The dye makes the abrasion easy to see when your ophthalmologist examines your eye with a light. Your ophthalmologist may look at your eye through an eye scope (slit lamp).  How is this treated? Treatment may vary depending on the cause of your condition, and it may include:  Washing out your eye.  Removing any foreign body.  Antibiotic drops or ointment to treat an infection.  Steroid drops or ointment to treat redness, irritation, or inflammation.  Pain medicine.  An eye patch to keep your eye closed.  Follow these instructions at home: Medicines  Use eye drops or ointments as told by your eye care provider.  If you were prescribed antibiotic drops or ointment, use them as told by your eye care provider. Do not stop using the antibiotic even if you start to feel better.  Take over-the-counter and prescription medicines only as told by your eye care provider.  Do not drive or use heavy machinery while taking prescription pain medicine. General instructions  If you have an eye patch, wear it as told by your eye care provider. ? Do not drive or use  machinery while wearing an eye patch. Your ability to judge distances will be impaired. ? Follow instructions from your eye care provider about when to remove the patch.  Ask your eye care provider whether you can use a cold, wet cloth (compress) on your eye to relieve pain.  Do not rub or touch your eye. Do not wash out your eye.  Do not wear contact lenses until your eye care provider says that this is okay.  Avoid bright light and eye strain.  Keep all follow-up visits as told by your eye care provider. This is important  for preventing infection and vision loss. Contact a health care provider if:  You continue to have eye pain and other symptoms for more than 2 days.  You develop new symptoms, such as redness, tearing, or discharge.  You have discharge that makes your eyelids stick together in the morning.  Your eye patch becomes so loose that you can blink your eye.  Symptoms return after the original abrasion has healed. Get help right away if:  You have severe eye pain that does not get better with medicine.  You have vision loss. Summary  A corneal abrasion is a scratch on the outer layer of the clear covering over the front of your eye (cornea).  Corneal abrasion can cause eye pain, redness, tearing, and blurred vision.  This condition is usually treated with medicine to prevent infection and scarring. You also may have to wear an eye patch to cover your eye.  Let your eye care provider know if your symptoms continue for more than 2 days. This information is not intended to replace advice given to you by your health care provider. Make sure you discuss any questions you have with your health care provider. Document Released: 11/28/2000 Document Revised: 11/11/2016 Document Reviewed: 11/11/2016 Elsevier Interactive Patient Education  [employer].

## 2018-10-21 NOTE — Progress Notes (Signed)
Subjective:    Janet [name] is a 84 y.o. female who presents for evaluation of foreign body sensation in the left eye. She has noticed the above symptoms for since this morning. Onset was sudden. Patient denies blurred vision, discharge, itching, pain, photophobia and tearing. There is a history of wearing glasses.  The following portions of the patient's history were reviewed and updated as appropriate: allergies, current medications and past medical history.  Review of Systems Constitutional: negative Eyes: positive for contacts/glasses, redness and See HPI, negative for cataracts, color blindness, icterus and visual disturbance Ears, nose, mouth, throat, and face: negative Respiratory: negative Cardiovascular: negative Neurological: negative   Objective:   Physical Exam  Constitutional: She is oriented to person, place, and time. She appears well-developed and well-nourished. No distress.  HENT:  Head: Normocephalic.  Right Ear: External ear normal.  Left Ear: External ear normal.  Eyes: Pupils are equal, round, and reactive to light. Right eye exhibits no discharge. Left eye exhibits no discharge.    Left canthus with subconjunctival hemorrhage. No drainage or tearing.  Neck: Normal range of motion. Neck supple.  Cardiovascular: Normal rate, regular rhythm and normal heart sounds.  Pulmonary/Chest: Effort normal and breath sounds normal.  Neurological: She is alert and oriented to person, place, and time.  Skin: Skin is warm and dry.  Psychiatric: She has a normal mood and affect.     Assessment:   Left Corneal Abrasion and Left Subconjunctival Hemorrhage  Plan:   Exam findings, diagnosis etiology and medication use and indications reviewed with patient. Follow- Up and discharge instructions provided. No emergent/urgent issues found on exam. Patient education was provided. Patient verbalized understanding of information provided and agrees with plan of care (POC), all  questions answered. The patient is advised to call or return to clinic if condition does not see an improvement in symptoms, or to seek the care of the closest emergency department if condition worsens with the above plan.   1. Abrasion of left cornea, initial encounter  - erythromycin ophthalmic ointment; Place 1 application into the left eye at bedtime for 10 days. Apply 1" ribbon to left eye at bedtime for 10 days.  Dispense: 10 g; Refill: 0 --Use medication as prescribed. -Cool compresses to the eye for comfort. -Ibuprofen or Tylenol for pain, fever, or general discomfort. -Do not rub or scratch that I while symptoms persist. -Follow-up with your eye doctor in the next 3 to 5 days.  Would suggest a recheck in this timeframe, also follow-up if any change in vision, loss of vision, or difficulty seeing. -Follow-up in our office as needed.   2. Conjunctival hemorrhage of left eye  -Ibuprofen or Tylenol for pain, fever, or general discomfort. -Do not rub or scratch that I while symptoms persist. -Follow-up with your eye doctor in the next 3 to 5 days.  Would suggest a recheck in this timeframe, also follow-up if any change in vision, loss of vision, or difficulty seeing. -Follow-up in our office as needed.

## 2018-10-25 DIAGNOSIS — H02055 Trichiasis without entropian left lower eyelid: Secondary | ICD-10-CM | POA: Diagnosis not present

## 2018-10-25 DIAGNOSIS — S0502XA Injury of conjunctiva and corneal abrasion without foreign body, left eye, initial encounter: Secondary | ICD-10-CM | POA: Diagnosis not present

## 2018-11-23 ENCOUNTER — Telehealth: Payer: Self-pay | Admitting: Neurology

## 2018-11-23 NOTE — Telephone Encounter (Signed)
Complete evaluation of cognitive impairment form faxed to [facility], confirmation received

## 2018-11-23 NOTE — Telephone Encounter (Signed)
Called, LVM for Cecille Rubin returning her call to see what further information they need.  Advised I do not believe we received attending physicians statement. Asked her to fax this at [phone] and to call back to discuss other form as well.

## 2018-11-23 NOTE — Telephone Encounter (Signed)
[email or web address] [phone] called asking for a call back from RN for additional clinical information.  Cecille Rubin also asked that it be known that they are still waiting on the attending physcians statement

## 2018-11-24 NOTE — Telephone Encounter (Signed)
Cognitive impairment form resubmitted to [employer]. Confirmation fax received.

## 2018-11-24 NOTE — Telephone Encounter (Signed)
Dr. Jannifer Franklin see message and paper work placed in out box regarding message.  Lori with [employer] returned call. She stated on the Cognitive Impairment form the question stating "if the patient needs continuous assistance" needs to be answered with a yes in order for the patient to qualify for long term care benefits.  Cecille Rubin states by answering yes the patient will be available to services if her condition changes and needs direct patient care.   Forms placed in MD's out box to review. MB RN

## 2018-11-24 NOTE — Telephone Encounter (Signed)
The form was reviewed, the appropriate box was checked.

## 2018-11-26 ENCOUNTER — Ambulatory Visit: Payer: Medicare Other | Admitting: Neurology

## 2018-12-20 NOTE — Telephone Encounter (Signed)
Opened in Error.

## 2018-12-22 ENCOUNTER — Encounter: Payer: Self-pay | Admitting: Internal Medicine

## 2018-12-22 ENCOUNTER — Other Ambulatory Visit: Payer: Self-pay | Admitting: Nurse Practitioner

## 2018-12-22 DIAGNOSIS — N183 Chronic kidney disease, stage 3 (moderate): Secondary | ICD-10-CM | POA: Diagnosis not present

## 2018-12-22 DIAGNOSIS — E782 Mixed hyperlipidemia: Secondary | ICD-10-CM | POA: Diagnosis not present

## 2018-12-22 DIAGNOSIS — R413 Other amnesia: Secondary | ICD-10-CM | POA: Diagnosis not present

## 2018-12-22 DIAGNOSIS — F329 Major depressive disorder, single episode, unspecified: Secondary | ICD-10-CM | POA: Diagnosis not present

## 2018-12-23 ENCOUNTER — Other Ambulatory Visit: Payer: Medicare Other

## 2018-12-23 DIAGNOSIS — R413 Other amnesia: Secondary | ICD-10-CM

## 2018-12-23 DIAGNOSIS — N183 Chronic kidney disease, stage 3 unspecified: Secondary | ICD-10-CM

## 2018-12-23 DIAGNOSIS — E782 Mixed hyperlipidemia: Secondary | ICD-10-CM

## 2018-12-23 DIAGNOSIS — F329 Major depressive disorder, single episode, unspecified: Secondary | ICD-10-CM

## 2018-12-23 LAB — COMPLETE METABOLIC PANEL WITH GFR
AG Ratio: 1.7 (calc) (ref 1.0–2.5)
ALT: 15 U/L (ref 6–29)
AST: 17 U/L (ref 10–35)
Albumin: 4 g/dL (ref 3.6–5.1)
Alkaline phosphatase (APISO): 46 U/L (ref 33–130)
BILIRUBIN TOTAL: 0.3 mg/dL (ref 0.2–1.2)
BUN/Creatinine Ratio: 21 (calc) (ref 6–22)
BUN: 23 mg/dL (ref 7–25)
CHLORIDE: 104 mmol/L (ref 98–110)
CO2: 25 mmol/L (ref 20–32)
Calcium: 9 mg/dL (ref 8.6–10.4)
Creat: 1.09 mg/dL — ABNORMAL HIGH (ref 0.60–0.88)
GFR, Est African American: 54 mL/min/{1.73_m2} — ABNORMAL LOW (ref >=60)
GFR, Est Non African American: 47 mL/min/{1.73_m2} — ABNORMAL LOW (ref >=60)
Globulin: 2.4 g/dL (calc) (ref 1.9–3.7)
Glucose, Bld: 97 mg/dL (ref 65–99)
Potassium: 4.6 mmol/L (ref 3.5–5.3)
Sodium: 137 mmol/L (ref 135–146)
Total Protein: 6.4 g/dL (ref 6.1–8.1)

## 2018-12-23 LAB — CBC WITH DIFFERENTIAL/PLATELET
Absolute Monocytes: 490 cells/uL (ref 200–950)
Basophils Absolute: 19 cells/uL (ref 0–200)
Basophils Relative: 0.3 %
Eosinophils Absolute: 143 cells/uL (ref 15–500)
Eosinophils Relative: 2.3 %
HEMATOCRIT: 36.1 % (ref 35.0–45.0)
Hemoglobin: 12.4 g/dL (ref 11.7–15.5)
Lymphs Abs: 1482 cells/uL (ref 850–3900)
MCH: 32.4 pg (ref 27.0–33.0)
MCHC: 34.3 g/dL (ref 32.0–36.0)
MCV: 94.3 fL (ref 80.0–100.0)
MPV: 9 fL (ref 7.5–12.5)
Monocytes Relative: 7.9 %
Neutro Abs: 4067 cells/uL (ref 1500–7800)
Neutrophils Relative %: 65.6 %
Platelets: 239 10*3/uL (ref 140–400)
RBC: 3.83 10*6/uL (ref 3.80–5.10)
RDW: 12.1 % (ref 11.0–15.0)
Total Lymphocyte: 23.9 %
WBC: 6.2 10*3/uL (ref 3.8–10.8)

## 2018-12-23 LAB — LIPID PANEL
CHOL/HDL RATIO: 4.6 (calc) (ref <5.0)
CHOLESTEROL: 187 mg/dL (ref <200)
HDL: 41 mg/dL — ABNORMAL LOW (ref >50)
LDL Cholesterol (Calc): 130 mg/dL (calc) — ABNORMAL HIGH
Non-HDL Cholesterol (Calc): 146 mg/dL (calc) — ABNORMAL HIGH (ref <130)
TRIGLYCERIDES: 66 mg/dL (ref <150)

## 2018-12-23 LAB — TSH: TSH: 3.47 mIU/L (ref 0.40–4.50)

## 2018-12-28 ENCOUNTER — Encounter: Payer: Self-pay | Admitting: Family

## 2018-12-29 ENCOUNTER — Encounter: Payer: Self-pay | Admitting: Internal Medicine

## 2018-12-29 ENCOUNTER — Non-Acute Institutional Stay: Payer: Medicare Other | Admitting: Internal Medicine

## 2018-12-29 VITALS — BP 110/60 | HR 73 | Temp 98.4°F

## 2018-12-29 DIAGNOSIS — R413 Other amnesia: Secondary | ICD-10-CM

## 2018-12-29 DIAGNOSIS — E782 Mixed hyperlipidemia: Secondary | ICD-10-CM

## 2018-12-29 DIAGNOSIS — F339 Major depressive disorder, recurrent, unspecified: Secondary | ICD-10-CM

## 2018-12-29 DIAGNOSIS — Z7189 Other specified counseling: Secondary | ICD-10-CM | POA: Diagnosis not present

## 2018-12-30 DIAGNOSIS — E785 Hyperlipidemia, unspecified: Secondary | ICD-10-CM | POA: Insufficient documentation

## 2018-12-30 DIAGNOSIS — F339 Major depressive disorder, recurrent, unspecified: Secondary | ICD-10-CM | POA: Insufficient documentation

## 2018-12-30 NOTE — Progress Notes (Signed)
Location:  [place] of Service:  Clinic (12)  Provider:   Code Status: DNR Goals of Care:  Advanced Directives 12/29/2018  Does Patient Have a Medical Advance Directive? Yes  Type of Paramedic of [place];Living will  Does patient want to make changes to medical advance directive? No - Patient declined  Copy of [place] in Chart? Yes - validated most recent copy scanned in chart (See row information)     Chief Complaint  Patient presents with  . Medical Management of Chronic Issues    routine 3 month follow up.remove septra off allergy list,discuss lab,go over MOST form,discuss depression medicine    HPI: Patient is a 85 y.o. female seen today for medical management of chronic diseases.   Patient was seen with her daughter today. Patient did not have any acute complains. She has h/o Hyperlipidemia, Cognitive Impairment,and Depression Patient walks around with her Kasandra Knudsen. Is very active. Watches her diet. Her daughter lives in [place] and other lives in [place]. They wanted to talk about her Labs, MOST form. They also wanted to know if I can can prescribe her Antidepressants so she does not have to seen her Psychiatrist.Her Mood is stable She has Caregiver who comes and helps her few hours at home .    Past Medical History:  Diagnosis Date  . BPV (benign positional vertigo)   . Cancer [facility])    uterine cancer  . Carpal tunnel syndrome on left 06/02/2017  . Cognitive changes   . Depression   . Droopy eyelid, right   . 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 Dyslipidemia   . Fibromyalgia   . Granuloma annulare   . History of uterine cancer 2003   treated with hysterectomy  . Lumbar radicular syndrome    left L5  . Macular degeneration    left eye  . Sciatica   . TIA (transient ischemic attack)     Past Surgical History:  Procedure Laterality Date  . ABDOMINAL HYSTERECTOMY    . BREAST BIOPSY  2007  .  CATARACT EXTRACTION    . TONSILLECTOMY      Allergies  Allergen Reactions  . Aricept [Donepezil Hcl]     Muscle cramps  . Latex Itching  . Septra [Sulfamethoxazole-Trimethoprim]   . Tramadol Other (See Comments)    Pt can't remember what side effects she had, she doesn't take it now    Outpatient Encounter Medications as of 12/29/2018  Medication Sig  . ASPIRIN 81 PO Take 1 tablet by mouth daily.  Marland Kitchen CALCIUM-VITAMIN D PO Take 1 tablet by mouth daily. 600mg  calcium, 800 units Vit D  . memantine (NAMENDA) 10 MG tablet TAKE 1 TABLET BY MOUTH TWICE DAILY.  . mirtazapine (REMERON) 30 MG tablet Take 30 mg by mouth at bedtime.   . Multiple Vitamins-Minerals (CENTRUM SILVER PO) Take 1 tablet by mouth daily.  . Multiple Vitamins-Minerals (PRESERVISION AREDS PO) Take 2 capsules by mouth daily.   . sertraline (ZOLOFT) 25 MG tablet Take 50 mg by mouth daily.   . vitamin B-12 (CYANOCOBALAMIN) 1000 MCG tablet Take 1,000 mcg by mouth daily. Patient taking 500 mg  . Dietary Management Product (AXONA) packet Take 40 g by mouth daily. (Patient not taking: Reported on 08/02/2018)  . [DISCONTINUED] Omega-3 Fatty Acids (FISH OIL PO) Take by mouth.  . [DISCONTINUED] triamcinolone cream (KENALOG) 0.1 % Apply 1 application topically as needed.    No facility-administered encounter medications on  file as of 12/29/2018.     Review of Systems:  Review of Systems  Review of Systems  Constitutional: Negative for activity change, appetite change, chills, diaphoresis, fatigue and fever.  HENT: Negative for mouth sores, postnasal drip, rhinorrhea, sinus pain and sore throat.   Respiratory: Negative for apnea, cough, chest tightness, shortness of breath and wheezing.   Cardiovascular: Negative for chest pain, palpitations and leg swelling.  Gastrointestinal: Negative for abdominal distention, abdominal pain, constipation, diarrhea, nausea and vomiting.  Genitourinary: Negative for dysuria and frequency.    Musculoskeletal: Negative for arthralgias, joint swelling and myalgias.  Skin: Negative for rash.  Neurological: Negative for dizziness, syncope, weakness, light-headedness and numbness.  Psychiatric/Behavioral: Negative for behavioral problems, confusion and sleep disturbance.     Health Maintenance  Topic Date Due  . DEXA SCAN  06/22/1999  . TETANUS/TDAP  06/07/2020  . INFLUENZA VACCINE  Completed  . PNA vac Low Risk Adult  Completed    Physical Exam: Vitals:   12/29/18 1612  BP: 110/60  Pulse: 73  Temp: 98.4 F (36.9 C)  TempSrc: Oral  SpO2: 94%  HC: 63" (160 cm)   There is no height or weight on file to calculate BMI. Physical Exam  Constitutional: Oriented to person, place, and time. Well-developed and well-nourished.  HENT:  Head: Normocephalic.  Mouth/Throat: Oropharynx is clear and moist.  Eyes: Pupils are equal, round, and reactive to light.  Neck: Neck supple.  Cardiovascular: Normal rate and normal heart sounds.  No murmur heard. Pulmonary/Chest: Effort normal and breath sounds normal. No respiratory distress. No wheezes. She has no rales.  Abdominal: Soft. Bowel sounds are normal. No distension. There is no tenderness. There is no rebound.  Musculoskeletal: No edema.  Lymphadenopathy: none Neurological: Alert and oriented to person, place, and time.  Skin: Skin is warm and dry.  Psychiatric: Normal mood and affect. Behavior is normal. Thought content normal.    Labs reviewed: Basic Metabolic Panel: Recent Labs    05/31/18 0800 08/30/18 0820 12/22/18 0000  NA 135  --  137  K 4.2  --  4.6  CL 99  --  104  CO2 26  --  25  GLUCOSE 101*  --  97  BUN 23  --  23  CREATININE 0.95*  --  1.09*  CALCIUM 9.3  --  9.0  TSH 3.78 3.21 3.47   Liver Function Tests: Recent Labs    05/31/18 0800 12/22/18 0000  AST 18 17  ALT 14 15  BILITOT 0.6 0.3  PROT 6.8 6.4   No results for input(s): LIPASE, AMYLASE in the last 8760 hours. No results for  input(s): AMMONIA in the last 8760 hours. CBC: Recent Labs    05/31/18 0800 08/30/18 0820 12/22/18 0000  WBC 5.4 6.6 6.2  NEUTROABS  --  3,960 4,067  HGB 13.1 13.7 12.4  HCT 36.7 39.6 36.1  MCV 90.6 92.5 94.3  PLT 244 266 239   Lipid Panel: Recent Labs    05/31/18 0800 08/30/18 0820 12/22/18 0000  CHOL 207* 228* 187  HDL 45* 45* 41*  LDLCALC 148* 165* 130*  TRIG 52 74 66  CHOLHDL 4.6 5.1* 4.6   No results found for: HGBA1C  Procedures since last visit: No results found.  Assessment/Plan Memory difficulty Patient had MRI in 2017 which showed Atrophy and Small Vessel Disease She is on Namenda  Mixed hyperlipidemia D/W the Patient and Daughter that her risk cane be decreased with Statin as she did  have ischemic changes on MRI  But right now they want to continue Dietary Modification  Depression, recurrent (HCC) Patient Mood is stable on Zoloft and Remeron Will Be comfortable to continue both. ACP D/W the Daughter in detail about there MOST form. Patient is DNR.with Limited Intervention       Labs/tests ordered  Next appt:  05/02/2019  Total time spent in this patient care encounter was 60_ minutes; greater than 50% of the visit spent counseling patient, reviewing records , Labs and coordinating care for problems addressed at this encounter.

## 2019-01-31 ENCOUNTER — Ambulatory Visit (INDEPENDENT_AMBULATORY_CARE_PROVIDER_SITE_OTHER): Payer: Medicare Other | Admitting: Neurology

## 2019-01-31 ENCOUNTER — Encounter: Payer: Self-pay | Admitting: Neurology

## 2019-01-31 VITALS — BP 102/54 | HR 87 | Ht 63.0 in | Wt 129.0 lb

## 2019-01-31 DIAGNOSIS — R413 Other amnesia: Secondary | ICD-10-CM | POA: Diagnosis not present

## 2019-01-31 DIAGNOSIS — R29898 Other symptoms and signs involving the musculoskeletal system: Secondary | ICD-10-CM

## 2019-01-31 MED ORDER — RIVASTIGMINE TARTRATE 1.5 MG PO CAPS: 1.5000 mg | ORAL_CAPSULE | Freq: Two times a day (BID) | ORAL | 2 refills | Status: DC

## 2019-01-31 NOTE — Progress Notes (Signed)
Reason for visit: Memory disturbance, dropped head syndrome  Janet [name] is an 85 y.o. female  History of present illness:  Janet [name] is an 85 year old left-handed white female with a history of dropped head syndrome.  The patient has been seeing a chiropractor who has helped her with this, when she walks, she tries to keep her head up, she has not progressed any in this regard with the dropped head syndrome.  The patient has had ongoing problems with memory.  There has been some mild progression of memory, she comes in with a daughter today.  She is having some increasing problems with naming, short-term memory, and keeping up with medications.  The patient is on Namenda, she could not tolerate Aricept because of muscle cramps.  She takes a supplement that is similar to [employer].  She comes to the office today for an evaluation.  The patient overall is doing well, she is remaining active, she has lost weight.  She has gotten back into painting.  Past Medical History:  Diagnosis Date  . BPV (benign positional vertigo)   . Cancer [facility])    uterine cancer  . Carpal tunnel syndrome on left 06/02/2017  . Cognitive changes   . Depression   . Droopy eyelid, right   . 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 Dyslipidemia   . Fibromyalgia   . Granuloma annulare   . History of uterine cancer 2003   treated with hysterectomy  . Lumbar radicular syndrome    left L5  . Macular degeneration    left eye  . Sciatica   . TIA (transient ischemic attack)     Past Surgical History:  Procedure Laterality Date  . ABDOMINAL HYSTERECTOMY    . BREAST BIOPSY  2007  . CATARACT EXTRACTION    . TONSILLECTOMY      Family History  Problem Relation Age of Onset  . Depression Mother   . Diabetes Father   . Heart attack Father   . Dementia Sister   . Pneumonia Brother   . Breast cancer Daughter     Social history:  reports that she has never smoked. She has never used smokeless tobacco. She reports that  she does not drink alcohol or use drugs.    Allergies  Allergen Reactions  . Aricept [Donepezil Hcl]     Muscle cramps  . Latex Itching  . Septra [Sulfamethoxazole-Trimethoprim]   . Tramadol Other (See Comments)    Pt can't remember what side effects she had, she doesn't take it now    Medications:  Prior to Admission medications   Medication Sig Start Date End Date Taking? Authorizing Provider  ASPIRIN 81 PO Take 1 tablet by mouth daily.   Yes [provider]  CALCIUM-VITAMIN D PO Take 1 tablet by mouth daily. 600mg  calcium, 800 units Vit D   Yes [provider]  Cyanocobalamin 5000 MCG CAPS Take by mouth.   Yes [provider]  Melatonin 3 MG CAPS Take by mouth.   Yes [provider]  memantine (NAMENDA) 10 MG tablet TAKE 1 TABLET BY MOUTH TWICE DAILY. 08/05/18  Yes Kathrynn Ducking, MD  mirtazapine (REMERON) 30 MG tablet Take 30 mg by mouth at bedtime.  10/10/16  Yes [provider]  Multiple Vitamins-Minerals (CENTRUM SILVER PO) Take 1 tablet by mouth daily.   Yes [provider]  Multiple Vitamins-Minerals (PRESERVISION AREDS PO) Take 2 capsules by mouth daily.    Yes [provider]  NON FORMULARY Prevision   Yes [provider]  sertraline (ZOLOFT) 25 MG tablet Take 50 mg by mouth daily.  10/10/16  Yes [provider]  UNABLE TO FIND Med Name: Hervey Ard Thought 1 capsule twice daily   Yes [provider]    ROS:  Out of a complete 14 system review of symptoms, the patient complains only of the following symptoms, and all other reviewed systems are negative.  Neck stiffness Memory loss  Blood pressure (!) 102/54, pulse 87, height 5\' 3"  (1.6 m), weight 129 lb (58.5 kg), SpO2 95 %.  Physical Exam  General: The patient is alert and cooperative at the time of the examination.  Skin: No significant peripheral edema is noted.   Neurologic Exam  Mental status: The patient is alert and  oriented x 3 at the time of the examination.  Mini-Mental status examination done today shows a total score of 24/30.   Cranial nerves: Facial symmetry is present. Speech is normal, no aphasia or dysarthria is noted. Extraocular movements are full. Visual fields are full.  Motor: The patient has good strength in all 4 extremities.  Sensory examination: Soft touch sensation is symmetric on the face, arms, and legs.  Coordination: The patient has good finger-nose-finger and heel-to-shin bilaterally.  Gait and station: The patient has a slightly wide-based, unsteady gait. Tandem gait was not tested.  Romberg is negative. No drift is seen.  Reflexes: Deep tendon reflexes are symmetric.   Assessment/Plan:  1.  Memory disturbance  2.  Dropped head syndrome  The patient is willing to try a low-dose of the Exelon, if she has recurrence of muscle cramps, she will need to stop the medication.  The patient will continue the [place].  They will call me regarding the Exelon, will go up on the dose if possible.  She will follow-up in 6 months.  Jill Alexanders MD 01/31/2019 2:50 PM  [facility] [address] [place], [place] [address]  Phone [phone] Fax [phone]

## 2019-01-31 NOTE — Patient Instructions (Signed)
We will start Exelon for the memory, 1.5 mg twice a day.

## 2019-02-02 ENCOUNTER — Encounter: Payer: Self-pay | Admitting: Internal Medicine

## 2019-02-08 ENCOUNTER — Encounter: Payer: Self-pay | Admitting: Family

## 2019-02-08 ENCOUNTER — Ambulatory Visit (INDEPENDENT_AMBULATORY_CARE_PROVIDER_SITE_OTHER): Payer: Medicare Other | Admitting: Family

## 2019-02-08 VITALS — BP 118/64 | HR 82 | Temp 98.0°F | Ht 64.0 in | Wt 129.0 lb

## 2019-02-08 DIAGNOSIS — R0982 Postnasal drip: Secondary | ICD-10-CM | POA: Diagnosis not present

## 2019-02-08 DIAGNOSIS — J069 Acute upper respiratory infection, unspecified: Secondary | ICD-10-CM | POA: Diagnosis not present

## 2019-02-08 MED ORDER — LORATADINE 10 MG PO TABS: 10.0000 mg | ORAL_TABLET | Freq: Every day | ORAL | 11 refills | Status: DC

## 2019-02-08 MED ORDER — GUAIFENESIN 100 MG/5ML PO LIQD: 200.0000 mg | Freq: Three times a day (TID) | ORAL | 0 refills | Status: DC | PRN

## 2019-02-08 NOTE — Progress Notes (Signed)
Provider: Dinah Ngetich FNP-C  [name], MD  Patient Care Team: [name], MD as PCP - General (Internal Medicine) [name], MD as Referring Physician (Physical Medicine and Rehabilitation) Lajean Manes, MD as Consulting Physician (Internal Medicine)  Extended Emergency Contact Information Primary Emergency Contact: [name], [place] Phone: [phone] Relation: Daughter Secondary Emergency Contact: Myers,Lois  [place] Phone: [phone] Relation: Daughter  Goals of care: Advanced Directive information Advanced Directives 02/08/2019  Does Patient Have a Medical Advance Directive? Yes  Type of Paramedic of [place];Living will  Does patient want to make changes to medical advance directive? No - Patient declined  Copy of [place] in Chart? Yes - validated most recent copy scanned in chart (See row information)     Chief Complaint  Patient presents with  . Acute Visit    Patient c/o of chest and head congestion along with coughing for about a week now. Patient also states she has had a on going problems with not sleeping     HPI:  Pt is a 85 y.o. female seen today for an acute visit for evaluation of cough,chest and head congestion x 1 week.she resides at [facility] independent apartment.she is here with her sitter.she states has been coughing and has nasal drainage to her throat.she states seems to be getting better.she has taken loratadine for four days.she has not been sleeping well due to cough.currently on melatonin 3 mg tablet though daughter states has also taken upto 6 mg tablet without help.she denies any fever,chills or shortness of breath.she states her appetite is good.Patient's daughter lois also called on the phone to update on patient's medical plan.       Past Medical History:  Diagnosis Date  . BPV (benign positional  vertigo)   . Cancer [facility])    uterine cancer  . Carpal tunnel syndrome on left 06/02/2017  . Cognitive changes   . Depression   . Droopy eyelid, right   . 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 Dyslipidemia   . Fibromyalgia   . Granuloma annulare   . History of uterine cancer 2003   treated with hysterectomy  . Lumbar radicular syndrome    left L5  . Macular degeneration    left eye  . Sciatica   . TIA (transient ischemic attack)    Past Surgical History:  Procedure Laterality Date  . ABDOMINAL HYSTERECTOMY    . BREAST BIOPSY  2007  . CATARACT EXTRACTION    . TONSILLECTOMY      Allergies  Allergen Reactions  . Aricept [Donepezil Hcl]     Muscle cramps  . Latex Itching  . Septra [Sulfamethoxazole-Trimethoprim]   . Tramadol Other (See Comments)    Pt can't remember what side effects she had, she doesn't take it now    Outpatient Encounter Medications as of 02/08/2019  Medication Sig  . ASPIRIN 81 PO Take 1 tablet by mouth daily.  Marland Kitchen CALCIUM-VITAMIN D PO Take 1 tablet by mouth daily. 600mg  calcium, 800 units Vit D  . Cyanocobalamin 5000 MCG CAPS Take by mouth.  . Melatonin 3 MG CAPS Take by mouth.  . memantine (NAMENDA) 10 MG tablet TAKE 1 TABLET BY MOUTH TWICE DAILY.  . mirtazapine (REMERON) 30 MG tablet Take 30 mg by mouth at bedtime.   . Multiple Vitamins-Minerals (CENTRUM SILVER PO) Take 1 tablet by mouth  daily.  . Multiple Vitamins-Minerals (PRESERVISION AREDS PO) Take 2 capsules by mouth daily.   . NON FORMULARY Prevision  . rivastigmine (EXELON) 1.5 MG capsule Take 1 capsule (1.5 mg total) by mouth 2 (two) times daily.  . sertraline (ZOLOFT) 25 MG tablet Take 50 mg by mouth daily.   Marland Kitchen UNABLE TO FIND Med Name: Hervey Ard Thought 1 capsule twice daily   No facility-administered encounter medications on file as of 02/08/2019.     Review of Systems  Constitutional: Negative for appetite change, chills and fever.  HENT: Positive for postnasal drip and rhinorrhea. Negative  for congestion, ear discharge, ear pain, sinus pressure, sinus pain, sneezing and sore throat.   Eyes: Positive for visual disturbance. Negative for pain, discharge, redness and itching.       Wears eye glasses   Respiratory: Positive for cough. Negative for chest tightness, shortness of breath and wheezing.   Cardiovascular: Negative for chest pain, palpitations and leg swelling.  Gastrointestinal: Negative for abdominal distention, abdominal pain, constipation, diarrhea, nausea and vomiting.  Musculoskeletal: Positive for gait problem.  Skin: Negative for color change, pallor and rash.  Neurological: Negative for dizziness, light-headedness and headaches.  Psychiatric/Behavioral: Positive for sleep disturbance. Negative for agitation and confusion. The patient is not nervous/anxious.     Immunization History  Administered Date(s) Administered  . Influenza, High Dose Seasonal PF 09/23/2017  . Influenza,inj,Quad PF,6+ Mos 09/16/2018  . Influenza-Unspecified 09/14/2017   Pertinent  Health Maintenance Due  Topic Date Due  . DEXA SCAN  06/22/1999  . INFLUENZA VACCINE  Completed  . PNA vac Low Risk Adult  Completed   Fall Risk  02/08/2019 12/29/2018 09/21/2018 05/01/2017  Falls in the past year? 0 0 No Yes  Number falls in past yr: 0 0 - 1  Injury with Fall? 0 0 - No  Risk for fall due to : - - - Impaired balance/gait    Vitals:   02/08/19 1349  BP: 118/64  Pulse: 82  Temp: 98 F (36.7 C)  SpO2: 96%  Weight: 129 lb (58.5 kg)  Height: 5\' 4"  (1.626 m)   Body mass index is 22.14 kg/m. Physical Exam Constitutional:      General: She is not in acute distress.    Appearance: She is normal weight. She is not ill-appearing.  HENT:     Head: Normocephalic.     Right Ear: Tympanic membrane, ear canal and external ear normal. There is no impacted cerumen.     Left Ear: There is impacted cerumen.     Nose: Rhinorrhea present. No nasal tenderness or congestion.     Right Turbinates:  Not enlarged or swollen.     Left Turbinates: Not enlarged or swollen.     Right Sinus: No maxillary sinus tenderness or frontal sinus tenderness.     Left Sinus: No maxillary sinus tenderness or frontal sinus tenderness.     Comments: Post nasal drip     Mouth/Throat:     Mouth: Mucous membranes are moist.     Pharynx: No posterior oropharyngeal erythema.  Eyes:     General: No scleral icterus.       Right eye: No discharge.        Left eye: No discharge.     Conjunctiva/sclera: Conjunctivae normal.     Pupils: Pupils are equal, round, and reactive to light.  Neck:     Musculoskeletal: No neck rigidity or muscular tenderness.  Cardiovascular:     Rate and Rhythm: Normal  rate and regular rhythm.     Pulses: Normal pulses.     Heart sounds: Normal heart sounds. No murmur. No friction rub. No gallop.   Pulmonary:     Effort: Pulmonary effort is normal. No respiratory distress.     Breath sounds: Normal breath sounds. No wheezing, rhonchi or rales.  Chest:     Chest wall: No tenderness.  Abdominal:     General: Bowel sounds are normal. There is no distension.     Palpations: Abdomen is soft. There is no mass.     Tenderness: There is abdominal tenderness. There is no right CVA tenderness, left CVA tenderness, guarding or rebound.  Musculoskeletal:        General: No tenderness.     Right lower leg: No edema.     Left lower leg: No edema.     Comments: Unsteady gait use right hand cane  Lymphadenopathy:     Cervical: No cervical adenopathy.  Skin:    General: Skin is warm and dry.     Coloration: Skin is not pale.     Findings: No erythema or rash.  Neurological:     Mental Status: She is alert and oriented to person, place, and time.     Cranial Nerves: No cranial nerve deficit.     Sensory: No sensory deficit.     Coordination: Coordination normal.     Gait: Gait abnormal.  Psychiatric:        Mood and Affect: Mood normal.        Behavior: Behavior normal.         Thought Content: Thought content normal.        Judgment: Judgment normal.    Labs reviewed: Recent Labs    05/31/18 0800 12/22/18 0000  NA 135 137  K 4.2 4.6  CL 99 104  CO2 26 25  GLUCOSE 101* 97  BUN 23 23  CREATININE 0.95* 1.09*  CALCIUM 9.3 9.0   Recent Labs    05/31/18 0800 12/22/18 0000  AST 18 17  ALT 14 15  BILITOT 0.6 0.3  PROT 6.8 6.4   Recent Labs    05/31/18 0800 08/30/18 0820 12/22/18 0000  WBC 5.4 6.6 6.2  NEUTROABS  --  3,960 4,067  HGB 13.1 13.7 12.4  HCT 36.7 39.6 36.1  MCV 90.6 92.5 94.3  PLT 244 266 239   Lab Results  Component Value Date   TSH 3.47 12/22/2018   No results found for: HGBA1C Lab Results  Component Value Date   CHOL 187 12/22/2018   HDL 41 (L) 12/22/2018   LDLCALC 130 (H) 12/22/2018   TRIG 66 12/22/2018   CHOLHDL 4.6 12/22/2018    Significant Diagnostic Results in last 30 days:  No results found.  Assessment/Plan 1. Postnasal drip Afebrile.nasal exam normal except rhinorrhea encouraged to restart Loratadine 10 mg tablet daily x 14 days.  2. URI with cough and congestion Cough has improved per patient.Lungs clear to auscultation.suspect possible irritation from post nasal drip. - Check Temperature and notify provider's office if Temp > 100.5 or if symptoms worsen. - Loratadine 10 mg tablet take one by mouth daily x 14 days  - Guaifenesin take 10 ml by mouth three times daily as needed for cough   - Drink plenty of fluid /soup  Family/ staff Communication: Reviewed plan of care with patient,sitter and patient's daughter [name] on the phone.   Labs/tests ordered: None   Dinah C Ngetich, NP

## 2019-02-08 NOTE — Patient Instructions (Addendum)
1. Check Temperature and notify provider's office if Temp > 100.5 or if symptoms worsen. 2. Loratadine 10 mg tablet take one by mouth daily x 14 days  3. Guaifenesin take 10 ml by mouth three times daily as needed for cough   4. Drink plenty of fluid /soup  Postnasal Drip Postnasal drip is the feeling of mucus going down the back of your throat. Mucus is a slimy substance that moistens and cleans your nose and throat, as well as the air pockets in face bones near your forehead and cheeks (sinuses). Small amounts of mucus pass from your nose and sinuses down the back of your throat all the time. This is normal. When you produce too much mucus or the mucus gets too thick, you can feel it. Some common causes of postnasal drip include:  Having more mucus because of: ? A cold or the flu. ? Allergies. ? Cold air. ? Certain medicines.  Having more mucus that is thicker because of: ? A sinus or nasal infection. ? Dry air. ? A food allergy. Follow these instructions at home: Relieving discomfort   Gargle with a salt-water mixture 3-4 times a day or as needed. To make a salt-water mixture, completely dissolve -1 tsp of salt in 1 cup of warm water.  If the air in your home is dry, use a humidifier to add moisture to the air.  Use a saline spray or container (neti pot) to flush out the nose (nasal irrigation). These methods can help clear away mucus and keep the nasal passages moist. General instructions  Take over-the-counter and prescription medicines only as told by your health care provider.  Follow instructions from your health care provider about eating or drinking restrictions. You may need to avoid caffeine.  Avoid things that you know you are allergic to (allergens), like dust, mold, pollen, pets, or certain foods.  Drink enough fluid to keep your urine pale yellow.  Keep all follow-up visits as told by your health care provider. This is important. Contact a health care provider  if:  You have a fever.  You have a sore throat.  You have difficulty swallowing.  You have headache.  You have sinus pain.  You have a cough that does not go away.  The mucus from your nose becomes thick and is green or yellow in color.  You have cold or flu symptoms that last more than 10 days. Summary  Postnasal drip is the feeling of mucus going down the back of your throat.  If your health care provider approves, use nasal irrigation or a nasal spray 2?4 times a day.  Avoid things that you know you are allergic to (allergens), like dust, mold, pollen, pets, or certain foods. This information is not intended to replace advice given to you by your health care provider. Make sure you discuss any questions you have with your health care provider. Document Released: 03/16/2017 Document Revised: 03/16/2017 Document Reviewed: 03/16/2017 Elsevier Interactive Patient Education  2019 [employer].

## 2019-02-28 ENCOUNTER — Encounter: Payer: Self-pay | Admitting: Internal Medicine

## 2019-03-02 ENCOUNTER — Other Ambulatory Visit: Payer: Self-pay

## 2019-03-02 MED ORDER — SERTRALINE HCL 25 MG PO TABS: 50.0000 mg | ORAL_TABLET | Freq: Every day | ORAL | 0 refills | Status: DC

## 2019-03-02 NOTE — Telephone Encounter (Signed)
Opened in error

## 2019-03-29 ENCOUNTER — Encounter: Payer: Self-pay | Admitting: Internal Medicine

## 2019-03-31 ENCOUNTER — Other Ambulatory Visit: Payer: Self-pay | Admitting: *Deleted

## 2019-03-31 MED ORDER — MIRTAZAPINE 30 MG PO TABS: 30.0000 mg | ORAL_TABLET | Freq: Every day | ORAL | 0 refills | Status: DC

## 2019-03-31 NOTE — Telephone Encounter (Signed)
Daughter called and requested refill. She stated that you told her that you will start refilling this medication for her.   Pended and sent to Dr. Lyndel Safe for approval.

## 2019-04-01 ENCOUNTER — Other Ambulatory Visit: Payer: Self-pay | Admitting: Internal Medicine

## 2019-04-01 NOTE — Telephone Encounter (Signed)
No access to [place] Database  Last refill 03/02/2019 Routed to provider for approval

## 2019-04-20 ENCOUNTER — Encounter: Payer: Self-pay | Admitting: Internal Medicine

## 2019-04-21 NOTE — Telephone Encounter (Signed)
Dr. Lyndel Safe did you mean to send this to the pt? You sent back to me, I was just forwarding the message. thanks

## 2019-04-21 NOTE — Telephone Encounter (Signed)
Please see note from Dr. Lyndel Safe

## 2019-05-01 ENCOUNTER — Other Ambulatory Visit: Payer: Self-pay | Admitting: Neurology

## 2019-05-02 ENCOUNTER — Other Ambulatory Visit: Payer: Self-pay | Admitting: *Deleted

## 2019-05-02 ENCOUNTER — Other Ambulatory Visit: Payer: Self-pay

## 2019-05-02 MED ORDER — SERTRALINE HCL 25 MG PO TABS: ORAL_TABLET | ORAL | 1 refills | Status: DC

## 2019-05-02 NOTE — Telephone Encounter (Signed)
[facility]  

## 2019-05-03 ENCOUNTER — Other Ambulatory Visit: Payer: Self-pay | Admitting: Neurology

## 2019-05-04 ENCOUNTER — Encounter: Payer: Self-pay | Admitting: Internal Medicine

## 2019-06-07 ENCOUNTER — Other Ambulatory Visit: Payer: Self-pay | Admitting: Neurology

## 2019-06-28 ENCOUNTER — Other Ambulatory Visit: Payer: Self-pay | Admitting: Internal Medicine

## 2019-06-29 NOTE — Telephone Encounter (Signed)
High risk or very high risk warning populated when attempting to refill medication. RX request sent to PCP for review and approval if warranted.   

## 2019-07-12 ENCOUNTER — Other Ambulatory Visit: Payer: Self-pay | Admitting: Neurology

## 2019-07-28 ENCOUNTER — Other Ambulatory Visit: Payer: Self-pay

## 2019-07-28 ENCOUNTER — Telehealth: Payer: Self-pay | Admitting: *Deleted

## 2019-07-28 ENCOUNTER — Other Ambulatory Visit: Payer: Self-pay | Admitting: *Deleted

## 2019-07-28 DIAGNOSIS — E785 Hyperlipidemia, unspecified: Secondary | ICD-10-CM

## 2019-07-28 DIAGNOSIS — R413 Other amnesia: Secondary | ICD-10-CM

## 2019-07-28 NOTE — Telephone Encounter (Signed)
Spoke with daughter regarding her mother's labs, she stated that she was told that her mother would be having labs today. I informed her that I didn't have her down for labs but there was a appointment but it had been cancelled. She stated that she could have confused things when she cancelled her mother's labs in May. I got her number and told her would find out what transpired and give her a call back.

## 2019-07-28 NOTE — Telephone Encounter (Signed)
Spoke with patient's daughter regarding her labs , told her that I had rescheduled her labs to Monday 08/01/19 @ 7:45am. She stated that she would let her mother know and she will be there on Monday.

## 2019-08-01 ENCOUNTER — Other Ambulatory Visit: Payer: Self-pay

## 2019-08-01 LAB — COMPREHENSIVE METABOLIC PANEL
AG Ratio: 1.8 (calc) (ref 1.0–2.5)
ALT: 13 U/L (ref 6–29)
AST: 17 U/L (ref 10–35)
Albumin: 4.1 g/dL (ref 3.6–5.1)
Alkaline phosphatase (APISO): 36 U/L — ABNORMAL LOW (ref 37–153)
BUN/Creatinine Ratio: 23 (calc) — ABNORMAL HIGH (ref 6–22)
BUN: 25 mg/dL (ref 7–25)
CO2: 28 mmol/L (ref 20–32)
Calcium: 9.3 mg/dL (ref 8.6–10.4)
Chloride: 103 mmol/L (ref 98–110)
Creat: 1.07 mg/dL — ABNORMAL HIGH (ref 0.60–0.88)
Globulin: 2.3 g/dL (calc) (ref 1.9–3.7)
Glucose, Bld: 92 mg/dL (ref 65–99)
Potassium: 4.4 mmol/L (ref 3.5–5.3)
Sodium: 137 mmol/L (ref 135–146)
Total Bilirubin: 0.6 mg/dL (ref 0.2–1.2)
Total Protein: 6.4 g/dL (ref 6.1–8.1)

## 2019-08-01 LAB — CBC WITH DIFFERENTIAL/PLATELET
Absolute Monocytes: 400 cells/uL (ref 200–950)
Basophils Absolute: 20 cells/uL (ref 0–200)
Basophils Relative: 0.4 %
Eosinophils Absolute: 80 cells/uL (ref 15–500)
Eosinophils Relative: 1.6 %
HCT: 36 % (ref 35.0–45.0)
Hemoglobin: 12.4 g/dL (ref 11.7–15.5)
Lymphs Abs: 1550 cells/uL (ref 850–3900)
MCH: 32.7 pg (ref 27.0–33.0)
MCHC: 34.4 g/dL (ref 32.0–36.0)
MCV: 95 fL (ref 80.0–100.0)
MPV: 9 fL (ref 7.5–12.5)
Monocytes Relative: 8 %
Neutro Abs: 2950 cells/uL (ref 1500–7800)
Neutrophils Relative %: 59 %
Platelets: 227 10*3/uL (ref 140–400)
RBC: 3.79 10*6/uL — ABNORMAL LOW (ref 3.80–5.10)
RDW: 12.6 % (ref 11.0–15.0)
Total Lymphocyte: 31 %
WBC: 5 10*3/uL (ref 3.8–10.8)

## 2019-08-01 LAB — LIPID PANEL
Cholesterol: 194 mg/dL (ref <200)
HDL: 41 mg/dL — ABNORMAL LOW (ref >=50)
LDL Cholesterol (Calc): 140 mg/dL (calc) — ABNORMAL HIGH
Non-HDL Cholesterol (Calc): 153 mg/dL (calc) — ABNORMAL HIGH (ref <130)
Total CHOL/HDL Ratio: 4.7 (calc) (ref <5.0)
Triglycerides: 44 mg/dL (ref <150)

## 2019-08-02 ENCOUNTER — Telehealth: Payer: Self-pay

## 2019-08-02 NOTE — Telephone Encounter (Signed)
I can understand. Her labs are normal except LDL is High. She does need to come for Office visit for follow up of her other issues as we have to Refill her Antidepressant also. She needs to reschedule her appointment

## 2019-08-02 NOTE — Telephone Encounter (Signed)
Janet [name], patients daughter called to cancel appointment with Dr.Gupta for tomorrow. Janet [name] states her mother has an appointment with Dr.Wills (Neuorlogist) this week and she would like to avoid her mother seeing multiple providers in 1 week.  Janet [name] feels the appointment with the neurologist supersedes appointment with Dr.Gupta yet she would like for someone to call her when Dr.Gupta addresses patients labs.    Based on labs if Dr.Gupta thinks patient needs to be seen Janet [name] will reschedule for a later date.  Janet [name] is aware I will forward this message to Dr.Gupta and her assistant Ivin Booty, [place] for further follow-up

## 2019-08-02 NOTE — Telephone Encounter (Signed)
Spoke with patient daughter regarding her lab results, she stated that she cancelled her mother appointment because she has a neurology appointment this week. So doesn't want to have her going to much in one week. She stated that she understood the results and she remembers talking with Dr. Lyndel Safe regarding the medication(statins) but she want to talk with the neuro doctor before starting a new medication if that is what they decided to do. She is willing to talk with Dr. Lyndel Safe on the phone if wants, she is still unable to come with her mother to her appointment per [facility]. She ask me to schedule another appointment for her mother in September in the late afternoon, maybe she will be able to come to the appointment.

## 2019-08-03 ENCOUNTER — Encounter: Payer: Self-pay | Admitting: Internal Medicine

## 2019-08-09 ENCOUNTER — Encounter: Payer: Self-pay | Admitting: Internal Medicine

## 2019-08-10 ENCOUNTER — Ambulatory Visit (INDEPENDENT_AMBULATORY_CARE_PROVIDER_SITE_OTHER): Payer: Medicare Other | Admitting: Neurology

## 2019-08-10 ENCOUNTER — Other Ambulatory Visit: Payer: Self-pay

## 2019-08-10 ENCOUNTER — Encounter: Payer: Self-pay | Admitting: Neurology

## 2019-08-10 VITALS — BP 158/83 | HR 78 | Temp 98.2°F | Ht 64.0 in | Wt 126.0 lb

## 2019-08-10 DIAGNOSIS — R413 Other amnesia: Secondary | ICD-10-CM | POA: Diagnosis not present

## 2019-08-10 DIAGNOSIS — R29898 Other symptoms and signs involving the musculoskeletal system: Secondary | ICD-10-CM

## 2019-08-10 MED ORDER — RIVASTIGMINE TARTRATE 3 MG PO CAPS: 3.0000 mg | ORAL_CAPSULE | Freq: Two times a day (BID) | ORAL | 2 refills | Status: DC

## 2019-08-10 NOTE — Patient Instructions (Signed)
We will go up on the Exelon to 3 mg twice a day.  Look out for muscle cramps with this dose increase.

## 2019-08-10 NOTE — Progress Notes (Signed)
Reason for visit: Memory disorder  Janet [name] is an 85 y.o. female  History of present illness:  Janet [name] is an 85 year old left-handed white female with a history of a progressive memory disorder and a history of dropped head syndrome.  The patient has done well on low-dose Exelon, on Aricept she had muscle cramps.  She comes in with a caretaker today.  Her daughter communicated yesterday that there were some concerns about worsening memory versus increased depression.  The patient is at times not washing her hair for over a week.  She does have an assistant coming into her apartment on a daily basis to help her.  Her daughter will come in and fill up the pill dispenser once a week, she takes medications on her own.  She has been socially isolated with a COVID virus pandemic.  She reports that she does not believe that she is depressed, she keeps herself busy with reading, she will sometimes walk outside when someone is with her.  She does have some problems with insomnia at night.  She returns for an evaluation.  Past Medical History:  Diagnosis Date  . BPV (benign positional vertigo)   . Cancer [facility])    uterine cancer  . Carpal tunnel syndrome on left 06/02/2017  . Cognitive changes   . Depression   . Droopy eyelid, right   . 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 Dyslipidemia   . Fibromyalgia   . Granuloma annulare   . History of uterine cancer 2003   treated with hysterectomy  . Lumbar radicular syndrome    left L5  . Macular degeneration    left eye  . Sciatica   . TIA (transient ischemic attack)     Past Surgical History:  Procedure Laterality Date  . ABDOMINAL HYSTERECTOMY    . BREAST BIOPSY  2007  . CATARACT EXTRACTION    . TONSILLECTOMY      Family History  Problem Relation Age of Onset  . Depression Mother   . Diabetes Father   . Heart attack Father   . Dementia Sister   . Pneumonia Brother   . Breast cancer Daughter     Social history:  reports that she  has never smoked. She has never used smokeless tobacco. She reports that she does not drink alcohol or use drugs.    Allergies  Allergen Reactions  . Aricept [Donepezil Hcl]     Muscle cramps  . Latex Itching  . Septra [Sulfamethoxazole-Trimethoprim]   . Tramadol Other (See Comments)    Pt can't remember what side effects she had, she doesn't take it now    Medications:  Prior to Admission medications   Medication Sig Start Date End Date Taking? Authorizing Provider  ASPIRIN 81 PO Take 1 tablet by mouth daily.   Yes [provider]  CALCIUM-VITAMIN D PO Take 1 tablet by mouth daily. 600mg  calcium, 800 units Vit D   Yes [provider]  Cyanocobalamin 5000 MCG CAPS Take by mouth.   Yes [provider]  guaiFENesin (ROBITUSSIN) 100 MG/5ML liquid Take 10 mLs (200 mg total) by mouth 3 (three) times daily as needed for cough. 02/08/19  Yes Ngetich, Dinah C, NP  memantine (NAMENDA) 10 MG tablet TAKE 1 TABLET BY MOUTH TWICE DAILY. 07/12/19  Yes Kathrynn Ducking, MD  mirtazapine (REMERON) 30 MG tablet TAKE ONE TABLET AT BEDTIME. 06/29/19  Yes [name], MD  Multiple Vitamins-Minerals (CENTRUM SILVER PO) Take 1  tablet by mouth daily.   Yes [provider]  Multiple Vitamins-Minerals (PRESERVISION AREDS PO) Take 2 capsules by mouth daily.    Yes [provider]  NON FORMULARY Prevision   Yes [provider]  rivastigmine (EXELON) 1.5 MG capsule TAKE (1) CAPSULE TWICE DAILY. 06/07/19  Yes Kathrynn Ducking, MD  sertraline (ZOLOFT) 25 MG tablet Take two tablets by mouth once daily 05/02/19  Yes [name], MD  UNABLE TO FIND Med Name: Hervey Ard Thought 1 capsule twice daily   Yes [provider]  loratadine (CLARITIN) 10 MG tablet Take 1 tablet (10 mg total) by mouth daily for 14 days. 02/08/19 02/22/19  Ngetich, Dinah C, NP    ROS:  Out of a complete 14 system review of symptoms, the patient complains only of the following  symptoms, and all other reviewed systems are negative.  Memory disturbance Dropped head Insomnia  Blood pressure (!) 158/83, pulse 78, temperature 98.2 F (36.8 C), height 5\' 4"  (1.626 m), weight 126 lb (57.2 kg).  Physical Exam  General: The patient is alert and cooperative at the time of the examination.  Skin: No significant peripheral edema is noted.   Neurologic Exam  Mental status: The patient is alert and oriented x 3 at the time of the examination. The Mini-Mental Status Examination done today shows a total score 26/30.  The patient is able to name 8 animals in 1 minute.   Cranial nerves: Facial symmetry is present. Speech is normal, no aphasia or dysarthria is noted. Extraocular movements are full. Visual fields are full.  Motor: The patient has good strength in all 4 extremities.  Sensory examination: Soft touch sensation is symmetric on the face, arms, and legs.  Coordination: The patient has good finger-nose-finger and heel-to-shin bilaterally.  Gait and station: The patient has a slightly wide-based gait, the patient uses a cane for ambulation. Tandem gait is unsteady. Romberg is negative. No drift is seen.  Reflexes: Deep tendon reflexes are symmetric.   Assessment/Plan:  1.  Memory disturbance  2.  Dropped head syndrome  The patient will be increased on the Exelon to 3 mg twice daily, she has tolerated the low-dose of 1.5 mg without muscle cramps.  She is socially isolated at this time, this may adversely affect cognitive functioning but she actually scored 2 points higher today on the Mini-Mental Status Examination than she did on her last visit.  She will follow-up here in 6 months.  Jill Alexanders MD 08/10/2019 12:25 PM  [facility] [address] [place], [place] [address]  Phone [phone] Fax [phone]

## 2019-08-17 ENCOUNTER — Ambulatory Visit: Payer: Self-pay | Admitting: Internal Medicine

## 2019-08-23 ENCOUNTER — Other Ambulatory Visit: Payer: Self-pay | Admitting: Neurology

## 2019-08-23 MED ORDER — RIVASTIGMINE TARTRATE 1.5 MG PO CAPS: 1.5000 mg | ORAL_CAPSULE | Freq: Two times a day (BID) | ORAL | 3 refills | Status: DC

## 2019-09-06 ENCOUNTER — Encounter: Payer: Self-pay | Admitting: Internal Medicine

## 2019-09-07 ENCOUNTER — Telehealth: Payer: Self-pay | Admitting: *Deleted

## 2019-09-07 ENCOUNTER — Other Ambulatory Visit: Payer: Self-pay

## 2019-09-07 ENCOUNTER — Non-Acute Institutional Stay: Payer: Medicare Other | Admitting: Internal Medicine

## 2019-09-07 ENCOUNTER — Encounter: Payer: Self-pay | Admitting: Internal Medicine

## 2019-09-07 ENCOUNTER — Ambulatory Visit: Payer: Self-pay | Admitting: Internal Medicine

## 2019-09-07 VITALS — BP 138/64 | HR 91 | Temp 97.1°F | Resp 18 | Ht 64.0 in | Wt 127.2 lb

## 2019-09-07 DIAGNOSIS — F0280 Dementia in other diseases classified elsewhere without behavioral disturbance: Secondary | ICD-10-CM

## 2019-09-07 DIAGNOSIS — E785 Hyperlipidemia, unspecified: Secondary | ICD-10-CM

## 2019-09-07 DIAGNOSIS — F339 Major depressive disorder, recurrent, unspecified: Secondary | ICD-10-CM

## 2019-09-07 DIAGNOSIS — N183 Chronic kidney disease, stage 3 unspecified: Secondary | ICD-10-CM

## 2019-09-07 DIAGNOSIS — G301 Alzheimer's disease with late onset: Secondary | ICD-10-CM

## 2019-09-07 DIAGNOSIS — E538 Deficiency of other specified B group vitamins: Secondary | ICD-10-CM | POA: Diagnosis not present

## 2019-09-07 NOTE — Progress Notes (Signed)
Location:  [place] of Service:  Clinic (12)  Provider:   Code Status:  Goals of Care:  Advanced Directives 02/08/2019  Does Patient Have a Medical Advance Directive? Yes  Type of Paramedic of [place];Living will  Does patient want to make changes to medical advance directive? No - Patient declined  Copy of [place] in Chart? Yes - validated most recent copy scanned in chart (See row information)     Chief Complaint  Patient presents with  . Medical Management of Chronic Issues    HPI: Patient is a 85 y.o. female seen today for medical management of chronic diseases.     Hyperlipidemia Daughter does not want Statin therapy Depression Daughter is really concerned about her Depression. But patient seems to be doing well with her weight and her mood. Dementia Sees Dr Jannifer Franklin He increased her Exelon dose but daughter wants to wait for few months before doing that Unsteady Gait Walks with her walker and has not had any Falls B12 Def On Supplements  Patient lives by herself in [place] Has 2 daughters who are very involved in her care. She also has caregivers every day.  She does not need any help with her ADLs but is dependent on her daughters for her IADLs  Past Medical History:  Diagnosis Date  . BPV (benign positional vertigo)   . Cancer [facility])    uterine cancer  . Carpal tunnel syndrome on left 06/02/2017  . Cognitive changes   . Depression   . Droopy eyelid, right   . 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 Dyslipidemia   . Fibromyalgia   . Granuloma annulare   . History of uterine cancer 2003   treated with hysterectomy  . Lumbar radicular syndrome    left L5  . Macular degeneration    left eye  . Sciatica   . TIA (transient ischemic attack)     Past Surgical History:  Procedure Laterality Date  . ABDOMINAL HYSTERECTOMY    . BREAST BIOPSY  2007  . CATARACT EXTRACTION    . TONSILLECTOMY       Allergies  Allergen Reactions  . Aricept [Donepezil Hcl]     Muscle cramps  . Latex Itching  . Septra [Sulfamethoxazole-Trimethoprim]   . Tramadol Other (See Comments)    Pt can't remember what side effects she had, she doesn't take it now    Outpatient Encounter Medications as of 09/07/2019  Medication Sig  . CALCIUM-VITAMIN D PO Take 1 tablet by mouth daily. 600mg  calcium, 800 units Vit D  . Cyanocobalamin 5000 MCG CAPS Take by mouth.  Marland Kitchen guaiFENesin (ROBITUSSIN) 100 MG/5ML liquid Take 10 mLs (200 mg total) by mouth 3 (three) times daily as needed for cough.  . memantine (NAMENDA) 10 MG tablet TAKE 1 TABLET BY MOUTH TWICE DAILY.  . mirtazapine (REMERON) 30 MG tablet TAKE ONE TABLET AT BEDTIME.  . Multiple Vitamins-Minerals (CENTRUM SILVER PO) Take 1 tablet by mouth daily.  . Multiple Vitamins-Minerals (PRESERVISION AREDS PO) Take 2 capsules by mouth daily.   . rivastigmine (EXELON) 1.5 MG capsule Take 1 capsule (1.5 mg total) by mouth 2 (two) times daily.  . sertraline (ZOLOFT) 25 MG tablet Take two tablets by mouth once daily  . UNABLE TO FIND Med Name: Hervey Ard Thought 1 capsule twice daily  . [DISCONTINUED] ASPIRIN 81 PO Take 1 tablet by mouth daily.  . [DISCONTINUED] loratadine (CLARITIN) 10 MG tablet Take 1  tablet (10 mg total) by mouth daily for 14 days.  . [DISCONTINUED] NON FORMULARY Prevision   No facility-administered encounter medications on file as of 09/07/2019.     Review of Systems:  Review of Systems  Review of Systems  Constitutional: Negative for activity change, appetite change, chills, diaphoresis, fatigue and fever.  HENT: Negative for mouth sores, postnasal drip, rhinorrhea, sinus pain and sore throat.   Respiratory: Negative for apnea, cough, chest tightness, shortness of breath and wheezing.   Cardiovascular: Negative for chest pain, palpitations and leg swelling.  Gastrointestinal: Negative for abdominal distention, abdominal pain, constipation, diarrhea,  nausea and vomiting.  Genitourinary: Negative for dysuria and frequency.  Musculoskeletal: Negative for arthralgias, joint swelling and myalgias.  Skin: Negative for rash.  Neurological: Negative for dizziness, syncope, weakness, light-headedness and numbness.  Psychiatric/Behavioral: Negative for behavioral problems, confusion and sleep disturbance.     Health Maintenance  Topic Date Due  . DEXA SCAN  06/22/1999  . INFLUENZA VACCINE  07/16/2019  . TETANUS/TDAP  06/07/2020  . PNA vac Low Risk Adult  Completed    Physical Exam: Vitals:   09/07/19 1519  BP: 138/64  Pulse: 91  Resp: 18  Temp: (!) 97.1 F (36.2 C)  SpO2: 97%  Weight: 127 lb 3.2 oz (57.7 kg)  Height: 5\' 4"  (1.626 m)   Body mass index is 21.83 kg/m. Physical Exam  Constitutional:  Well-developed and well-nourished.  HENT:  Head: Normocephalic.  Mouth/Throat: Oropharynx is clear and moist.  Eyes: Pupils are equal, round, and reactive to light.  Neck: Neck supple.  Cardiovascular: Normal rate and normal heart sounds.  No murmur heard. Pulmonary/Chest: Effort normal and breath sounds normal. No respiratory distress. No wheezes. She has no rales.  Abdominal: Soft. Bowel sounds are normal. No distension. There is no tenderness. There is no rebound.  Musculoskeletal: No edema.  Lymphadenopathy: none Neurological:No Focal Deficits. Gait is mildly unsteady without the Cane Skin: Skin is warm and dry.  Psychiatric: Normal mood and affect. Behavior is normal. Thought content normal.    Labs reviewed: Basic Metabolic Panel: Recent Labs    12/22/18 0000 08/01/19 0819  NA 137 137  K 4.6 4.4  CL 104 103  CO2 25 28  GLUCOSE 97 92  BUN 23 25  CREATININE 1.09* 1.07*  CALCIUM 9.0 9.3  TSH 3.47  --    Liver Function Tests: Recent Labs    12/22/18 0000 08/01/19 0819  AST 17 17  ALT 15 13  BILITOT 0.3 0.6  PROT 6.4 6.4   No results for input(s): LIPASE, AMYLASE in the last 8760 hours. No results for  input(s): AMMONIA in the last 8760 hours. CBC: Recent Labs    12/22/18 0000 08/01/19 0819  WBC 6.2 5.0  NEUTROABS 4,067 2,950  HGB 12.4 12.4  HCT 36.1 36.0  MCV 94.3 95.0  PLT 239 227   Lipid Panel: Recent Labs    12/22/18 0000 08/01/19 0819  CHOL 187 194  HDL 41* 41*  LDLCALC 130* 140*  TRIG 66 44  CHOLHDL 4.6 4.7   No results found for: HGBA1C  Procedures since last visit: No results found.  Assessment/Plan  Hyperlipidemia, unspecified hyperlipidemia type No Treatment per Daughter request  Depression, recurrent ([place]) Stable on Zoloft and Remeron Weight is stable Reassured daughter  B12 deficiency Will check the level  Continue supplement  CKD (chronic kidney disease) stage 3, GFR 30-59 ml/min (HCC) Creat Stable Repeat Labs  Late onset Alzheimer's disease without behavioral disturbance (  [place]) Follows with Dr Jannifer Franklin On Exelon and Namenda Has help from Caregiver Vaccination Upto date Shingrix in 2008 Schedule for Flu shot Daughter wants to wait for DEXA scan   Labs/tests ordered:  * No order type specified * Next appt:  Visit date not found  Total time spent in this patient care encounter was  25_  minutes; greater than 50% of the visit spent counseling patient and staff, reviewing records , Labs and coordinating care for problems addressed at this encounter.

## 2019-09-07 NOTE — Telephone Encounter (Signed)
Discussed with the daughter

## 2019-09-07 NOTE — Telephone Encounter (Signed)
Patient daughter, [name] called and stated that her mother has an appointment with Dr. Lyndel Safe today at [facility]. Daughter wants Dr. Lyndel Safe to give her a call after she see's patient to discuss concern and appointment.  Please Call [phone]

## 2019-09-22 ENCOUNTER — Other Ambulatory Visit: Payer: Self-pay | Admitting: Internal Medicine

## 2019-10-26 ENCOUNTER — Encounter: Payer: Self-pay | Admitting: Internal Medicine

## 2019-10-26 ENCOUNTER — Other Ambulatory Visit: Payer: Self-pay | Admitting: Neurology

## 2019-10-26 NOTE — Telephone Encounter (Signed)
Message routed to Gupta, Anjali L, MD  °

## 2019-11-02 ENCOUNTER — Other Ambulatory Visit: Payer: Self-pay | Admitting: Internal Medicine

## 2019-11-03 NOTE — Telephone Encounter (Signed)
High risk or very high risk warning populated when attempting to refill medication. RX request sent to PCP for review and approval if warranted.   

## 2019-12-14 ENCOUNTER — Other Ambulatory Visit: Payer: Self-pay | Admitting: Internal Medicine

## 2019-12-14 NOTE — Telephone Encounter (Signed)
Refill request, pend prescription and sent to Dr.Gupta due to high alert warning.

## 2020-01-04 ENCOUNTER — Encounter: Payer: Self-pay | Admitting: Internal Medicine

## 2020-01-04 NOTE — Telephone Encounter (Signed)
I returned the call to the patient's daughter.  She is aware the form have been completed, signed and faxed back.

## 2020-01-05 ENCOUNTER — Other Ambulatory Visit: Payer: Self-pay | Admitting: Internal Medicine

## 2020-01-05 MED ORDER — SERTRALINE HCL 50 MG PO TABS: 50.0000 mg | ORAL_TABLET | Freq: Every day | ORAL | 3 refills | Status: AC

## 2020-01-16 ENCOUNTER — Other Ambulatory Visit: Payer: Self-pay | Admitting: Neurology

## 2020-02-16 ENCOUNTER — Ambulatory Visit: Payer: Medicare Other | Admitting: Neurology

## 2020-02-23 ENCOUNTER — Other Ambulatory Visit: Payer: Self-pay | Admitting: Neurology

## 2020-02-27 ENCOUNTER — Other Ambulatory Visit: Payer: Self-pay

## 2020-02-27 MED ORDER — RIVASTIGMINE TARTRATE 1.5 MG PO CAPS: 1.5000 mg | ORAL_CAPSULE | Freq: Two times a day (BID) | ORAL | 12 refills | Status: DC

## 2020-03-04 ENCOUNTER — Encounter: Payer: Self-pay | Admitting: Internal Medicine

## 2020-03-05 ENCOUNTER — Other Ambulatory Visit: Payer: Self-pay

## 2020-03-05 DIAGNOSIS — N183 Chronic kidney disease, stage 3 unspecified: Secondary | ICD-10-CM

## 2020-03-05 DIAGNOSIS — E538 Deficiency of other specified B group vitamins: Secondary | ICD-10-CM

## 2020-03-05 LAB — CBC WITH DIFFERENTIAL/PLATELET
Absolute Monocytes: 537 cells/uL (ref 200–950)
Basophils Absolute: 31 cells/uL (ref 0–200)
Basophils Relative: 0.5 %
Eosinophils Absolute: 189 cells/uL (ref 15–500)
Eosinophils Relative: 3.1 %
HCT: 38.7 % (ref 35.0–45.0)
Hemoglobin: 13.1 g/dL (ref 11.7–15.5)
Lymphs Abs: 1610 cells/uL (ref 850–3900)
MCH: 32 pg (ref 27.0–33.0)
MCHC: 33.9 g/dL (ref 32.0–36.0)
MCV: 94.4 fL (ref 80.0–100.0)
MPV: 9.1 fL (ref 7.5–12.5)
Monocytes Relative: 8.8 %
Neutro Abs: 3733 cells/uL (ref 1500–7800)
Neutrophils Relative %: 61.2 %
Platelets: 260 10*3/uL (ref 140–400)
RBC: 4.1 10*6/uL (ref 3.80–5.10)
RDW: 12.2 % (ref 11.0–15.0)
Total Lymphocyte: 26.4 %
WBC: 6.1 10*3/uL (ref 3.8–10.8)

## 2020-03-05 LAB — COMPLETE METABOLIC PANEL WITH GFR
AG Ratio: 1.7 (calc) (ref 1.0–2.5)
ALT: 13 U/L (ref 6–29)
AST: 20 U/L (ref 10–35)
Albumin: 4.4 g/dL (ref 3.6–5.1)
Alkaline phosphatase (APISO): 45 U/L (ref 37–153)
BUN/Creatinine Ratio: 19 (calc) (ref 6–22)
BUN: 22 mg/dL (ref 7–25)
CO2: 26 mmol/L (ref 20–32)
Calcium: 9.4 mg/dL (ref 8.6–10.4)
Chloride: 103 mmol/L (ref 98–110)
Creat: 1.15 mg/dL — ABNORMAL HIGH (ref 0.60–0.88)
GFR, Est African American: 50 mL/min/{1.73_m2} — ABNORMAL LOW (ref >=60)
GFR, Est Non African American: 43 mL/min/{1.73_m2} — ABNORMAL LOW (ref >=60)
Globulin: 2.6 g/dL (calc) (ref 1.9–3.7)
Glucose, Bld: 94 mg/dL (ref 65–99)
Potassium: 4.5 mmol/L (ref 3.5–5.3)
Sodium: 140 mmol/L (ref 135–146)
Total Bilirubin: 0.5 mg/dL (ref 0.2–1.2)
Total Protein: 7 g/dL (ref 6.1–8.1)

## 2020-03-05 LAB — VITAMIN B12: Vitamin B-12: 1010 pg/mL (ref 200–1100)

## 2020-03-07 ENCOUNTER — Encounter: Payer: Self-pay | Admitting: Internal Medicine

## 2020-03-07 ENCOUNTER — Non-Acute Institutional Stay: Payer: Medicare Other | Admitting: Internal Medicine

## 2020-03-07 ENCOUNTER — Other Ambulatory Visit: Payer: Self-pay

## 2020-03-07 VITALS — BP 122/70 | HR 84 | Temp 98.1°F | Ht 64.0 in | Wt 124.8 lb

## 2020-03-07 DIAGNOSIS — N1831 Chronic kidney disease, stage 3a: Secondary | ICD-10-CM | POA: Diagnosis not present

## 2020-03-07 DIAGNOSIS — E785 Hyperlipidemia, unspecified: Secondary | ICD-10-CM | POA: Diagnosis not present

## 2020-03-07 DIAGNOSIS — F0280 Dementia in other diseases classified elsewhere without behavioral disturbance: Secondary | ICD-10-CM

## 2020-03-07 DIAGNOSIS — G301 Alzheimer's disease with late onset: Secondary | ICD-10-CM | POA: Diagnosis not present

## 2020-03-07 DIAGNOSIS — F339 Major depressive disorder, recurrent, unspecified: Secondary | ICD-10-CM | POA: Diagnosis not present

## 2020-03-07 NOTE — Progress Notes (Addendum)
Location:  [place] of Service:  Clinic (12)  Provider:   Code Status:  Goals of Care:  Advanced Directives 03/07/2020  Does Patient Have a Medical Advance Directive? Yes  Type of Advance Directive Living will;Healthcare Power of [place];Out of facility DNR (pink MOST or yellow form)  Does patient want to make changes to medical advance directive? No - Patient declined  Copy of [place] in Chart? Yes - validated most recent copy scanned in chart (See row information)  Pre-existing out of facility DNR order (yellow form or pink MOST form) Pink MOST form placed in chart (order not valid for inpatient use)     Chief Complaint  Patient presents with  . Medical Management of Chronic Issues    Patient has no concerns. Patient unable to confirm med list.     HPI: Patient is a 86 y.o. female seen today for medical management of chronic diseases.    Hyperlipidemia Has discussed Statin Therapy. Daughter wants to continue to  Follow Lipid Panel. Refuses for therapy right now Depression Seems stable on Remeron and Zoloft Dementia On Exelon Patch and Namenda Follows with Dr Jannet Askew Gait Walks with the walker has not had any falls.  Lives by herself in [place]. Has 2 daughters who live in [place] Has caregiver who comes few hours a day She is Independent in her ADLS. IALDLS managed by her daughters  Past Medical History:  Diagnosis Date  . BPV (benign positional vertigo)   . Cancer [facility])    uterine cancer  . Carpal tunnel syndrome on left 06/02/2017  . Cognitive changes   . Depression   . Droopy eyelid, right   . 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 Dyslipidemia   . Fibromyalgia   . Granuloma annulare   . History of uterine cancer 2003   treated with hysterectomy  . Lumbar radicular syndrome    left L5  . Macular degeneration    left eye  . Sciatica   . TIA (transient ischemic attack)     Past Surgical History:   Procedure Laterality Date  . ABDOMINAL HYSTERECTOMY    . BREAST BIOPSY  2007  . CATARACT EXTRACTION    . TONSILLECTOMY      Allergies  Allergen Reactions  . Aricept [Donepezil Hcl]     Muscle cramps  . Latex Itching  . Septra [Sulfamethoxazole-Trimethoprim]   . Tramadol Other (See Comments)    Pt can't remember what side effects she had, she doesn't take it now    Outpatient Encounter Medications as of 03/07/2020  Medication Sig  . CALCIUM-VITAMIN D PO Take 1 tablet by mouth daily. 600mg  calcium, 800 units Vit D  . Cyanocobalamin 5000 MCG CAPS Take by mouth.  Marland Kitchen guaiFENesin (ROBITUSSIN) 100 MG/5ML liquid Take 10 mLs (200 mg total) by mouth 3 (three) times daily as needed for cough.  . memantine (NAMENDA) 10 MG tablet TAKE 1 TABLET BY MOUTH TWICE DAILY.  . mirtazapine (REMERON) 30 MG tablet TAKE ONE TABLET AT BEDTIME.  . Multiple Vitamins-Minerals (CENTRUM SILVER PO) Take 1 tablet by mouth daily.  . Multiple Vitamins-Minerals (PRESERVISION AREDS PO) Take 2 capsules by mouth daily.   . rivastigmine (EXELON) 1.5 MG capsule Take 1 capsule (1.5 mg total) by mouth 2 (two) times daily.  . sertraline (ZOLOFT) 50 MG tablet Take 1 tablet (50 mg total) by mouth daily.  Marland Kitchen UNABLE TO FIND Med Name: Hervey Ard Thought 1 capsule twice  daily   No facility-administered encounter medications on file as of 03/07/2020.    Review of Systems:  Review of Systems  Review of Systems  Constitutional: Negative for activity change, appetite change, chills, diaphoresis, fatigue and fever.  HENT: Negative for mouth sores, postnasal drip, rhinorrhea, sinus pain and sore throat.   Respiratory: Negative for apnea, cough, chest tightness, shortness of breath and wheezing.   Cardiovascular: Negative for chest pain, palpitations and leg swelling.  Gastrointestinal: Negative for abdominal distention, abdominal pain, constipation, diarrhea, nausea and vomiting.  Genitourinary: Negative for dysuria and frequency.   Musculoskeletal: Negative for arthralgias, joint swelling and myalgias.  Skin: Negative for rash.  Neurological: Negative for dizziness, syncope, weakness, light-headedness and numbness.  Psychiatric/Behavioral: Negative for behavioral problems, confusion and sleep disturbance.     Health Maintenance  Topic Date Due  . DEXA SCAN  Never done  . TETANUS/TDAP  06/07/2020  . INFLUENZA VACCINE  Completed  . PNA vac Low Risk Adult  Completed    Physical Exam: Vitals:   03/07/20 1253  BP: 122/70  Pulse: 84  Temp: 98.1 F (36.7 C)  SpO2: 95%  Weight: 124 lb 12.8 oz (56.6 kg)  Height: 5\' 4"  (1.626 m)   Body mass index is 21.42 kg/m. Physical Exam  Constitutional: Oriented to person, place, and time. Well-developed and well-nourished.  HENT:  Head: Normocephalic.  Mouth/Throat: Oropharynx is clear and moist.  Eyes: Pupils are equal, round, and reactive to light.  Ears No Wax TM Normal Neck: Neck supple.  Cardiovascular: Normal rate and normal heart sounds.  No murmur heard. Pulmonary/Chest: Effort normal and breath sounds normal. No respiratory distress. No wheezes. She has no rales.  Abdominal: Soft. Bowel sounds are normal. No distension. There is no tenderness. There is no rebound.  Musculoskeletal: No edema.  Lymphadenopathy: none Neurological: Alert and oriented to person, place, and time.  No Focal deficits Walks with the CAne Skin: Skin is warm and dry.  Psychiatric: Normal mood and affect. Behavior is normal. Thought content normal.    Labs reviewed: Basic Metabolic Panel: Recent Labs    08/01/19 0819 03/02/20 0829  NA 137 140  K 4.4 4.5  CL 103 103  CO2 28 26  GLUCOSE 92 94  BUN 25 22  CREATININE 1.07* 1.15*  CALCIUM 9.3 9.4   Liver Function Tests: Recent Labs    08/01/19 0819 03/02/20 0829  AST 17 20  ALT 13 13  BILITOT 0.6 0.5  PROT 6.4 7.0   No results for input(s): LIPASE, AMYLASE in the last 8760 hours. No results for input(s): AMMONIA  in the last 8760 hours. CBC: Recent Labs    08/01/19 0819 03/02/20 0829  WBC 5.0 6.1  NEUTROABS 2,950 3,733  HGB 12.4 13.1  HCT 36.0 38.7  MCV 95.0 94.4  PLT 227 260   Lipid Panel: Recent Labs    08/01/19 0819  CHOL 194  HDL 41*  LDLCALC 140*  TRIG 44  CHOLHDL 4.7   No results found for: HGBA1C  Procedures since last visit: No results found.  Assessment/Plan 1. Hyperlipidemia, unspecified hyperlipidemia type - Lipid panel; Future Has refused statins before  2. Depression, recurrent ([place]) Seems stable on Remeron and Zoloft  3. Stage 3a chronic kidney disease  - CBC with Differential/Platelet; Future - COMPLETE METABOLIC PANEL WITH GFR; Future - Vitamin D, 25-hydroxy; Future - Lipid panel; Future -    DG DXA FRACTURE ASSESSMENT; Future 4. Late onset Alzheimer's disease without behavioral disturbance ([place])  Follows with Dr Jannifer Franklin Doing well on Exelon and Namenda  Shingrix in 2008 Labs/tests ordered:  * No order type specified * Next appt:  08/30/2020  D/W the Daughter on The Phone

## 2020-03-08 ENCOUNTER — Encounter: Payer: Self-pay | Admitting: Internal Medicine

## 2020-03-08 NOTE — Addendum Note (Signed)
Addended by: Georgina Snell on: 03/08/2020 06:45 PM   Modules accepted: Orders

## 2020-03-15 ENCOUNTER — Other Ambulatory Visit: Payer: Self-pay | Admitting: Internal Medicine

## 2020-03-15 DIAGNOSIS — N1831 Chronic kidney disease, stage 3a: Secondary | ICD-10-CM

## 2020-03-23 ENCOUNTER — Other Ambulatory Visit: Payer: Self-pay | Admitting: Internal Medicine

## 2020-04-17 ENCOUNTER — Telehealth: Payer: Self-pay

## 2020-04-17 NOTE — Telephone Encounter (Signed)
Receive message that pts daughter  Eustaquio Maize wants to see Dr. Ulyses Jarred for follow up appt. Dr. Jannifer Franklin sent daughter a mychart message stating if someone cancel pt can be r/s with him. PT daughter  stated that pt has never seen Judson Roch NP and she prefers Dr. Christella Scheuermann. I explain to [place] that Judson Roch is Dr. Jannifer Franklin NP and they work together on discussing pts medical conditions. I stated he is part time and schedule is limited. The daughter wanted me to schedule with Dr. Jannifer Franklin on May 14, and keep the evening appt with Judson Roch NP in the evening. I explain pt cannot have two appts schedule in the same week and we cant hold it. The daughter stated she will be coming from [place] but will make it work. I stated check in time at 0915, no kids, mask required and one visitor. Beth pts daughter verbalized understanding.

## 2020-04-20 ENCOUNTER — Other Ambulatory Visit: Payer: Self-pay | Admitting: Neurology

## 2020-04-24 ENCOUNTER — Ambulatory Visit: Payer: Medicare Other | Admitting: Neurology

## 2020-04-27 ENCOUNTER — Other Ambulatory Visit: Payer: Self-pay

## 2020-04-27 ENCOUNTER — Ambulatory Visit (INDEPENDENT_AMBULATORY_CARE_PROVIDER_SITE_OTHER): Payer: Medicare Other | Admitting: Neurology

## 2020-04-27 ENCOUNTER — Encounter: Payer: Self-pay | Admitting: Neurology

## 2020-04-27 VITALS — BP 161/77 | HR 78 | Ht 62.5 in | Wt 124.0 lb

## 2020-04-27 DIAGNOSIS — R413 Other amnesia: Secondary | ICD-10-CM | POA: Diagnosis not present

## 2020-04-27 DIAGNOSIS — R29898 Other symptoms and signs involving the musculoskeletal system: Secondary | ICD-10-CM

## 2020-04-27 MED ORDER — RIVASTIGMINE TARTRATE 1.5 MG PO CAPS: 1.5000 mg | ORAL_CAPSULE | Freq: Two times a day (BID) | ORAL | 3 refills | Status: DC

## 2020-04-27 NOTE — Progress Notes (Signed)
Reason for visit: Dementia  Janet [name] is an 86 y.o. female  History of present illness:  Janet [name] is an 86 year old left-handed white female with a history of a progressive memory disturbance consistent with dementia.  She lives at [facility], she has assistance coming in from 9:30 AM to 2 PM most days of the week and from 7:30 PM to 10 PM in the evenings.  The patient has had significant worsening of her short-term memory, she can only remember things for about 30 or 40 seconds.  She is unable to tolerate doses of Exelon greater than 1.5 mg twice daily secondary to nausea.  She is on Namenda 10 mg twice daily.  She is sleeping well, she has had some trouble getting in a proper diet, she may not eat at the dining room frequently, at times she may lose weight.  The patient is having increasing problems with word finding.  She needs to be reminded to take a bath or wash her hair.  She has somebody monitoring her medication intake, she does have a pill dispenser.  She returns to the office today for an evaluation.  Past Medical History:  Diagnosis Date  . BPV (benign positional vertigo)   . Cancer [facility])    uterine cancer  . Carpal tunnel syndrome on left 06/02/2017  . Cognitive changes   . Depression   . Droopy eyelid, right   . 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 Dyslipidemia   . Fibromyalgia   . Granuloma annulare   . History of uterine cancer 2003   treated with hysterectomy  . Lumbar radicular syndrome    left L5  . Macular degeneration    left eye  . Sciatica   . TIA (transient ischemic attack)     Past Surgical History:  Procedure Laterality Date  . ABDOMINAL HYSTERECTOMY    . BREAST BIOPSY  2007  . CATARACT EXTRACTION    . TONSILLECTOMY      Family History  Problem Relation Age of Onset  . Depression Mother   . Diabetes Father   . Heart attack Father   . Dementia Sister   . Pneumonia Brother   . Breast cancer Daughter     Social history:  reports that  she has never smoked. She has never used smokeless tobacco. She reports that she does not drink alcohol or use drugs.    Allergies  Allergen Reactions  . Aricept [Donepezil Hcl]     Muscle cramps  . Latex Itching  . Septra [Sulfamethoxazole-Trimethoprim]   . Tramadol Other (See Comments)    Pt can't remember what side effects she had, she doesn't take it now    Medications:  Prior to Admission medications   Medication Sig Start Date End Date Taking? Authorizing Provider  aspirin EC 81 MG tablet Take 81 mg by mouth daily.   Yes [provider]  CALCIUM-VITAMIN D PO Take 1 tablet by mouth daily. 600mg  calcium, 800 units Vit D   Yes [provider]  melatonin 3 MG TABS tablet Take 3 mg by mouth at bedtime.   Yes [provider]  memantine (NAMENDA) 10 MG tablet TAKE 1 TABLET BY MOUTH TWICE DAILY. 04/23/20  Yes Kathrynn Ducking, MD  mirtazapine (REMERON) 30 MG tablet TAKE ONE TABLET AT BEDTIME. 03/23/20  Yes [name], MD  Multiple Vitamins-Minerals (CENTRUM SILVER PO) Take 1 tablet by mouth daily.   Yes [provider]  Multiple Vitamins-Minerals (PRESERVISION  AREDS PO) Take 2 capsules by mouth daily.    Yes [provider]  Probiotic Product (PROBIOTIC PO) Take by mouth.   Yes [provider]  rivastigmine (EXELON) 1.5 MG capsule Take 1 capsule (1.5 mg total) by mouth 2 (two) times daily. 02/27/20  Yes Kathrynn Ducking, MD  sertraline (ZOLOFT) 50 MG tablet Take 1 tablet (50 mg total) by mouth daily. 01/05/20  Yes [name], MD  UNABLE TO FIND Med Name: Hervey Ard Thought 1 capsule twice daily   Yes [provider]  vitamin B-12 (CYANOCOBALAMIN) 500 MCG tablet Take 500 mcg by mouth daily.   Yes [provider]    ROS:  Out of a complete 14 system review of symptoms, the patient complains only of the following symptoms, and all other reviewed systems are negative.  Memory troubles Word finding problems  Blood  pressure (!) 161/77, pulse 78, height 5' 2.5" (1.588 m), weight 124 lb (56.2 kg).  Physical Exam  General: The patient is alert and cooperative at the time of the examination.  Skin: No significant peripheral edema is noted.   Neurologic Exam  Mental status: The patient is alert and oriented x 3 at the time of the examination. The Mini-Mental status examination done today shows a total score of 15/30.   Cranial nerves: Facial symmetry is present. Speech is normal, no aphasia or dysarthria is noted. Extraocular movements are full. Visual fields are full.  Motor: The patient has good strength in all 4 extremities.  Sensory examination: Soft touch sensation is symmetric on the face, arms, and legs.  Coordination: The patient has good finger-nose-finger and heel-to-shin bilaterally.  Gait and station: The patient has a normal gait, the patient does have a cane for ambulation.  Romberg is negative. No drift is seen.  Reflexes: Deep tendon reflexes are symmetric.   MRI brain 11/27/16:  IMPRESSION:  Abnormal MRI brain (without) demonstrating: 1. Mild diffuse, moderate perisylvian and severe mesial temporal atrophy. 2. Mild periventricular and subcortical chronic small vessel ischemic disease.  3. Right thalamic chronic lacunar ischemic infarction. 4. No acute findings. 5. Compared to CT from 05/21/07, the atrophy and chronic right thalamic infarct are new findings.  * MRI scan images were reviewed online. I agree with the written report.    Assessment/Plan:  1.  Dementia, memory disturbance  2.  Dropped head syndrome  The patient continues to progress with her memory, she is living in an extended care facility but has assistance in her apartment.  She does not operate a motor vehicle.  She will continue on the Namenda and the Exelon, a prescription for the Exelon was sent in.  She will follow-up here in 6 months.   Greater than 50% of the visit was spent in counseling and  coordination of care.  Face-to-face time with the patient was 25 minutes.   Jill Alexanders MD 04/27/2020 10:32 AM  [facility] [address] [place], [place] [address]  Phone [phone] Fax [phone]

## 2020-05-15 ENCOUNTER — Ambulatory Visit: Payer: Self-pay | Admitting: Neurology

## 2020-05-31 ENCOUNTER — Other Ambulatory Visit: Payer: Self-pay | Admitting: Internal Medicine

## 2020-06-01 NOTE — Telephone Encounter (Signed)
Received Refill from pharmacy Pended Rx and sent to Dr. Lyndel Safe for approval due to [place] Warning.

## 2020-06-05 ENCOUNTER — Other Ambulatory Visit: Payer: Self-pay | Admitting: Internal Medicine

## 2020-06-05 DIAGNOSIS — N1831 Chronic kidney disease, stage 3a: Secondary | ICD-10-CM

## 2020-06-07 ENCOUNTER — Other Ambulatory Visit: Payer: Medicare Other

## 2020-06-28 ENCOUNTER — Encounter: Payer: Self-pay | Admitting: Internal Medicine

## 2020-06-29 ENCOUNTER — Encounter: Payer: Self-pay | Admitting: Internal Medicine

## 2020-07-17 ENCOUNTER — Non-Acute Institutional Stay: Payer: Medicare Other | Admitting: Nurse Practitioner

## 2020-07-17 ENCOUNTER — Encounter: Payer: Self-pay | Admitting: Nurse Practitioner

## 2020-07-17 DIAGNOSIS — E559 Vitamin D deficiency, unspecified: Secondary | ICD-10-CM | POA: Insufficient documentation

## 2020-07-17 DIAGNOSIS — E538 Deficiency of other specified B group vitamins: Secondary | ICD-10-CM | POA: Diagnosis not present

## 2020-07-17 DIAGNOSIS — N1832 Chronic kidney disease, stage 3b: Secondary | ICD-10-CM | POA: Diagnosis not present

## 2020-07-17 DIAGNOSIS — E782 Mixed hyperlipidemia: Secondary | ICD-10-CM

## 2020-07-17 DIAGNOSIS — F339 Major depressive disorder, recurrent, unspecified: Secondary | ICD-10-CM | POA: Diagnosis not present

## 2020-07-17 DIAGNOSIS — N183 Chronic kidney disease, stage 3 unspecified: Secondary | ICD-10-CM | POA: Insufficient documentation

## 2020-07-17 DIAGNOSIS — N1831 Chronic kidney disease, stage 3a: Secondary | ICD-10-CM | POA: Insufficient documentation

## 2020-07-17 NOTE — Assessment & Plan Note (Addendum)
Continue Vit B12, update Vit B12 level, CBC/diff

## 2020-07-17 NOTE — Assessment & Plan Note (Signed)
03/02/20 eGFR 43, creat 1.15, update CMP/eGFR

## 2020-07-17 NOTE — Assessment & Plan Note (Signed)
Continue daily Vit D, Ca, update Vit D

## 2020-07-17 NOTE — Assessment & Plan Note (Signed)
LDL 140 07/2019, diet, update lipid panel.

## 2020-07-17 NOTE — Progress Notes (Signed)
Location:   [facility] Room Number: [place] of Service:  ALF [phone]) Provider:  Marda Stalker, Lennie Odor NP   [name], MD  Patient Care Team: [name], MD as PCP - General (Internal Medicine) [name], MD as Referring Physician (Physical Medicine and Rehabilitation) Lajean Manes, MD as Consulting Physician (Internal Medicine)  Extended Emergency Contact Information Primary Emergency Contact: [name], [place] of [place] Phone: [phone] Relation: Daughter Secondary Emergency Contact: Myers,Lois  [place] Phone: [phone] Relation: Daughter  Code Status:  DNR Goals of care: Advanced Directive information Advanced Directives 03/07/2020  Does Patient Have a Medical Advance Directive? Yes  Type of Advance Directive Living will;Healthcare Power of [place];Out of facility DNR (pink MOST or yellow form)  Does patient want to make changes to medical advance directive? No - Patient declined  Copy of [place] in Chart? Yes - validated most recent copy scanned in chart (See row information)  Pre-existing out of facility DNR order (yellow form or pink MOST form) Pink MOST form placed in chart (order not valid for inpatient use)     Chief Complaint  Patient presents with   Acute Visit    medication review    HPI:  Pt is a 86 y.o. female seen today for evaluation of chronic medical conditions  Hx of Hyperlipidemia, not taking statin, LDL 140 08/01/19, takes ASA 7m qd.   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   Dyslipidemia    Fibromyalgia    Granuloma annulare    History of uterine cancer 2003   treated with hysterectomy   Lumbar radicular syndrome    left L5   Macular degeneration    left eye   Sciatica    TIA (transient ischemic attack)    Past Surgical History:  Procedure Laterality Date   ABDOMINAL HYSTERECTOMY     BREAST BIOPSY  2007   CATARACT EXTRACTION     TONSILLECTOMY      Allergies  Allergen Reactions   Aricept [Donepezil Hcl]     Muscle cramps   Latex Itching   Septra [Sulfamethoxazole-Trimethoprim]    Tramadol Other (See Comments)    Pt can't remember what side effects she had, she doesn't take it now    Allergies as of 07/17/2020      Reactions   Aricept [donepezil Hcl]    Muscle cramps   Latex Itching   Septra [sulfamethoxazole-trimethoprim]    Tramadol Other (See Comments)   Pt can't remember what side effects she had, she doesn't take it now      Medication List       Accurate as of July 17, 2020  6:36 PM. If you have any questions, ask your nurse or doctor.        aspirin EC 81 MG tablet Take 81 mg by mouth daily.   CALCIUM-VITAMIN D PO Take 1 tablet by mouth daily. 6099mcalcium, 800 units Vit D   melatonin 3 MG Tabs  tablet Take 3 mg by mouth at bedtime.   memantine 10 MG tablet Commonly known as: NAMENDA TAKE 1 TABLET BY MOUTH TWICE DAILY.   mirtazapine 30 MG tablet Commonly known as: REMERON TAKE ONE TABLET AT BEDTIME.   CENTRUM SILVER PO Take 1 tablet by mouth daily.   PRESERVISION AREDS PO Take 2 capsules by mouth daily.   PROBIOTIC PO Take by mouth.   rivastigmine 1.5 MG capsule Commonly known as: EXELON Take 1 capsule (1.5 mg total) by mouth 2 (two) times daily.   sertraline 50 MG tablet Commonly known as: ZOLOFT Take 1 tablet (50 mg total) by mouth daily.   Tubersol 5 UNIT/0.1ML injection Generic drug: tuberculin Inject 5 Units into the skin once.   UNABLE TO FIND Med Name: Hervey Ard  Thought 1 capsule twice daily   vitamin B-12 500 MCG tablet Commonly known as: CYANOCOBALAMIN Take 500 mcg by mouth daily.       Review of Systems  Constitutional: Negative for activity change, appetite change, chills, diaphoresis, fatigue and fever.  HENT: Positive for hearing loss. Negative for congestion, trouble swallowing and voice change.   Eyes: Negative for visual disturbance.  Respiratory: Negative for cough, chest tightness, shortness of breath and wheezing.   Cardiovascular: Negative for chest pain, palpitations and leg swelling.  Gastrointestinal: Negative for abdominal distention, abdominal pain, constipation, diarrhea, nausea and vomiting.  Genitourinary: Negative for dysuria, frequency and urgency.  Musculoskeletal: Positive for gait problem.  Skin: Negative for color change and pallor.  Neurological: Negative for dizziness, speech difficulty and headaches.       Dementia  Psychiatric/Behavioral: Negative for agitation, behavioral problems, hallucinations and sleep disturbance. The patient is not nervous/anxious.     Immunization History  Administered Date(s) Administered   Influenza, High Dose Seasonal PF 09/23/2017, 09/28/2019   Influenza,inj,Quad PF,6+ Mos 09/16/2018   Influenza-Unspecified 09/14/2017   Moderna SARS-COVID-2 Vaccination 12/19/2019, 01/16/2020   Pertinent  Health Maintenance Due  Topic Date Due   DEXA SCAN  Never done   INFLUENZA VACCINE  07/15/2020   PNA vac Low Risk Adult  Completed   Fall Risk  03/07/2020 02/08/2019 12/29/2018 09/21/2018 05/01/2017  Falls in the past year? 0 0 0 No Yes  Number falls in past yr: 0 0 0 - 1  Injury with Fall? - 0 0 - No  Risk for fall due to : - - - - Impaired balance/gait   Functional Status Survey:    Vitals:   07/17/20 1552  BP: 122/66  Pulse: 80  Resp: 20  Temp: 97.9 F (36.6 C)  SpO2: 96%  Weight: 124 lb (56.2 kg)  Height: 5' 2.5" (1.588 m)   Body mass index is 22.32 kg/m. Physical  Exam Vitals and nursing note reviewed.  Constitutional:      General: She is not in acute distress.    Appearance: Normal appearance. She is not ill-appearing or toxic-appearing.  HENT:     Head: Normocephalic and atraumatic.     Nose: Nose normal.     Mouth/Throat:     Mouth: Mucous membranes are moist.  Eyes:     Extraocular Movements: Extraocular movements intact.     Conjunctiva/sclera: Conjunctivae normal.     Pupils: Pupils are equal, round, and reactive to light.  Cardiovascular:     Rate and Rhythm: Normal rate and regular rhythm.     Heart sounds: No murmur heard.   Pulmonary:     Effort: Pulmonary effort is normal.     Breath  sounds: No wheezing, rhonchi or rales.  Chest:     Chest wall: No tenderness.  Abdominal:     General: Bowel sounds are normal. There is no distension.     Palpations: Abdomen is soft.     Tenderness: There is no abdominal tenderness. There is no right CVA tenderness, left CVA tenderness, guarding or rebound.  Musculoskeletal:     Cervical back: Normal range of motion and neck supple.     Right lower leg: No edema.     Left lower leg: No edema.  Skin:    General: Skin is warm and dry.  Neurological:     General: No focal deficit present.     Mental Status: She is alert. Mental status is at baseline.     Motor: No weakness.     Coordination: Coordination normal.     Gait: Gait abnormal.     Comments: Oriented to person, place.   Psychiatric:        Mood and Affect: Mood normal.        Behavior: Behavior normal.     Labs reviewed: Recent Labs    08/01/19 0819 03/02/20 0829  NA 137 140  K 4.4 4.5  CL 103 103  CO2 28 26  GLUCOSE 92 94  BUN 25 22  CREATININE 1.07* 1.15*  CALCIUM 9.3 9.4   Recent Labs    08/01/19 0819 03/02/20 0829  AST 17 20  ALT 13 13  BILITOT 0.6 0.5  PROT 6.4 7.0   Recent Labs    08/01/19 0819 03/02/20 0829  WBC 5.0 6.1  NEUTROABS 2,950 3,733  HGB 12.4 13.1  HCT 36.0 38.7  MCV 95.0 94.4  PLT  227 260   Lab Results  Component Value Date   TSH 3.47 12/22/2018   No results found for: HGBA1C Lab Results  Component Value Date   CHOL 194 08/01/2019   HDL 41 (L) 08/01/2019   LDLCALC 140 (H) 08/01/2019   TRIG 44 08/01/2019   CHOLHDL 4.7 08/01/2019    Significant Diagnostic Results in last 30 days:  No results found.  Assessment/Plan Depression, recurrent ([place]) Her mood is stable, continue Mirtazapine, Sertraline. Update TSH  Hyperlipidemia LDL 140 07/2019, diet, update lipid panel.   Vitamin B12 deficiency Continue Vit B12, update Vit B12 level, CBC/diff  CKD (chronic kidney disease) stage 3, GFR 30-59 ml/min 03/02/20 eGFR 43, creat 1.15, update CMP/eGFR  Vitamin D deficiency Continue daily Vit D, Ca, update Vit D     Family/ staff Communication: plan of care reviewed with the patient and charge nurse.   Labs/tests ordered:  CBC/diff, CMP/eGFR, TSH, lipid panel, Vit D  Time spend 40 minutes.

## 2020-07-17 NOTE — Assessment & Plan Note (Addendum)
Her mood is stable, continue Mirtazapine, Sertraline. Update TSH

## 2020-08-14 ENCOUNTER — Non-Acute Institutional Stay: Payer: Medicare Other | Admitting: Internal Medicine

## 2020-08-14 ENCOUNTER — Encounter: Payer: Self-pay | Admitting: Internal Medicine

## 2020-08-14 DIAGNOSIS — G301 Alzheimer's disease with late onset: Secondary | ICD-10-CM

## 2020-08-14 DIAGNOSIS — E538 Deficiency of other specified B group vitamins: Secondary | ICD-10-CM | POA: Diagnosis not present

## 2020-08-14 DIAGNOSIS — F0280 Dementia in other diseases classified elsewhere without behavioral disturbance: Secondary | ICD-10-CM

## 2020-08-14 DIAGNOSIS — E782 Mixed hyperlipidemia: Secondary | ICD-10-CM | POA: Diagnosis not present

## 2020-08-14 DIAGNOSIS — N1831 Chronic kidney disease, stage 3a: Secondary | ICD-10-CM

## 2020-08-14 DIAGNOSIS — F339 Major depressive disorder, recurrent, unspecified: Secondary | ICD-10-CM | POA: Diagnosis not present

## 2020-08-14 NOTE — Progress Notes (Signed)
Location:   [facility] Room Number: [place] of Service:  ALF [phone]) Provider:  Veleta Miners MD  [name], MD  Patient Care Team: [name], MD as PCP - General (Internal Medicine) [name], MD as Referring Physician (Physical Medicine and Rehabilitation) Lajean Manes, MD as Consulting Physician (Internal Medicine)  Extended Emergency Contact Information Primary Emergency Contact: [name], [place] of [place] Phone: [phone] Relation: Daughter Secondary Emergency Contact: Myers,Lois  [place] Phone: [phone] Relation: Daughter  Code Status:  DNR Goals of care: Advanced Directive information Advanced Directives 08/14/2020  Does Patient Have a Medical Advance Directive? Yes  Type of Advance Directive Out of facility DNR (pink MOST or yellow form);Living will;Healthcare Power of Attorney  Does patient want to make changes to medical advance directive? No - Patient declined  Copy of [place] in Chart? Yes - validated most recent copy scanned in chart (See row information)  Pre-existing out of facility DNR order (yellow form or pink MOST form) Pink MOST form placed in chart (order not valid for inpatient use)     Chief Complaint  Patient presents with  . Medical Management of Chronic Issues  . Health Maintenance    TDAP    HPI:  Pt is a 86 y.o. female seen today for medical management of chronic diseases.   Has h/o hyperlipidemia, depression, dementia and unsteady gait  Patient is recently moved to assisted living.  Was living by herself in [place].  The daughter says that she needed more help. She  has adjusted well to the facility.  The nurses have no new issues. Patient walks with a walker and has had no falls.  Her mood is stable her weight is stable.  Past Medical History:  Diagnosis Date  . BPV (benign positional vertigo)   . Cancer [facility])      uterine cancer  . Carpal tunnel syndrome on left 06/02/2017  . Cognitive changes   . Depression   . Droopy eyelid, right   . 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 Dyslipidemia   . Fibromyalgia   . Granuloma annulare   . History of uterine cancer 2003   treated with hysterectomy  . Lumbar radicular syndrome    left L5  . Macular degeneration    left eye  . Sciatica   . TIA (transient ischemic attack)    Past Surgical History:  Procedure Laterality Date  . ABDOMINAL HYSTERECTOMY    . BREAST BIOPSY  2007  . CATARACT EXTRACTION    . TONSILLECTOMY      Allergies  Allergen Reactions  . Aricept [Donepezil Hcl]     Muscle cramps  . Latex Itching  . Septra [Sulfamethoxazole-Trimethoprim]   . Tramadol Other (See Comments)    Pt can't remember what side effects she had, she doesn't take it now    Allergies as of 08/14/2020      Reactions   Aricept [donepezil Hcl]    Muscle cramps   Latex Itching   Septra [sulfamethoxazole-trimethoprim]    Tramadol Other (See Comments)   Pt can't remember what side effects she had, she doesn't take it now      Medication List       Accurate as of August 14, 2020  9:39 AM. If you have any questions, ask your nurse or doctor.        aspirin EC  81 MG tablet Take 81 mg by mouth daily.   CALCIUM-VITAMIN D PO Take 1 tablet by mouth daily. 600mg  calcium, 800 units Vit D   melatonin 3 MG Tabs tablet Take 3 mg by mouth at bedtime.   memantine 10 MG tablet Commonly known as: NAMENDA TAKE 1 TABLET BY MOUTH TWICE DAILY.   mirtazapine 30 MG tablet Commonly known as: REMERON TAKE ONE TABLET AT BEDTIME.   CENTRUM SILVER PO Take 2 tablets by mouth daily.   PRESERVISION AREDS PO Take 2 capsules by mouth daily.   PROBIOTIC PO Take by mouth.   rivastigmine 1.5 MG capsule Commonly known as: EXELON Take 1 capsule (1.5 mg total) by mouth 2 (two) times daily.   sertraline 50 MG tablet Commonly known as: ZOLOFT Take 1 tablet (50 mg  total) by mouth daily.   UNABLE TO FIND Med Name: Hervey Ard Thought 1 capsule twice daily   vitamin B-12 500 MCG tablet Commonly known as: CYANOCOBALAMIN Take 500 mcg by mouth daily.       Review of Systems  Review of Systems  Constitutional: Negative for activity change, appetite change, chills, diaphoresis, fatigue and fever.  HENT: Negative for mouth sores, postnasal drip, rhinorrhea, sinus pain and sore throat.   Respiratory: Negative for apnea, cough, chest tightness, shortness of breath and wheezing.   Cardiovascular: Negative for chest pain, palpitations and leg swelling.  Gastrointestinal: Negative for abdominal distention, abdominal pain, constipation, diarrhea, nausea and vomiting.  Genitourinary: Negative for dysuria and frequency.  Musculoskeletal: Negative for arthralgias, joint swelling and myalgias.  Skin: Negative for rash.  Neurological: Negative for dizziness, syncope, weakness, light-headedness and numbness.  Psychiatric/Behavioral: Negative for behavioral problems, confusion and sleep disturbance.     Immunization History  Administered Date(s) Administered  . Influenza, High Dose Seasonal PF 09/23/2017, 09/28/2019  . Influenza,inj,Quad PF,6+ Mos 09/16/2018  . Influenza-Unspecified 09/14/2017  . Moderna SARS-COVID-2 Vaccination 12/19/2019, 01/16/2020   Pertinent  Health Maintenance Due  Topic Date Due  . DEXA SCAN  Never done  . INFLUENZA VACCINE  07/15/2020  . PNA vac Low Risk Adult  Completed   Fall Risk  03/07/2020 02/08/2019 12/29/2018 09/21/2018 05/01/2017  Falls in the past year? 0 0 0 No Yes  Number falls in past yr: 0 0 0 - 1  Injury with Fall? - 0 0 - No  Risk for fall due to : - - - - Impaired balance/gait   Functional Status Survey:    Vitals:   08/14/20 0933  BP: 110/60  Pulse: 74  Resp: 20  Temp: 98.3 F (36.8 C)  SpO2: 93%  Weight: 123 lb (55.8 kg)  Height: 5' 2.5" (1.588 m)   Body mass index is 22.14 kg/m. Physical Exam    Constitutional:  Well-developed and well-nourished.  HENT:  Head: Normocephalic.  Mouth/Throat: Oropharynx is clear and moist.  Eyes: Pupils are equal, round, and reactive to light.  Neck: Neck supple.  Cardiovascular: Normal rate and normal heart sounds.  No murmur heard. Pulmonary/Chest: Effort normal and breath sounds normal. No respiratory distress. No wheezes. She has no rales.  Abdominal: Soft. Bowel sounds are normal. No distension. There is no tenderness. There is no rebound.  Musculoskeletal: No edema.  Lymphadenopathy: none Neurological:No Focal Deficits Skin: Skin is warm and dry.  Psychiatric: Normal mood and affect. Behavior is normal. Thought content normal.    Labs reviewed: Recent Labs    03/02/20 0829  NA 140  K 4.5  CL 103  CO2 26  GLUCOSE 94  BUN 22  CREATININE 1.15*  CALCIUM 9.4   Recent Labs    03/02/20 0829  AST 20  ALT 13  BILITOT 0.5  PROT 7.0   Recent Labs    03/02/20 0829  WBC 6.1  NEUTROABS 3,733  HGB 13.1  HCT 38.7  MCV 94.4  PLT 260   Lab Results  Component Value Date   TSH 3.47 12/22/2018   No results found for: HGBA1C Lab Results  Component Value Date   CHOL 194 08/01/2019   HDL 41 (L) 08/01/2019   LDLCALC 140 (H) 08/01/2019   TRIG 44 08/01/2019   CHOLHDL 4.7 08/01/2019    Significant Diagnostic Results in last 30 days:  No results found.  Assessment/Plan Late onset Alzheimer's disease without behavioral disturbance ([place]) Doing well On Exelon Patch and Namenda Vitamin B12 deficiency Continue Supplement Mixed hyperlipidemia Refused Statins Depression, recurrent ([place]) Doing well on Remeron and Zoloft Stage 3a chronic kidney disease Creat Stable   Family/ staff Communication:   Labs/tests ordered:

## 2020-08-29 ENCOUNTER — Other Ambulatory Visit: Payer: Medicare Other

## 2020-08-30 ENCOUNTER — Other Ambulatory Visit: Payer: Self-pay

## 2020-08-30 DIAGNOSIS — N1831 Chronic kidney disease, stage 3a: Secondary | ICD-10-CM

## 2020-08-30 DIAGNOSIS — E785 Hyperlipidemia, unspecified: Secondary | ICD-10-CM

## 2020-09-05 ENCOUNTER — Encounter: Payer: Self-pay | Admitting: Internal Medicine

## 2020-09-10 LAB — VITAMIN D 25 HYDROXY (VIT D DEFICIENCY, FRACTURES): Vit D, 25-Hydroxy: 38

## 2020-09-10 LAB — BASIC METABOLIC PANEL
BUN: 18 (ref 4–21)
CO2: 28 — AB (ref 13–22)
Chloride: 98 — AB (ref 99–108)
Creatinine: 1 (ref 0.5–1.1)
Glucose: 106
Potassium: 4.3 (ref 3.4–5.3)
Sodium: 132 — AB (ref 137–147)

## 2020-09-10 LAB — CBC AND DIFFERENTIAL
HCT: 37 (ref 36–46)
Hemoglobin: 12.8 (ref 12.0–16.0)
Neutrophils Absolute: 4504
Platelets: 245 (ref 150–399)
WBC: 7.3

## 2020-09-10 LAB — TSH: TSH: 4.01 (ref 0.41–5.90)

## 2020-09-10 LAB — LIPID PANEL
Cholesterol: 217 — AB (ref 0–200)
HDL: 50 (ref 35–70)
LDL Cholesterol: 148
LDl/HDL Ratio: 4.3
Triglycerides: 84 (ref 40–160)

## 2020-09-10 LAB — HEPATIC FUNCTION PANEL
ALT: 16 (ref 7–35)
AST: 22 (ref 13–35)
Alkaline Phosphatase: 53 (ref 25–125)
Bilirubin, Total: 0.5

## 2020-09-10 LAB — COMPREHENSIVE METABOLIC PANEL
Albumin: 4.4 (ref 3.5–5.0)
Calcium: 9.1 (ref 8.7–10.7)
Globulin: 2.5

## 2020-09-10 LAB — CBC: RBC: 3.98 (ref 3.87–5.11)

## 2020-09-11 ENCOUNTER — Encounter: Payer: Self-pay | Admitting: Internal Medicine

## 2020-09-26 LAB — COMPREHENSIVE METABOLIC PANEL: Calcium: 8.9 (ref 8.7–10.7)

## 2020-09-26 LAB — BASIC METABOLIC PANEL
BUN: 17 (ref 4–21)
CO2: 27 — AB (ref 13–22)
Chloride: 100 (ref 99–108)
Creatinine: 1.1 (ref 0.5–1.1)
Glucose: 89
Potassium: 4.6 (ref 3.4–5.3)
Sodium: 136 — AB (ref 137–147)

## 2020-10-15 ENCOUNTER — Non-Acute Institutional Stay: Payer: Medicare Other | Admitting: Nurse Practitioner

## 2020-10-15 ENCOUNTER — Encounter: Payer: Self-pay | Admitting: Nurse Practitioner

## 2020-10-15 DIAGNOSIS — E782 Mixed hyperlipidemia: Secondary | ICD-10-CM

## 2020-10-15 DIAGNOSIS — R413 Other amnesia: Secondary | ICD-10-CM

## 2020-10-15 DIAGNOSIS — N1832 Chronic kidney disease, stage 3b: Secondary | ICD-10-CM | POA: Diagnosis not present

## 2020-10-15 DIAGNOSIS — E538 Deficiency of other specified B group vitamins: Secondary | ICD-10-CM

## 2020-10-15 DIAGNOSIS — F339 Major depressive disorder, recurrent, unspecified: Secondary | ICD-10-CM | POA: Diagnosis not present

## 2020-10-15 NOTE — Assessment & Plan Note (Signed)
Vit B12 deficiency, takes Vit B12, Vit B12 1010 03/02/20, Hgb 12.8 09/10/20

## 2020-10-15 NOTE — Assessment & Plan Note (Signed)
Depression, takes Mirtazapine, Sertraline

## 2020-10-15 NOTE — Assessment & Plan Note (Signed)
CKD creat 1.1 09/26/20  

## 2020-10-15 NOTE — Progress Notes (Addendum)
Location:   [place] Room Number: [place] of Service:  ALF [phone]) Provider:  Marda Stalker, Lennie Odor NP  [name], MD  Patient Care Team: [name], MD as PCP - General (Internal Medicine) [name], MD as Referring Physician (Physical Medicine and Rehabilitation) Lajean Manes, MD as Consulting Physician (Internal Medicine)  Extended Emergency Contact Information Primary Emergency Contact: [name], [place] Phone: [phone] Relation: Daughter Secondary Emergency Contact: Myers,Lois  [place] Phone: [phone] Relation: Daughter  Code Status:  DNR Goals of care: Advanced Directive information Advanced Directives 03/21/2021  Does Patient Have a Medical Advance Directive? Yes  Type of Paramedic of [place];Living will;Out of facility DNR (pink MOST or yellow form)  Does patient want to make changes to medical advance directive? No - Patient declined  Copy of [place] in Chart? Yes - validated most recent copy scanned in chart (See row information)  Pre-existing out of facility DNR order (yellow form or pink MOST form) -     Chief Complaint  Patient presents with  . Medical Management of Chronic Issues  . Health Maintenance    TDAP & Dexa scan    HPI:  Pt is a 86 y.o. female seen today for medical management of chronic diseases.      Hx of Hyperlipidemia, not taking statin, LDL 140 08/01/19, 148 09/10/20, takes ASA 81mg  qd, refused statin.              CKD creat 1.1 09/26/20             Depression, takes Mirtazapine, Sertraline             Dementia, takes Exelon, Memantine, resides [facility] for safety, care assistance.              Vit B12 deficiency, takes Vit B12, Vit B12 1010 03/02/20, Hgb 12.8 09/10/20  Past Medical History:  Diagnosis Date  . BPV (benign positional vertigo)   . Cancer [facility])    uterine cancer  . Carpal  tunnel syndrome on left 06/02/2017  . Cognitive changes   . Depression   . Droopy eyelid, right   . 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 Dyslipidemia   . Fibromyalgia   . Granuloma annulare   . History of uterine cancer 2003   treated with hysterectomy  . Lumbar radicular syndrome    left L5  . Macular degeneration    left eye  . Sciatica   . TIA (transient ischemic attack)    Past Surgical History:  Procedure Laterality Date  . ABDOMINAL HYSTERECTOMY    . BREAST BIOPSY  2007  . CATARACT EXTRACTION    . TONSILLECTOMY      Allergies  Allergen Reactions  . Aricept [Donepezil Hcl]     Muscle cramps  . Latex Itching  . Tramadol Other (See Comments)    Pt can't remember what side effects she had, she doesn't take it now    Allergies as of 10/15/2020      Reactions   Aricept [donepezil Hcl]    Muscle cramps   Latex Itching   Septra [sulfamethoxazole-trimethoprim]    Tramadol Other (See Comments)   Pt can't remember what side effects she had, she doesn't take it now      Medication List       Accurate as of October 15, 2020  11:59 PM. If you have any questions, ask your nurse or doctor.        STOP taking these medications   PROBIOTIC PO Stopped by: Rita Vialpando X Diondra Pines, NP     TAKE these medications   aspirin EC 81 MG tablet Take 81 mg by mouth daily.   CALCIUM-VITAMIN D PO Take 1 tablet by mouth daily. 600mg  calcium, 800 units Vit D   LACTOBACILLUS PO Take 1 capsule by mouth daily. 15 billion cell   melatonin 3 MG Tabs tablet Take 3 mg by mouth at bedtime.   memantine 10 MG tablet Commonly known as: NAMENDA TAKE 1 TABLET BY MOUTH TWICE DAILY.   mirtazapine 30 MG tablet Commonly known as: REMERON TAKE ONE TABLET AT BEDTIME.   CENTRUM SILVER PO Take 2 tablets by mouth daily.   PRESERVISION AREDS PO Take 2 capsules by mouth daily.   rivastigmine 1.5 MG capsule Commonly known as: EXELON Take 1 capsule (1.5 mg total) by mouth 2 (two) times daily.    sertraline 50 MG tablet Commonly known as: ZOLOFT Take 1 tablet (50 mg total) by mouth daily.   UNABLE TO FIND Med Name: Hervey Ard Thought 1 capsule twice daily   vitamin B-12 500 MCG tablet Commonly known as: CYANOCOBALAMIN Take 500 mcg by mouth daily.       Review of Systems  Constitutional: Negative for activity change, appetite change and fever.  HENT: Positive for hearing loss. Negative for congestion, trouble swallowing and voice change.   Eyes: Negative for visual disturbance.  Respiratory: Negative for cough, chest tightness, shortness of breath and wheezing.   Cardiovascular: Negative for chest pain, palpitations and leg swelling.  Gastrointestinal: Negative for abdominal distention, abdominal pain, constipation, diarrhea, nausea and vomiting.  Genitourinary: Negative for dysuria, frequency and urgency.  Musculoskeletal: Positive for gait problem.  Skin: Negative for color change and pallor.  Neurological: Negative for dizziness, speech difficulty and headaches.       Dementia  Psychiatric/Behavioral: Negative for agitation, behavioral problems, hallucinations and sleep disturbance. The patient is not nervous/anxious.     Immunization History  Administered Date(s) Administered  . Influenza, High Dose Seasonal PF 09/23/2017, 09/28/2019  . Influenza,inj,Quad PF,6+ Mos 09/16/2018  . Influenza-Unspecified 09/14/2017, 09/26/2020  . Moderna Sars-Covid-2 Vaccination 12/19/2019, 01/16/2020   Pertinent  Health Maintenance Due  Topic Date Due  . DEXA SCAN  Never done  . INFLUENZA VACCINE  07/15/2021  . PNA vac Low Risk Adult  Completed   Fall Risk  03/07/2020 02/08/2019 12/29/2018 09/21/2018 05/01/2017  Falls in the past year? 0 0 0 No Yes  Number falls in past yr: 0 0 0 - 1  Injury with Fall? - 0 0 - No  Risk for fall due to : - - - - Impaired balance/gait   Functional Status Survey:    Vitals:   10/15/20 0900  BP: 120/80  Pulse: 100  Resp: 20  Temp: 98.6 F (37 C)   SpO2: 93%  Weight: 126 lb 6.4 oz (57.3 kg)  Height: 5' 2.5" (1.588 m)   Body mass index is 22.75 kg/m. Physical Exam Vitals and nursing note reviewed.  Constitutional:      Appearance: Normal appearance.  HENT:     Head: Normocephalic and atraumatic.     Nose: Nose normal.     Mouth/Throat:     Mouth: Mucous membranes are moist.  Eyes:     Extraocular Movements: Extraocular movements intact.     Conjunctiva/sclera: Conjunctivae normal.  Pupils: Pupils are equal, round, and reactive to light.  Cardiovascular:     Rate and Rhythm: Normal rate and regular rhythm.     Heart sounds: No murmur heard.   Pulmonary:     Effort: Pulmonary effort is normal.     Breath sounds: No rales.  Abdominal:     General: Bowel sounds are normal.     Palpations: Abdomen is soft.     Tenderness: There is no abdominal tenderness.  Musculoskeletal:     Cervical back: Normal range of motion and neck supple.     Right lower leg: No edema.     Left lower leg: No edema.  Skin:    General: Skin is warm and dry.  Neurological:     General: No focal deficit present.     Mental Status: She is alert. Mental status is at baseline.     Motor: No weakness.     Coordination: Coordination normal.     Gait: Gait abnormal.     Comments: Oriented to person, place.   Psychiatric:        Mood and Affect: Mood normal.        Behavior: Behavior normal.     Labs reviewed: Recent Labs    09/10/20 0000 09/26/20 0000  NA 132* 136*  K 4.3 4.6  CL 98* 100  CO2 28* 27*  BUN 18 17  CREATININE 1.0 1.1  CALCIUM 9.1 8.9   Recent Labs    09/10/20 0000  AST 22  ALT 16  ALKPHOS 53  ALBUMIN 4.4   Recent Labs    09/10/20 0000  WBC 7.3  NEUTROABS 4,504.00  HGB 12.8  HCT 37  PLT 245   Lab Results  Component Value Date   TSH 4.01 09/10/2020   No results found for: HGBA1C Lab Results  Component Value Date   CHOL 217 (A) 09/10/2020   HDL 50 09/10/2020   LDLCALC 148 09/10/2020   TRIG 84  09/10/2020   CHOLHDL 4.7 08/01/2019    Significant Diagnostic Results in last 30 days:  No results found.  Assessment/Plan Hyperlipidemia Hx of Hyperlipidemia, not taking statin, LDL 140 08/01/19, 148 09/10/20, takes ASA 81mg  qd, refused statin.   CKD (chronic kidney disease) stage 3, GFR 30-59 ml/min (HCC) CKD creat 1.1 09/26/20   Depression, recurrent (HCC) Depression, takes Mirtazapine, Sertraline   Memory difficulty Dementia, takes Exelon, Memantine, resides [facility] for safety, care assistance.   Vitamin B12 deficiency Vit B12 deficiency, takes Vit B12, Vit B12 1010 03/02/20, Hgb 12.8 09/10/20       Family/ staff Communication: plan of care reviewed with the patient and charge nurse.   Labs/tests ordered:  none  Time spend 40 minutes.

## 2020-10-15 NOTE — Assessment & Plan Note (Signed)
Hx of Hyperlipidemia, not taking statin, LDL 140 08/01/19, 148 09/10/20, takes ASA 81mg  qd, refused statin.

## 2020-10-15 NOTE — Assessment & Plan Note (Signed)
Dementia, takes Exelon, Memantine, resides [facility] for safety, care assistance.

## 2020-10-16 ENCOUNTER — Encounter: Payer: Self-pay | Admitting: Nurse Practitioner

## 2020-10-25 ENCOUNTER — Other Ambulatory Visit: Payer: Self-pay

## 2020-10-25 ENCOUNTER — Ambulatory Visit (INDEPENDENT_AMBULATORY_CARE_PROVIDER_SITE_OTHER): Payer: Medicare Other | Admitting: Neurology

## 2020-10-25 ENCOUNTER — Encounter: Payer: Self-pay | Admitting: Neurology

## 2020-10-25 VITALS — BP 158/75 | HR 86 | Ht 63.0 in | Wt 137.5 lb

## 2020-10-25 DIAGNOSIS — R413 Other amnesia: Secondary | ICD-10-CM | POA: Diagnosis not present

## 2020-10-25 DIAGNOSIS — R269 Unspecified abnormalities of gait and mobility: Secondary | ICD-10-CM | POA: Diagnosis not present

## 2020-10-25 NOTE — Progress Notes (Signed)
Reason for visit: Memory disturbance  Janet [name] is an 86 y.o. female  History of present illness:  Janet [name] is an 86 year old left-handed white female with a history of a memory disorder that has been gradually progressive.  The patient is on Namenda and low-dose Exelon capsules.  She has not been able to tolerate higher doses of Exelon greater than 1.5 milligrams twice daily.  She has had some issues with mild hyponatremia, she had a level in September of 132 but a recent check showed a level of 136.  The patient has some slight balance issues, she did fall on 06 September 2020, she did hit her head but she has not had any long-term effects from this.  She uses a cane for ambulation.  The patient now is having assisted living at [facility]  She returns for an evaluation.  Past Medical History:  Diagnosis Date  . BPV (benign positional vertigo)   . Cancer [facility])    uterine cancer  . Carpal tunnel syndrome on left 06/02/2017  . Cognitive changes   . Depression   . Droopy eyelid, right   . 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 Dyslipidemia   . Fibromyalgia   . Granuloma annulare   . History of uterine cancer 2003   treated with hysterectomy  . Lumbar radicular syndrome    left L5  . Macular degeneration    left eye  . Sciatica   . TIA (transient ischemic attack)     Past Surgical History:  Procedure Laterality Date  . ABDOMINAL HYSTERECTOMY    . BREAST BIOPSY  2007  . CATARACT EXTRACTION    . TONSILLECTOMY      Family History  Problem Relation Age of Onset  . Depression Mother   . Diabetes Father   . Heart attack Father   . Dementia Sister   . Pneumonia Brother   . Breast cancer Daughter     Social history:  reports that she has never smoked. She has never used smokeless tobacco. She reports that she does not drink alcohol and does not use drugs.    Allergies  Allergen Reactions  . Aricept [Donepezil Hcl]     Muscle cramps  . Latex Itching  . Tramadol  Other (See Comments)    Pt can't remember what side effects she had, she doesn't take it now    Medications:  Prior to Admission medications   Medication Sig Start Date End Date Taking? Authorizing Provider  aspirin EC 81 MG tablet Take 81 mg by mouth daily.    [provider]  CALCIUM-VITAMIN D PO Take 1 tablet by mouth daily. 600mg  calcium, 800 units Vit D    [provider]  LACTOBACILLUS PO Take 1 capsule by mouth daily. 15 billion cell    [provider]  melatonin 3 MG TABS tablet Take 3 mg by mouth at bedtime.    [provider]  memantine (NAMENDA) 10 MG tablet TAKE 1 TABLET BY MOUTH TWICE DAILY. 04/23/20   Kathrynn Ducking, MD  mirtazapine (REMERON) 30 MG tablet TAKE ONE TABLET AT BEDTIME. 06/01/20   [name], MD  Multiple Vitamins-Minerals (CENTRUM SILVER PO) Take 2 tablets by mouth daily.     [provider]  Multiple Vitamins-Minerals (PRESERVISION AREDS PO) Take 2 capsules by mouth daily.     [provider]  rivastigmine (EXELON) 1.5 MG capsule Take 1 capsule (1.5 mg total) by mouth 2 (two) times daily.  04/27/20   Kathrynn Ducking, MD  sertraline (ZOLOFT) 50 MG tablet Take 1 tablet (50 mg total) by mouth daily. 01/05/20   [name], MD  UNABLE TO FIND Med Name: Hervey Ard Thought 1 capsule twice daily    [provider]  vitamin B-12 (CYANOCOBALAMIN) 500 MCG tablet Take 500 mcg by mouth daily.    [provider]    ROS:  Out of a complete 14 system review of symptoms, the patient complains only of the following symptoms, and all other reviewed systems are negative.  Memory problems Walking difficulty  Blood pressure (!) 158/75, pulse 86, height 5\' 3"  (1.6 m), weight 137 lb 8 oz (62.4 kg).  Physical Exam  General: The patient is alert and cooperative at the time of the examination.  Skin: No significant peripheral edema is noted.   Neurologic Exam  Mental status: The patient is alert and  oriented x 3 at the time of the examination. The Mini-Mental status examination done today shows a total score 17/30.   Cranial nerves: Facial symmetry is present. Speech is normal, no aphasia or dysarthria is noted. Extraocular movements are full. Visual fields are full.  Motor: The patient has good strength in all 4 extremities.  Sensory examination: Soft touch sensation is symmetric on the face, arms, and legs.  Coordination: The patient has good finger-nose-finger and heel-to-shin bilaterally.  Gait and station: The patient is able to walk with a cane, there is slight gait instability.  The patient is unable perform tandem gait.  Romberg is negative.  Reflexes: Deep tendon reflexes are symmetric.   Assessment/Plan:  1.  Memory disorder  2.  Mild gait disorder  The patient be set up for physical therapy at [facility].  She will continue the Namenda and Exelon, she will follow up here in 6 months.  The patient has had some slow progression of memory problems.  Jill Alexanders MD 10/25/2020 2:41 PM  [facility] [address] [place], [place] [address]  Phone [phone] Fax [phone]

## 2021-01-08 ENCOUNTER — Encounter: Payer: Self-pay | Admitting: Internal Medicine

## 2021-01-08 ENCOUNTER — Non-Acute Institutional Stay: Payer: Medicare Other | Admitting: Internal Medicine

## 2021-01-08 DIAGNOSIS — E782 Mixed hyperlipidemia: Secondary | ICD-10-CM | POA: Diagnosis not present

## 2021-01-08 DIAGNOSIS — G301 Alzheimer's disease with late onset: Secondary | ICD-10-CM

## 2021-01-08 DIAGNOSIS — N1831 Chronic kidney disease, stage 3a: Secondary | ICD-10-CM

## 2021-01-08 DIAGNOSIS — F339 Major depressive disorder, recurrent, unspecified: Secondary | ICD-10-CM

## 2021-01-08 DIAGNOSIS — E871 Hypo-osmolality and hyponatremia: Secondary | ICD-10-CM

## 2021-01-08 DIAGNOSIS — F0280 Dementia in other diseases classified elsewhere without behavioral disturbance: Secondary | ICD-10-CM

## 2021-01-08 NOTE — Progress Notes (Signed)
Location:   [place] Room Number: [place] of Service:  ALF [phone]) Provider:  Veleta Miners MD  [name], MD  Patient Care Team: [name], MD as PCP - General (Internal Medicine) [name], MD as Referring Physician (Physical Medicine and Rehabilitation) Lajean Manes, MD as Consulting Physician (Internal Medicine)  Extended Emergency Contact Information Primary Emergency Contact: [name], [place] Phone: [phone] Relation: Daughter Secondary Emergency Contact: Myers,Lois  [place] Phone: [phone] Relation: Daughter  Code Status:  DNR Goals of care: Advanced Directive information Advanced Directives 10/15/2020  Does Patient Have a Medical Advance Directive? Yes  Type of Advance Directive Out of facility DNR (pink MOST or yellow form);Living will;Healthcare Power of Attorney  Does patient want to make changes to medical advance directive? No - Patient declined  Copy of [place] in Chart? -  Pre-existing out of facility DNR order (yellow form or pink MOST form) Pink MOST form placed in chart (order not valid for inpatient use)     Chief Complaint  Patient presents with  . Medical Management of Chronic Issues    TDAP, Covid booster, and dexa scan    HPI:  Pt is a 87 y.o. female seen today for medical management of chronic diseases.    Has h/o hyperlipidemia, depression, dementia and unsteady gait  Doing well in [place] Has gained weight Walks with her Cane No Falls. Mood is stable No Nursing issues   Past Medical History:  Diagnosis Date  . BPV (benign positional vertigo)   . Cancer [facility])    uterine cancer  . Carpal tunnel syndrome on left 06/02/2017  . Cognitive changes   . Depression   . Droopy eyelid, right   . 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 Dyslipidemia   . Fibromyalgia   . Granuloma annulare   . History of  uterine cancer 2003   treated with hysterectomy  . Lumbar radicular syndrome    left L5  . Macular degeneration    left eye  . Sciatica   . TIA (transient ischemic attack)    Past Surgical History:  Procedure Laterality Date  . ABDOMINAL HYSTERECTOMY    . BREAST BIOPSY  2007  . CATARACT EXTRACTION    . TONSILLECTOMY      Allergies  Allergen Reactions  . Aricept [Donepezil Hcl]     Muscle cramps  . Latex Itching  . Tramadol Other (See Comments)    Pt can't remember what side effects she had, she doesn't take it now    Allergies as of 01/08/2021      Reactions   Aricept [donepezil Hcl]    Muscle cramps   Latex Itching   Tramadol Other (See Comments)   Pt can't remember what side effects she had, she doesn't take it now      Medication List       Accurate as of January 08, 2021  1:25 PM. If you have any questions, ask your nurse or doctor.        aspirin EC 81 MG tablet Take 81 mg by mouth daily.   CALCIUM-VITAMIN D PO Take 1 tablet by mouth daily. 600mg  calcium, 800 units Vit D   LACTOBACILLUS PO Take 1 capsule by mouth daily. 15 billion cell   melatonin 3 MG Tabs tablet Take 3 mg by mouth at bedtime.   memantine 10  MG tablet Commonly known as: NAMENDA TAKE 1 TABLET BY MOUTH TWICE DAILY.   mirtazapine 30 MG tablet Commonly known as: REMERON TAKE ONE TABLET AT BEDTIME.   CENTRUM SILVER PO Take 2 tablets by mouth daily.   PRESERVISION AREDS PO Take 2 capsules by mouth daily.   rivastigmine 1.5 MG capsule Commonly known as: EXELON Take 1 capsule (1.5 mg total) by mouth 2 (two) times daily.   sertraline 50 MG tablet Commonly known as: ZOLOFT Take 1 tablet (50 mg total) by mouth daily.   UNABLE TO FIND Med Name: Hervey Ard Thought 1 capsule twice daily   vitamin B-12 500 MCG tablet Commonly known as: CYANOCOBALAMIN Take 500 mcg by mouth daily.       Review of Systems  Review of Systems  Constitutional: Negative for activity change, appetite  change, chills, diaphoresis, fatigue and fever.  HENT: Negative for mouth sores, postnasal drip, rhinorrhea, sinus pain and sore throat.   Respiratory: Negative for apnea, cough, chest tightness, shortness of breath and wheezing.   Cardiovascular: Negative for chest pain, palpitations and leg swelling.  Gastrointestinal: Negative for abdominal distention, abdominal pain, constipation, diarrhea, nausea and vomiting.  Genitourinary: Negative for dysuria and frequency.  Musculoskeletal: Negative for arthralgias, joint swelling and myalgias.  Skin: Negative for rash.  Neurological: Negative for dizziness, syncope, weakness, light-headedness and numbness.  Psychiatric/Behavioral: Negative for behavioral problems, confusion and sleep disturbance.     Immunization History  Administered Date(s) Administered  . Influenza, High Dose Seasonal PF 09/23/2017, 09/28/2019  . Influenza,inj,Quad PF,6+ Mos 09/16/2018  . Influenza-Unspecified 09/14/2017, 09/26/2020  . Moderna Sars-Covid-2 Vaccination 12/19/2019, 01/16/2020   Pertinent  Health Maintenance Due  Topic Date Due  . DEXA SCAN  Never done  . INFLUENZA VACCINE  Completed  . PNA vac Low Risk Adult  Completed   Fall Risk  03/07/2020 02/08/2019 12/29/2018 09/21/2018 05/01/2017  Falls in the past year? 0 0 0 No Yes  Number falls in past yr: 0 0 0 - 1  Injury with Fall? - 0 0 - No  Risk for fall due to : - - - - Impaired balance/gait   Functional Status Survey:    Vitals:   01/08/21 1312  BP: 124/64  Pulse: 74  Resp: 16  Temp: 98.2 F (36.8 C)  SpO2: 95%  Weight: 133 lb (60.3 kg)  Height: 5\' 3"  (1.6 m)   Body mass index is 23.56 kg/m. Physical Exam  Constitutional: . Well-developed and well-nourished.  HENT:  Head: Normocephalic.  Mouth/Throat: Oropharynx is clear and moist.  Eyes: Pupils are equal, round, and reactive to light.  Neck: Neck supple.  Cardiovascular: Normal rate and normal heart sounds.  No murmur  heard. Pulmonary/Chest: Effort normal and breath sounds normal. No respiratory distress. No wheezes. She has no rales.  Abdominal: Soft. Bowel sounds are normal. No distension. There is no tenderness. There is no rebound.  Musculoskeletal: No edema.  Lymphadenopathy: none Neurological: No Focal Deficits Skin: Skin is warm and dry.  Psychiatric: Normal mood and affect. Behavior is normal. Thought content normal.    Labs reviewed: Recent Labs    03/02/20 0829 09/10/20 0000 09/26/20 0000  NA 140 132* 136*  K 4.5 4.3 4.6  CL 103 98* 100  CO2 26 28* 27*  GLUCOSE 94  --   --   BUN 22 18 17   CREATININE 1.15* 1.0 1.1  CALCIUM 9.4 9.1 8.9   Recent Labs    03/02/20 0829 09/10/20 0000  AST 20 22  ALT 13 16  ALKPHOS  --  53  BILITOT 0.5  --   PROT 7.0  --   ALBUMIN  --  4.4   Recent Labs    03/02/20 0829 09/10/20 0000  WBC 6.1 7.3  NEUTROABS 3,733 4,504.00  HGB 13.1 12.8  HCT 38.7 37  MCV 94.4  --   PLT 260 245   Lab Results  Component Value Date   TSH 4.01 09/10/2020   No results found for: HGBA1C Lab Results  Component Value Date   CHOL 217 (A) 09/10/2020   HDL 50 09/10/2020   LDLCALC 148 09/10/2020   TRIG 84 09/10/2020   CHOLHDL 4.7 08/01/2019    Significant Diagnostic Results in last 30 days:  No results found.  Assessment/Plan Late onset Alzheimer's disease without behavioral disturbance ([place]) Follows with Neurology On Exelon and Namenda  Mixed hyperlipidemia Refused statins  Stage 3a chronic kidney disease ([place]) Creat stable  Depression, recurrent ([place]) On Zoloft and Remeron Doing well  Hyponatremia Sodium low But stable    Family/ staff Communication:   Labs/tests ordered:

## 2021-03-06 LAB — BASIC METABOLIC PANEL

## 2021-03-21 ENCOUNTER — Non-Acute Institutional Stay: Payer: Medicare Other | Admitting: Nurse Practitioner

## 2021-03-21 ENCOUNTER — Encounter: Payer: Self-pay | Admitting: Nurse Practitioner

## 2021-03-21 DIAGNOSIS — F339 Major depressive disorder, recurrent, unspecified: Secondary | ICD-10-CM

## 2021-03-21 DIAGNOSIS — N1832 Chronic kidney disease, stage 3b: Secondary | ICD-10-CM

## 2021-03-21 DIAGNOSIS — E538 Deficiency of other specified B group vitamins: Secondary | ICD-10-CM | POA: Diagnosis not present

## 2021-03-21 DIAGNOSIS — R413 Other amnesia: Secondary | ICD-10-CM | POA: Diagnosis not present

## 2021-03-21 DIAGNOSIS — E782 Mixed hyperlipidemia: Secondary | ICD-10-CM

## 2021-03-21 NOTE — Assessment & Plan Note (Signed)
Vit B12 deficiency, takes Vit B12, Vit B12 1010 03/02/20, Hgb 12.8 09/10/20, update CBC/diff, CMP/eGFR, Lipid panel.

## 2021-03-21 NOTE — Assessment & Plan Note (Addendum)
takes Exelon, Memantine, resides [facility] for safety, care assistance. underwent Neurology consultation.

## 2021-03-21 NOTE — Progress Notes (Addendum)
Location:   [place] Room Number: 035-W Place of Service:  ALF (13) Provider:  Milea Klink, NP    Patient Care Team: [name], MD as PCP - General (Internal Medicine) [name], MD as Referring Physician (Physical Medicine and Rehabilitation) Lajean Manes, MD as Consulting Physician (Internal Medicine)  Extended Emergency Contact Information Primary Emergency Contact: [name],  [place] Phone: [phone] Relation: Daughter Secondary Emergency Contact: Myers,Lois  [place] Phone: [phone] Relation: Daughter  Code Status:  DNR  Goals of care: Advanced Directive information Advanced Directives 03/21/2021  Does Patient Have a Medical Advance Directive? Yes  Type of Paramedic of [place];Living will;Out of facility DNR (pink MOST or yellow form)  Does patient want to make changes to medical advance directive? No - Patient declined  Copy of [place] in Chart? Yes - validated most recent copy scanned in chart (See row information)  Pre-existing out of facility DNR order (yellow form or pink MOST form) -     Chief Complaint  Patient presents with  . Medical Management of Chronic Issues    Routine Visit.    HPI:  Pt is a 87 y.o. female seen today for medical management of chronic diseases.    Hx of Hyperlipidemia, not taking statin, LDL 140 08/01/19, 148 09/10/20, takes ASA 67m qd, refused statin.  CKD creat 1.1 09/26/20 Depression, takes Mirtazapine, Sertraline, TSH 4.01 09/10/20 Dementia, takes Exelon, Memantine, [facility] underwent Neurology consultation.  Vit B12 deficiency, takes Vit B12, Vit B12 1010 03/02/20, Hgb 12.8 09/10/20   Past Medical History:  Diagnosis Date  . BPV (benign positional vertigo)   . Cancer ([facility]    uterine cancer   . Carpal tunnel syndrome on left 06/02/2017  . Cognitive changes   . Depression   . Droopy eyelid, right   . 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 Dyslipidemia   . Fibromyalgia   . Granuloma annulare   . History of uterine cancer 2003   treated with hysterectomy  . Lumbar radicular syndrome    left L5  . Macular degeneration    left eye  . Sciatica   . TIA (transient ischemic attack)    Past Surgical History:  Procedure Laterality Date  . ABDOMINAL HYSTERECTOMY    . BREAST BIOPSY  2007  . CATARACT EXTRACTION    . TONSILLECTOMY      Allergies  Allergen Reactions  . Aricept [Donepezil Hcl]     Muscle cramps  . Latex Itching  . Tramadol Other (See Comments)    Pt can't remember what side effects she had, she doesn't take it now    Allergies as of 03/21/2021      Reactions   Aricept [donepezil Hcl]    Muscle cramps   Latex Itching   Tramadol Other (See Comments)   Pt can't remember what side effects she had, she doesn't take it now      Medication List       Accurate as of March 21, 2021 11:59 PM. If you have any questions, ask your nurse or doctor.        aspirin EC 81 MG tablet Take 81 mg by mouth daily.   CALCIUM-VITAMIN D PO Take 1 tablet by mouth daily. 606mcalcium, 800 units Vit D   LACTOBACILLUS PO Take 1 capsule by mouth  daily. 15 billion cell   melatonin 3 MG Tabs tablet Take 3 mg by mouth at bedtime.   memantine 10 MG tablet Commonly known as: NAMENDA TAKE 1 TABLET BY MOUTH TWICE DAILY.   mirtazapine 30 MG tablet Commonly known as: REMERON TAKE ONE TABLET AT BEDTIME.   CENTRUM SILVER PO Take 2 tablets by mouth daily.   PRESERVISION AREDS PO Take 2 capsules by mouth daily.   rivastigmine 1.5 MG capsule Commonly known as: EXELON Take 1 capsule (1.5 mg total) by mouth 2 (two) times daily.   sertraline 50 MG tablet Commonly known as: ZOLOFT Take 1 tablet (50 mg total) by mouth daily.   UNABLE TO FIND Med Name: Hervey Ard Thought 1 capsule  twice daily   vitamin B-12 500 MCG tablet Commonly known as: CYANOCOBALAMIN Take 500 mcg by mouth daily.       Review of Systems  Constitutional: Negative for appetite change, fatigue, fever and unexpected weight change.  HENT: Positive for hearing loss. Negative for congestion, trouble swallowing and voice change.   Eyes: Negative for visual disturbance.  Respiratory: Negative for cough and shortness of breath.   Cardiovascular: Negative for palpitations and leg swelling.  Gastrointestinal: Negative for abdominal pain and constipation.  Genitourinary: Negative for dysuria, frequency and urgency.  Musculoskeletal: Positive for gait problem.  Skin: Negative for color change and pallor.  Neurological: Negative for speech difficulty, light-headedness and headaches.       Dementia  Psychiatric/Behavioral: Negative for behavioral problems and sleep disturbance. The patient is not nervous/anxious.     Immunization History  Administered Date(s) Administered  . Influenza, High Dose Seasonal PF 09/23/2017, 09/28/2019  . Influenza,inj,Quad PF,6+ Mos 09/16/2018  . Influenza-Unspecified 09/14/2017, 09/26/2020  . Moderna Sars-Covid-2 Vaccination 12/19/2019, 01/16/2020   Pertinent  Health Maintenance Due  Topic Date Due  . DEXA SCAN  Never done  . INFLUENZA VACCINE  07/15/2021  . PNA vac Low Risk Adult  Completed   Fall Risk  03/07/2020 02/08/2019 12/29/2018 09/21/2018 05/01/2017  Falls in the past year? 0 0 0 No Yes  Number falls in past yr: 0 0 0 - 1  Injury with Fall? - 0 0 - No  Risk for fall due to : - - - - Impaired balance/gait   Functional Status Survey:    Vitals:   03/21/21 1010  BP: 138/72  Pulse: 80  Resp: 17  Temp: 98.1 F (36.7 C)  SpO2: 95%  Weight: 134 lb 3.2 oz (60.9 kg)  Height: _0  (1.6 m)   Body mass index is 23.77 kg/m. Physical Exam Vitals and nursing note reviewed.  Constitutional:      Appearance: Normal appearance.  HENT:     Head: Normocephalic  and atraumatic.     Mouth/Throat:     Mouth: Mucous membranes are moist.  Eyes:     Extraocular Movements: Extraocular movements intact.     Conjunctiva/sclera: Conjunctivae normal.     Pupils: Pupils are equal, round, and reactive to light.  Cardiovascular:     Rate and Rhythm: Normal rate and regular rhythm.     Heart sounds: No murmur heard.   Pulmonary:     Breath sounds: No rales.  Abdominal:     General: Bowel sounds are normal.     Palpations: Abdomen is soft.     Tenderness: There is no abdominal tenderness.  Musculoskeletal:     Cervical back: Normal range of motion and neck supple.     Right lower leg: No  edema.     Left lower leg: No edema.  Skin:    General: Skin is warm and dry.  Neurological:     General: No focal deficit present.     Mental Status: She is alert. Mental status is at baseline.     Motor: Weakness present.     Gait: Gait abnormal.     Comments: Oriented to person, place.   Psychiatric:        Mood and Affect: Mood normal.        Behavior: Behavior normal.     Labs reviewed: Recent Labs    09/10/20 0000 09/26/20 0000  NA 132* 136*  K 4.3 4.6  CL 98* 100  CO2 28* 27*  BUN 18 17  CREATININE 1.0 1.1  CALCIUM 9.1 8.9   Recent Labs    09/10/20 0000  AST 22  ALT 16  ALKPHOS 53  ALBUMIN 4.4   Recent Labs    09/10/20 0000  WBC 7.3  NEUTROABS 4,504.00  HGB 12.8  HCT 37  PLT 245   Lab Results  Component Value Date   TSH 4.01 09/10/2020   No results found for: HGBA1C Lab Results  Component Value Date   CHOL 217 (A) 09/10/2020   HDL 50 09/10/2020   LDLCALC 148 09/10/2020   TRIG 84 09/10/2020   CHOLHDL 4.7 08/01/2019    Significant Diagnostic Results in last 30 days:  No results found.  Assessment/Plan Depression, recurrent ([place]) Her mood is stable, takes Mirtazapine, Sertraline, TSH 4.01 09/10/20   Memory difficulty takes Exelon, Memantine, resides Al [facility] for safety, care assistance. underwent Neurology  consultation.   Vitamin B12 deficiency Vit B12 deficiency, takes Vit B12, Vit B12 1010 03/02/20, Hgb 12.8 09/10/20, update CBC/diff, CMP/eGFR, Lipid panel.    CKD (chronic kidney disease) stage 3, GFR 30-59 ml/min (HCC) CKD creat 1.1 09/26/20   Hyperlipidemia Hx of Hyperlipidemia, not taking statin, LDL 140 08/01/19, 148 09/10/20, takes ASA 26m qd, refused statin. update lipid panel.     Family/ staff Communication: plan of care reviewed with the patient and charge nurse.   Labs/tests ordered:    Time spend 40 minutes.

## 2021-03-21 NOTE — Assessment & Plan Note (Signed)
Hx of Hyperlipidemia, not taking statin, LDL 140 08/01/19, 148 09/10/20, takes ASA 81mg  qd, refused statin. update lipid panel.

## 2021-03-21 NOTE — Assessment & Plan Note (Addendum)
Her mood is stable, takes Mirtazapine, Sertraline, TSH 4.01 09/10/20

## 2021-03-21 NOTE — Assessment & Plan Note (Signed)
CKD creat 1.1 09/26/20

## 2021-03-29 LAB — CBC AND DIFFERENTIAL
HCT: 39 (ref 36–46)
Hemoglobin: 12.9 (ref 12.0–16.0)
Neutrophils Absolute: 3703
Platelets: 267 (ref 150–399)
WBC: 7

## 2021-03-29 LAB — BASIC METABOLIC PANEL
BUN: 20 (ref 4–21)
CO2: 29 — AB (ref 13–22)
Chloride: 101 (ref 99–108)
Creatinine: 1.2 — AB (ref 0.5–1.1)
Glucose: 87
Potassium: 4.3 (ref 3.4–5.3)
Sodium: 138 (ref 137–147)

## 2021-03-29 LAB — LIPID PANEL
Cholesterol: 217 — AB (ref 0–200)
HDL: 48 (ref 35–70)
LDL Cholesterol: 152
LDl/HDL Ratio: 4.5
Triglycerides: 70 (ref 40–160)

## 2021-03-29 LAB — COMPREHENSIVE METABOLIC PANEL
Albumin: 4.4 (ref 3.5–5.0)
Calcium: 9.5 (ref 8.7–10.7)
Globulin: 2.6

## 2021-03-29 LAB — CBC: RBC: 4.19 (ref 3.87–5.11)

## 2021-03-29 LAB — HEPATIC FUNCTION PANEL
ALT: 16 (ref 7–35)
AST: 20 (ref 13–35)
Alkaline Phosphatase: 69 (ref 25–125)
Bilirubin, Total: 0.5

## 2021-04-03 ENCOUNTER — Encounter: Payer: Self-pay | Admitting: Nurse Practitioner

## 2021-04-03 ENCOUNTER — Non-Acute Institutional Stay: Payer: Medicare Other | Admitting: Nurse Practitioner

## 2021-04-03 DIAGNOSIS — R600 Localized edema: Secondary | ICD-10-CM

## 2021-04-03 DIAGNOSIS — E538 Deficiency of other specified B group vitamins: Secondary | ICD-10-CM

## 2021-04-03 DIAGNOSIS — E782 Mixed hyperlipidemia: Secondary | ICD-10-CM | POA: Diagnosis not present

## 2021-04-03 DIAGNOSIS — R413 Other amnesia: Secondary | ICD-10-CM

## 2021-04-03 DIAGNOSIS — N1832 Chronic kidney disease, stage 3b: Secondary | ICD-10-CM | POA: Diagnosis not present

## 2021-04-03 DIAGNOSIS — F339 Major depressive disorder, recurrent, unspecified: Secondary | ICD-10-CM | POA: Diagnosis not present

## 2021-04-03 NOTE — Assessment & Plan Note (Signed)
takes Mirtazapine, Sertraline, TSH 4.01 09/10/20

## 2021-04-03 NOTE — Progress Notes (Signed)
Location:   [place] Room Number: 802 Place of Service:  SNF (31) Provider: [facility] Kathan Kirker NP  [name], MD  Patient Care Team: [name], MD as PCP - General (Internal Medicine) [name], MD as Referring Physician (Physical Medicine and Rehabilitation) Lajean Manes, MD as Consulting Physician (Internal Medicine)  Extended Emergency Contact Information Primary Emergency Contact: [name], [place] Phone: [phone] Relation: Daughter Secondary Emergency Contact: Myers,Lois  [place] Phone: [phone] Relation: Daughter  Code Status: DNR Goals of care: Advanced Directive information Advanced Directives 04/03/2021  Does Patient Have a Medical Advance Directive? Yes  Type of Paramedic of [place];Living will;Out of facility DNR (pink MOST or yellow form)  Does patient want to make changes to medical advance directive? No - Patient declined  Copy of [place] in Chart? Yes - validated most recent copy scanned in chart (See row information)  Pre-existing out of facility DNR order (yellow form or pink MOST form) Pink MOST form placed in chart (order not valid for inpatient use)     Chief Complaint  Patient presents with  . Acute Visit    Swelling in legs    HPI:  Pt is a 87 y.o. female seen today for an acute visit for swelling in legs. The patient denied cough, chest pain/pressure, palpitation, SOB, or sputum production    Hx of Hyperlipidemia, not taking statin, LDL 140 08/01/19, 148 09/10/20, takes ASA 49m qd, refused statin. CKD Bun/creat 20/1.16 eGFR 43 03/28/21 Depression, takes Mirtazapine, Sertraline, TSH 4.01 09/10/20 Dementia, takes Exelon, Memantine[facility]underwent Neurology consultation.  Vit B12 deficiency, takes Vit B12, Vit B12 1010  03/02/20, Hgb 12.9 03/28/21    Past Medical History:  Diagnosis Date  . BPV (benign positional vertigo)   . Cancer ([facility]    uterine cancer  . Carpal tunnel syndrome on left 06/02/2017  . Cognitive changes   . Depression   . Droopy eyelid, right   . 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 Dyslipidemia   . Fibromyalgia   . Granuloma annulare   . History of uterine cancer 2003   treated with hysterectomy  . Lumbar radicular syndrome    left L5  . Macular degeneration    left eye  . Sciatica   . TIA (transient ischemic attack)    Past Surgical History:  Procedure Laterality Date  . ABDOMINAL HYSTERECTOMY    . BREAST BIOPSY  2007  . CATARACT EXTRACTION    . TONSILLECTOMY      Allergies  Allergen Reactions  . Aricept [Donepezil Hcl]     Muscle cramps  . Latex Itching  . Tramadol Other (See Comments)    Pt can't remember what side effects she had, she doesn't take it now    Allergies as of 04/03/2021      Reactions   Aricept [donepezil Hcl]    Muscle cramps   Latex Itching   Tramadol Other (See Comments)   Pt can't remember what side effects she had, she doesn't take it now      Medication List       Accurate as of April 03, 2021 11:59 PM. If you have any questions, ask your nurse or doctor.        aspirin EC 81 MG tablet Take 81 mg by mouth daily.   CALCIUM-VITAMIN D  PO Take 1 tablet by mouth daily. 676m calcium, 800 units Vit D   LACTOBACILLUS PO Take 1 capsule by mouth daily. 15 billion cell   melatonin 3 MG Tabs tablet Take 3 mg by mouth at bedtime.   memantine 10 MG tablet Commonly known as: NAMENDA TAKE 1 TABLET BY MOUTH TWICE DAILY.   mirtazapine 30 MG tablet Commonly known as: REMERON TAKE ONE TABLET AT BEDTIME.   CENTRUM SILVER PO Take 2 tablets by mouth daily.   PRESERVISION AREDS PO Take 2 capsules by mouth daily.   rivastigmine 1.5 MG capsule Commonly known as: EXELON Take 1 capsule (1.5 mg total) by mouth 2 (two) times daily.    sertraline 50 MG tablet Commonly known as: ZOLOFT Take 1 tablet (50 mg total) by mouth daily.   UNABLE TO FIND Med Name: SHervey ArdThought 1 capsule twice daily   vitamin B-12 500 MCG tablet Commonly known as: CYANOCOBALAMIN Take 500 mcg by mouth daily.       Review of Systems  Constitutional: Negative for appetite change, fatigue and fever.  HENT: Positive for hearing loss. Negative for congestion, trouble swallowing and voice change.   Eyes: Negative for visual disturbance.  Respiratory: Negative for cough, chest tightness, shortness of breath and wheezing.   Cardiovascular: Positive for leg swelling. Negative for chest pain and palpitations.  Gastrointestinal: Negative for abdominal pain and constipation.  Genitourinary: Negative for dysuria, frequency and urgency.  Musculoskeletal: Positive for gait problem.  Skin: Negative for color change.  Neurological: Negative for speech difficulty and light-headedness.       Dementia  Psychiatric/Behavioral: Negative for behavioral problems and sleep disturbance. The patient is not nervous/anxious.     Immunization History  Administered Date(s) Administered  . Influenza, High Dose Seasonal PF 09/23/2017, 09/28/2019  . Influenza,inj,Quad PF,6+ Mos 09/16/2018  . Influenza-Unspecified 09/14/2017, 09/26/2020  . Moderna Sars-Covid-2 Vaccination 12/19/2019, 01/16/2020   Pertinent  Health Maintenance Due  Topic Date Due  . DEXA SCAN  Never done  . INFLUENZA VACCINE  07/15/2021  . PNA vac Low Risk Adult  Completed   Fall Risk  03/07/2020 02/08/2019 12/29/2018 09/21/2018 05/01/2017  Falls in the past year? 0 0 0 No Yes  Number falls in past yr: 0 0 0 - 1  Injury with Fall? - 0 0 - No  Risk for fall due to : - - - - Impaired balance/gait   Functional Status Survey:    Vitals:   04/03/21 1640  BP: 121/63  Pulse: 84  Resp: 18  Temp: 97.6 F (36.4 C)  SpO2: 95%  Weight: 135 lb 6.4 oz (61.4 kg)  Height: _0  (1.6 m)   Body mass  index is 23.99 kg/m. Physical Exam Vitals and nursing note reviewed.  Constitutional:      Appearance: Normal appearance.  HENT:     Head: Normocephalic and atraumatic.     Mouth/Throat:     Mouth: Mucous membranes are moist.  Eyes:     Extraocular Movements: Extraocular movements intact.     Conjunctiva/sclera: Conjunctivae normal.     Pupils: Pupils are equal, round, and reactive to light.  Cardiovascular:     Rate and Rhythm: Normal rate and regular rhythm.     Heart sounds: No murmur heard.     Comments: DP pulses present R+L Pulmonary:     Breath sounds: No rales.  Abdominal:     General: Bowel sounds are normal.     Palpations: Abdomen is soft.  Tenderness: There is no abdominal tenderness.  Musculoskeletal:     Cervical back: Normal range of motion and neck supple.     Right lower leg: Edema present.     Left lower leg: Edema present.     Comments: Trace edema BLE   Skin:    General: Skin is warm and dry.  Neurological:     General: No focal deficit present.     Mental Status: She is alert. Mental status is at baseline.     Motor: No weakness.     Coordination: Coordination normal.     Gait: Gait abnormal.     Comments: Oriented to person, place.   Psychiatric:        Mood and Affect: Mood normal.        Behavior: Behavior normal.     Labs reviewed: Recent Labs    09/10/20 0000 09/26/20 0000  NA 132* 136*  K 4.3 4.6  CL 98* 100  CO2 28* 27*  BUN 18 17  CREATININE 1.0 1.1  CALCIUM 9.1 8.9   Recent Labs    09/10/20 0000  AST 22  ALT 16  ALKPHOS 53  ALBUMIN 4.4   Recent Labs    09/10/20 0000  WBC 7.3  NEUTROABS 4,504.00  HGB 12.8  HCT 37  PLT 245   Lab Results  Component Value Date   TSH 4.01 09/10/2020   No results found for: HGBA1C Lab Results  Component Value Date   CHOL 217 (A) 09/10/2020   HDL 50 09/10/2020   LDLCALC 148 09/10/2020   TRIG 84 09/10/2020   CHOLHDL 4.7 08/01/2019    Significant Diagnostic Results in  last 30 days:  No results found.  Assessment/Plan: Mild peripheral edema Trace edema BLE, will apply TED knee high, medium weight, on in am, off pm, weight weekly, observe.   Hyperlipidemia not taking statin, LDL 140 08/01/19, 148 09/10/20, takes ASA 54m qd, refused statin.   CKD (chronic kidney disease) stage 3, GFR 30-59 ml/min (HCC) Bun/creat 20/1.16 eGFR 43 03/28/21  Depression, recurrent ([place]  takes Mirtazapine, Sertraline, TSH 4.01 09/10/20   Memory difficulty  takes Exelon, Memantine, [facility]underwent Neurology consultation.    Vitamin B12 deficiency Vit B12 deficiency, takes Vit B12, Vit B12 1010 03/02/20, Hgb 12.9 03/28/21     Family/ staff Communication: plan of care reviewed with the patient and charge nurse.   Labs/tests ordered: none  Time spend 40 minutes.

## 2021-04-03 NOTE — Assessment & Plan Note (Signed)
Trace edema BLE, will apply TED knee high, medium weight, on in am, off pm, weight weekly, observe.

## 2021-04-03 NOTE — Assessment & Plan Note (Signed)
Bun/creat 20/1.16 eGFR 43 03/28/21 

## 2021-04-03 NOTE — Assessment & Plan Note (Signed)
not taking statin, LDL 140 08/01/19, 148 09/10/20, takes ASA 81mg  qd, refused statin.

## 2021-04-03 NOTE — Assessment & Plan Note (Signed)
takes Exelon, Memantine, resides Al [facility] for safety, care assistance.underwent Neurology consultation.

## 2021-04-03 NOTE — Assessment & Plan Note (Signed)
Vit B12 deficiency, takes Vit B12, Vit B12 1010 03/02/20, Hgb 12.9 03/28/21

## 2021-04-05 ENCOUNTER — Encounter: Payer: Self-pay | Admitting: Nurse Practitioner

## 2021-05-09 ENCOUNTER — Ambulatory Visit (INDEPENDENT_AMBULATORY_CARE_PROVIDER_SITE_OTHER): Payer: Medicare Other | Admitting: Neurology

## 2021-05-09 ENCOUNTER — Encounter: Payer: Self-pay | Admitting: Neurology

## 2021-05-09 VITALS — BP 156/78 | HR 82 | Ht 63.0 in | Wt 136.2 lb

## 2021-05-09 DIAGNOSIS — R413 Other amnesia: Secondary | ICD-10-CM

## 2021-05-09 NOTE — Progress Notes (Signed)
Reason for visit: Memory disturbance, gait disturbance  Janet [name] is an 87 y.o. female  History of present illness:  Janet [name] is an 87 year old left-handed white female with a history of a progressive memory disturbance.  The patient is on Namenda and low-dose Exelon, she cannot tolerate doses greater than 1.5 mg twice daily of the Exelon.  The patient is at [facility], she is socially active, she is now involved in tai chi classes, she seems to enjoy her life there.  The patient has not had any falls, she uses a cane consistently when she is outside of her room.  The patient is eating well, she has developed some swelling in the legs that seems to go down overnight.  The patient previously has had some problems with low sodium levels.  She returns to this office for further evaluation.  Past Medical History:  Diagnosis Date  . BPV (benign positional vertigo)   . Cancer [facility])    uterine cancer  . Carpal tunnel syndrome on left 06/02/2017  . Cognitive changes   . Depression   . Droopy eyelid, right   . 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 Dyslipidemia   . Fibromyalgia   . Granuloma annulare   . History of uterine cancer 2003   treated with hysterectomy  . Lumbar radicular syndrome    left L5  . Macular degeneration    left eye  . Sciatica   . TIA (transient ischemic attack)     Past Surgical History:  Procedure Laterality Date  . ABDOMINAL HYSTERECTOMY    . BREAST BIOPSY  2007  . CATARACT EXTRACTION    . TONSILLECTOMY      Family History  Problem Relation Age of Onset  . Depression Mother   . Diabetes Father   . Heart attack Father   . Dementia Sister   . Pneumonia Brother   . Breast cancer Daughter     Social history:  reports that she has never smoked. She has never used smokeless tobacco. She reports that she does not drink alcohol and does not use drugs.    Allergies  Allergen Reactions  . Aricept [Donepezil Hcl]     Muscle cramps  . Latex Itching   . Tramadol Other (See Comments)    Pt can't remember what side effects she had, she doesn't take it now    Medications:  Prior to Admission medications   Medication Sig Start Date End Date Taking? Authorizing Provider  aspirin EC 81 MG tablet Take 81 mg by mouth daily.    [provider]  CALCIUM-VITAMIN D PO Take 1 tablet by mouth daily. 657m calcium, 800 units Vit D    [provider]  LACTOBACILLUS PO Take 1 capsule by mouth daily. 15 billion cell    [provider]  melatonin 3 MG TABS tablet Take 3 mg by mouth at bedtime.    [provider]  memantine (NAMENDA) 10 MG tablet TAKE 1 TABLET BY MOUTH TWICE DAILY. 04/23/20   WKathrynn Ducking MD  mirtazapine (REMERON) 30 MG tablet TAKE ONE TABLET AT BEDTIME. 06/01/20   GVirgie Dad MD  Multiple Vitamins-Minerals (CENTRUM SILVER PO) Take 2 tablets by mouth daily.     [provider]  Multiple Vitamins-Minerals (PRESERVISION AREDS PO) Take 2 capsules by mouth daily.     [provider]  rivastigmine (EXELON) 1.5 MG capsule Take 1 capsule (1.5 mg total) by mouth 2 (two) times daily. 04/27/20  Kathrynn Ducking, MD  sertraline (ZOLOFT) 50 MG tablet Take 1 tablet (50 mg total) by mouth daily. 01/05/20   [name], MD  UNABLE TO FIND Med Name: Hervey Ard Thought 1 capsule twice daily    [provider]  vitamin B-12 (CYANOCOBALAMIN) 500 MCG tablet Take 500 mcg by mouth daily.    [provider]    ROS:  Out of a complete 14 system review of symptoms, the patient complains only of the following symptoms, and all other reviewed systems are negative.  Leg swelling Walking difficulty Memory problems  Blood pressure (!) 156/78, pulse 82, height '5\' 3"'  (1.6 m), weight 136 lb 3.2 oz (61.8 kg).  Physical Exam  General: The patient is alert and cooperative at the time of the examination.  Skin: 1+ edema below the knees is seen bilaterally.   Neurologic Exam  Mental  status: The patient is alert and oriented x 3 at the time of the examination. The Mini-Mental status examination done today shows a total score 14/30.   Cranial nerves: Facial symmetry is present. Speech is normal, no aphasia or dysarthria is noted. Extraocular movements are full. Visual fields are full.  Motor: The patient has good strength in all 4 extremities.  Sensory examination: Soft touch sensation is symmetric on the face, arms, and legs.  Coordination: The patient has good finger-nose-finger and heel-to-shin bilaterally.  Gait and station: The patient has a slightly wide-based gait, she uses a cane for ambulation.  Tandem gait was not attempted.  Romberg is negative.  Reflexes: Deep tendon reflexes are symmetric.   Assessment/Plan:  1.  Memory disturbance  2.  Gait disturbance  The patient will continue her Namenda and Exelon.  She will try to increase fluid intake during the day.  She is wearing compression stockings.  She will follow-up here in 6 months, she may see Dr. Brett Fairy on the next visit.  Jill Alexanders MD 05/09/2021 4:02 PM  [facility] [address] [place], [place] [address]  Phone [phone] Fax [phone]

## 2021-07-02 ENCOUNTER — Encounter: Payer: Self-pay | Admitting: Internal Medicine

## 2021-07-02 ENCOUNTER — Non-Acute Institutional Stay: Payer: Medicare Other | Admitting: Internal Medicine

## 2021-07-02 DIAGNOSIS — F339 Major depressive disorder, recurrent, unspecified: Secondary | ICD-10-CM | POA: Diagnosis not present

## 2021-07-02 DIAGNOSIS — N1831 Chronic kidney disease, stage 3a: Secondary | ICD-10-CM | POA: Diagnosis not present

## 2021-07-02 DIAGNOSIS — F0280 Dementia in other diseases classified elsewhere without behavioral disturbance: Secondary | ICD-10-CM

## 2021-07-02 DIAGNOSIS — G301 Alzheimer's disease with late onset: Secondary | ICD-10-CM

## 2021-07-02 DIAGNOSIS — E782 Mixed hyperlipidemia: Secondary | ICD-10-CM | POA: Diagnosis not present

## 2021-07-02 DIAGNOSIS — E871 Hypo-osmolality and hyponatremia: Secondary | ICD-10-CM

## 2021-07-02 NOTE — Progress Notes (Addendum)
Location:    [facility] Room Number: 802 Place of Service:  ALF [phone]) Provider:    [name], MD  Patient Care Team: [name], MD as PCP - General (Internal Medicine) [name], MD as Referring Physician (Physical Medicine and Rehabilitation) Lajean Manes, MD as Consulting Physician (Internal Medicine)  Extended Emergency Contact Information Primary Emergency Contact: [name], [place] Phone: [phone] Relation: Daughter Secondary Emergency Contact: Myers,Lois  [place] Phone: [phone] Relation: Daughter  Code Status:   Goals of care: Advanced Directive information Advanced Directives 07/02/2021  Does Patient Have a Medical Advance Directive? Yes  Type of Paramedic of [place];Living will;Out of facility DNR (pink MOST or yellow form)  Does patient want to make changes to medical advance directive? No - Patient declined  Copy of [place] in Chart? Yes - validated most recent copy scanned in chart (See row information)  Pre-existing out of facility DNR order (yellow form or pink MOST form) Pink MOST form placed in chart (order not valid for inpatient use)     Chief Complaint  Patient presents with   Medical Management of Chronic Issues   Health Maintenance    Shingrix,Dexa scan, TDAP    HPI:  Pt is a 87 y.o. female seen today for medical management of chronic diseases.   Has h/o hyperlipidemia, depression, Alzheimer's dementia and unsteady gait   Lives in AL Weight is stable Walks with her cane Usually stays in her room No Recent Falls. 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   Dyslipidemia     Fibromyalgia    Granuloma annulare    History of uterine cancer 2003   treated with hysterectomy   Lumbar radicular syndrome    left L5   Macular degeneration    left eye   Sciatica    TIA (transient ischemic attack)    Past Surgical History:  Procedure Laterality Date   ABDOMINAL HYSTERECTOMY     BREAST BIOPSY  2007   CATARACT EXTRACTION     TONSILLECTOMY      Allergies  Allergen Reactions   Aricept [Donepezil Hcl]     Muscle cramps   Latex Itching   Tramadol Other (See Comments)    Pt can't remember what side effects she had, she doesn't take it now    Allergies as of 07/02/2021       Reactions   Aricept [donepezil Hcl]    Muscle cramps   Latex Itching   Tramadol Other (See Comments)   Pt can't remember what side effects she had, she doesn't take it now        Medication List        Accurate as of July 02, 2021 10:12 AM. If you have any questions, ask your nurse or doctor.          aspirin EC 81 MG tablet Take 81 mg by mouth daily.   CALCIUM-VITAMIN D PO Take 1 tablet by mouth daily. 600mg  calcium, 800 units Vit D   LACTOBACILLUS PO Take 1 capsule by mouth daily. 15 billion cell  melatonin 3 MG Tabs tablet Take 3 mg by mouth at bedtime.   memantine 10 MG tablet Commonly known as: NAMENDA TAKE 1 TABLET BY MOUTH TWICE DAILY.   mirtazapine 30 MG tablet Commonly known as: REMERON TAKE ONE TABLET AT BEDTIME.   CENTRUM SILVER PO Take 2 tablets by mouth daily.   PRESERVISION AREDS PO Take 2 capsules by mouth daily.   rivastigmine 1.5 MG capsule Commonly known as: EXELON Take 1 capsule (1.5 mg total) by mouth 2 (two) times daily.   sertraline 50 MG tablet Commonly known as: ZOLOFT Take 1 tablet (50 mg total) by mouth daily.   UNABLE TO FIND Med Name: Hervey Ard Thought 1 capsule twice daily   vitamin B-12 500 MCG tablet Commonly known as: CYANOCOBALAMIN Take 500 mcg by mouth daily.        Review of Systems Review of Systems   Constitutional: Negative for activity change, appetite change, chills, diaphoresis, fatigue and fever.  HENT: Negative for mouth sores, postnasal drip, rhinorrhea, sinus pain and sore throat.   Respiratory: Negative for apnea, cough, chest tightness, shortness of breath and wheezing.   Cardiovascular: Negative for chest pain, palpitations and leg swelling.  Gastrointestinal: Negative for abdominal distention, abdominal pain, constipation, diarrhea, nausea and vomiting.  Genitourinary: Negative for dysuria and frequency.  Musculoskeletal: Negative for arthralgias, joint swelling and myalgias.  Skin: Negative for rash.  Neurological: Negative for dizziness, syncope, weakness, light-headedness and numbness.  Psychiatric/Behavioral: Negative for behavioral problems, confusion and sleep disturbance.   Immunization History  Administered Date(s) Administered   Influenza, High Dose Seasonal PF 09/23/2017, 09/28/2019   Influenza,inj,Quad PF,6+ Mos 09/16/2018   Influenza-Unspecified 09/14/2017, 09/26/2020   Moderna SARS-COV2 Booster Vaccination 10/23/2020, 05/14/2021   Moderna Sars-Covid-2 Vaccination 12/19/2019, 01/16/2020   Pertinent  Health Maintenance Due  Topic Date Due   DEXA SCAN  Never done   INFLUENZA VACCINE  07/15/2021   PNA vac Low Risk Adult  Completed   Fall Risk  03/07/2020 02/08/2019 12/29/2018 09/21/2018 05/01/2017  Falls in the past year? 0 0 0 No Yes  Number falls in past yr: 0 0 0 - 1  Injury with Fall? - 0 0 - No  Risk for fall due to : - - - - Impaired balance/gait   Functional Status Survey:    Vitals:   07/02/21 1001  BP: 128/64  Pulse: 80  Resp: 18  Temp: (!) 97.4 F (36.3 C)  SpO2: 90%  Weight: 137 lb 3.2 oz (62.2 kg)  Height: 5\' 1"  (1.549 m)   Body mass index is 25.92 kg/m. Physical Exam Constitutional:  Well-developed and well-nourished.  HENT:  Head: Normocephalic.  Mouth/Throat: Oropharynx is clear and moist.  Eyes: Pupils are equal, round, and  reactive to light.  Neck: Neck supple.  Cardiovascular: Normal rate and normal heart sounds.  No murmur heard. Pulmonary/Chest: Effort normal and breath sounds normal. No respiratory distress. No wheezes. She has no rales.  Abdominal: Soft. Bowel sounds are normal. No distension. There is no tenderness. There is no rebound.  Musculoskeletal: No edema.  Lymphadenopathy: none Neurological: No Focal Deficits Skin: Skin is warm and dry.  Psychiatric: Normal mood and affect. Behavior is normal. Thought content normal.  MMSE - Mini Mental State Exam 05/09/2021 10/25/2020 04/27/2020  Orientation to time 0 1 1  Orientation to Place 2 3 4   Registration 3 3 3   Attention/ Calculation 1 1 1   Recall 0 0 0  Language- name 2 objects 2 2 2   Language- repeat  0 1 1  Language- follow 3 step command 3 3 3   Language- read & follow direction 1 1 1   Write a sentence 1 1 1   Copy design 1 1 1   Total score 14 17 18     Labs reviewed: Recent Labs    09/10/20 0000 09/26/20 0000 03/29/21 0000  NA 132* 136* 138  K 4.3 4.6 4.3  CL 98* 100 101  CO2 28* 27* 29*  BUN 18 17 20   CREATININE 1.0 1.1 1.2*  CALCIUM 9.1 8.9 9.5   Recent Labs    09/10/20 0000 03/29/21 0000  AST 22 20  ALT 16 16  ALKPHOS 53 69  ALBUMIN 4.4 4.4   Recent Labs    09/10/20 0000 03/29/21 0000  WBC 7.3 7.0  NEUTROABS 4,504.00 3,703.00  HGB 12.8 12.9  HCT 37 39  PLT 245 267   Lab Results  Component Value Date   TSH 4.01 09/10/2020   No results found for: HGBA1C Lab Results  Component Value Date   CHOL 217 (A) 03/29/2021   HDL 48 03/29/2021   LDLCALC 152 03/29/2021   TRIG 70 03/29/2021   CHOLHDL 4.7 08/01/2019    Significant Diagnostic Results in last 30 days:  No results found.  Assessment/Plan Late onset Alzheimer's disease without behavioral disturbance (HCC) On Exelon Capsule and Namenda Last MMSE was 14/30 Staying independent in her ADLS  Mixed hyperlipidemia Refused statin Stage 3a chronic kidney  disease (HCC) Creat stable Depression, recurrent ([place]) On Zoloft and Remeron Hyponatremia Sodium is stable   Family/ staff Communication:   Labs/tests ordered:  none

## 2021-09-20 ENCOUNTER — Encounter: Payer: Self-pay | Admitting: Orthopedic Surgery

## 2021-09-20 ENCOUNTER — Encounter: Payer: Self-pay | Admitting: Nurse Practitioner

## 2021-09-20 NOTE — Progress Notes (Signed)
Error

## 2021-09-20 NOTE — Progress Notes (Signed)
Location:   [facility] Room Number: 889-V Place of Service:  ALF (13) Provider:  Mast, Man, NP     Patient Care Team: [name], MD as PCP - General (Internal Medicine) [name], MD as Referring Physician (Physical Medicine and Rehabilitation) Lajean Manes, MD as Consulting Physician (Internal Medicine)  Extended Emergency Contact Information Primary Emergency Contact: [name], [place] Phone: [phone] Relation: Daughter Secondary Emergency Contact: Myers,Lois  [place] Phone: [phone] Relation: Daughter  Code Status:  DNR Goals of care: Advanced Directive information Advanced Directives 09/20/2021  Does Patient Have a Medical Advance Directive? Yes  Type of Paramedic of [place];Living will;Out of facility DNR (pink MOST or yellow form)  Does patient want to make changes to medical advance directive? No - Patient declined  Copy of [place] in Chart? Yes - validated most recent copy scanned in chart (See row information)  Pre-existing out of facility DNR order (yellow form or pink MOST form) -     Chief Complaint  Patient presents with   Medical Management of Chronic Issues    Routine Visit.   Immunizations    Discuss the need for Tetanus vaccine, Influenza vaccine, and Shingrix vaccine.   Health Maintenance    Discuss the need for Dexa Scan.    HPI:  Pt is a 87 y.o. female seen today for medical management of chronic diseases.     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   Dyslipidemia    Fibromyalgia    Granuloma annulare    History of uterine cancer 2003   treated with hysterectomy   Lumbar  radicular syndrome    left L5   Macular degeneration    left eye   Sciatica    TIA (transient ischemic attack)    Past Surgical History:  Procedure Laterality Date   ABDOMINAL HYSTERECTOMY     BREAST BIOPSY  2007   CATARACT EXTRACTION     TONSILLECTOMY      Allergies  Allergen Reactions   Aricept [Donepezil Hcl]     Muscle cramps   Latex Itching   Tramadol Other (See Comments)    Pt can't remember what side effects she had, she doesn't take it now    Allergies as of 09/20/2021       Reactions   Aricept [donepezil Hcl]    Muscle cramps   Latex Itching   Tramadol Other (See Comments)   Pt can't remember what side effects she had, she doesn't take it now        Medication List        Accurate as of September 20, 2021 11:02 AM. If you have any questions, ask your nurse or doctor.          STOP taking these medications    LACTOBACILLUS PO Stopped by: Yvonna Alanis, NP       TAKE these medications    aspirin EC 81 MG tablet Take 81 mg by mouth daily.   CALCIUM-VITAMIN D PO Take 1 tablet by mouth daily. 600mg  calcium, 800 units Vit D   melatonin  3 MG Tabs tablet Take 3 mg by mouth at bedtime.   memantine 10 MG tablet Commonly known as: NAMENDA TAKE 1 TABLET BY MOUTH TWICE DAILY.   mirtazapine 30 MG tablet Commonly known as: REMERON TAKE ONE TABLET AT BEDTIME.   CENTRUM SILVER PO Take 2 tablets by mouth daily.   PRESERVISION AREDS PO Take 2 capsules by mouth daily.   rivastigmine 1.5 MG capsule Commonly known as: EXELON Take 1 capsule (1.5 mg total) by mouth 2 (two) times daily.   sertraline 50 MG tablet Commonly known as: ZOLOFT Take 1 tablet (50 mg total) by mouth daily.   UNABLE TO FIND Med Name: Hervey Ard Thought 1 capsule twice daily   vitamin B-12 500 MCG tablet Commonly known as: CYANOCOBALAMIN Take 500 mcg by mouth daily.        Review of Systems  Immunization History  Administered Date(s) Administered   Influenza, High Dose  Seasonal PF 09/23/2017, 09/28/2019   Influenza,inj,Quad PF,6+ Mos 09/16/2018   Influenza-Unspecified 09/14/2017, 09/26/2020   Moderna SARS-COV2 Booster Vaccination 10/23/2020, 05/14/2021   Moderna Sars-Covid-2 Vaccination 12/19/2019, 01/16/2020   Pertinent  Health Maintenance Due  Topic Date Due   DEXA SCAN  Never done   INFLUENZA VACCINE  07/15/2021   Fall Risk  03/07/2020 02/08/2019 12/29/2018 09/21/2018 05/01/2017  Falls in the past year? 0 0 0 No Yes  Number falls in past yr: 0 0 0 - 1  Injury with Fall? - 0 0 - No  Risk for fall due to : - - - - Impaired balance/gait   Functional Status Survey:    Vitals:   09/20/21 1100  BP: (!) 134/58  Pulse: 85  Resp: 18  Temp: 98.2 F (36.8 C)  SpO2: 94%  Weight: 134 lb 4.8 oz (60.9 kg)  Height: 5\' 1"  (1.549 m)   Body mass index is 25.38 kg/m. Physical Exam  Labs reviewed: Recent Labs    09/26/20 0000 03/29/21 0000  NA 136* 138  K 4.6 4.3  CL 100 101  CO2 27* 29*  BUN 17 20  CREATININE 1.1 1.2*  CALCIUM 8.9 9.5   Recent Labs    03/29/21 0000  AST 20  ALT 16  ALKPHOS 69  ALBUMIN 4.4   Recent Labs    03/29/21 0000  WBC 7.0  NEUTROABS 3,703.00  HGB 12.9  HCT 39  PLT 267   Lab Results  Component Value Date   TSH 4.01 09/10/2020   No results found for: HGBA1C Lab Results  Component Value Date   CHOL 217 (A) 03/29/2021   HDL 48 03/29/2021   LDLCALC 152 03/29/2021   TRIG 70 03/29/2021   CHOLHDL 4.7 08/01/2019    Significant Diagnostic Results in last 30 days:  No results found.  Assessment/Plan There are no diagnoses linked to this encounter.   Family/ staff Communication:   Labs/tests ordered:

## 2021-09-30 ENCOUNTER — Non-Acute Institutional Stay: Payer: Medicare Other | Admitting: Nurse Practitioner

## 2021-09-30 ENCOUNTER — Encounter: Payer: Self-pay | Admitting: Nurse Practitioner

## 2021-09-30 DIAGNOSIS — N1832 Chronic kidney disease, stage 3b: Secondary | ICD-10-CM

## 2021-09-30 DIAGNOSIS — F0390 Unspecified dementia without behavioral disturbance: Secondary | ICD-10-CM | POA: Diagnosis not present

## 2021-09-30 DIAGNOSIS — E538 Deficiency of other specified B group vitamins: Secondary | ICD-10-CM

## 2021-09-30 DIAGNOSIS — F339 Major depressive disorder, recurrent, unspecified: Secondary | ICD-10-CM

## 2021-09-30 DIAGNOSIS — E782 Mixed hyperlipidemia: Secondary | ICD-10-CM

## 2021-09-30 NOTE — Progress Notes (Signed)
Location:   [facility] Room Number: 802 Place of Service:  ALF (13) Provider: Lennie Odor  NP  [name], MD  Patient Care Team: [name], MD as PCP - General (Internal Medicine) [name], MD as Referring Physician (Physical Medicine and Rehabilitation) Lajean Manes, MD as Consulting Physician (Internal Medicine)  Extended Emergency Contact Information Primary Emergency Contact: [name],  [place] Phone: [phone] Relation: Daughter Secondary Emergency Contact: Myers,Lois  [place] Phone: [phone] Relation: Daughter  Code Status:  DNR Goals of care: Advanced Directive information Advanced Directives 09/20/2021  Does Patient Have a Medical Advance Directive? Yes  Type of Paramedic of [place];Living will;Out of facility DNR (pink MOST or yellow form)  Does patient want to make changes to medical advance directive? No - Patient declined  Copy of [place] in Chart? Yes - validated most recent copy scanned in chart (See row information)  Pre-existing out of facility DNR order (yellow form or pink MOST form) -     Chief Complaint  Patient presents with   Medical Management of Chronic Issues    HPI:  Pt is a 87 y.o. female seen today for medical management of chronic diseases.       Hx of Hyperlipidemia, not taking statin, LDL 140 08/01/19, 148 09/10/20, 152 03/28/21, takes ASA 38m qd, refused statin.              CKD Bun/creat 20/1.16 eGFR 43 03/28/21             Depression, takes Mirtazapine, Sertraline, TSH 4.01 09/10/20             Dementia, MMSE 14/30, takes Exelon, Memantine, [facility] underwent Neurology consultation.              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   Dyslipidemia    Fibromyalgia    Granuloma annulare    History of uterine cancer 2003   treated with hysterectomy   Lumbar radicular syndrome    left L5   Macular degeneration    left eye   Sciatica    TIA (transient ischemic attack)    Past Surgical History:  Procedure Laterality Date   ABDOMINAL HYSTERECTOMY     BREAST BIOPSY  2007   CATARACT EXTRACTION     TONSILLECTOMY      Allergies  Allergen Reactions   Aricept [Donepezil Hcl]     Muscle cramps   Latex Itching   Tramadol Other (See Comments)    Pt can't remember what side effects she had, she doesn't take it now    Allergies as of 09/30/2021       Reactions   Aricept [donepezil Hcl]    Muscle cramps   Latex Itching   Tramadol Other (See Comments)   Pt can't remember what side effects she had, she doesn't take it now        Medication List        Accurate as of September 30, 2021  2:21 PM. If you have any questions, ask your nurse or doctor.          aspirin EC 81 MG tablet Take 81 mg by mouth daily.   CALCIUM-VITAMIN D PO Take 1 tablet by mouth daily. 619m calcium, 800 units Vit D   melatonin 3 MG Tabs tablet Take 3 mg by mouth at bedtime.   memantine 10 MG tablet Commonly known as: NAMENDA TAKE 1 TABLET BY MOUTH TWICE DAILY.   mirtazapine 30 MG tablet Commonly known as: REMERON TAKE ONE TABLET AT BEDTIME.   CENTRUM SILVER PO Take 2 tablets by mouth daily.   PRESERVISION AREDS PO Take 2 capsules by mouth daily.   rivastigmine 1.5 MG capsule Commonly known as: EXELON Take 1 capsule (1.5 mg total) by mouth 2 (two) times daily.   sertraline 50 MG tablet Commonly known as: ZOLOFT Take 1 tablet (50 mg total) by mouth daily.   UNABLE TO FIND Med Name: SHervey ArdThought 1 capsule twice daily   vitamin B-12 500 MCG  tablet Commonly known as: CYANOCOBALAMIN Take 500 mcg by mouth daily.        Review of Systems  Constitutional:  Negative for fatigue, fever and unexpected weight change.  HENT:  Positive for hearing loss. Negative for congestion, trouble swallowing and voice change.   Eyes:  Negative for visual disturbance.  Respiratory:  Negative for cough and shortness of breath.   Cardiovascular:  Negative for leg swelling.  Gastrointestinal:  Negative for abdominal pain and constipation.  Genitourinary:  Negative for dysuria, frequency and urgency.  Musculoskeletal:  Positive for gait problem.  Skin:  Negative for color change.  Neurological:  Negative for speech difficulty and headaches.       Dementia  Psychiatric/Behavioral:  Negative for behavioral problems and sleep disturbance. The patient is not nervous/anxious.    Immunization History  Administered Date(s) Administered   Influenza, High Dose Seasonal PF 09/23/2017, 09/28/2019   Influenza,inj,Quad PF,6+ Mos 09/16/2018   Influenza-Unspecified 09/14/2017, 09/26/2020   Moderna SARS-COV2 Booster Vaccination 10/23/2020, 05/14/2021   Moderna Sars-Covid-2 Vaccination 12/19/2019, 01/16/2020   Pertinent  Health Maintenance Due  Topic Date Due   DEXA SCAN  Never done   INFLUENZA VACCINE  07/15/2021   Fall Risk  03/07/2020 02/08/2019 12/29/2018 09/21/2018 05/01/2017  Falls in the past year? 0 0 0 No Yes  Number falls in past yr: 0 0 0 - 1  Injury with Fall? - 0 0 - No  Risk for fall due to : - - - - Impaired balance/gait   Functional Status Survey:    Vitals:   09/30/21 1405  BP: (!) 134/58  Pulse: 85  Resp: 18  Temp: 98.2 F (36.8 C)  SpO2: 94%  Weight: 134 lb 12.8 oz (61.1 kg)  Height: 5' 1" (1.549 m)   Body mass index is 25.47 kg/m. Physical Exam Vitals and nursing note reviewed.  Constitutional:      Appearance: Normal appearance.  HENT:     Head: Normocephalic and atraumatic.     Mouth/Throat:     Mouth: Mucous  membranes are moist.  Eyes:     Extraocular Movements: Extraocular movements intact.     Conjunctiva/sclera: Conjunctivae normal.     Pupils: Pupils are equal, round, and reactive to light.  Cardiovascular:     Rate and Rhythm: Normal rate and regular rhythm.     Heart sounds: No murmur heard. Pulmonary:     Breath sounds: No rales.  Abdominal:  General: Bowel sounds are normal.     Palpations: Abdomen is soft.     Tenderness: There is no abdominal tenderness.  Musculoskeletal:     Cervical back: Normal range of motion and neck supple.     Right lower leg: No edema.     Left lower leg: No edema.  Skin:    General: Skin is warm and dry.  Neurological:     General: No focal deficit present.     Mental Status: She is alert. Mental status is at baseline.     Motor: No weakness.     Coordination: Coordination normal.     Gait: Gait abnormal.     Comments: Oriented to person, place.   Psychiatric:        Mood and Affect: Mood normal.        Behavior: Behavior normal.    Labs reviewed: Recent Labs    03/29/21 0000  NA 138  K 4.3  CL 101  CO2 29*  BUN 20  CREATININE 1.2*  CALCIUM 9.5   Recent Labs    03/29/21 0000  AST 20  ALT 16  ALKPHOS 69  ALBUMIN 4.4   Recent Labs    03/29/21 0000  WBC 7.0  NEUTROABS 3,703.00  HGB 12.9  HCT 39  PLT 267   Lab Results  Component Value Date   TSH 4.01 09/10/2020   No results found for: HGBA1C Lab Results  Component Value Date   CHOL 217 (A) 03/29/2021   HDL 48 03/29/2021   LDLCALC 152 03/29/2021   TRIG 70 03/29/2021   CHOLHDL 4.7 08/01/2019    Significant Diagnostic Results in last 30 days:  No results found.  Assessment/Plan  CKD (chronic kidney disease) stage 3, GFR 30-59 ml/min (HCC) Bun/creat 20/1.16 eGFR 43 03/28/21  Depression, recurrent ([place]) Her mood is stable, takes Mirtazapine, Sertraline, TSH 4.01 09/10/20  Senile dementia ([place]) MMSE 14/30, takes Exelon, Memantine, resides [facility] for [facility],  care assistance. underwent Neurology consultation.   Vitamin B12 deficiency takes Vit B12, Vit B12 1010 03/02/20, Hgb 12.9 03/28/21   Family/ staff Communication: plan of care reviewed with the patient and charge nurse.   Labs/tests ordered:  none  Time spend 40 minutes.

## 2021-09-30 NOTE — Assessment & Plan Note (Signed)
Bun/creat 20/1.16 eGFR 43 03/28/21

## 2021-09-30 NOTE — Assessment & Plan Note (Signed)
MMSE 14/30, takes Exelon, Memantine, resides Al [facility] for safety, care assistance. underwent Neurology consultation.

## 2021-09-30 NOTE — Assessment & Plan Note (Signed)
Her mood is stable, takes Mirtazapine, Sertraline, TSH 4.01 09/10/20

## 2021-09-30 NOTE — Assessment & Plan Note (Signed)
refused statin. LDL 152 03/28/21

## 2021-09-30 NOTE — Progress Notes (Signed)
This encounter was created in error - please disregard.

## 2021-09-30 NOTE — Assessment & Plan Note (Signed)
takes Vit B12, Vit B12 1010 03/02/20, Hgb 12.9 03/28/21

## 2021-11-12 ENCOUNTER — Ambulatory Visit: Payer: Medicare Other | Admitting: Neurology

## 2021-11-13 ENCOUNTER — Non-Acute Institutional Stay (INDEPENDENT_AMBULATORY_CARE_PROVIDER_SITE_OTHER): Payer: Medicare Other | Admitting: Nurse Practitioner

## 2021-11-13 ENCOUNTER — Encounter: Payer: Self-pay | Admitting: Nurse Practitioner

## 2021-11-13 DIAGNOSIS — Z0000 Encounter for general adult medical examination without abnormal findings: Secondary | ICD-10-CM | POA: Diagnosis not present

## 2021-11-14 ENCOUNTER — Encounter: Payer: Self-pay | Admitting: Nurse Practitioner

## 2021-11-14 NOTE — Progress Notes (Addendum)
Subjective:   Janet [name] is a 87 y.o. female who presents for Medicare Annual (Subsequent) preventive examination at [facility].      Objective:    Today's Vitals   11/13/21 1140  BP: 134/68  Pulse: 83  Resp: 18  Temp: (!) 97.4 F (36.3 C)  SpO2: 97%  Weight: 134 lb 4.8 oz (60.9 kg)  Height: 5\' 1"  (1.549 m)   Body mass index is 25.38 kg/m.  Advanced Directives 02/04/2022 11/13/2021 09/20/2021 09/20/2021 07/02/2021 04/03/2021 03/21/2021  Does Patient Have a Medical Advance Directive? Yes Yes Yes Yes Yes Yes Yes  Type of Paramedic of [place];Living will;Out of facility DNR (pink MOST or yellow form) [place];Living will;Out of facility DNR (pink MOST or yellow form) [place];Living will;Out of facility DNR (pink MOST or yellow form) [place];Living will;Out of facility DNR (pink MOST or yellow form) [place];Living will;Out of facility DNR (pink MOST or yellow form) [place];Living will;Out of facility DNR (pink MOST or yellow form) [place];Living will;Out of facility DNR (pink MOST or yellow form)  Does patient want to make changes to medical advance directive? No - Patient declined No - Patient declined No - Patient declined No - Patient declined No - Patient declined No - Patient declined No - Patient declined  Copy of [place] in Chart? Yes - validated most recent copy scanned in chart (See row information) Yes - validated most recent copy scanned in chart (See row information) Yes - validated most recent copy scanned in chart (See row information) Yes - validated most recent copy scanned in chart (See row information) Yes - validated most recent copy scanned in chart (See row information) Yes - validated most recent copy scanned in chart (See row information) Yes - validated most recent copy scanned  in chart (See row information)  Pre-existing out of facility DNR order (yellow form or pink MOST form) Pink MOST form placed in chart (order not valid for inpatient use) - - - Pink MOST form placed in chart (order not valid for inpatient use) Pink MOST form placed in chart (order not valid for inpatient use) -    Current Medications (verified) Outpatient Encounter Medications as of 11/13/2021  Medication Sig   aspirin EC 81 MG tablet Take 81 mg by mouth daily.   CALCIUM-VITAMIN D PO Take 1 tablet by mouth daily. 600mg  calcium, 800 units Vit D   melatonin 3 MG TABS tablet Take 3 mg by mouth at bedtime.   memantine (NAMENDA) 10 MG tablet TAKE 1 TABLET BY MOUTH TWICE DAILY.   mirtazapine (REMERON) 30 MG tablet TAKE ONE TABLET AT BEDTIME.   Multiple Vitamins-Minerals (CENTRUM SILVER PO) Take 2 tablets by mouth daily.    Multiple Vitamins-Minerals (PRESERVISION AREDS PO) Take 2 capsules by mouth daily.    rivastigmine (EXELON) 1.5 MG capsule Take 1 capsule (1.5 mg total) by mouth 2 (two) times daily.   sertraline (ZOLOFT) 50 MG tablet Take 1 tablet (50 mg total) by mouth daily.   UNABLE TO FIND Med Name: Hervey Ard Thought 1 capsule twice daily   vitamin B-12 (CYANOCOBALAMIN) 500 MCG tablet Take 500 mcg by mouth daily.   No facility-administered encounter medications on file as of 11/13/2021.    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   Dyslipidemia    Fibromyalgia    Granuloma annulare    History of uterine cancer 2003   treated with hysterectomy   Lumbar radicular syndrome    left L5   Macular degeneration    left eye   Sciatica    TIA (transient ischemic attack)    Past Surgical History:  Procedure  Laterality Date   ABDOMINAL HYSTERECTOMY     BREAST BIOPSY  2007   CATARACT EXTRACTION     TONSILLECTOMY     Family History  Problem Relation Age of Onset   Depression Mother    Diabetes Father    Heart attack Father    Dementia Sister    Pneumonia Brother    Breast cancer Daughter    Social History   Socioeconomic History   Marital status: Widowed    Spouse name: Not on file   Number of children: Not on file   Years of education: 16   Highest education level: Not on file  Occupational History   Occupation: Retired Warden/ranger  Tobacco Use   Smoking status: Never   Smokeless tobacco: Never  Vaping Use   Vaping Use: Never used  Substance and Sexual Activity   Alcohol use: No   Drug use: No   Sexual activity: Not on file  Other Topics Concern   Not on file  Social History Narrative   Lives at [facility]   Left-handed    Caffeine: tea once a day   Social Determinants of Health   Financial Resource Strain: Not on file  Food Insecurity: Not on file  Transportation Needs: Not on file  Physical Activity: Not on file  Stress: Not on file  Social Connections: Not on file    Tobacco Counseling Counseling given: Not Answered    Clinical Intake:  Pre-visit preparation completed: Yes  Pain : No/denies pain     BMI - recorded: 25.38 Nutritional Status: BMI 25 -29 Overweight Nutritional Risks: None  How often do you need to have someone help you when you read instructions, pamphlets, or other written materials from your doctor or pharmacy?: 3 - Sometimes What is the last grade level you completed in school?: college  Diabetic? no  Interpreter Needed?: No  Information entered by :: Brinae [place] Bretta Bang NP   Activities of Daily Living In your present state of health, do you have any difficulty performing the following activities: 11/14/2021  Hearing? N  Vision? N  Difficulty concentrating or making decisions? Y  Walking or  climbing stairs? Y  Dressing or bathing? Y  Doing errands, shopping? Y  Preparing Food and eating ? N  Using the Toilet? N  In the past six months, have you accidently leaked urine? N  Do you have problems with loss of bowel control? N  Managing your Medications? Y  Managing your Finances? Y  Housekeeping or managing your Housekeeping? Y  Some recent data might be hidden    Patient Care Team: [name], MD as PCP - General (Internal Medicine) [name], MD as Referring Physician (Physical Medicine and Rehabilitation) Lajean Manes, MD as Consulting Physician (Internal Medicine)  Indicate any recent Medical Services you may have received from other than [facility] providers in the  past year (date may be approximate).     Assessment:   This is a routine wellness examination for [place].  Hearing/Vision screen No results found.  Dietary issues and exercise activities discussed: Current Exercise Habits: The patient does not participate in regular exercise at present, Exercise limited by: psychological condition(s)   Goals Addressed             This Visit's Progress    Maintain Mobility and Function       Evidence-based guidance:  Emphasize the importance of physical activity and aerobic exercise as included in treatment plan; assess barriers to adherence; consider patient's abilities and preferences.  Encourage gradual increase in activity or exercise instead of stopping if pain occurs.  Reinforce individual therapy exercise prescription, such as strengthening, stabilization and stretching programs.  Promote optimal body mechanics to stabilize the spine with lifting and functional activity.  Encourage activity and mobility modifications to facilitate optimal function, such as using a log roll for bed mobility or dressing from a seated position.  Reinforce individual adaptive equipment recommendations to limit excessive spinal movements, such as a Systems analyst.   Assess adequacy of sleep; encourage use of sleep hygiene techniques, such as bedtime routine; use of white noise; dark, cool bedroom; avoiding daytime naps, heavy meals or exercise before bedtime.  Promote positions and modification to optimize sleep and sexual activity; consider pillows or positioning devices to assist in maintaining neutral spine.  Explore options for applying ergonomic principles at work and home, such as frequent position changes, using ergonomically designed equipment and working at optimal height.  Promote modifications to increase comfort with driving such as lumbar support, optimizing seat and steering wheel position, using cruise control and taking frequent rest stops to stretch and walk.   Notes:        Depression Screen PHQ 2/9 Scores 04/21/2018 04/21/2018 02/15/2018  PHQ - 2 Score 1 1 1   PHQ- 9 Score 4 - -    Fall Risk Fall Risk  03/07/2020 02/08/2019 12/29/2018 09/21/2018 05/01/2017  Falls in the past year? 0 0 0 No Yes  Number falls in past yr: 0 0 0 - 1  Injury with Fall? - 0 0 - No  Risk for fall due to : - - - - Impaired balance/gait    FALL RISK PREVENTION PERTAINING TO THE HOME:  Any stairs in or around the home? Yes  If so, are there any without handrails? No  Home free of loose throw rugs in walkways, pet beds, electrical cords, etc? Yes  Adequate lighting in your home to reduce risk of falls? Yes   ASSISTIVE DEVICES UTILIZED TO PREVENT FALLS:  Life alert? No  Use of a cane, walker or w/c? Yes  Grab bars in the bathroom? Yes  Shower chair or bench in shower? Yes  Elevated toilet seat or a handicapped toilet? Yes   TIMED UP AND GO:  Was the test performed? Yes .  Length of time to ambulate 10 feet: 15 sec.   Gait slow and steady with assistive device  Cognitive Function: MMSE - Mini Mental State Exam 11/19/2021 05/09/2021 10/25/2020 04/27/2020 08/10/2019  Orientation to time 3 0 1 1 4   Orientation to Place 5 2 3 4 5   Registration 3 3 3 3 3    Attention/ Calculation 0 1 1 1 5   Recall 0 0 0 0 0  Language- name 2 objects 2 2 2 2 2   Language- repeat 1 0 1 1 1   Language- follow 3  step command 2 3 3 3 3   Language- read & follow direction 1 1 1 1 1   Write a sentence 1 1 1 1 1   Copy design 1 1 1 1 1   Total score 19 14 17 18 26         Immunizations Immunization History  Administered Date(s) Administered   Influenza, High Dose Seasonal PF 09/23/2017, 09/28/2019   Influenza,inj,Quad PF,6+ Mos 09/16/2018   Influenza-Unspecified 09/14/2017, 09/26/2020, 10/03/2021   Moderna SARS-COV2 Booster Vaccination 10/23/2020, 05/14/2021   Moderna Sars-Covid-2 Vaccination 12/19/2019, 01/16/2020, 05/14/2021   Pneumococcal-Unspecified 12/15/2001   Zoster, Unspecified 12/15/2005    TDAP status: Due, Education has been provided regarding the importance of this vaccine. Advised may receive this vaccine at local pharmacy or Health Dept. Aware to provide a copy of the vaccination record if obtained from local pharmacy or Health Dept. Verbalized acceptance and understanding.  Flu Vaccine status: Up to date  Pneumococcal vaccine: recommend to receive this vaccine.   Covid-19 vaccine status: Information provided on how to obtain vaccines.   Qualifies for Shingles Vaccine? Yes   Zostavax completed No   Shingrix Completed?: No.    Education has been provided regarding the importance of this vaccine. Patient has been advised to call insurance company to determine out of pocket expense if they have not yet received this vaccine. Advised may also receive vaccine at local pharmacy or Health Dept. Verbalized acceptance and understanding.  Screening Tests Health Maintenance  Topic Date Due   Zoster Vaccines- Shingrix (1 of 2) 06/21/1984   Pneumonia Vaccine 41+ Years old (1 - PCV) 06/22/1999   DEXA SCAN  Never done   TETANUS/TDAP  06/07/2020   COVID-19 Vaccine (4 - Booster for Moderna series) 05/29/2022 (Originally 07/09/2021)   INFLUENZA VACCINE   Completed   HPV VACCINES  Aged Out    Health Maintenance  Health Maintenance Due  Topic Date Due   Zoster Vaccines- Shingrix (1 of 2) 06/21/1984   Pneumonia Vaccine 63+ Years old (1 - PCV) 06/22/1999   DEXA SCAN  Never done   TETANUS/TDAP  06/07/2020    Colorectal cancer screening: No longer required.   Mammogram status: No longer required due to aged out.  Bone Density will order DEXA if the patient desires.   Lung Cancer Screening: (Low Dose CT Chest recommended if Age 80-80 years, 30 pack-year currently smoking OR have quit w/in 15years.) does not qualify.    Hepatitis C Screening: does not qualify  Vision Screening: Recommended annual ophthalmology exams for early detection of glaucoma and other disorders of the eye. Is the patient up to date with their annual eye exam?  No  Who is the provider or what is the name of the office in which the patient attends annual eye exams? Will refer if the patient desires.  If pt is not established with a provider, would they like to be referred to a provider to establish care? No .   Dental Screening: Recommended annual dental exams for proper oral hygiene  Community Resource Referral / Chronic Care Management: CRR required this visit?  No   CCM required this visit?  No      Plan:     I have personally reviewed and noted the following in the patient's chart:   Medical and social history Use of alcohol, tobacco or illicit drugs  Current medications and supplements including opioid prescriptions.  Functional ability and status Nutritional status Physical activity Advanced directives List of other physicians Hospitalizations, surgeries, and ER  visits in previous 12 months Vitals Screenings to include cognitive, depression, and falls Referrals and appointments  In addition, I have reviewed and discussed with patient certain preventive protocols, quality metrics, and best practice recommendations. A written personalized care  plan for preventive services as well as general preventive health recommendations were provided to patient.     Casandra Dallaire X Kinzley Savell, NP   02/04/2022

## 2021-11-19 ENCOUNTER — Ambulatory Visit (INDEPENDENT_AMBULATORY_CARE_PROVIDER_SITE_OTHER): Payer: Medicare Other | Admitting: Neurology

## 2021-11-19 ENCOUNTER — Encounter: Payer: Self-pay | Admitting: Neurology

## 2021-11-19 VITALS — BP 143/69 | HR 92 | Ht 61.0 in | Wt 147.5 lb

## 2021-11-19 DIAGNOSIS — R293 Abnormal posture: Secondary | ICD-10-CM

## 2021-11-19 DIAGNOSIS — G301 Alzheimer's disease with late onset: Secondary | ICD-10-CM

## 2021-11-19 DIAGNOSIS — F02B3 Dementia in other diseases classified elsewhere, moderate, with mood disturbance: Secondary | ICD-10-CM

## 2021-11-19 DIAGNOSIS — R292 Abnormal reflex: Secondary | ICD-10-CM | POA: Insufficient documentation

## 2021-11-19 NOTE — Patient Instructions (Signed)
Dementia Caregiver Guide °Dementia is a term used to describe a number of symptoms that affect memory and thinking. The most common symptoms include: °Memory loss. °Trouble with language and communication. °Trouble concentrating. °Poor judgment and problems with reasoning. °Wandering from home or public places. °Extreme anxiety or depression. °Being suspicious or having angry outbursts and accusations. °Child-like behavior and language. °Dementia can be frightening and confusing. And taking care of someone with dementia can be challenging. This guide provides tips to help you when providing care for a person with dementia. °How to help manage lifestyle changes °Dementia usually gets worse slowly over time. In the early stages, people with dementia can stay independent and safe with some help. In later stages, they need help with daily tasks such as dressing, grooming, and using the bathroom. There are actions you can take to help a person manage his or her life while living with this condition. °Communicating °When the person is talking or seems frustrated, make eye contact and hold the person's hand. °Ask specific questions that need yes or no answers. °Use simple words, short sentences, and a calm voice. Only give one direction at a time. °When offering choices, limit the person to just one or two. °Avoid correcting the person in a negative way. °If the person is struggling to find the right words, gently try to help him or her. °Preventing injury ° °Keep floors clear of clutter. Remove rugs, magazine racks, and floor lamps. °Keep hallways well lit, especially at night. °Put a handrail and nonslip mat in the bathtub or shower. °Put childproof locks on cabinets that contain dangerous items, such as medicines, alcohol, guns, toxic cleaning items, sharp tools or utensils, matches, and lighters. °For doors to the outside of the house, put the locks in places where the person cannot see or reach them easily. This will  help ensure that the person does not wander out of the house and get lost. °Be prepared for emergencies. Keep a list of emergency phone numbers and addresses in a convenient area. °Remove car keys and lock garage doors so that the person does not try to get in the car and drive. °Have the person wear a bracelet that tracks locations and identifies the person as having memory problems. This should be worn at all times for safety. °Helping with daily life ° °Keep the person on track with his or her routine. °Try to identify areas where the person may need help. °Be supportive, patient, calm, and encouraging. °Gently remind the person that adjusting to changes takes time. °Help with the tasks that the person has asked for help with. °Keep the person involved in daily tasks and decisions as much as possible. °Encourage conversation, but try not to get frustrated if the person struggles to find words or does not seem to appreciate your help. °How to recognize stress °Look for signs of stress in yourself and in the person you are caring for. If you notice signs of stress, take steps to manage it. Symptoms of stress include: °Feeling anxious, irritable, frustrated, or angry. °Denying that the person has dementia or that his or her symptoms will not improve. °Feeling depressed, hopeless, or unappreciated. °Difficulty sleeping. °Difficulty concentrating. °Developing stress-related health problems. °Feeling like you have too little time for your own life. °Follow these instructions at home: °Take care of your health °Make sure that you and the person you are caring for: °Get regular sleep. °Exercise regularly. °Eat regular, nutritious meals. °Take over-the-counter and prescription medicines only   as told by your health care providers. °Drink enough fluid to keep your urine pale yellow. °Attend all scheduled health care appointments. ° °General instructions °Join a support group with others who are caregivers. °Ask about  respite care resources. Respite care can provide short-term care for the person so that you can have a regular break from the stress of caregiving. °Consider any safety risks and take steps to avoid them. °Organize medicines in a pill box for each day of the week. °Create a plan to handle any legal or financial matters. Get legal or financial advice if needed. °Keep a calendar in a central location to remind the person of appointments or other activities. °Where to find support: °Many individuals and organizations offer support. These include: °Support groups for people with dementia. °Support groups for caregivers. °Counselors or therapists. °Home health care services. °Adult day care centers. °Where to find more information °Centers for Disease Control and Prevention: www.cdc.gov °Alzheimer's Association: www.alz.org °Family Caregiver Alliance: www.caregiver.org °Alzheimer's Foundation of [place]: www.alzfdn.org °Contact a health care provider if: °The person's health is rapidly getting worse. °You are no longer able to care for the person. °Caring for the person is affecting your physical and emotional health. °You are feeling depressed or anxious about caring for the person. °Get help right away if: °The person threatens himself or herself, you, or anyone else. °You feel depressed or sad, or feel that you want to harm yourself. °If you ever feel like your loved one may hurt himself or herself or others, or if he or she shares thoughts about taking his or her own life, get help right away. You can go to your nearest emergency department or: °Call your local emergency services (911 in the [place] °Call a suicide crisis helpline, such as the National Suicide Prevention Lifeline at 1-800-273-8255 or 988 in the [place] This is open 24 hours a day in the [place] °Text the Crisis Text Line at 741741 (in the [place] °Summary °Dementia is a term used to describe a number of symptoms that affect memory and thinking. °Dementia  usually gets worse slowly over time. °Take steps to reduce the person's risk of injury and to plan for future care. °Caregivers need support, relief from caregiving, and time for their own lives. °This information is not intended to replace advice given to you by your health care provider. Make sure you discuss any questions you have with your health care provider. °Document Revised: 06/26/2021 Document Reviewed: 04/16/2020 °Elsevier Patient Education © 2022 [employer] ° °

## 2021-11-19 NOTE — Progress Notes (Signed)
Provider:  Larey Seat, M D  Referring Provider: [name], MD Primary Care Physician:  Janet Dad, MD  Chief Complaint  Patient presents with   Follow-up    Rm 10, w daughter. Here for 6 month memory f/u. Pt reports no changes in memory since last OV. Has had loose stool. Could be dietary, unsure if Exelon or probiotic has anything to do. MMSE:19    HPI:  Janet [name] is a 87 y.o. female  Is seen here as a referral/ revisit  from Dr. Lyndel [name] for transfer of care in dementia .     11-19-2021:  [name] is an 87 year old left-handed white female with a history of a progressive memory disturbance.  The patient is on Namenda and low-dose Exelon, she cannot tolerate doses greater than 1.5 mg twice daily of the Exelon.  The patient resided at [facility] and moved to assisted living in 2021.,She is socially active, she is now involved in tai chi classes, she seems to enjoy her life there.  The patient has not had any falls, she uses a cane consistently when she is outside of her room.  The patient is eating well, she has developed some swelling in the legs that seems to go down overnight.  The patient previously has had some problems with low sodium levels. Her diet is low salt, not by choice , but because that's what is served at her senior residence.  She has a decreased creatinine clearance , CKD 2.   MMSE - Mini Mental State Exam 11/19/2021 05/09/2021 10/25/2020 04/27/2020 08/10/2019 01/31/2019 05/21/2018  Orientation to time 3 0 _0 Orientation to Place _1 Registration _2 Attention/ Calculation 0 _3 Recall 0 0 0 0 0 0 1  Language- name 2 objects _4 Language- repeat 1 0 _5 Language- follow 3 step command _6 Language- read & follow direction _7 Write a sentence _8 Copy design _9 Total score _10 Review of Systems: Out of a complete 14  system review, the patient complains of only the following symptoms, and all other reviewed systems are negative.   Social History   Socioeconomic History   Marital status: Widowed    Spouse name: Not on file   Number of children: Daughter Janet [name] in [place], and Quanah lives in [place].    Years of education: 72   Highest education level: Not on file  Occupational History   Occupation: Retired Warden/ranger  Tobacco Use   Smoking status: Never   Smokeless tobacco: Never  Vaping Use   Vaping Use: Never used  Substance and Sexual Activity   Alcohol use: No   Drug use: No   Sexual activity: Not on file  Other Topics Concern   Not on file  Social History Narrative   Lives at [facility]   Left-handed    Caffeine: tea once a day   Social Determinants of Health   Financial Resource Strain: Not on file  Food Insecurity: Not on file  Transportation Needs: Not on  file  Physical Activity: Not on file  Stress: Not on file  Social Connections: Not on file  Intimate Partner Violence: Not on file    Family History  Problem Relation Age of Onset   Depression Mother    Diabetes Father    Heart attack Father    Dementia Sister    Pneumonia Brother    Breast cancer Daughter     Past Medical History:  Diagnosis Date   BPV (benign positional vertigo)    Cancer ([facility])    uterine cancer   Carpal tunnel syndrome on left 06/02/2017   Cognitive changes    Depression    Droopy eyelid, right    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     CALCIUM-VITAMIN D PO Take 1 tablet by mouth daily. 672m calcium, 800 units Vit D     melatonin 3 MG TABS tablet Take 3 mg by mouth at bedtime.     memantine (NAMENDA) 10 MG tablet TAKE 1 TABLET BY MOUTH TWICE DAILY. 180 tablet 0   mirtazapine (REMERON) 30 MG tablet TAKE ONE TABLET AT BEDTIME. 90 tablet 0   Multiple Vitamins-Minerals (CENTRUM SILVER PO) Take 2 tablets by mouth daily.      Multiple Vitamins-Minerals (PRESERVISION AREDS PO) Take 2 capsules by mouth daily.      rivastigmine (EXELON) 1.5 MG capsule Take 1 capsule (1.5 mg total) by mouth 2 (two) times daily. 180 capsule 3   sertraline (ZOLOFT) 50 MG tablet Take 1 tablet (50 mg total) by mouth daily. 30 tablet 3   UNABLE TO FIND Med Name: SHervey ArdThought 1 capsule twice daily     vitamin B-12 (CYANOCOBALAMIN) 500 MCG tablet Take 500 mcg by mouth daily.     No current facility-administered medications for this visit.    Allergies as of 11/19/2021 - Review Complete 11/19/2021  Allergen Reaction Noted   Aricept [donepezil hcl]  01/06/2017   Latex Itching 07/25/2014   Tramadol Other (See Comments) 07/25/2014    Vitals: BP (!) 143/69   Pulse 92   Ht _0  (1.549 m)   Wt 147 lb 8 oz (66.9 kg)   BMI 27.87 kg/m  Last Weight:  Wt Readings from Last 1 Encounters:  11/19/21 147 lb 8 oz (66.9 kg)   Last Height:   Ht Readings from Last 1 Encounters:  11/19/21 _1  (1.549 m)    Physical exam:  General: The patient is awake, alert and appears not in acute distress.  The patient is well groomed. Head: Normocephalic, atraumatic. Neck is supple. Drop head- slight stooped.  Cardiovascular:  Regular rate and rhythm, without  murmurs or carotid bruit, and without distended neck veins. Respiratory: Lungs are clear to auscultation. Skin:  Without evidence of edema, or rash Trunk: BMI is 27 elevated . Neurologic exam : The patient is awake and  alert, oriented to place and time.  Memory subjective  described as stabile There is a normal attention span & concentration ability. Speech is fluent without  dysarthria, dysphonia or aphasia. Mood and affect are appropriate.  Cranial nerves: Pupils are equal and briskly reactive to light. Extraocular movements  in vertical and horizontal planes intact and without nystagmus.  Visual fields by finger perimetry are intact.macular degeneration.  Hearing to finger rub intact.   Facial sensation intact to fine touch.  Facial motor strength is symmetric and tongue and uvula move midline. Tongue protrusion into either cheek is normal. Shoulder shrug is normal.   Motor exam:   elevated  tone , reduced muscle bulk and symmetric  strength in all extremities. Grip strength weakness.  Bilateral atrophy of the thenar eminence red palms, some tremor.   Sensory:  Fine touch, pinprick and vibration were tested in all extremities.  Proprioception was intact   Coordination: Rapid alternating movements in the fingers/hands were normal. Finger-to-nose maneuver  normal without evidence of ataxia, dysmetria but there is action tremor- .  Gait and station: Patient walks with a cane for assistive device . She is in PT-  scoliosis, keeps cane in the right hand, non-dominant hand.  Stooped,  turns with 6 steps. Not shuffling.   Stance is stable. Slow walker,   Deep tendon reflexes: in the upper and lower extremities are symmetric and BRISK_ . Babinski maneuver response is  downgoing.   Assessment:  After physical and neurologic examination, review of laboratory studies, imaging, neurophysiology testing and pre-existing records, assessment is that of :  Dementia , this is not MCI- a 19/ 30 score on MMSE is dementia in the moderate category.  MRI: 12/ 2017   IMPRESSION:  Abnormal MRI brain (without) demonstrating: 1. Mild diffuse, moderate perisylvian and severe mesial temporal atrophy. 2. Mild periventricular  and subcortical chronic small vessel ischemic disease.  3. Right thalamic chronic lacunar ischemic infarction. 4. No acute findings. 5. Compared to CT from 05/21/07, the atrophy and chronic right thalamic infarct are new findings.    Plan:  Treatment plan and additional workup :  Alzheimer, slow progression-  slowed gait, decreased cognitive function. Non vascular.  Can't explain the brisk reflexes , which are upper motor neuron findings, or related to small vesel disease.  I will continue the medication as initiated.  Continue the nutritional supplement "sharp thought, country life".  RV in 6-8 months with Butler Denmark, NP.    Asencion Partridge Porcia Morganti MD 11/19/2021

## 2021-12-26 ENCOUNTER — Encounter: Payer: Self-pay | Admitting: Nurse Practitioner

## 2021-12-26 DIAGNOSIS — Z780 Asymptomatic menopausal state: Secondary | ICD-10-CM | POA: Insufficient documentation

## 2022-02-04 ENCOUNTER — Non-Acute Institutional Stay: Payer: Medicare Other | Admitting: Internal Medicine

## 2022-02-04 ENCOUNTER — Encounter: Payer: Self-pay | Admitting: Internal Medicine

## 2022-02-04 DIAGNOSIS — F339 Major depressive disorder, recurrent, unspecified: Secondary | ICD-10-CM | POA: Diagnosis not present

## 2022-02-04 DIAGNOSIS — F0280 Dementia in other diseases classified elsewhere without behavioral disturbance: Secondary | ICD-10-CM

## 2022-02-04 DIAGNOSIS — G301 Alzheimer's disease with late onset: Secondary | ICD-10-CM

## 2022-02-04 DIAGNOSIS — N1832 Chronic kidney disease, stage 3b: Secondary | ICD-10-CM | POA: Diagnosis not present

## 2022-02-04 DIAGNOSIS — E782 Mixed hyperlipidemia: Secondary | ICD-10-CM | POA: Diagnosis not present

## 2022-02-04 NOTE — Progress Notes (Signed)
Location:   [place] Room Number: [place] of Service:  ALF [id number]) Provider:  Veleta Miners MD  [name], MD  Patient Care Team: [name], MD as PCP - General (Internal Medicine) [name], MD as Referring Physician (Physical Medicine and Rehabilitation) Lajean Manes, MD as Consulting Physician (Internal Medicine)  Extended Emergency Contact Information Primary Emergency Contact: [name], [place] Phone: [phone] Relation: Daughter Secondary Emergency Contact: Myers,Lois  [place] Phone: [phone] Relation: Daughter  Code Status:  DNR Goals of care: Advanced Directive information Advanced Directives 02/04/2022  Does Patient Have a Medical Advance Directive? Yes  Type of Paramedic of [place];Living will;Out of facility DNR (pink MOST or yellow form)  Does patient want to make changes to medical advance directive? No - Patient declined  Copy of [place] in Chart? Yes - validated most recent copy scanned in chart (See row information)  Pre-existing out of facility DNR order (yellow form or pink MOST form) Pink MOST form placed in chart (order not valid for inpatient use)     Chief Complaint  Patient presents with   Medical Management of Chronic Issues   Quality Metric Gaps    Zoster Vaccines- Shingrix (1 of 2) Last completed: Dec 15, 2005     Jun 22 1999 Pneumonia Vaccine 67+ Years old (1 - PCV) Done DEXA SCAN (Once) 2021 TETANUS/TDAP      HPI:  Pt is a 88 y.o. female seen today for medical management of chronic diseases.    Has h/o hyperlipidemia, depression, Alzheimer's dementia and unsteady gait  Patient continues to stay stable  Lives in AL No new Nursing issues. 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   Dyslipidemia    Fibromyalgia    Granuloma annulare    History of uterine cancer 2003   treated with hysterectomy   Lumbar radicular syndrome    left L5   Macular degeneration    left eye   Sciatica    TIA (transient ischemic attack)    Past Surgical History:  Procedure Laterality Date   ABDOMINAL HYSTERECTOMY     BREAST BIOPSY  2007   CATARACT EXTRACTION     TONSILLECTOMY      Allergies  Allergen Reactions   Aricept [Donepezil Hcl]     Muscle cramps   Latex Itching   Tramadol Other (See Comments)    Pt can't remember what side effects she had, she doesn't take it now    Allergies as of 02/04/2022       Reactions   Aricept [donepezil Hcl]    Muscle cramps   Latex Itching   Tramadol Other (See Comments)   Pt can't remember what side effects she had, she doesn't take it now        Medication List  Accurate as of February 04, 2022  2:21 PM. If you have any questions, ask your nurse or doctor.          aspirin EC 81 MG tablet Take 81 mg by mouth daily.   CALCIUM-VITAMIN D PO Take 1 tablet by mouth daily. 600mg  calcium, 800 units Vit D   melatonin 3 MG Tabs tablet Take 3 mg by mouth at bedtime.   memantine 10 MG tablet Commonly known as: NAMENDA TAKE 1 TABLET BY MOUTH TWICE DAILY.   mirtazapine 30 MG tablet Commonly known as: REMERON TAKE ONE TABLET AT BEDTIME.   CENTRUM SILVER PO Take 2 tablets by mouth daily.   PRESERVISION AREDS PO Take 2 capsules by mouth daily.   rivastigmine 1.5 MG capsule Commonly known as: EXELON Take 1 capsule (1.5 mg total) by mouth 2 (two) times daily.   sertraline 50 MG  tablet Commonly known as: ZOLOFT Take 1 tablet (50 mg total) by mouth daily.   UNABLE TO FIND Med Name: Hervey Ard Thought 1 capsule twice daily   vitamin B-12 500 MCG tablet Commonly known as: CYANOCOBALAMIN Take 500 mcg by mouth daily.        Review of Systems  Constitutional:  Negative for activity change and appetite change.  HENT: Negative.    Respiratory:  Negative for cough and shortness of breath.   Cardiovascular:  Negative for leg swelling.  Gastrointestinal:  Negative for constipation.  Genitourinary: Negative.   Musculoskeletal:  Negative for arthralgias, gait problem and myalgias.  Skin: Negative.   Neurological:  Negative for dizziness and weakness.  Psychiatric/Behavioral:  Negative for confusion, dysphoric mood and sleep disturbance.    Immunization History  Administered Date(s) Administered   Influenza, High Dose Seasonal PF 09/23/2017, 09/28/2019   Influenza,inj,Quad PF,6+ Mos 09/16/2018   Influenza-Unspecified 09/14/2017, 09/26/2020, 10/03/2021   Moderna SARS-COV2 Booster Vaccination 10/23/2020, 05/14/2021   Moderna Sars-Covid-2 Vaccination 12/19/2019, 01/16/2020, 05/14/2021   Pneumococcal-Unspecified 12/15/2001   Zoster, Unspecified 12/15/2005   Pertinent  Health Maintenance Due  Topic Date Due   DEXA SCAN  Never done   INFLUENZA VACCINE  Completed   Fall Risk 05/01/2017 09/21/2018 12/29/2018 02/08/2019 03/07/2020  Falls in the past year? Yes No 0 0 0  Was there an injury with Fall? No - 0 0 -  Fall Risk Category Calculator - - 0 0 -  Fall Risk Category - - Low Low -  Patient Fall Risk Level - - Low fall risk Low fall risk -  Patient at Risk for Falls Due to Impaired balance/gait - - - -   Functional Status Survey:    Vitals:   02/04/22 1412  BP: (!) 164/82  Pulse: 91  Resp: 20  Temp: 97.6 F (36.4 C)  SpO2: 95%  Weight: 141 lb 9.6 oz (64.2 kg)  Height: 5\' 1"  (1.549 m)   Body mass index is 26.76 kg/m. Physical Exam Vitals reviewed.   Constitutional:      Appearance: Normal appearance.  HENT:     Head: Normocephalic.     Nose: Nose normal.     Mouth/Throat:     Mouth: Mucous membranes are moist.     Pharynx: Oropharynx is clear.  Eyes:     Pupils: Pupils are equal, round, and reactive to light.  Cardiovascular:     Rate and Rhythm: Normal rate and regular rhythm.     Pulses: Normal pulses.     Heart sounds: Normal heart sounds. No murmur heard. Pulmonary:     Effort:  Pulmonary effort is normal.     Breath sounds: Normal breath sounds.  Abdominal:     General: Abdomen is flat. Bowel sounds are normal.     Palpations: Abdomen is soft.  Musculoskeletal:     Cervical back: Neck supple.     Comments: Mild swelling in her Left Lower leg  Skin:    General: Skin is warm.  Neurological:     General: No focal deficit present.     Mental Status: She is alert and oriented to person, place, and time.  Psychiatric:        Mood and Affect: Mood normal.        Thought Content: Thought content normal.    Labs reviewed: Recent Labs    03/29/21 0000  NA 138  K 4.3  CL 101  CO2 29*  BUN 20  CREATININE 1.2*  CALCIUM 9.5   Recent Labs    03/29/21 0000  AST 20  ALT 16  ALKPHOS 69  ALBUMIN 4.4   Recent Labs    03/29/21 0000  WBC 7.0  NEUTROABS 3,703.00  HGB 12.9  HCT 39  PLT 267   Lab Results  Component Value Date   TSH 4.01 09/10/2020   No results found for: HGBA1C Lab Results  Component Value Date   CHOL 217 (A) 03/29/2021   HDL 48 03/29/2021   LDLCALC 152 03/29/2021   TRIG 70 03/29/2021   CHOLHDL 4.7 08/01/2019    Significant Diagnostic Results in last 30 days:  No results found.  Assessment/Plan 1. Late onset Alzheimer's disease without behavioral disturbance ([place]) On Exelon And Namenda Last MMSE was 19/30 in 12/22 Continue s to do well in AL setting Was seen by Neurology  2. Stage 3b chronic kidney disease ([facility]) Repeat BMP  Creat has been stable  3. Depression, recurrent  ([place]) On Low dose of Zoloft and Remeron  4. Mixed hyperlipidemia Repeat Lipid panel 5 Elevated BP Check BP QD for 2 weeks   Family/ staff Communication:   Labs/tests ordered:  CBC,CMP,TSH,Lipid panel

## 2022-03-28 ENCOUNTER — Non-Acute Institutional Stay: Payer: Medicare Other | Admitting: Nurse Practitioner

## 2022-03-28 ENCOUNTER — Encounter: Payer: Self-pay | Admitting: Nurse Practitioner

## 2022-03-28 DIAGNOSIS — F0390 Unspecified dementia without behavioral disturbance: Secondary | ICD-10-CM

## 2022-03-28 DIAGNOSIS — F339 Major depressive disorder, recurrent, unspecified: Secondary | ICD-10-CM

## 2022-03-28 DIAGNOSIS — R635 Abnormal weight gain: Secondary | ICD-10-CM

## 2022-03-28 DIAGNOSIS — R0600 Dyspnea, unspecified: Secondary | ICD-10-CM | POA: Diagnosis not present

## 2022-03-28 DIAGNOSIS — E782 Mixed hyperlipidemia: Secondary | ICD-10-CM

## 2022-03-28 DIAGNOSIS — E538 Deficiency of other specified B group vitamins: Secondary | ICD-10-CM

## 2022-03-28 DIAGNOSIS — N1832 Chronic kidney disease, stage 3b: Secondary | ICD-10-CM

## 2022-03-28 DIAGNOSIS — R600 Localized edema: Secondary | ICD-10-CM | POA: Diagnosis not present

## 2022-03-28 NOTE — Assessment & Plan Note (Signed)
not taking statin, LDL 64 02/11/22, takes ASA 81mg qd, refused statin. 

## 2022-03-28 NOTE — Assessment & Plan Note (Signed)
no significant change from 04/03/21, update BNP, monitor weight.  ?

## 2022-03-28 NOTE — Assessment & Plan Note (Addendum)
Bun/creat 23/1.09 02/11/22, update CMP/eGFR ?

## 2022-03-28 NOTE — Progress Notes (Deleted)
?Location:   [facility] ?Nursing Home Room Number: 802A ?Place of Service:  ALF (13) ?Provider:  Mast, Man X, NP ? ?Janet Dad, MD ? ?Patient Care Team: ?Janet Dad, MD as PCP - General (Internal Medicine) ?[name], MD as Referring Physician (Physical Medicine and Rehabilitation) ?Lajean Manes, MD as Consulting Physician (Internal Medicine) ? ?Extended Emergency Contact Information ?Primary Emergency Contact: Wenhart,Beth ?         Dana, [place] ?Home Phone: [phone] ?Relation: Daughter ?Secondary Emergency Contact: Myers,Lois ? [place] ?Home Phone: [phone] ?Relation: Daughter ? ?Code Status:  DNR ?Goals of care: Advanced Directive information ? ?  03/28/2022  ? 11:50 AM  ?Advanced Directives  ?Does Patient Have a Medical Advance Directive? Yes  ?Type of Paramedic of [place];Living will;Out of facility DNR (pink MOST or yellow form)  ?Does patient want to make changes to medical advance directive? No - Patient declined  ?Copy of [place] in Chart? Yes - validated most recent copy scanned in chart (See row information)  ?Pre-existing out of facility DNR order (yellow form or pink MOST form) Pink MOST form placed in chart (order not valid for inpatient use)  ? ? ? ?Chief Complaint  ?Patient presents with  ? Acute Visit  ?  Weight gain  ? ? ?HPI:  ?Pt is a 88 y.o. female seen today for medical management of chronic diseases.   ? ? ?Past Medical History:  ?Diagnosis Date  ? BPV (benign positional vertigo)   ? Cancer [facility])   ? uterine cancer  ? Carpal tunnel syndrome on left 06/02/2017  ? Cognitive changes   ? Depression   ? Droopy eyelid, right   ? 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 Dyslipidemia   ? Fibromyalgia   ? Granuloma annulare   ? History of uterine cancer 2003  ? treated with hysterectomy  ? Lumbar radicular syndrome   ? left L5  ? Macular degeneration   ? left eye  ? Sciatica   ? TIA  (transient ischemic attack)   ? ?Past Surgical History:  ?Procedure Laterality Date  ? ABDOMINAL HYSTERECTOMY    ? BREAST BIOPSY  2007  ? CATARACT EXTRACTION    ? TONSILLECTOMY    ? ? ?Allergies  ?Allergen Reactions  ? Aricept [Donepezil Hcl]   ?  Muscle cramps  ? Latex Itching  ? Tramadol Other (See Comments)  ?  Pt can't remember what side effects she had, she doesn't take it now  ? ? ?Allergies as of 03/28/2022   ? ?   Reactions  ? Aricept [donepezil Hcl]   ? Muscle cramps  ? Latex Itching  ? Tramadol Other (See Comments)  ? Pt can't remember what side effects she had, she doesn't take it now  ? ?  ? ?  ?Medication List  ?  ? ?  ? Accurate as of March 28, 2022 11:51 AM. If you have any questions, ask your nurse or doctor.  ?  ?  ? ?  ? ?aspirin EC 81 MG tablet ?Take 81 mg by mouth daily. ?  ?CALCIUM-VITAMIN D PO ?Take 1 tablet by mouth daily. '600mg'$  calcium, 800 units Vit D ?  ?melatonin 3 MG Tabs tablet ?Take 3 mg by mouth at bedtime. ?  ?memantine 10 MG tablet ?Commonly known as: NAMENDA ?TAKE 1 TABLET BY MOUTH TWICE DAILY. ?  ?mirtazapine 30 MG tablet ?Commonly known as: REMERON ?TAKE  ONE TABLET AT BEDTIME. ?  ?CENTRUM SILVER PO ?Take 2 tablets by mouth daily. ?  ?PRESERVISION AREDS PO ?Take 1 capsule by mouth 2 (two) times daily. ?  ?rivastigmine 1.5 MG capsule ?Commonly known as: EXELON ?Take 1 capsule (1.5 mg total) by mouth 2 (two) times daily. ?  ?sertraline 50 MG tablet ?Commonly known as: ZOLOFT ?Take 1 tablet (50 mg total) by mouth daily. ?  ?UNABLE TO FIND ?Med Name: Hervey Ard Thought 1 capsule twice daily ?  ?vitamin B-12 500 MCG tablet ?Commonly known as: CYANOCOBALAMIN ?Take 500 mcg by mouth daily. ?  ? ?  ? ? ?Review of Systems ? ?Immunization History  ?Administered Date(s) Administered  ? Influenza, High Dose Seasonal PF 09/23/2017, 09/28/2019  ? Influenza,inj,Quad PF,6+ Mos 09/16/2018  ? Influenza-Unspecified 09/14/2017, 09/26/2020, 10/03/2021  ? Moderna SARS-COV2 Booster Vaccination 10/23/2020,  05/14/2021  ? Moderna Sars-Covid-2 Vaccination 12/19/2019, 01/16/2020, 05/14/2021  ? Pneumococcal Conjugate-13 02/21/2014  ? Pneumococcal Polysaccharide-23 02/21/2002  ? Pneumococcal-Unspecified 12/15/2001  ? Zoster, Unspecified 12/15/2005  ? ?Pertinent  Health Maintenance Due  ?Topic Date Due  ? DEXA SCAN  Never done  ? INFLUENZA VACCINE  07/15/2022  ? ? ?  05/01/2017  ? 11:19 AM 09/21/2018  ?  4:01 PM 12/29/2018  ?  4:11 PM 02/08/2019  ?  1:54 PM 03/07/2020  ? 12:54 PM  ?Fall Risk  ?Falls in the past year? Yes No 0 0 0  ?Was there an injury with Fall? No  0 0   ?Fall Risk Category Calculator   0 0   ?Fall Risk Category   Low Low   ?Patient Fall Risk Level   Low fall risk Low fall risk   ?Patient at Risk for Falls Due to Impaired balance/gait      ? ?Functional Status Survey: ?  ? ?There were no vitals filed for this visit. ?There is no height or weight on file to calculate BMI. ?Physical Exam ? ?Labs reviewed: ?Recent Labs  ?  03/29/21 ?0000  ?NA 138  ?K 4.3  ?CL 101  ?CO2 29*  ?BUN 20  ?CREATININE 1.2*  ?CALCIUM 9.5  ? ?Recent Labs  ?  03/29/21 ?0000  ?AST 20  ?ALT 16  ?ALKPHOS 69  ?ALBUMIN 4.4  ? ?Recent Labs  ?  03/29/21 ?0000  ?WBC 7.0  ?NEUTROABS 3,703.00  ?HGB 12.9  ?HCT 39  ?PLT 267  ? ?Lab Results  ?Component Value Date  ? TSH 4.01 09/10/2020  ? ?No results found for: HGBA1C ?Lab Results  ?Component Value Date  ? CHOL 217 (A) 03/29/2021  ? HDL 48 03/29/2021  ? [place] 152 03/29/2021  ? TRIG 70 03/29/2021  ? CHOLHDL 4.7 08/01/2019  ? ? ?Significant Diagnostic Results in last 30 days:  ?No results found. ? ?Assessment/Plan ?There are no diagnoses linked to this encounter. ? ? ?Family/ staff Communication:  ? ?Labs/tests ordered:   ? ?  ?

## 2022-03-28 NOTE — Progress Notes (Signed)
?Location:   [facility] ?Nursing Home Room Number: 802A ?Place of Service:  ALF (13) ?Provider:  Peytin Dechert X, NP ? ?[name], MD ? ?Patient Care Team: ?[name], MD as PCP - General (Internal Medicine) ?[name], MD as Referring Physician (Physical Medicine and Rehabilitation) ?Lajean Manes, MD as Consulting Physician (Internal Medicine) ? ?Extended Emergency Contact Information ?Primary Emergency Contact: Wenhart,Beth ?         [place], [place] ?Home Phone: [phone] ?Relation: Daughter ?Secondary Emergency Contact: Myers,Lois ? [place] ?Home Phone: [phone] ?Relation: Daughter ? ?Code Status:  DNR ?Goals of care: Advanced Directive information ? ?  03/28/2022  ? 11:50 AM  ?Advanced Directives  ?Does Patient Have a Medical Advance Directive? Yes  ?Type of Paramedic of [place];Living will;Out of facility DNR (pink MOST or yellow form)  ?Does patient want to make changes to medical advance directive? No - Patient declined  ?Copy of [place] in Chart? Yes - validated most recent copy scanned in chart (See row information)  ?Pre-existing out of facility DNR order (yellow form or pink MOST form) Pink MOST form placed in chart (order not valid for inpatient use)  ? ? ? ?Chief Complaint  ?Patient presents with  ? Acute Visit  ?  Weight gain  ? ? ?HPI:  ?Pt is a 88 y.o. female seen today for an acute visit for weight gained about #10Ibs in the past 3 months, noted mild swelling BLE,  the patient denied SOB which reported by the patient's daughter Ronnie Derby, HPOA daughter wants to r/o CHF, kidney issue, fluid retention.  ?  ? Hx of Hyperlipidemia, not taking statin, LDL 64 02/11/22, takes ASA '81mg'$  qd, refused statin.  ?            CKD Bun/creat 23/1.09 02/11/22 ?            Depression, takes Mirtazapine, Sertraline, TSH 4.9 02/11/22 ?            Dementia, MMSE 19/30 12/22,  takes Exelon, Memantine,  resides [facility] for safety, care assistance. underwent Neurology consultation.  ?            Vit B12 deficiency, takes Vit B12, Vit B12 1010 03/02/20, Hgb 11.8 02/11/22 ? Dyspnea, Hx of 01/08/2017 ? Peripheral edema, no significant change from 04/03/21 ?  ?  ? ?Past Medical History:  ?Diagnosis Date  ? BPV (benign positional vertigo)   ? Cancer [facility])   ? uterine cancer  ? Carpal tunnel syndrome on left 06/02/2017  ? Cognitive changes   ? Depression   ? Droopy eyelid, right   ? 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 Dyslipidemia   ? Fibromyalgia   ? Granuloma annulare   ? History of uterine cancer 2003  ? treated with hysterectomy  ? Lumbar radicular syndrome   ? left L5  ? Macular degeneration   ? left eye  ? Sciatica   ? TIA (transient ischemic attack)   ? ?Past Surgical History:  ?Procedure Laterality Date  ? ABDOMINAL HYSTERECTOMY    ? BREAST BIOPSY  2007  ? CATARACT EXTRACTION    ? TONSILLECTOMY    ? ? ?Allergies  ?Allergen Reactions  ? Aricept [Donepezil Hcl]   ?  Muscle cramps  ? Latex Itching  ? Tramadol Other (See Comments)  ?  Pt can't remember what side effects she had, she doesn't take it now  ? ? ?  Allergies as of 03/28/2022   ? ?   Reactions  ? Aricept [donepezil Hcl]   ? Muscle cramps  ? Latex Itching  ? Tramadol Other (See Comments)  ? Pt can't remember what side effects she had, she doesn't take it now  ? ?  ? ?  ?Medication List  ?  ? ?  ? Accurate as of March 28, 2022 11:59 PM. If you have any questions, ask your nurse or doctor.  ?  ?  ? ?  ? ?aspirin EC 81 MG tablet ?Take 81 mg by mouth daily. ?  ?CALCIUM-VITAMIN D PO ?Take 1 tablet by mouth daily. '600mg'$  calcium, 800 units Vit D ?  ?melatonin 3 MG Tabs tablet ?Take 3 mg by mouth at bedtime. ?  ?memantine 10 MG tablet ?Commonly known as: NAMENDA ?TAKE 1 TABLET BY MOUTH TWICE DAILY. ?  ?mirtazapine 30 MG tablet ?Commonly known as: REMERON ?TAKE ONE TABLET AT BEDTIME. ?  ?CENTRUM SILVER PO ?Take 2 tablets by mouth daily. ?  ?PRESERVISION AREDS PO ?Take 1  capsule by mouth 2 (two) times daily. ?  ?rivastigmine 1.5 MG capsule ?Commonly known as: EXELON ?Take 1 capsule (1.5 mg total) by mouth 2 (two) times daily. ?  ?sertraline 50 MG tablet ?Commonly known as: ZOLOFT ?Take 1 tablet (50 mg total) by mouth daily. ?  ?UNABLE TO FIND ?Med Name: Hervey Ard Thought 1 capsule twice daily ?  ?vitamin B-12 500 MCG tablet ?Commonly known as: CYANOCOBALAMIN ?Take 500 mcg by mouth daily. ?  ? ?  ? ? ?Review of Systems  ?Constitutional:  Positive for unexpected weight change. Negative for fatigue and fever.  ?     Weight gained about #10Ibs in the past 3 months.   ?HENT:  Positive for hearing loss. Negative for congestion, trouble swallowing and voice change.   ?Eyes:  Negative for visual disturbance.  ?Respiratory:  Negative for cough and shortness of breath.   ?     ?DOE  ?Cardiovascular:  Positive for leg swelling.  ?Gastrointestinal:  Negative for abdominal pain and constipation.  ?Genitourinary:  Negative for dysuria, frequency and urgency.  ?Musculoskeletal:  Positive for gait problem.  ?Skin:  Negative for color change.  ?Neurological:  Negative for speech difficulty and headaches.  ?     Dementia  ?Psychiatric/Behavioral:  Negative for behavioral problems and sleep disturbance. The patient is not nervous/anxious.   ? ?Immunization History  ?Administered Date(s) Administered  ? Influenza, High Dose Seasonal PF 09/23/2017, 09/28/2019  ? Influenza,inj,Quad PF,6+ Mos 09/16/2018  ? Influenza-Unspecified 09/14/2017, 09/26/2020, 10/03/2021  ? Moderna SARS-COV2 Booster Vaccination 10/23/2020, 05/14/2021  ? Moderna Sars-Covid-2 Vaccination 12/19/2019, 01/16/2020, 05/14/2021  ? Pneumococcal Conjugate-13 02/21/2014  ? Pneumococcal Polysaccharide-23 02/21/2002  ? Pneumococcal-Unspecified 12/15/2001  ? Zoster, Unspecified 12/15/2005  ? ?Pertinent  Health Maintenance Due  ?Topic Date Due  ? DEXA SCAN  Never done  ? INFLUENZA VACCINE  07/15/2022  ? ? ?  05/01/2017  ? 11:19 AM 09/21/2018  ?  4:01  PM 12/29/2018  ?  4:11 PM 02/08/2019  ?  1:54 PM 03/07/2020  ? 12:54 PM  ?Fall Risk  ?Falls in the past year? Yes No 0 0 0  ?Was there an injury with Fall? No  0 0   ?Fall Risk Category Calculator   0 0   ?Fall Risk Category   Low Low   ?Patient Fall Risk Level   Low fall risk Low fall risk   ?Patient at Risk for Falls Due to  Impaired balance/gait      ? ?Functional Status Survey: ?  ? ?Vitals:  ? 03/28/22 1055  ?BP: 128/68  ?Pulse: 90  ?Resp: 18  ?Temp: (!) 97.5 ?F (36.4 ?C)  ?SpO2: 98%  ? ?There is no height or weight on file to calculate BMI. ?Physical Exam ?Vitals and nursing note reviewed.  ?Constitutional:   ?   Appearance: Normal appearance.  ?HENT:  ?   Head: Normocephalic and atraumatic.  ?   Mouth/Throat:  ?   Mouth: Mucous membranes are moist.  ?Eyes:  ?   Extraocular Movements: Extraocular movements intact.  ?   Conjunctiva/sclera: Conjunctivae normal.  ?   Pupils: Pupils are equal, round, and reactive to light.  ?Cardiovascular:  ?   Rate and Rhythm: Normal rate and regular rhythm.  ?   Heart sounds: No murmur heard. ?Pulmonary:  ?   Breath sounds: No rales.  ?Abdominal:  ?   General: Bowel sounds are normal.  ?   Palpations: Abdomen is soft.  ?   Tenderness: There is no abdominal tenderness.  ?Musculoskeletal:  ?   Cervical back: Normal range of motion and neck supple.  ?   Right lower leg: Edema present.  ?   Left lower leg: Edema present.  ?   Comments: Trace edema BLE  ?Skin: ?   General: Skin is warm and dry.  ?Neurological:  ?   General: No focal deficit present.  ?   Mental Status: She is alert. Mental status is at baseline.  ?   Motor: No weakness.  ?   Coordination: Coordination normal.  ?   Gait: Gait abnormal.  ?   Comments: Oriented to person, place.   ?Psychiatric:     ?   Mood and Affect: Mood normal.     ?   Behavior: Behavior normal.  ? ? ?Labs reviewed: ?No results for input(s): NA, K, CL, CO2, GLUCOSE, BUN, CREATININE, CALCIUM, MG, PHOS in the last 8760 hours. ? ?No results for input(s):  AST, ALT, ALKPHOS, BILITOT, PROT, ALBUMIN in the last 8760 hours. ? ?No results for input(s): WBC, NEUTROABS, HGB, HCT, MCV, PLT in the last 8760 hours. ? ?Lab Results  ?Component Value Date  ? TSH 4.01 09/27/

## 2022-03-28 NOTE — Assessment & Plan Note (Signed)
MMSE 19/30 12/22,  takes Exelon, Memantine, resides Al [facility] for safety, care assistance. underwent Neurology consultation. 

## 2022-03-28 NOTE — Assessment & Plan Note (Addendum)
takes Vit B12, Vit B12 1010 03/02/20, Hgb 11.8 02/11/22, update CBC/diff ?

## 2022-03-28 NOTE — Assessment & Plan Note (Addendum)
Hx of 01/08/2017, will obtain CXR, BNP to evaluate further.  ?

## 2022-03-28 NOTE — Assessment & Plan Note (Signed)
Her mood is stable, takes Mirtazapine, Sertraline, TSH 4.9 02/11/22 ?

## 2022-03-28 NOTE — Assessment & Plan Note (Signed)
Weight gained about #10Ibs in the past 3 months, noted mild swelling BLE,  the patient denied SOB which reported by the patient's daughter Ronnie Derby, HPOA daughter wants to r/o CHF, kidney issue, fluid retention.  ?Will update CBC/diff, BNP, CMP/eGFR, CXR TSH ?

## 2022-03-31 ENCOUNTER — Encounter: Payer: Self-pay | Admitting: Nurse Practitioner

## 2022-04-02 LAB — BASIC METABOLIC PANEL
BUN: 17 (ref 4–21)
CO2: 23 — AB (ref 13–22)
Chloride: 104 (ref 99–108)
Creatinine: 1.2 — AB (ref 0.5–1.1)
Glucose: 99
Potassium: 4.4 mEq/L (ref 3.5–5.1)
Sodium: 138 (ref 137–147)

## 2022-04-02 LAB — CBC: RBC: 3.98 (ref 3.87–5.11)

## 2022-04-02 LAB — HEPATIC FUNCTION PANEL
ALT: 15 U/L (ref 7–35)
AST: 18 (ref 13–35)
Bilirubin, Total: 0.4

## 2022-04-02 LAB — CBC AND DIFFERENTIAL
HCT: 37 (ref 36–46)
Hemoglobin: 12.7 (ref 12.0–16.0)
Neutrophils Absolute: 3829
Platelets: 270 10*3/uL (ref 150–400)
WBC: 6.5

## 2022-04-02 LAB — TSH: TSH: 4.58 (ref 0.41–5.90)

## 2022-04-02 LAB — COMPREHENSIVE METABOLIC PANEL
Albumin: 4.1 (ref 3.5–5.0)
Calcium: 9.2 (ref 8.7–10.7)
Globulin: 2.5
eGFR: 44

## 2022-04-07 ENCOUNTER — Encounter: Payer: Self-pay | Admitting: Nurse Practitioner

## 2022-04-07 ENCOUNTER — Non-Acute Institutional Stay: Payer: Medicare Other | Admitting: Nurse Practitioner

## 2022-04-07 DIAGNOSIS — G301 Alzheimer's disease with late onset: Secondary | ICD-10-CM

## 2022-04-07 DIAGNOSIS — R0600 Dyspnea, unspecified: Secondary | ICD-10-CM

## 2022-04-07 DIAGNOSIS — E538 Deficiency of other specified B group vitamins: Secondary | ICD-10-CM

## 2022-04-07 DIAGNOSIS — R600 Localized edema: Secondary | ICD-10-CM

## 2022-04-07 DIAGNOSIS — I5189 Other ill-defined heart diseases: Secondary | ICD-10-CM

## 2022-04-07 DIAGNOSIS — R635 Abnormal weight gain: Secondary | ICD-10-CM

## 2022-04-07 DIAGNOSIS — F02B3 Dementia in other diseases classified elsewhere, moderate, with mood disturbance: Secondary | ICD-10-CM

## 2022-04-07 DIAGNOSIS — E782 Mixed hyperlipidemia: Secondary | ICD-10-CM | POA: Diagnosis not present

## 2022-04-07 DIAGNOSIS — N1832 Chronic kidney disease, stage 3b: Secondary | ICD-10-CM

## 2022-04-07 DIAGNOSIS — F339 Major depressive disorder, recurrent, unspecified: Secondary | ICD-10-CM

## 2022-04-07 NOTE — Progress Notes (Signed)
?Location:  [facility] ?Nursing Home Room Number: NO/802/[id number] ?Place of Service:  ALF (13) ?Provider:  Davelyn Gwinn X, NP ? ? ?[name], MD ? ?Patient Care Team: ?[name], MD as PCP - General (Internal Medicine) ?[name], MD as Referring Physician (Physical Medicine and Rehabilitation) ?Lajean Manes, MD as Consulting Physician (Internal Medicine) ? ?Extended Emergency Contact Information ?Primary Emergency Contact: Wenhart,Beth ?         [place], [place] ?Home Phone: [phone] ?Relation: Daughter ?Secondary Emergency Contact: Myers,Lois ? [place] ?Home Phone: [phone] ?Relation: Daughter ? ?Code Status:  DNR ?Goals of care: Advanced Directive information ? ?  04/07/2022  ?  4:16 PM  ?Advanced Directives  ?Does Patient Have a Medical Advance Directive? Yes  ?Type of Advance Directive Out of facility DNR (pink MOST or yellow form);Healthcare Power of Attorney  ?Does patient want to make changes to medical advance directive? No - Patient declined  ?Copy of [place] in Chart? Yes - validated most recent copy scanned in chart (See row information)  ?Pre-existing out of facility DNR order (yellow form or pink MOST form) Pink MOST form placed in chart (order not valid for inpatient use)  ? ? ? ?Chief Complaint  ?Patient presents with  ? Medical Management of Chronic Issues  ?  Patient is here for a follow up for chronic conditions ?  ? ? ?HPI:  ?Pt is a 88 y.o. female seen today for managing her chronic medical conditions.  ?  ?  Weight gained about #10Ibs in the past 3 months, weight down #2Ibs in the past 3 weeks. TSH 4.58, wbc wnl, Bun/creat 17/1.19 at her baseline, BNP 31 04/01/22, CXR 03/28/22 no acute pulmonary disease ? ?Mild swelling BLE,  SOB reported by the patient's daughter Ronnie Derby, [place] daughter wants to r/o CHF, kidney issue, fluid retention. Bun/creat 17/1.19 at her baseline, BNP 31 04/01/22, CXR 03/28/22 no acute  pulmonary disease ?            Hx of grade I diastolic dysfunction 4235 per echocardiogram due to SOB ?            Hx of Hyperlipidemia, not taking statin, LDL 64 02/11/22, takes ASA '81mg'$  qd, refused statin.  ?            CKD Bun/creat 17/1.19 04/01/22 ?            Depression, takes Mirtazapine, Sertraline, TSH 4.58 04/01/22 ?            Dementia, MMSE 19/30 12/22,  takes Exelon, Memantine, resides [facility] for safety, care assistance. underwent Neurology consultation.  ?            Vit B12 deficiency, takes Vit B12, Vit B12 1010 03/02/20, Hgb 12.7 04/01/22 ?            Dyspnea, Hx of 01/08/2017 ?            Peripheral edema, no significant change  ? ?Past Medical History:  ?Diagnosis Date  ? BPV (benign positional vertigo)   ? Cancer [facility])   ? uterine cancer  ? Carpal tunnel syndrome on left 06/02/2017  ? Cognitive changes   ? Depression   ? Droopy eyelid, right   ? 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 Dyslipidemia   ? Fibromyalgia   ? Granuloma annulare   ? History of uterine cancer 2003  ? treated with hysterectomy  ? Lumbar radicular syndrome   ?  left L5  ? Macular degeneration   ? left eye  ? Sciatica   ? TIA (transient ischemic attack)   ? ?Past Surgical History:  ?Procedure Laterality Date  ? ABDOMINAL HYSTERECTOMY    ? BREAST BIOPSY  2007  ? CATARACT EXTRACTION    ? TONSILLECTOMY    ? ? ?Allergies  ?Allergen Reactions  ? Aricept [Donepezil Hcl]   ?  Muscle cramps  ? Latex Itching  ? Tramadol Other (See Comments)  ?  Pt can't remember what side effects she had, she doesn't take it now  ? ? ?Outpatient Encounter Medications as of 04/07/2022  ?Medication Sig  ? aspirin EC 81 MG tablet Take 81 mg by mouth daily.  ? CALCIUM-VITAMIN D PO Take 1 tablet by mouth daily. '600mg'$  calcium, 800 units Vit D  ? melatonin 3 MG TABS tablet Take 3 mg by mouth at bedtime.  ? memantine (NAMENDA) 10 MG tablet TAKE 1 TABLET BY MOUTH TWICE DAILY.  ? mirtazapine (REMERON) 30 MG tablet TAKE ONE TABLET AT BEDTIME.  ? Multiple Vitamins-Minerals  (CENTRUM SILVER PO) Take 2 tablets by mouth daily.   ? Multiple Vitamins-Minerals (PRESERVISION AREDS PO) Take 1 capsule by mouth 2 (two) times daily.  ? rivastigmine (EXELON) 1.5 MG capsule Take 1 capsule (1.5 mg total) by mouth 2 (two) times daily.  ? sertraline (ZOLOFT) 50 MG tablet Take 1 tablet (50 mg total) by mouth daily.  ? UNABLE TO FIND Med Name: Hervey Ard Thought 1 capsule twice daily  ? vitamin B-12 (CYANOCOBALAMIN) 500 MCG tablet Take 500 mcg by mouth daily.  ? ?No facility-administered encounter medications on file as of 04/07/2022.  ? ?ROS was provided with assistance of the patient's daughter and staff ?Review of Systems  ?Constitutional:  Positive for unexpected weight change. Negative for fatigue and fever.  ?     Weight gained about #10Ibs in the past 3 months.   ?HENT:  Positive for hearing loss. Negative for congestion, trouble swallowing and voice change.   ?Eyes:  Negative for visual disturbance.  ?Respiratory:  Negative for cough and shortness of breath.   ?     ?DOE  ?Cardiovascular:  Positive for leg swelling.  ?Gastrointestinal:  Negative for abdominal pain and constipation.  ?Genitourinary:  Negative for dysuria, frequency and urgency.  ?Musculoskeletal:  Positive for gait problem.  ?Skin:  Negative for color change.  ?Neurological:  Negative for speech difficulty and headaches.  ?     Dementia  ?Psychiatric/Behavioral:  Negative for behavioral problems and sleep disturbance. The patient is not nervous/anxious.   ? ?Immunization History  ?Administered Date(s) Administered  ? Influenza, High Dose Seasonal PF 09/23/2017, 09/28/2019  ? Influenza,inj,Quad PF,6+ Mos 09/16/2018  ? Influenza-Unspecified 09/14/2017, 09/26/2020, 10/03/2021  ? Moderna SARS-COV2 Booster Vaccination 10/23/2020, 05/14/2021  ? Moderna Sars-Covid-2 Vaccination 12/19/2019, 01/16/2020, 05/14/2021  ? Pneumococcal Conjugate-13 02/21/2014  ? Pneumococcal Polysaccharide-23 02/21/2002  ? Pneumococcal-Unspecified 12/15/2001  ?  Zoster, Unspecified 12/15/2005  ? ?Pertinent  Health Maintenance Due  ?Topic Date Due  ? DEXA SCAN  Never done  ? INFLUENZA VACCINE  07/15/2022  ? ? ?  05/01/2017  ? 11:19 AM 09/21/2018  ?  4:01 PM 12/29/2018  ?  4:11 PM 02/08/2019  ?  1:54 PM 03/07/2020  ? 12:54 PM  ?Fall Risk  ?Falls in the past year? Yes No 0 0 0  ?Was there an injury with Fall? No  0 0   ?Fall Risk Category Calculator   0 0   ?  Fall Risk Category   Low Low   ?Patient Fall Risk Level   Low fall risk Low fall risk   ?Patient at Risk for Falls Due to Impaired balance/gait      ? ?Functional Status Survey: ?  ? ?Vitals:  ? 04/07/22 1615  ?BP: (!) 162/80  ?Pulse: 91  ?Resp: 20  ?Temp: 97.7 ?F (36.5 ?C)  ?SpO2: 96%  ?Weight: 148 lb 3.2 oz (67.2 kg)  ?Height: '5\' 1"'$  (1.549 m)  ? ?Body mass index is 28 kg/m?Marland Kitchen ?Physical Exam ?Vitals and nursing note reviewed.  ?Constitutional:   ?   Appearance: Normal appearance.  ?HENT:  ?   Head: Normocephalic and atraumatic.  ?   Mouth/Throat:  ?   Mouth: Mucous membranes are moist.  ?Eyes:  ?   Extraocular Movements: Extraocular movements intact.  ?   Conjunctiva/sclera: Conjunctivae normal.  ?   Pupils: Pupils are equal, round, and reactive to light.  ?Cardiovascular:  ?   Rate and Rhythm: Normal rate and regular rhythm.  ?   Heart sounds: No murmur heard. ?Pulmonary:  ?   Breath sounds: No rales.  ?Abdominal:  ?   General: Bowel sounds are normal.  ?   Palpations: Abdomen is soft.  ?   Tenderness: There is no abdominal tenderness.  ?Musculoskeletal:  ?   Cervical back: Normal range of motion and neck supple.  ?   Right lower leg: Edema present.  ?   Left lower leg: Edema present.  ?   Comments: Trace edema BLE  ?Skin: ?   General: Skin is warm and dry.  ?Neurological:  ?   General: No focal deficit present.  ?   Mental Status: She is alert. Mental status is at baseline.  ?   Motor: No weakness.  ?   Coordination: Coordination normal.  ?   Gait: Gait abnormal.  ?   Comments: Oriented to person, place.   ?Psychiatric:     ?    Mood and Affect: Mood normal.     ?   Behavior: Behavior normal.  ? ? ?Labs reviewed: ?No results for input(s): NA, K, CL, CO2, GLUCOSE, BUN, CREATININE, CALCIUM, MG, PHOS in the last 8760 hours. ?No resul

## 2022-04-08 ENCOUNTER — Encounter: Payer: Self-pay | Admitting: Nurse Practitioner

## 2022-04-08 DIAGNOSIS — I5189 Other ill-defined heart diseases: Secondary | ICD-10-CM | POA: Insufficient documentation

## 2022-04-08 NOTE — Assessment & Plan Note (Signed)
MMSE 19/30 12/22,  takes Exelon, Memantine, resides Al [facility] for safety, care assistance. underwent Neurology consultation. 

## 2022-04-08 NOTE — Assessment & Plan Note (Signed)
Weight gained about #10Ibs in the past 3 months, weight down #2Ibs in the past 3 weeks. TSH 4.58, wbc wnl, Bun/creat 17/1.19 at her baseline, BNP 31 04/01/22, CXR 03/28/22 no acute pulmonary disease ?Will weigh before breakfast weekly, monitor TSH ?

## 2022-04-08 NOTE — Assessment & Plan Note (Signed)
Vit B12 deficiency, takes Vit B12, Vit B12 1010 03/02/20, Hgb 12.7 04/01/22 ?

## 2022-04-08 NOTE — Assessment & Plan Note (Signed)
Hyperlipidemia, not taking statin, LDL 64 02/11/22, takes ASA '81mg'$  qd, refused statin.  ?

## 2022-04-08 NOTE — Assessment & Plan Note (Signed)
Bun/creat 17/1.19 04/01/22 

## 2022-04-08 NOTE — Assessment & Plan Note (Signed)
no significant change.

## 2022-04-08 NOTE — Assessment & Plan Note (Addendum)
Mild swelling BLE,  SOB reported by the patient's daughter Ronnie Derby, [place] daughter wants to r/o CHF, kidney issue, fluid retention. Bun/creat 17/1.19 at her baseline, BNP 31 04/01/22, CXR 03/28/22 no acute pulmonary disease ?            Hx of grade I diastolic dysfunction 3295 per echocardiogram due to SOB ? HPOA daughter desires delaying EKG, Echocardiogram, Cardiology eval today.  ?

## 2022-04-08 NOTE — Assessment & Plan Note (Signed)
Her mood is stable,  May consider GDR of Mirtazapine in setting of weigh gain, continue Sertraline, TSH 4.58 04/01/22 ?

## 2022-04-08 NOTE — Assessment & Plan Note (Signed)
Hx of 01/08/2017, underwent cardiology evaluation, had EKG SR, Echo showed EF 65% ?

## 2022-06-19 ENCOUNTER — Ambulatory Visit: Payer: Medicare Other | Admitting: Neurology

## 2022-07-31 ENCOUNTER — Encounter: Payer: Self-pay | Admitting: Nurse Practitioner

## 2022-07-31 ENCOUNTER — Non-Acute Institutional Stay: Payer: Medicare Other | Admitting: Nurse Practitioner

## 2022-07-31 DIAGNOSIS — R600 Localized edema: Secondary | ICD-10-CM | POA: Diagnosis not present

## 2022-07-31 DIAGNOSIS — F0390 Unspecified dementia without behavioral disturbance: Secondary | ICD-10-CM

## 2022-07-31 DIAGNOSIS — N1832 Chronic kidney disease, stage 3b: Secondary | ICD-10-CM | POA: Diagnosis not present

## 2022-07-31 DIAGNOSIS — F339 Major depressive disorder, recurrent, unspecified: Secondary | ICD-10-CM

## 2022-07-31 DIAGNOSIS — E782 Mixed hyperlipidemia: Secondary | ICD-10-CM | POA: Diagnosis not present

## 2022-07-31 DIAGNOSIS — I872 Venous insufficiency (chronic) (peripheral): Secondary | ICD-10-CM

## 2022-07-31 DIAGNOSIS — I5189 Other ill-defined heart diseases: Secondary | ICD-10-CM | POA: Diagnosis not present

## 2022-07-31 DIAGNOSIS — E538 Deficiency of other specified B group vitamins: Secondary | ICD-10-CM

## 2022-07-31 NOTE — Progress Notes (Signed)
Location:  [place] Room Number: AL/802/A Place of Service:  ALF (13) Provider:  Zaxton Angerer X, NP  [name], MD  Patient Care Team: [name], MD as PCP - General (Internal Medicine) [name], MD as Referring Physician (Physical Medicine and Rehabilitation) Lajean Manes, MD as Consulting Physician (Internal Medicine)  Extended Emergency Contact Information Primary Emergency Contact: [name], [place] Phone: [phone] Relation: Daughter Secondary Emergency Contact: Myers,Lois  [place] Phone: [phone] Relation: Daughter  Code Status:  DNR Goals of care: Advanced Directive information    04/07/2022    4:16 PM  Advanced Directives  Does Patient Have a Medical Advance Directive? Yes  Type of Advance Directive Out of facility DNR (pink MOST or yellow form);Healthcare Power of Attorney  Does patient want to make changes to medical advance directive? No - Patient declined  Copy of [place] in Chart? Yes - validated most recent copy scanned in chart (See row information)  Pre-existing out of facility DNR order (yellow form or pink MOST form) Pink MOST form placed in chart (order not valid for inpatient use)     Chief Complaint  Patient presents with   Acute Visit    Patient is being seen for leg swelling    HPI:  Pt is a 88 y.o. female seen today for an acute visit for reported the patient's swelling ankles, denied pain in calves, noted some erythema lower R shin, denied itching or pain.    Mild swelling BLE,  chronic             Hx of grade I diastolic dysfunction 3016 per echocardiogram due to SOB             Hx of Hyperlipidemia, not taking statin, LDL 64 02/11/22, takes ASA '81mg'$  qd, refused statin.              CKD Bun/creat 17/1.19 04/01/22             Depression, takes Mirtazapine, Sertraline, TSH 4.58 04/01/22             Dementia,  MMSE 19/30 12/22,  takes Exelon, Memantine, resides Al [facility] for safety, care assistance. underwent Neurology consultation.              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   Dyslipidemia    Fibromyalgia    Granuloma annulare    History of uterine cancer 2003   treated with hysterectomy   Lumbar radicular syndrome    left L5   Macular degeneration    left eye   Sciatica    TIA (transient ischemic attack)    Past Surgical History:  Procedure Laterality Date   ABDOMINAL HYSTERECTOMY     BREAST BIOPSY  2007   CATARACT EXTRACTION     TONSILLECTOMY      Allergies  Allergen Reactions   Aricept [Donepezil Hcl]     Muscle cramps  Latex Itching   Tramadol Other (See Comments)    Pt can't remember what side effects she had, she doesn't take it now    Outpatient Encounter Medications as of 07/31/2022  Medication Sig   aspirin EC 81 MG tablet Take 81 mg by mouth daily.   CALCIUM-VITAMIN D PO Take 1 tablet by mouth daily. '600mg'$  calcium, 800 units Vit D   melatonin 3 MG TABS tablet Take 3 mg by mouth at bedtime.   memantine (NAMENDA) 10 MG tablet TAKE 1 TABLET BY MOUTH TWICE DAILY.   mirtazapine (REMERON) 30 MG tablet TAKE ONE TABLET AT BEDTIME.   Multiple Vitamins-Minerals (CENTRUM SILVER PO) Take 2 tablets by mouth daily.    Multiple Vitamins-Minerals (PRESERVISION AREDS PO) Take 1 capsule by mouth 2 (two) times daily.   rivastigmine (EXELON) 1.5 MG capsule Take 1 capsule (1.5 mg total) by mouth 2 (two) times daily.   sertraline (ZOLOFT) 50 MG tablet Take 1 tablet (50 mg total) by mouth daily.   UNABLE TO FIND Med Name: Hervey Ard Thought 1 capsule twice daily    vitamin B-12 (CYANOCOBALAMIN) 500 MCG tablet Take 500 mcg by mouth daily.   No facility-administered encounter medications on file as of 07/31/2022.    Review of Systems  Constitutional:  Positive for unexpected weight change. Negative for fatigue and fever.       #2Ibs in the past 3 months weight gained.   HENT:  Positive for hearing loss. Negative for congestion, trouble swallowing and voice change.   Eyes:  Negative for visual disturbance.  Respiratory:  Negative for cough and shortness of breath.        ?DOE  Cardiovascular:  Positive for leg swelling.  Gastrointestinal:  Negative for abdominal pain and constipation.  Genitourinary:  Negative for dysuria, frequency and urgency.  Musculoskeletal:  Positive for gait problem.  Skin:  Negative for color change.  Neurological:  Negative for speech difficulty and headaches.       Dementia  Psychiatric/Behavioral:  Negative for behavioral problems and sleep disturbance. The patient is not nervous/anxious.     Immunization History  Administered Date(s) Administered   Influenza, High Dose Seasonal PF 09/23/2017, 09/28/2019   Influenza,inj,Quad PF,6+ Mos 09/16/2018   Influenza-Unspecified 09/14/2017, 09/26/2020, 10/03/2021   Moderna SARS-COV2 Booster Vaccination 10/23/2020, 05/14/2021   Moderna Sars-Covid-2 Vaccination 12/19/2019, 01/16/2020, 05/14/2021   Pneumococcal Conjugate-13 02/21/2014   Pneumococcal Polysaccharide-23 02/21/2002   Pneumococcal-Unspecified 12/15/2001   Zoster, Unspecified 12/15/2005   Pertinent  Health Maintenance Due  Topic Date Due   DEXA SCAN  Never done   INFLUENZA VACCINE  07/15/2022      05/01/2017   11:19 AM 09/21/2018    4:01 PM 12/29/2018    4:11 PM 02/08/2019    1:54 PM 03/07/2020   12:54 PM  [place] in the past year? Yes No 0 0 0  Was there an injury with Fall? No  0 0   Fall Risk Category Calculator   0 0   Fall Risk Category   Low Low   Patient Fall Risk Level   Low fall risk Low fall  risk   Patient at Risk for Falls Due to Impaired balance/gait       Functional Status Survey:    Vitals:   07/31/22 1000  BP: 126/62  Pulse: 84  Resp: 16  Temp: (!) 97.5 F (36.4 C)  SpO2: 96%  Weight: 150 lb (68 kg)  Height: '5\' 1"'$  (1.549 m)   Body mass  index is 28.34 kg/m. Physical Exam Vitals and nursing note reviewed.  Constitutional:      Appearance: Normal appearance.  HENT:     Head: Normocephalic and atraumatic.     Mouth/Throat:     Mouth: Mucous membranes are moist.  Eyes:     Extraocular Movements: Extraocular movements intact.     Conjunctiva/sclera: Conjunctivae normal.     Pupils: Pupils are equal, round, and reactive to light.  Cardiovascular:     Rate and Rhythm: Normal rate and regular rhythm.     Heart sounds: No murmur heard. Pulmonary:     Breath sounds: No rales.  Abdominal:     General: Bowel sounds are normal.     Palpations: Abdomen is soft.     Tenderness: There is no abdominal tenderness.  Musculoskeletal:     Cervical back: Normal range of motion and neck supple.     Right lower leg: Edema present.     Left lower leg: Edema present.     Comments: 1-2+ edema RLE, trace LLE  Skin:    General: Skin is warm and dry.  Neurological:     General: No focal deficit present.     Mental Status: She is alert. Mental status is at baseline.     Motor: No weakness.     Coordination: Coordination normal.     Gait: Gait abnormal.     Comments: Oriented to person, place.   Psychiatric:        Mood and Affect: Mood normal.        Behavior: Behavior normal.     Labs reviewed: No results for input(s): "NA", "K", "CL", "CO2", "GLUCOSE", "BUN", "CREATININE", "CALCIUM", "MG", "PHOS" in the last 8760 hours. No results for input(s): "AST", "ALT", "ALKPHOS", "BILITOT", "PROT", "ALBUMIN" in the last 8760 hours. No results for input(s): "WBC", "NEUTROABS", "HGB", "HCT", "MCV", "PLT" in the last 8760 hours. Lab Results  Component Value Date   TSH 4.01  09/10/2020   No results found for: "HGBA1C" Lab Results  Component Value Date   CHOL 217 (A) 03/29/2021   HDL 48 03/29/2021   LDLCALC 152 03/29/2021   TRIG 70 03/29/2021   CHOLHDL 4.7 08/01/2019    Significant Diagnostic Results in last 30 days:  No results found.  Assessment/Plan Mild peripheral edema  reported the patient's swelling ankles, denied pain in calves, noted some erythema lower R shin, denied itching or pain.   Mild swelling BLE,  chronic  Venous US RLE, weight 2x/wk ac breakfast  Grade I diastolic dysfunction Hx of grade I diastolic dysfunction 5701 per echocardiogram due to SOB  Hyperlipidemia not taking statin, LDL 64 02/11/22, takes ASA '81mg'$  qd, refused statin.   CKD (chronic kidney disease) stage 3, GFR 30-59 ml/min (HCC) Bun/creat 17/1.19 04/01/22  Depression, recurrent (HCC)  takes Mirtazapine, Sertraline, TSH 4.58 04/01/22  Senile dementia ([place]) MMSE 19/30 12/22,  takes Exelon, Memantine, resides Al [facility] for safety, care assistance. underwent Neurology consultation.   Vitamin B12 deficiency  takes Vit B12, Vit B12 1010 03/02/20, Hgb 12.7 04/01/22  Venous dermatitis Mild erythema lower R shin, no open areas, apply 1% Hydrocortisone cream for now.      Family/ staff Communication: plan of care reviewed with the patient, the patient's HPOA, and charge nurse.   Labs/tests ordered: venous US RLE  Time spend 40 minutes.

## 2022-07-31 NOTE — Assessment & Plan Note (Signed)
takes Vit B12, Vit B12 1010 03/02/20, Hgb 12.7 04/01/22

## 2022-07-31 NOTE — Assessment & Plan Note (Signed)
takes Mirtazapine, Sertraline, TSH 4.58 04/01/22

## 2022-07-31 NOTE — Assessment & Plan Note (Signed)
Bun/creat 17/1.19 04/01/22

## 2022-07-31 NOTE — Assessment & Plan Note (Signed)
not taking statin, LDL 64 02/11/22, takes ASA 81mg qd, refused statin. 

## 2022-07-31 NOTE — Assessment & Plan Note (Signed)
reported the patient's swelling ankles, denied pain in calves, noted some erythema lower R shin, denied itching or pain.   Mild swelling BLE,  chronic  Venous US RLE, weight 2x/wk ac breakfast

## 2022-07-31 NOTE — Assessment & Plan Note (Signed)
MMSE 19/30 12/22,  takes Exelon, Memantine, resides Al [facility] for safety, care assistance. underwent Neurology consultation. 

## 2022-07-31 NOTE — Assessment & Plan Note (Signed)
Hx of grade I diastolic dysfunction 5747 per echocardiogram due to SOB

## 2022-08-01 ENCOUNTER — Encounter: Payer: Self-pay | Admitting: Nurse Practitioner

## 2022-08-01 DIAGNOSIS — I872 Venous insufficiency (chronic) (peripheral): Secondary | ICD-10-CM | POA: Insufficient documentation

## 2022-08-01 NOTE — Assessment & Plan Note (Signed)
Mild erythema lower R shin, no open areas, apply 1% Hydrocortisone cream for now.

## 2022-08-12 ENCOUNTER — Encounter: Payer: Self-pay | Admitting: Nurse Practitioner

## 2022-08-12 ENCOUNTER — Non-Acute Institutional Stay: Payer: Medicare Other | Admitting: Nurse Practitioner

## 2022-08-12 DIAGNOSIS — N1832 Chronic kidney disease, stage 3b: Secondary | ICD-10-CM

## 2022-08-12 DIAGNOSIS — I872 Venous insufficiency (chronic) (peripheral): Secondary | ICD-10-CM | POA: Diagnosis not present

## 2022-08-12 DIAGNOSIS — E538 Deficiency of other specified B group vitamins: Secondary | ICD-10-CM | POA: Diagnosis not present

## 2022-08-12 DIAGNOSIS — F0390 Unspecified dementia without behavioral disturbance: Secondary | ICD-10-CM | POA: Diagnosis not present

## 2022-08-12 DIAGNOSIS — F339 Major depressive disorder, recurrent, unspecified: Secondary | ICD-10-CM | POA: Diagnosis not present

## 2022-08-12 DIAGNOSIS — R600 Localized edema: Secondary | ICD-10-CM

## 2022-08-12 DIAGNOSIS — E782 Mixed hyperlipidemia: Secondary | ICD-10-CM

## 2022-08-12 DIAGNOSIS — I5189 Other ill-defined heart diseases: Secondary | ICD-10-CM

## 2022-08-12 NOTE — Assessment & Plan Note (Signed)
MMSE 19/30 12/22,  takes Exelon, Memantine, resides Al [facility] for safety, care assistance. underwent Neurology consultation. Suspicious and resistance for assistance at times.

## 2022-08-12 NOTE — Assessment & Plan Note (Signed)
Showed anxious mood, progression of dementia is contributory, will increase Sertraline to '75mg'$  qd, continue Mirtazapine, observe.

## 2022-08-12 NOTE — Assessment & Plan Note (Signed)
takes Vit B12, Vit B12 1010 03/02/20, Hgb 12.7 04/01/22

## 2022-08-12 NOTE — Progress Notes (Addendum)
Location:   [place] Room Number: [place] of Service:  ALF (13) Provider:  Tray Klayman X, NP  [name], MD  Patient Care Team: [name], MD as PCP - General (Internal Medicine) [name], MD as Referring Physician (Physical Medicine and Rehabilitation) Lajean Manes, MD as Consulting Physician (Internal Medicine)  Extended Emergency Contact Information Primary Emergency Contact: [name], [place] Phone: [phone] Relation: Daughter Secondary Emergency Contact: Myers,Lois  [place] Phone: [phone] Relation: Daughter  Code Status:  DNR Goals of care: Advanced Directive information    08/12/2022    8:26 AM  Advanced Directives  Does Patient Have a Medical Advance Directive? Yes  Type of Paramedic of [place];Living will;Out of facility DNR (pink MOST or yellow form)  Does patient want to make changes to medical advance directive? No - Patient declined  Copy of [place] in Chart? Yes - validated most recent copy scanned in chart (See row information)  Pre-existing out of facility DNR order (yellow form or pink MOST form) Pink MOST form placed in chart (order not valid for inpatient use)     Chief Complaint  Patient presents with  . Acute Visit    Anxiety, swelling legs    HPI:  Pt is a 88 y.o. female seen today for an acute visit for reevaluate swelling legs, anxiety.    The  patient's swelling ankles/BLE, mostly right, weight down about #2Ibs since 3 day course Furosemide, 08/04/22 negative DVT, denied pain in calves, noted some erythema lower R shin, better on Hydrocortisone cream, denied itching or pain.               Mild swelling BLE,  chronic             Hx of grade I diastolic dysfunction 0768 per echocardiogram due to SOB             Hx of Hyperlipidemia, not taking statin, LDL 64 02/11/22, takes ASA  $Re'81mg'ocs$  qd, refused statin.              CKD Bun/creat 17/1.19 04/01/22             Depression, takes Mirtazapine, Sertraline, TSH 4.58 04/01/22             Dementia, MMSE 19/30 12/22,  takes Exelon, Memantine, resides Al [facility] for safety, care assistance. underwent Neurology consultation. Suspicious and resistance for assistance at times.              Vit B12 deficiency, takes Vit B12, Vit B12 1010 03/02/20, Hgb 12.7 04/01/22             Dyspnea, Hx of 0/88/1103, Grade I diastolic dysfunction 1594 per echocardiogram due to SOB   Past Medical History:  Diagnosis Date  . BPV (benign positional vertigo)   . Cancer [facility])    uterine cancer  . Carpal tunnel syndrome on left 06/02/2017  . Cognitive changes   . Depression   . Droopy eyelid, right   . 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 Dyslipidemia   . Fibromyalgia   . Granuloma annulare   . History of uterine cancer 2003   treated with hysterectomy  . Lumbar radicular syndrome    left L5  . Macular degeneration    left eye  . Sciatica   . TIA (transient ischemic attack)  Past Surgical History:  Procedure Laterality Date  . ABDOMINAL HYSTERECTOMY    . BREAST BIOPSY  2007  . CATARACT EXTRACTION    . TONSILLECTOMY      Allergies  Allergen Reactions  . Aricept [Donepezil Hcl]     Muscle cramps  . Latex Itching  . Tramadol Other (See Comments)    Pt can't remember what side effects she had, she doesn't take it now    Allergies as of 08/12/2022       Reactions   Aricept [donepezil Hcl]    Muscle cramps   Latex Itching   Tramadol Other (See Comments)   Pt can't remember what side effects she had, she doesn't take it now        Medication List        Accurate as of August 12, 2022 11:59 PM. If you have any questions, ask your nurse or doctor.          aspirin EC 81 MG tablet Take 81 mg by mouth daily.   CALCIUM-VITAMIN D PO Take 1 tablet by mouth daily. $RemoveBefo'600mg'CnpclvWCpJJ$  calcium, 800 units Vit D   hydrocortisone 1 %  lotion Apply 1 Application topically 2 (two) times daily. apply to reddened area on RLE twice a day until healed   melatonin 3 MG Tabs tablet Take 3 mg by mouth at bedtime.   memantine 10 MG tablet Commonly known as: NAMENDA TAKE 1 TABLET BY MOUTH TWICE DAILY.   mirtazapine 30 MG tablet Commonly known as: REMERON TAKE ONE TABLET AT BEDTIME.   CENTRUM SILVER PO Take 2 tablets by mouth daily.   PRESERVISION AREDS PO Take 1 capsule by mouth 2 (two) times daily.   rivastigmine 1.5 MG capsule Commonly known as: EXELON Take 1 capsule (1.5 mg total) by mouth 2 (two) times daily.   sertraline 50 MG tablet Commonly known as: ZOLOFT Take 1 tablet (50 mg total) by mouth daily.   UNABLE TO FIND Med Name: Hervey Ard Thought 1 capsule twice daily   vitamin B-12 500 MCG tablet Commonly known as: CYANOCOBALAMIN Take 500 mcg by mouth daily.        Review of Systems  Constitutional:  Positive for unexpected weight change. Negative for fatigue and fever.       #2Ibs weight loss after 3 day course of Furosemide.   HENT:  Positive for hearing loss. Negative for congestion, trouble swallowing and voice change.   Eyes:  Negative for visual disturbance.  Respiratory:  Negative for cough and shortness of breath.        ?DOE  Cardiovascular:  Positive for leg swelling.  Gastrointestinal:  Negative for abdominal pain and constipation.  Genitourinary:  Negative for dysuria, frequency and urgency.  Musculoskeletal:  Positive for gait problem.  Skin:  Negative for color change.  Neurological:  Negative for speech difficulty and headaches.       Dementia  Psychiatric/Behavioral:  Negative for behavioral problems and sleep disturbance. The patient is nervous/anxious.        Resistance to assistance at times.     Immunization History  Administered Date(s) Administered  . Influenza, High Dose Seasonal PF 09/23/2017, 09/28/2019  . Influenza,inj,Quad PF,6+ Mos 09/16/2018  . Influenza-Unspecified  09/14/2017, 09/26/2020, 10/03/2021  . Moderna SARS-COV2 Booster Vaccination 10/23/2020, 05/14/2021  . Moderna Sars-Covid-2 Vaccination 12/19/2019, 01/16/2020, 05/14/2021  . Pneumococcal Conjugate-13 02/21/2014  . Pneumococcal Polysaccharide-23 02/21/2002  . Pneumococcal-Unspecified 12/15/2001  . Zoster, Unspecified 12/15/2005   Pertinent  Health Maintenance Due  Topic Date  Due  . DEXA SCAN  Never done  . INFLUENZA VACCINE  07/15/2022      05/01/2017   11:19 AM 09/21/2018    4:01 PM 12/29/2018    4:11 PM 02/08/2019    1:54 PM 03/07/2020   12:54 PM  Fall Risk  Falls in the past year? Yes No 0 0 0  Was there an injury with Fall? No  0 0   Fall Risk Category Calculator   0 0   Fall Risk Category   Low Low   Patient Fall Risk Level   Low fall risk Low fall risk   Patient at Risk for Falls Due to Impaired balance/gait       Functional Status Survey:    Vitals:   08/12/22 0811  BP: (!) 123/59  Pulse: 63  Resp: 18  Temp: (!) 97.1 F (36.2 C)  SpO2: 95%  Weight: 148 lb 9.6 oz (67.4 kg)  Height: $Remove'5\' 1"'aFGhqmP$  (1.549 m)   Body mass index is 28.08 kg/m. Physical Exam Vitals and nursing note reviewed.  Constitutional:      Appearance: Normal appearance.  HENT:     Head: Normocephalic and atraumatic.     Mouth/Throat:     Mouth: Mucous membranes are moist.  Eyes:     Extraocular Movements: Extraocular movements intact.     Conjunctiva/sclera: Conjunctivae normal.     Pupils: Pupils are equal, round, and reactive to light.  Cardiovascular:     Rate and Rhythm: Normal rate and regular rhythm.     Heart sounds: No murmur heard. Pulmonary:     Breath sounds: No rales.  Abdominal:     General: Bowel sounds are normal.     Palpations: Abdomen is soft.     Tenderness: There is no abdominal tenderness.  Musculoskeletal:     Cervical back: Normal range of motion and neck supple.     Right lower leg: Edema present.     Left lower leg: Edema present.     Comments: 1+ edema RLE, trace  LLE  Skin:    General: Skin is warm and dry.  Neurological:     General: No focal deficit present.     Mental Status: She is alert. Mental status is at baseline.     Motor: No weakness.     Coordination: Coordination normal.     Gait: Gait abnormal.     Comments: Oriented to person, place.   Psychiatric:        Behavior: Behavior normal.     Comments: Suspicious    Labs reviewed: Recent Labs    04/02/22 0000  NA 138  K 4.4  CL 104  CO2 23*  BUN 17  CREATININE 1.2*  CALCIUM 9.2   Recent Labs    04/02/22 0000  AST 18  ALT 15  ALBUMIN 4.1   Recent Labs    04/02/22 0000  WBC 6.5  NEUTROABS 3,829.00  HGB 12.7  HCT 37  PLT 270   Lab Results  Component Value Date   TSH 4.58 04/02/2022   No results found for: "HGBA1C" Lab Results  Component Value Date   CHOL 217 (A) 03/29/2021   HDL 48 03/29/2021   LDLCALC 152 03/29/2021   TRIG 70 03/29/2021   CHOLHDL 4.7 08/01/2019    Significant Diagnostic Results in last 30 days:  No results found.  Assessment/Plan Venous dermatitis 08/04/22 venous US RLE no acute DVT. Better erythematous shins better on Hydrocortisone cream helped.   Depression,  recurrent ([place]) Showed anxious mood, progression of dementia is contributory, will increase Sertraline to 38m qd, continue Mirtazapine, observe.   Senile dementia ([place] MMSE 19/30 12/22,  [facility] Exelon, Memantine[facility] underwent Neurology consultation. Suspicious and resistance for assistance at times.   Vitamin B12 deficiency takes Vit B12, Vit B12 1010 03/02/20, Hgb 12.7 46/56/81 Grade I diastolic dysfunction  Hx of 12/75/1700 Grade I diastolic dysfunction 21749per echocardiogram due to SOB   CKD (chronic kidney disease) stage 3, GFR 30-59 ml/min (HCC) Bun/creat 17/1.19 04/01/22  Hyperlipidemia  not taking statin, LDL 64 02/11/22, takes ASA 850mqd, refused statin.   Mild peripheral edema  The  patient's swelling ankles/BLE,  mostly right, weight down about #2Ibs since 3 day course Furosemide, 08/04/22 negative DVT, denied pain in calves, noted some erythema lower R shin, better on Hydrocortisone cream, denied itching or pain.               Mild swelling BLE,  chronic             Hx of grade I diastolic dysfunction 204496er echocardiogram due to SOB  08/11/22 wt weekly, prn Furosemide 2086mcl 59m61meekly if weight gained >3-5 Ibs/week. Repeat echocardiogram, update CBC/diff, CMP/eGFR, TSH BNP  08/14/22 Na 141, K 4.5, Bun 23, creat 1.18, wbc 5.8, Hgb 12.8, plt 249, BNP 51     Family/ staff Communication: plan of care reviewed with the patient, the patient's HOPA daughter, and charge nurse.   Labs/tests ordered: CBC/diff, CMP/eGFR, BNP, TSH, echocardiogram  Time spend 40 minutes.

## 2022-08-12 NOTE — Assessment & Plan Note (Signed)
Hx of 07/11/9790, Grade I diastolic dysfunction 5041 per echocardiogram due to SOB

## 2022-08-12 NOTE — Assessment & Plan Note (Addendum)
08/04/22 venous US RLE no acute DVT. Better erythematous shins better on Hydrocortisone cream helped.

## 2022-08-12 NOTE — Assessment & Plan Note (Signed)
not taking statin, LDL 64 02/11/22, takes ASA 81mg qd, refused statin. 

## 2022-08-12 NOTE — Assessment & Plan Note (Addendum)
The  patient's swelling ankles/BLE, mostly right, weight down about #2Ibs since 3 day course Furosemide, 08/04/22 negative DVT, denied pain in calves, noted some erythema lower R shin, better on Hydrocortisone cream, denied itching or pain.               Mild swelling BLE,  chronic             Hx of grade I diastolic dysfunction 9810 per echocardiogram due to SOB  08/11/22 wt weekly, prn Furosemide 33m/Kcl 269m weekly if weight gained >3-5 Ibs/week. Repeat echocardiogram, update CBC/diff, CMP/eGFR, TSH BNP  08/14/22 Na 141, K 4.5, Bun 23, creat 1.18, wbc 5.8, Hgb 12.8, plt 249, BNP 51

## 2022-08-12 NOTE — Assessment & Plan Note (Signed)
Bun/creat 17/1.19 04/01/22

## 2022-08-22 ENCOUNTER — Encounter: Payer: Self-pay | Admitting: Nurse Practitioner

## 2022-08-22 ENCOUNTER — Non-Acute Institutional Stay: Payer: Medicare Other | Admitting: Nurse Practitioner

## 2022-08-22 DIAGNOSIS — R0600 Dyspnea, unspecified: Secondary | ICD-10-CM

## 2022-08-22 DIAGNOSIS — E538 Deficiency of other specified B group vitamins: Secondary | ICD-10-CM

## 2022-08-22 DIAGNOSIS — R635 Abnormal weight gain: Secondary | ICD-10-CM

## 2022-08-22 DIAGNOSIS — F0390 Unspecified dementia without behavioral disturbance: Secondary | ICD-10-CM

## 2022-08-22 DIAGNOSIS — F339 Major depressive disorder, recurrent, unspecified: Secondary | ICD-10-CM

## 2022-08-22 DIAGNOSIS — N1832 Chronic kidney disease, stage 3b: Secondary | ICD-10-CM

## 2022-08-22 DIAGNOSIS — E782 Mixed hyperlipidemia: Secondary | ICD-10-CM

## 2022-08-22 NOTE — Assessment & Plan Note (Signed)
Dyspnea, Hx of 8/91/6945, Grade I diastolic dysfunction 0388 per echocardiogram due to SOB. 08/18/22 Echo EF 75%, pulmonary hypertension, mild mitral/tricuspid valves insufficiency.

## 2022-08-22 NOTE — Assessment & Plan Note (Signed)
MMSE 19/30 12/22,  takes Exelon, Memantine, resides Al [facility] for safety, care assistance. underwent Neurology consultation. Suspicious and resistance for assistance at times.

## 2022-08-22 NOTE — Progress Notes (Unsigned)
Location:   [place] Room Number: 802 A Place of Service:  ALF (13) Provider: Lennie Odor Marsena Taff NP  [name], MD  Patient Care Team: [name], MD as PCP - General (Internal Medicine) [name], MD as Referring Physician (Physical Medicine and Rehabilitation) Lajean Manes, MD as Consulting Physician (Internal Medicine)  Extended Emergency Contact Information Primary Emergency Contact: [name], [place] Phone: [phone] Relation: Daughter Secondary Emergency Contact: Myers,Lois  [place] Phone: [phone] Relation: Daughter  Code Status: DNR Goals of care: Advanced Directive information    08/22/2022   12:01 PM  Advanced Directives  Does Patient Have a Medical Advance Directive? Yes  Type of Paramedic of [place];Living will;Out of facility DNR (pink MOST or yellow form)  Does patient want to make changes to medical advance directive? No - Patient declined  Copy of [place] in Chart? Yes - validated most recent copy scanned in chart (See row information)  Pre-existing out of facility DNR order (yellow form or pink MOST form) Pink MOST form placed in chart (order not valid for inpatient use)     Chief Complaint  Patient presents with   Acute Visit    Swollen legs    HPI:  Pt is a 88 y.o. female seen today for an acute visit for weight gained #3Ibs in the past week, more swelling in BLE, DOE at her baseline, no new respiratory symptoms.      The  patient's swelling ankles/BLE, mostly right, 08/04/22 negative DVT, BNP 51 08/14/22             Hx of grade I diastolic dysfunction 2426 per echocardiogram due to SOB   08/18/22 EF 75%, pulmonary hypertension, mild tricuspid/mitral valve insufficiency. 08/22/22 schedule Furosemide 57m/Kcl 258m M+Thr             Hx of Hyperlipidemia, not taking statin, LDL 64 02/11/22, takes ASA 8181md,  refused statin.              CKD Bun/creat 23/1.18 08/14/22             Depression, takes Mirtazapine, Sertraline, TSH 4.58 04/01/22             Dementia, MMSE 19/30 12/22,  takes Exelon, Memantine, [facility] care [facility]. underwent Neurology consultation. Suspicious and resistance for assistance at times.              Vit B12 deficiency, takes Vit B12, Vit B12 1010 03/02/20, Hgb 12.8 08/14/22             Dyspnea, Hx of 01/11/33/1962rade I diastolic dysfunction [id number] echocardiogram due to SOB. 08/18/22 Echo EF 75%, pulmonary hypertension, mild mitral/tricuspid valves insufficiency.     Past Surgical History:  Procedure Laterality Date   ABDOMINAL HYSTERECTOMY     BREAST BIOPSY  2007   CATARACT EXTRACTION     TONSILLECTOMY      Allergies  Allergen Reactions   Aricept [Donepezil Hcl]     Muscle cramps   Latex Itching   Tramadol Other (See Comments)    Pt can't remember what side effects she had, she doesn't take it now    Allergies as of 08/22/2022       Reactions   Aricept [donepezil Hcl]    Muscle cramps   Latex Itching   Tramadol Other (See Comments)  Pt can't remember what side effects she had, she doesn't take it now        Medication List        Accurate as of August 22, 2022 11:59 PM. If you have any questions, ask your nurse or doctor.          STOP taking these medications    hydrocortisone 1 % lotion Stopped by: Rohen Kimes X Kaelynn Igo, NP       TAKE these medications    aspirin EC 81 MG tablet Take 81 mg by mouth daily.   CALCIUM-VITAMIN D PO Take 1 tablet by mouth daily. 680m calcium, 800 units Vit D   furosemide 20 MG tablet Commonly known as: LASIX Take 20 mg by mouth.   melatonin 3 MG Tabs tablet Take 3 mg by mouth at bedtime.   memantine 10 MG tablet Commonly known as: NAMENDA TAKE 1 TABLET BY MOUTH TWICE DAILY.   mirtazapine 30 MG tablet Commonly known as: REMERON TAKE ONE TABLET AT BEDTIME.   potassium chloride SA 20  MEQ tablet Commonly known as: KLOR-CON M Take 20 mEq by mouth daily. On Wednesday   CENTRUM SILVER PO Take 2 tablets by mouth daily.   PRESERVISION AREDS PO Take 1 capsule by mouth 2 (two) times daily.   rivastigmine 1.5 MG capsule Commonly known as: EXELON Take 1 capsule (1.5 mg total) by mouth 2 (two) times daily.   sertraline 50 MG tablet Commonly known as: ZOLOFT Take 1 tablet (50 mg total) by mouth daily.   sodium fluoride 1.1 % Crea dental cream Commonly known as: PREVIDENT 5000 PLUS Place 1 Application onto teeth daily.   UNABLE TO FIND Med Name: SHervey ArdThought 1 capsule twice daily   vitamin B-12 500 MCG tablet Commonly known as: CYANOCOBALAMIN Take 500 mcg by mouth daily.        Review of Systems  Constitutional:  Positive for unexpected weight change. Negative for fatigue and fever.       #3Ibs weight gained past week  HENT:  Positive for hearing loss. Negative for congestion, trouble swallowing and voice change.   Eyes:  Negative for visual disturbance.  Respiratory:  Negative for cough and shortness of breath.        ?DOE  Cardiovascular:  Positive for leg swelling.  Gastrointestinal:  Negative for abdominal pain and constipation.  Genitourinary:  Negative for dysuria, frequency and urgency.  Musculoskeletal:  Positive for gait problem.  Skin:  Negative for color change.  Neurological:  Negative for speech difficulty and headaches.       Dementia  Psychiatric/Behavioral:  Negative for behavioral problems and sleep disturbance. The patient is nervous/anxious.        Resistance to assistance at times.     Immunization History  Administered Date(s) Administered   Influenza, High Dose Seasonal PF 09/23/2017, 09/28/2019   Influenza,inj,Quad PF,6+ Mos 09/16/2018   Influenza-Unspecified 09/14/2017, 09/26/2020, 10/03/2021   Moderna SARS-COV2 Booster Vaccination 10/23/2020, 05/14/2021   Moderna Sars-Covid-2 Vaccination 12/19/2019, 01/16/2020, 05/14/2021    Pneumococcal Conjugate-13 02/21/2014   Pneumococcal Polysaccharide-23 02/21/2002   Pneumococcal-Unspecified 12/15/2001   Zoster, Unspecified 12/15/2005   Pertinent  Health Maintenance Due  Topic Date Due   DEXA SCAN  Never done   INFLUENZA VACCINE  07/15/2022      05/01/2017   11:19 AM 09/21/2018    4:01 PM 12/29/2018    4:11 PM 02/08/2019    1:54 PM 03/07/2020   12:54 PM  Fall Risk  Falls in the  past year? Yes No 0 0 0  Was there an injury with Fall? No  0 0   Fall Risk Category Calculator   0 0   Fall Risk Category   Low Low   Patient Fall Risk Level   Low fall risk Low fall risk   Patient at Risk for Falls Due to Impaired balance/gait       Functional Status Survey:    Vitals:   08/22/22 1130  Temp: (!) 97.3 F (36.3 C)  SpO2: 96%  Weight: 153 lb 3.2 oz (69.5 kg)  Height: '5\' 1"'  (1.549 m)   Body mass index is 28.95 kg/m. Physical Exam Vitals and nursing note reviewed.  Constitutional:      Appearance: Normal appearance.  HENT:     Head: Normocephalic and atraumatic.     Mouth/Throat:     Mouth: Mucous membranes are moist.  Eyes:     Extraocular Movements: Extraocular movements intact.     Conjunctiva/sclera: Conjunctivae normal.     Pupils: Pupils are equal, round, and reactive to light.  Cardiovascular:     Rate and Rhythm: Normal rate and regular rhythm.     Heart sounds: No murmur heard. Pulmonary:     Breath sounds: Rales present.     Comments: Bibasilar rales.  Abdominal:     General: Bowel sounds are normal.     Palpations: Abdomen is soft.     Tenderness: There is no abdominal tenderness.  Musculoskeletal:     Cervical back: Normal range of motion and neck supple.     Right lower leg: Edema present.     Left lower leg: Edema present.     Comments: 1+ edema RLE, trace LLE  Skin:    General: Skin is warm and dry.  Neurological:     General: No focal deficit present.     Mental Status: She is alert. Mental status is at baseline.     Motor: No  weakness.     Coordination: Coordination normal.     Gait: Gait abnormal.     Comments: Oriented to person, place.   Psychiatric:        Behavior: Behavior normal.     Comments: Suspicious     Labs reviewed: Recent Labs    04/02/22 0000  NA 138  K 4.4  CL 104  CO2 23*  BUN 17  CREATININE 1.2*  CALCIUM 9.2   Recent Labs    04/02/22 0000  AST 18  ALT 15  ALBUMIN 4.1   Recent Labs    04/02/22 0000  WBC 6.5  NEUTROABS 3,829.00  HGB 12.7  HCT 37  PLT 270   Lab Results  Component Value Date   TSH 4.58 04/02/2022   No results found for: "HGBA1C" Lab Results  Component Value Date   CHOL 217 (A) 03/29/2021   HDL 48 03/29/2021   LDLCALC 152 03/29/2021   TRIG 70 03/29/2021   CHOLHDL 4.7 08/01/2019    Significant Diagnostic Results in last 30 days:  No results found.  Assessment/Plan: Weight gain Weight gained #3Ibs in the past week, more swelling in BLE, DOE at her baseline, no new respiratory symptoms.  The  patient's swelling ankles/BLE, mostly right, 08/04/22 negative DVT, BNP 51 08/14/22 08/18/22 EF 75%, pulmonary hypertension, mild tricuspid/mitral valve insufficiency. 08/22/22 schedule Furosemide 39m/Kcl 268m M+Thr, CMP/eGFR, CBC/diff 2 weeks.   Hyperlipidemia Hx of Hyperlipidemia, not taking statin, LDL 64 02/11/22, takes ASA 8142md, refused statin.   Depression, recurrent (  [place]) takes Mirtazapine, Sertraline, TSH 4.58 04/01/22  Senile dementia ([place]) MMSE 19/30 12/22,  takes Exelon, Memantine, resides [facility] for safety, care assistance. underwent Neurology consultation. Suspicious and resistance for assistance at times.   CKD (chronic kidney disease) stage 3, GFR 30-59 ml/min (HCC) Bun/creat 23/1.18 08/14/22  Vitamin B12 deficiency  takes Vit B12, Vit B12 1010 03/02/20, Hgb 12.8 08/14/22  Dyspnea  Dyspnea, Hx of 8/72/7618, Grade I diastolic dysfunction 4859 per echocardiogram due to SOB. 08/18/22 Echo EF 75%, pulmonary hypertension, mild mitral/tricuspid  valves insufficiency.     Family/ staff Communication: plan of care reviewed with the patient, the patient's daughter, HPOA, and charge nurse.   Labs/tests ordered:  CBC/diff, CMP/eGFR 2 weeks  Time spend 40 minutes.

## 2022-08-22 NOTE — Assessment & Plan Note (Signed)
Bun/creat 23/1.18 08/14/22

## 2022-08-22 NOTE — Assessment & Plan Note (Signed)
takes Vit B12, Vit B12 1010 03/02/20, Hgb 12.8 08/14/22

## 2022-08-22 NOTE — Assessment & Plan Note (Signed)
Hx of Hyperlipidemia, not taking statin, LDL 64 02/11/22, takes ASA '81mg'$  qd, refused statin.

## 2022-08-22 NOTE — Assessment & Plan Note (Signed)
takes Mirtazapine, Sertraline, TSH 4.58 04/01/22

## 2022-08-22 NOTE — Assessment & Plan Note (Signed)
Weight gained #3Ibs in the past week, more swelling in BLE, DOE at her baseline, no new respiratory symptoms.  The  patient's swelling ankles/BLE, mostly right, 08/04/22 negative DVT, BNP 51 08/14/22 08/18/22 EF 75%, pulmonary hypertension, mild tricuspid/mitral valve insufficiency. 08/22/22 schedule Furosemide 67m/Kcl 242m M+Thr, CMP/eGFR, CBC/diff 2 weeks.

## 2022-08-25 ENCOUNTER — Encounter: Payer: Self-pay | Admitting: Nurse Practitioner

## 2022-08-29 ENCOUNTER — Encounter: Payer: Self-pay | Admitting: Nurse Practitioner

## 2022-08-29 NOTE — Progress Notes (Unsigned)
Location:  [place] Room Number: AL/802/A Place of Service:  ALF (13) Provider:  Mast, Man X, NP  Patient Care Team: [name], MD as PCP - General (Internal Medicine) [name], MD as Referring Physician (Physical Medicine and Rehabilitation) Lajean Manes, MD as Consulting Physician (Internal Medicine)  Extended Emergency Contact Information Primary Emergency Contact: [name], [place] Phone: [phone] Relation: Daughter Secondary Emergency Contact: Myers,Lois  [place] Phone: [phone] Relation: Daughter  Code Status:  DNR Goals of care: Advanced Directive information    08/22/2022   12:01 PM  Advanced Directives  Does Patient Have a Medical Advance Directive? Yes  Type of Paramedic of [place];Living will;Out of facility DNR (pink MOST or yellow form)  Does patient want to make changes to medical advance directive? No - Patient declined  Copy of [place] in Chart? Yes - validated most recent copy scanned in chart (See row information)  Pre-existing out of facility DNR order (yellow form or pink MOST form) Pink MOST form placed in chart (order not valid for inpatient use)     Chief Complaint  Patient presents with   Acute Visit    Patient is being seen for increased confusion    HPI:  Pt is a 88 y.o. 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   Dyslipidemia    Fibromyalgia    Granuloma annulare    History of uterine cancer 2003   treated with hysterectomy   Lumbar radicular syndrome    left L5   Macular degeneration    left eye   Sciatica    TIA (transient  ischemic attack)    Past Surgical History:  Procedure Laterality Date   ABDOMINAL HYSTERECTOMY     BREAST BIOPSY  2007   CATARACT EXTRACTION     TONSILLECTOMY      Allergies  Allergen Reactions   Aricept [Donepezil Hcl]     Muscle cramps   Latex Itching   Tramadol Other (See Comments)    Pt can't remember what side effects she had, she doesn't take it now    Outpatient Encounter Medications as of 08/29/2022  Medication Sig   aspirin EC 81 MG tablet Take 81 mg by mouth daily.   CALCIUM-VITAMIN D PO Take 1 tablet by mouth daily. '600mg'$  calcium, 800 units Vit D   furosemide (LASIX) 20 MG tablet Take 20 mg by mouth.   melatonin 3 MG TABS tablet Take 3 mg by mouth at bedtime.   memantine (NAMENDA) 10 MG tablet TAKE 1 TABLET BY MOUTH TWICE DAILY.   mirtazapine (REMERON) 30 MG tablet TAKE ONE TABLET AT BEDTIME.   Multiple Vitamins-Minerals (CENTRUM SILVER PO) Take 2 tablets by mouth daily.    Multiple Vitamins-Minerals (PRESERVISION AREDS PO) Take 1 capsule by mouth 2 (two) times daily.   potassium chloride SA (KLOR-CON M) 20 MEQ tablet Take 20 mEq by mouth daily. On Wednesday   rivastigmine (EXELON) 1.5 MG capsule Take 1 capsule (1.5 mg total) by mouth 2 (two) times daily.   sertraline (ZOLOFT)  50 MG tablet Take 1 tablet (50 mg total) by mouth daily.   sodium fluoride (PREVIDENT 5000 PLUS) 1.1 % CREA dental cream Place 1 Application onto teeth daily.   UNABLE TO FIND Med Name: Hervey Ard Thought 1 capsule twice daily   vitamin B-12 (CYANOCOBALAMIN) 500 MCG tablet Take 500 mcg by mouth daily.   No facility-administered encounter medications on file as of 08/29/2022.    Review of Systems  Immunization History  Administered Date(s) Administered   Influenza, High Dose Seasonal PF 09/23/2017, 09/28/2019   Influenza,inj,Quad PF,6+ Mos 09/16/2018   Influenza-Unspecified 09/14/2017, 09/26/2020, 10/03/2021   Moderna SARS-COV2 Booster Vaccination 10/23/2020, 05/14/2021   Moderna Sars-Covid-2  Vaccination 12/19/2019, 01/16/2020, 05/14/2021   Pneumococcal Conjugate-13 02/21/2014   Pneumococcal Polysaccharide-23 02/21/2002   Pneumococcal-Unspecified 12/15/2001   Zoster, Unspecified 12/15/2005   Pertinent  Health Maintenance Due  Topic Date Due   DEXA SCAN  Never done   INFLUENZA VACCINE  07/15/2022      05/01/2017   11:19 AM 09/21/2018    4:01 PM 12/29/2018    4:11 PM 02/08/2019    1:54 PM 03/07/2020   12:54 PM  [place] in the past year? Yes No 0 0 0  Was there an injury with Fall? No  0 0   Fall Risk Category Calculator   0 0   Fall Risk Category   Low Low   Patient Fall Risk Level   Low fall risk Low fall risk   Patient at Risk for Falls Due to Impaired balance/gait       Functional Status Survey:    Vitals:   08/29/22 1117  BP: 120/62  Pulse: 81  Resp: 14  Temp: 97.6 F (36.4 C)  SpO2: 97%  Weight: 151 lb (68.5 kg)  Height: '5\' 1"'$  (1.549 m)   Body mass index is 28.53 kg/m. Physical Exam  Labs reviewed: Recent Labs    04/02/22 0000  NA 138  K 4.4  CL 104  CO2 23*  BUN 17  CREATININE 1.2*  CALCIUM 9.2   Recent Labs    04/02/22 0000  AST 18  ALT 15  ALBUMIN 4.1   Recent Labs    04/02/22 0000  WBC 6.5  NEUTROABS 3,829.00  HGB 12.7  HCT 37  PLT 270   Lab Results  Component Value Date   TSH 4.58 04/02/2022   No results found for: "HGBA1C" Lab Results  Component Value Date   CHOL 217 (A) 03/29/2021   HDL 48 03/29/2021   LDLCALC 152 03/29/2021   TRIG 70 03/29/2021   CHOLHDL 4.7 08/01/2019    Significant Diagnostic Results in last 30 days:  No results found.  Assessment/Plan There are no diagnoses linked to this encounter.   Family/ staff Communication: ***  Labs/tests ordered:  ***

## 2022-09-04 ENCOUNTER — Encounter: Payer: Self-pay | Admitting: Nurse Practitioner

## 2022-09-04 ENCOUNTER — Non-Acute Institutional Stay: Payer: Medicare Other | Admitting: Nurse Practitioner

## 2022-09-04 DIAGNOSIS — F339 Major depressive disorder, recurrent, unspecified: Secondary | ICD-10-CM

## 2022-09-04 DIAGNOSIS — E782 Mixed hyperlipidemia: Secondary | ICD-10-CM

## 2022-09-04 DIAGNOSIS — N1832 Chronic kidney disease, stage 3b: Secondary | ICD-10-CM

## 2022-09-04 DIAGNOSIS — I5189 Other ill-defined heart diseases: Secondary | ICD-10-CM | POA: Diagnosis not present

## 2022-09-04 DIAGNOSIS — R0600 Dyspnea, unspecified: Secondary | ICD-10-CM

## 2022-09-04 DIAGNOSIS — E538 Deficiency of other specified B group vitamins: Secondary | ICD-10-CM

## 2022-09-04 DIAGNOSIS — F0390 Unspecified dementia without behavioral disturbance: Secondary | ICD-10-CM

## 2022-09-04 LAB — HEPATIC FUNCTION PANEL
ALT: 20 U/L (ref 7–35)
AST: 20 (ref 13–35)
Alkaline Phosphatase: 59 (ref 25–125)
Bilirubin, Total: 0.5

## 2022-09-04 LAB — COMPREHENSIVE METABOLIC PANEL
Albumin: 3.7 (ref 3.5–5.0)
Calcium: 9.2 (ref 8.7–10.7)
Globulin: 2.6
eGFR: 41

## 2022-09-04 LAB — CBC AND DIFFERENTIAL
HCT: 36 (ref 36–46)
Hemoglobin: 12.2 (ref 12.0–16.0)
Neutrophils Absolute: 2938
WBC: 5.1

## 2022-09-04 LAB — BASIC METABOLIC PANEL
BUN: 24 — AB (ref 4–21)
CO2: 27 — AB (ref 13–22)
Chloride: 104 (ref 99–108)
Creatinine: 1.3 — AB (ref 0.5–1.1)
Glucose: 91
Potassium: 4.4 mEq/L (ref 3.5–5.1)
Sodium: 138 (ref 137–147)

## 2022-09-04 LAB — CBC: RBC: 3.75 — AB (ref 3.87–5.11)

## 2022-09-04 NOTE — Assessment & Plan Note (Signed)
reported the patient's increased agitation, irritability. The patient was conversing upon my visit, smiled, denied pain, nausea, vomiting, cough, SOB, dysuria, urinary urgency, urinary frequency, or abd/lower back pain. She is afebrile. Pending CBC, BMP today. The patient's daughter desires to reduce Sertraline to '50mg'$  to eliminate possible AR at higher dose of '75mg'$ .

## 2022-09-04 NOTE — Assessment & Plan Note (Signed)
Vit B12 deficiency, takes Vit B12, Vit B12 1010 03/02/20, Hgb 12.8 08/14/22

## 2022-09-04 NOTE — Assessment & Plan Note (Signed)
not taking statin, LDL 64 02/11/22, takes ASA 81mg qd, refused statin. 

## 2022-09-04 NOTE — Assessment & Plan Note (Addendum)
Bun/creat 23/1.18 08/14/22, 09/04/22 Na 138, K 4.4, bun 24, creat 1.27, eGFR 41, wbc 5.1, Hgb 12.2, plt 235, neutrophils 57.6. Furosemide is contributory. Observe.

## 2022-09-04 NOTE — Assessment & Plan Note (Signed)
The  patient's swelling ankles/BLE, mostly right, 08/04/22 negative DVT, BNP 51 8/31/2 Hx of grade I diastolic dysfunction 2334 per echocardiogram due to SOB, 08/18/22 EF 75%, pulmonary hypertension, mild tricuspid/mitral valve insufficiency. 08/22/22 schedule Furosemide '20mg'$ Janet [name] 54mq M+Thr

## 2022-09-04 NOTE — Progress Notes (Signed)
Location:  [place] Room Number: NO820/A Place of Service:  ALF [phone]) Provider:  Bethenny Losee X, NP  Patient Care Team: [name], MD as PCP - General (Internal Medicine) [name], MD as Referring Physician (Physical Medicine and Rehabilitation) Lajean Manes, MD as Consulting Physician (Internal Medicine)  Extended Emergency Contact Information Primary Emergency Contact: [name], [place] Phone: [phone] Relation: Daughter Secondary Emergency Contact: Myers,Lois  [place] Phone: [phone] Relation: Daughter  Code Status:  DNR Goals of care: Advanced Directive information    08/22/2022   12:01 PM  Advanced Directives  Does Patient Have a Medical Advance Directive? Yes  Type of Paramedic of [place];Living will;Out of facility DNR (pink MOST or yellow form)  Does patient want to make changes to medical advance directive? No - Patient declined  Copy of [place] in Chart? Yes - validated most recent copy scanned in chart (See row information)  Pre-existing out of facility DNR order (yellow form or pink MOST form) Pink MOST form placed in chart (order not valid for inpatient use)     Chief Complaint  Patient presents with   Acute Visit    Patient is being seen for increased agaitation    HPI:  Pt is a 88 y.o. female seen today for an acute visit for reported the patient's increased agitation, irritability. The patient was conversing upon my visit, smiled, denied pain, nausea, vomiting, cough, SOB, dysuria, urinary urgency, urinary frequency, or abd/lower back pain. She is afebrile. Pending CBC, BMP today. The patient's daughter desires to reduce Sertraline to 5m to eliminate possible AR at higher dose of 710m  The  patient's swelling ankles/BLE, mostly right, 08/04/22 negative DVT, BNP 51 8/31/2 Hx of grade I diastolic  dysfunction 202122er echocardiogram due to SOB, 08/18/22 EF 75%, pulmonary hypertension, mild tricuspid/mitral valve insufficiency. 08/22/22 schedule Furosemide 2088mcl 2m73m+Thr Hx of Hyperlipidemia, not taking statin, LDL 64 02/11/22, takes ASA 81mg18m refused statin. CKD Bun/creat 23/1.18 08/14/22 Depression, takes Mirtazapine, Sertraline, TSH 4.58 04/01/22 Vit B12 deficiency, takes Vit B12, Vit B12 1010 03/02/20, Hgb 12.8 08/14/22 Dementia, MMSE 19/30 12/22,  takes Exelon, Memantine, [facility]. underwent Neurology consultation. Suspicious and resistance for assistance at times. Dyspnea, Hx of 1/25/4/82/5003de I diastolic dysfunction 2018 7048echocardiogram due to SOB. 08/18/22 Echo EF 75%, pulmonary hypertension, mild mitral/tricuspid valves insufficiency.  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   Dyslipidemia    Fibromyalgia    Granuloma annulare    History of uterine cancer 2003   treated with hysterectomy   Lumbar radicular syndrome    left L5   Macular degeneration    left eye   Sciatica    TIA (transient ischemic attack)    Past Surgical History:  Procedure Laterality Date   ABDOMINAL HYSTERECTOMY     BREAST BIOPSY  2007   CATARACT EXTRACTION     TONSILLECTOMY      Allergies  Allergen Reactions   Aricept [Donepezil Hcl]     Muscle cramps   Latex Itching   Tramadol Other (See Comments)  Pt can't remember what side effects she had, she doesn't take it now    Outpatient Encounter Medications as of 09/04/2022  Medication Sig   aspirin EC 81 MG tablet Take 81 mg by mouth daily.   CALCIUM-VITAMIN D PO Take 1 tablet by mouth daily. 62m calcium, 800 units Vit D   furosemide (LASIX) 20 MG tablet Take 20 mg by mouth.   melatonin 3 MG TABS  tablet Take 3 mg by mouth at bedtime.   memantine (NAMENDA) 10 MG tablet TAKE 1 TABLET BY MOUTH TWICE DAILY.   mirtazapine (REMERON) 30 MG tablet TAKE ONE TABLET AT BEDTIME.   Multiple Vitamins-Minerals (CENTRUM SILVER PO) Take 2 tablets by mouth daily.    Multiple Vitamins-Minerals (PRESERVISION AREDS PO) Take 1 capsule by mouth 2 (two) times daily.   potassium chloride SA (KLOR-CON M) 20 MEQ tablet Take 20 mEq by mouth daily. On Wednesday   rivastigmine (EXELON) 1.5 MG capsule Take 1 capsule (1.5 mg total) by mouth 2 (two) times daily.   sertraline (ZOLOFT) 50 MG tablet Take 1 tablet (50 mg total) by mouth daily.   sodium fluoride (PREVIDENT 5000 PLUS) 1.1 % CREA dental cream Place 1 Application onto teeth daily.   UNABLE TO FIND Med Name: SHervey ArdThought 1 capsule twice daily   vitamin B-12 (CYANOCOBALAMIN) 500 MCG tablet Take 500 mcg by mouth daily.   No facility-administered encounter medications on file as of 09/04/2022.    Review of Systems  Constitutional:  Negative for fatigue, fever and unexpected weight change.  HENT:  Positive for hearing loss. Negative for congestion, trouble swallowing and voice change.   Eyes:  Negative for visual disturbance.  Respiratory:  Negative for cough and shortness of breath.        ?DOE  Cardiovascular:  Positive for leg swelling.  Gastrointestinal:  Negative for abdominal pain and constipation.  Genitourinary:  Negative for dysuria, frequency and urgency.  Musculoskeletal:  Positive for gait problem.  Skin:  Negative for color change.  Neurological:  Negative for speech difficulty and headaches.       Dementia  Psychiatric/Behavioral:  Negative for behavioral problems and sleep disturbance. The patient is nervous/anxious.        Resistance to assistance at times.     Immunization History  Administered Date(s) Administered   Influenza, High Dose Seasonal PF 09/23/2017, 09/28/2019   Influenza,inj,Quad PF,6+ Mos 09/16/2018    Influenza-Unspecified 09/14/2017, 09/26/2020, 10/03/2021   Moderna SARS-COV2 Booster Vaccination 10/23/2020, 05/14/2021   Moderna Sars-Covid-2 Vaccination 12/19/2019, 01/16/2020, 05/14/2021   Pneumococcal Conjugate-13 02/21/2014   Pneumococcal Polysaccharide-23 02/21/2002   Pneumococcal-Unspecified 12/15/2001   Zoster, Unspecified 12/15/2005   Pertinent  Health Maintenance Due  Topic Date Due   DEXA SCAN  Never done   INFLUENZA VACCINE  07/15/2022      05/01/2017   11:19 AM 09/21/2018    4:01 PM 12/29/2018    4:11 PM 02/08/2019    1:54 PM 03/07/2020   12:54 PM  [place] the past year? Yes No 0 0 0  Was there an injury with Fall? No  0 0   Fall Risk Category Calculator   0 0   Fall Risk Category   Low Low   Patient Fall Risk Level   Low fall risk Low fall risk   Patient at Risk for Falls Due to Impaired balance/gait       Functional Status Survey:    Vitals:   09/04/22 1152  BP: 120/62  Pulse: 81  Resp: 14  Temp: 97.7 F (36.5 C)  SpO2: 95%  Weight: 151 lb (68.5 kg)  Height: '5\' 1"'  (1.549 m)   Body mass index is 28.53 kg/m. Physical Exam Vitals and nursing note reviewed.  Constitutional:      Appearance: Normal appearance.  HENT:     Head: Normocephalic and atraumatic.     Mouth/Throat:     Mouth: Mucous membranes are moist.  Eyes:     Extraocular Movements: Extraocular movements intact.     Conjunctiva/sclera: Conjunctivae normal.     Pupils: Pupils are equal, round, and reactive to light.  Cardiovascular:     Rate and Rhythm: Normal rate and regular rhythm.     Heart sounds: No murmur heard. Pulmonary:     Effort: Pulmonary effort is normal.     Breath sounds: No rales.  Abdominal:     General: Bowel sounds are normal.     Palpations: Abdomen is soft.     Tenderness: There is no abdominal tenderness.  Musculoskeletal:     Cervical back: Normal range of motion and neck supple.     Right lower leg: Edema present.     Left lower leg: Edema  present.     Comments: Near none  Skin:    General: Skin is warm and dry.  Neurological:     General: No focal deficit present.     Mental Status: She is alert. Mental status is at baseline.     Motor: No weakness.     Coordination: Coordination normal.     Gait: Gait abnormal.     Comments: Oriented to person, place.   Psychiatric:        Mood and Affect: Mood normal.        Behavior: Behavior normal.     Comments: Upon my visit today, conversing, smiling     Labs reviewed: Recent Labs    04/02/22 0000  NA 138  K 4.4  CL 104  CO2 23*  BUN 17  CREATININE 1.2*  CALCIUM 9.2   Recent Labs    04/02/22 0000  AST 18  ALT 15  ALBUMIN 4.1   Recent Labs    04/02/22 0000  WBC 6.5  NEUTROABS 3,829.00  HGB 12.7  HCT 37  PLT 270   Lab Results  Component Value Date   TSH 4.58 04/02/2022   No results found for: "HGBA1C" Lab Results  Component Value Date   CHOL 217 (A) 03/29/2021   HDL 48 03/29/2021   LDLCALC 152 03/29/2021   TRIG 70 03/29/2021   CHOLHDL 4.7 08/01/2019    Significant Diagnostic Results in last 30 days:  No results found.  Assessment/Plan Depression, recurrent ([place]) reported the patient's increased agitation, irritability. The patient was conversing upon my visit, smiled, denied pain, nausea, vomiting, cough, SOB, dysuria, urinary urgency, urinary frequency, or abd/lower back pain. She is afebrile. Pending CBC, BMP today. The patient's daughter desires to reduce Sertraline to 30m to eliminate possible AR at higher dose of 714m   Grade I diastolic dysfunction The  patient's swelling ankles/BLE, mostly right, 08/04/22 negative DVT, BNP 51 8/31/2 Hx of grade I diastolic dysfunction 209528er echocardiogram due to SOB, 08/18/22 EF 75%, pulmonary hypertension, mild tricuspid/mitral valve insufficiency. 08/22/22 schedule Furosemide 2096mcl 64m12m+Thr  Hyperlipidemia not taking statin, LDL 64 02/11/22, takes ASA 81mg62m refused statin.  CKD (chronic  kidney disease) stage 3, GFR 30-59 ml/min (HCC) Bun/creat 23/1.18 08/14/22, 09/04/22 Na 138, K 4.4,  bun 24, creat 1.27, eGFR 41, wbc 5.1, Hgb 12.2, plt 235, neutrophils 57.6. Furosemide is contributory. Observe.   Senile dementia ([place]) MMSE 19/30 12/22,  takes Exelon, Memantine, resides [facility] for [facility], care assistance. underwent Neurology consultation. Suspicious and resistance for assistance at times.  Vitamin B12 deficiency Vit B12 deficiency, takes Vit B12, Vit B12 1010 03/02/20, Hgb 12.8 08/14/22  Dyspnea Dyspnea, Hx of 04/22/3266, Grade I diastolic dysfunction 1245 per echocardiogram due to SOB. 08/18/22 Echo EF 75%, pulmonary hypertension, mild mitral/tricuspid valves insufficiency.      Family/ staff Communication: plan of care reviewed with the patient, the patient's daughter [name], and Camera operator.  Labs/tests ordered:  pending CBC, BMP  Time spend 40 minutes.

## 2022-09-04 NOTE — Progress Notes (Signed)
This encounter was created in error - please disregard.

## 2022-09-04 NOTE — Assessment & Plan Note (Signed)
MMSE 19/30 12/22,  takes Exelon, Memantine, resides [facility] for safety, care assistance. underwent Neurology consultation. Suspicious and resistance for assistance at times.

## 2022-09-04 NOTE — Assessment & Plan Note (Signed)
Dyspnea, Hx of 0/15/9968, Grade I diastolic dysfunction 9570 per echocardiogram due to SOB. 08/18/22 Echo EF 75%, pulmonary hypertension, mild mitral/tricuspid valves insufficiency.

## 2022-09-05 ENCOUNTER — Encounter: Payer: Self-pay | Admitting: Nurse Practitioner

## 2022-09-29 ENCOUNTER — Encounter: Payer: Self-pay | Admitting: Neurology

## 2022-10-14 ENCOUNTER — Non-Acute Institutional Stay: Payer: Medicare Other | Admitting: Family Medicine

## 2022-10-14 ENCOUNTER — Encounter: Payer: Self-pay | Admitting: Family Medicine

## 2022-10-14 DIAGNOSIS — E782 Mixed hyperlipidemia: Secondary | ICD-10-CM | POA: Diagnosis not present

## 2022-10-14 DIAGNOSIS — F0390 Unspecified dementia without behavioral disturbance: Secondary | ICD-10-CM | POA: Diagnosis not present

## 2022-10-14 DIAGNOSIS — F339 Major depressive disorder, recurrent, unspecified: Secondary | ICD-10-CM | POA: Diagnosis not present

## 2022-10-14 NOTE — Progress Notes (Signed)
Provider:  Alain Honey, MD Location:      Place of Service:     PCP: Janet Dad, MD Patient Care Team: Janet Dad, MD as PCP - General (Internal Medicine) [name], MD as Referring Physician (Physical Medicine and Rehabilitation) Lajean Manes, MD as Consulting Physician (Internal Medicine)  Extended Emergency Contact Information Primary Emergency Contact: Janet [name],  [place] Phone: [phone] Relation: Daughter Secondary Emergency Contact: Janet [name]  [place] Phone: [phone] Relation: Daughter  Code Status:  Goals of Care: Advanced Directive information    08/22/2022   12:01 PM  Advanced Directives  Does Patient Have a Medical Advance Directive? Yes  Type of Paramedic of Janet [name];Living will;Out of facility DNR (pink MOST or yellow form)  Does patient want to make changes to medical advance directive? No - Patient declined  Copy of [place] in Chart? Yes - validated most recent copy scanned in chart (See row information)  Pre-existing out of facility DNR order (yellow form or pink MOST form) Pink MOST form placed in chart (order not valid for inpatient use)      No chief complaint on file.   HPI: Patient is a 88 y.o. female seen today for medical management of chronic problems including: Chronic kidney disease, senile dementia, hyperlipidemia, and depression I found her in her room today.  She is dressed very well with jewelry in place.  She wonders why I am visiting her since she has no symptoms or complaints.  Appetite is good; weight is stable.  Sleeps okay.  No issues with bowel or bladder function.  She is unable to tell me about family that visits or main live nearby.  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 She has never used smokeless tobacco. She reports that she does not drink alcohol and does not use drugs. Social History   Socioeconomic History   Marital status: Widowed    Spouse name: Not on file   Number of children: Not on file   Years of education: 16   Highest education level: Not on file  Occupational History   Occupation: Retired Warden/ranger  Tobacco Use   Smoking status: Never   Smokeless tobacco: Never  Vaping Use   Vaping Use: Never used  Substance and Sexual Activity   Alcohol use: No   Drug use: No   Sexual activity: Not on file  Other Topics Concern   Not on file  Social History Narrative   Lives at [place]   Left-handed    Caffeine: tea once  a day   Social Determinants of Health   Financial Resource Strain: Not on file  Food Insecurity: Not on file  Transportation Needs: Not on file  Physical Activity: Not on file  Stress: Not on file  Social Connections: Not on file  Intimate Partner Violence: Not on file    Functional Status Survey:    Family History  Problem Relation Age of Onset   Depression Mother    Diabetes Father    Heart attack Father    Dementia Sister    Pneumonia Brother    Breast cancer Daughter     Health Maintenance  Topic Date Due   Zoster Vaccines- Shingrix (1 of 2) 06/21/1984   DEXA SCAN  Never done   TETANUS/TDAP  06/07/2020   COVID-19 Vaccine (4 - Moderna series) 07/09/2021    INFLUENZA VACCINE  07/15/2022   Medicare Annual Wellness (AWV)  11/13/2022   Pneumonia Vaccine 83+ Years old  Completed   HPV VACCINES  Aged Out    Allergies  Allergen Reactions   Aricept [Donepezil Hcl]     Muscle cramps   Latex Itching   Tramadol Other (See Comments)    Pt can't remember what side effects she had, she doesn't take it now    Outpatient Encounter Medications as of 10/14/2022  Medication Sig   aspirin EC 81 MG tablet Take 81 mg by mouth daily.   CALCIUM-VITAMIN D PO Take 1 tablet by mouth daily. '600mg'$  calcium, 800 units Vit D   furosemide (LASIX) 20 MG tablet Take 20 mg by mouth.   melatonin 3 MG TABS tablet Take 3 mg by mouth at bedtime.   memantine (NAMENDA) 10 MG tablet TAKE 1 TABLET BY MOUTH TWICE DAILY.   mirtazapine (REMERON) 30 MG tablet TAKE ONE TABLET AT BEDTIME.   Multiple Vitamins-Minerals (CENTRUM SILVER PO) Take 2 tablets by mouth daily.    Multiple Vitamins-Minerals (PRESERVISION AREDS PO) Take 1 capsule by mouth 2 (two) times daily.   potassium chloride SA (KLOR-CON M) 20 MEQ tablet Take 20 mEq by mouth daily. On Wednesday   rivastigmine (EXELON) 1.5 MG capsule Take 1 capsule (1.5 mg total) by mouth 2 (two) times daily.   sertraline (ZOLOFT) 50 MG tablet Take 1 tablet (50 mg total) by mouth daily.   sodium fluoride (PREVIDENT 5000 PLUS) 1.1 % CREA dental cream Place 1 Application onto teeth daily.   UNABLE TO FIND Med Name: Hervey Ard Thought 1 capsule twice daily   vitamin B-12 (CYANOCOBALAMIN) 500 MCG tablet Take 500 mcg by mouth daily.   No facility-administered encounter medications on file as of 10/14/2022.    Review of Systems  Constitutional: Negative.   HENT: Negative.    Respiratory: Negative.    Cardiovascular: Negative.   Gastrointestinal: Negative.   Genitourinary: Negative.   Musculoskeletal: Negative.   Neurological: Negative.   Psychiatric/Behavioral:  Positive for agitation.   All other systems reviewed and are  negative.   There were no vitals filed for this visit. There is no height or weight on file to calculate BMI. Physical Exam Vitals and nursing note reviewed.  Constitutional:      Appearance: Normal appearance.  HENT:     Head: Normocephalic.     Mouth/Throat:     Mouth: Mucous membranes are moist.     Pharynx: Oropharynx is clear.  Eyes:     Extraocular Movements: Extraocular movements intact.     Pupils: Pupils are equal, round, and reactive to light.  Cardiovascular:  Rate and Rhythm: Normal rate and regular rhythm.     Heart sounds: Normal heart sounds.  Pulmonary:     Effort: Pulmonary effort is normal.     Breath sounds: Normal breath sounds.  Abdominal:     General: Bowel sounds are normal.     Palpations: Abdomen is soft.  Musculoskeletal:        General: Normal range of motion.     Comments: Uses a cane to help with ambulation No recent falls  Skin:    General: Skin is warm and dry.  Neurological:     General: No focal deficit present.     Mental Status: She is alert and oriented to person, place, and time.     Comments: Patient does not recall her age and is unable to tell me about her family  Psychiatric:        Mood and Affect: Mood normal.        Behavior: Behavior normal.     Comments: At my visit today and per staff she becomes a little irritated at times     Labs reviewed: Basic Metabolic Panel: Recent Labs    04/02/22 0000  NA 138  K 4.4  CL 104  CO2 23*  BUN 17  CREATININE 1.2*  CALCIUM 9.2   Liver Function Tests: Recent Labs    04/02/22 0000  AST 18  ALT 15  ALBUMIN 4.1   No results for input(s): "LIPASE", "AMYLASE" in the last 8760 hours. No results for input(s): "AMMONIA" in the last 8760 hours. CBC: Recent Labs    04/02/22 0000  WBC 6.5  NEUTROABS 3,829.00  HGB 12.7  HCT 37  PLT 270   Cardiac Enzymes: No results for input(s): "CKTOTAL", "CKMB", "CKMBINDEX", "TROPONINI" in the last 8760 hours. BNP: Invalid  input(s): "POCBNP" No results found for: "HGBA1C" Lab Results  Component Value Date   TSH 4.58 04/02/2022   Lab Results  Component Value Date   VITAMINB12 1,010 03/02/2020   No results found for: "FOLATE" No results found for: "IRON", "TIBC", "FERRITIN"  Imaging and Procedures obtained prior to SNF admission: MR BRAIN WO CONTRAST  Result Date: 11/27/2016 [facility] NEUROIMAGING REPORT STUDY DATE: 11/25/16 PATIENT NAME: Janet [name] DOB: 02-May-1932 MRN: [id number] ORDERING CLINICIAN: Kathrynn Ducking, MD CLINICAL HISTORY: 88 year old female with memory loss. EXAM: MRI brain (without) TECHNIQUE: MRI of the brain without contrast was obtained utilizing 5 mm axial slices with T1, T2, T2 flair, SWI and diffusion weighted views.  T1 sagittal and T2 coronal views were obtained. CONTRAST: no IMAGING SITE: [facility] [address] (1.5 Tesla MRI)  FINDINGS: No abnormal lesions are seen on diffusion-weighted views to suggest acute ischemia. The cortical sulci, fissures and cisterns are notable for mild diffuse, moderate perisylvian and severe mesial temporal atrophy. Lateral, third and fourth ventricle are normal in size and appearance. No extra-axial fluid collections are seen. No evidence of mass effect or midline shift.  Mild periventricular and subcortical chronic small vessel ischemic disease. Right thalamic chronic lacunar ischemic infarction. On sagittal views the posterior fossa, pituitary gland and corpus callosum are unremarkable. No evidence of intracranial hemorrhage on SWI views. The orbits and their contents, paranasal sinuses and calvarium are unremarkable.  Intracranial flow voids are present.   Abnormal MRI brain (without) demonstrating: 1. Mild diffuse, moderate perisylvian and severe mesial temporal atrophy. 2. Mild periventricular and subcortical chronic small vessel ischemic disease. 3. Right thalamic chronic lacunar ischemic infarction. 4.  No acute  findings. 5. Compared to CT from 05/21/07, the atrophy and chronic right thalamic infarct are new findings. INTERPRETING PHYSICIAN: Penni Bombard, MD Certified in Neurology, Neurophysiology and Neuroimaging [facility] [address], [place],  [address] [phone]    Assessment/Plan 1. Mixed hyperlipidemia Lipids were last assessed about 1 year ago.  Total cholesterol 217 with LDL 152  2. Depression, recurrent ([place]) Continues with sertraline 50 mg   3. Senile dementia ([place]) Seems to function well in this environment.  Current medications include memantine as well as Exelon   Family/ staff Communication:   Labs/tests ordered: per NP  Lillette Boxer. Sabra Heck, [place] [address] [place], [place] Office [phone]

## 2022-10-16 MED ORDER — MEMANTINE HCL 10 MG PO TABS: 5.0000 mg | ORAL_TABLET | Freq: Two times a day (BID) | ORAL | 2 refills | Status: DC

## 2022-11-13 ENCOUNTER — Ambulatory Visit (INDEPENDENT_AMBULATORY_CARE_PROVIDER_SITE_OTHER): Payer: Medicare Other | Admitting: Adult Health

## 2022-11-13 ENCOUNTER — Encounter: Payer: Self-pay | Admitting: Adult Health

## 2022-11-13 ENCOUNTER — Ambulatory Visit: Payer: Medicare Other | Admitting: Neurology

## 2022-11-13 VITALS — BP 140/73 | HR 87 | Ht 61.0 in | Wt 147.0 lb

## 2022-11-13 DIAGNOSIS — F02B3 Dementia in other diseases classified elsewhere, moderate, with mood disturbance: Secondary | ICD-10-CM | POA: Diagnosis not present

## 2022-11-13 DIAGNOSIS — G301 Alzheimer's disease with late onset: Secondary | ICD-10-CM

## 2022-11-13 NOTE — Progress Notes (Signed)
[facility] [address] [place]. [place] [address] [phone]       OFFICE FOLLOW UP NOTE  Janet [name] Date of Birth:  [birth year] Medical Record Number:  [id number]   Reason for visit: Alzheimer's dementia    SUBJECTIVE:   CHIEF COMPLAINT:  Chief Complaint  Patient presents with   Follow-up    RM 2 with daughter beth  Pt is well and states memory is stable, daughter reports it has worsen since last visit     HPI:   Update 11/13/2022 JM: Patient returns for Alzheimer's dementia follow-up accompanied by her daughter.  She was previously seen by Dr. Brett Fairy almost 1 year ago.  She continues to reside at [facility].  Patient believes memory has been stable since prior visit but daughter reports it has declined, has good days and bad days.  She has remained on Namenda and low-dose Exelon.  Namenda dosage decreased last month currently taking 5 mg twice daily due to concern of bilateral leg swelling, no significant change in swelling since dosage change but also has not noticed any significant changes to her memory.  Does have chronic gait disorder and prior diagnosis of dropped head syndrome which has been generally stable since prior visit.  Use of cane at all times.  Does ambulate 3 times daily to dining area, denies any recent falls.     Update 11-19-2021 Dr. Brett Fairy:  Janet [name] is an 88 year old left-handed white female with a history of a progressive memory disturbance.  The patient is on Namenda and low-dose Exelon, she cannot tolerate doses greater than 1.5 mg twice daily of the Exelon.  The patient resided at [facility] and moved to assisted living in 2021.,She is socially active, she is now involved in tai chi classes, she seems to enjoy her life there.  The patient has not had any falls, she uses a cane consistently when she is outside of her room.  The patient is eating well, she has developed some swelling in the legs that seems to go down  overnight.  The patient previously has had some problems with low sodium levels. Her diet is low salt, not by choice , but because that's what is served at her senior residence.  She has a decreased creatinine clearance , CKD 2.      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   Dyslipidemia    Fibromyalgia    Granuloma annulare    History of uterine cancer 2003   treated with hysterectomy   Lumbar radicular syndrome    left L5   Macular degeneration    left eye   Sciatica    TIA (transient ischemic attack)     PSH:  Past Surgical History:  Procedure Laterality Date   ABDOMINAL HYSTERECTOMY     BREAST BIOPSY  2007   CATARACT EXTRACTION     TONSILLECTOMY      Social History:  Social History   Socioeconomic History   Marital status: Widowed    Spouse name: Not on file   Number of children: Not on file   Years  of education: 16   Highest education level: Not on file  Occupational History   Occupation: Retired Warden/ranger  Tobacco Use   Smoking status: Never   Smokeless tobacco: Never  Vaping Use   Vaping Use: Never used  Substance and Sexual Activity   Alcohol use: No   Drug use: No   Sexual activity: Not on file  Other Topics Concern   Not on file  Social History Narrative   Lives at [place]   Left-handed    Caffeine: tea once a day   Social Determinants of Health   Financial Resource Strain: Not on file  Food Insecurity: Not on file  Transportation Needs: Not on file  Physical Activity: Not on file  Stress: Not on file  Social Connections: Not on file  Intimate Partner Violence: Not on file    Family  History:  Family History  Problem Relation Age of Onset   Depression Mother    Diabetes Father    Heart attack Father    Dementia Sister    Pneumonia Brother    Breast cancer Daughter     Medications:   Current Outpatient Medications on File Prior to Visit  Medication Sig Dispense Refill   aspirin EC 81 MG tablet Take 81 mg by mouth daily.     CALCIUM-VITAMIN D PO Take 1 tablet by mouth daily. 664m calcium, 800 units Vit D     furosemide (LASIX) 20 MG tablet Take 20 mg by mouth.     melatonin 3 MG TABS tablet Take 3 mg by mouth at bedtime.     memantine (NAMENDA) 10 MG tablet Take 0.5 tablets (5 mg total) by mouth 2 (two) times daily. 90 tablet 2   memantine (NAMENDA) 5 MG tablet Take by mouth.     mirtazapine (REMERON) 30 MG tablet TAKE ONE TABLET AT BEDTIME. 90 tablet 0   Multiple Vitamins-Minerals (PRESERVISION AREDS PO) Take 1 capsule by mouth 2 (two) times daily.     potassium chloride SA (KLOR-CON M) 20 MEQ tablet Take 20 mEq by mouth daily. On Wednesday     rivastigmine (EXELON) 1.5 MG capsule Take 1 capsule (1.5 mg total) by mouth 2 (two) times daily. 180 capsule 3   sertraline (ZOLOFT) 50 MG tablet Take 1 tablet (50 mg total) by mouth daily. 30 tablet 3   sodium fluoride (PREVIDENT 5000 PLUS) 1.1 % CREA dental cream Place 1 Application onto teeth daily.     UNABLE TO FIND Med Name: SHervey ArdThought 1 capsule twice daily     vitamin B-12 (CYANOCOBALAMIN) 500 MCG tablet Take 500 mcg by mouth daily.     No current facility-administered medications on file prior to visit.    Allergies:   Allergies  Allergen Reactions   Aricept [Donepezil Hcl]     Muscle cramps   Latex Itching   Tramadol Other (See Comments)    Pt can't remember what side effects she had, she doesn't take it now      OBJECTIVE:  Physical Exam  Vitals:   11/13/22 1452  BP: (!) 140/73  Pulse: 87  Weight: 147 lb (66.7 kg)  Height: 5' 1" (1.549 m)   Body mass index is 27.78 kg/m. No results  found.   General: well developed, well nourished, very pleasant elderly Caucasian female, seated, in no evident distress Head: head normocephalic and atraumatic.   Neck: supple with no carotid or supraclavicular bruits Cardiovascular: regular rate and rhythm, no murmurs Musculoskeletal:  drop head noted (chronic) Skin:  no rash/petichiae Vascular:  Normal pulses all extremities   Neurologic Exam Mental Status: Awake and fully alert. Recent memory impaired and remote memory intact. Attention span, concentration and fund of knowledge impaired. Mood and affect appropriate.     11/13/2022    3:08 PM 11/19/2021    2:13 PM 05/09/2021    3:26 PM  MMSE - Mini Mental State Exam  Orientation to time 1 3 0  Orientation to Place _0 Registration _1 Attention/ Calculation 1 0 1  Recall 0 0 0  Language- name 2 objects _2 Language- repeat 1 1 0  Language- follow 3 step command _3 Language- read & follow direction _4 Write a sentence _5 Copy design _6 Total score _7 Cranial Nerves: Pupils equal, briskly reactive to light. Extraocular movements full without nystagmus. Visual fields full to confrontation. Hearing intact. Facial sensation intact. Face, tongue, palate moves normally and symmetrically.  Motor: Normal bulk and tone. Normal strength in all tested extremity muscles Sensory.: intact to touch , pinprick , position and vibratory sensation.  Coordination: Rapid alternating movements normal in all extremities. Finger-to-nose and heel-to-shin performed accurately bilaterally. Reflexes: 1+ and symmetric. Toes downgoing.         ASSESSMENT/PLAN: Janet [name] is a 88 y.o. year old female    1.  Alzheimer's dementia  -MMSE today 16/30 (prior 19/30)  -Continue memantine 5 mg twice daily, low suspicion memantine was contributing to BLE swelling but will continue current dosage for now as cognition stable  -Continue rivastigmine 1.5 mg twice daily  2.  Chronic gait impairment  -stable since prior visit but did not fully discuss d/t time constraints as patient late to appointment  -advised use of cane at all times  -advised to call office if they wish to participate in additional PT sessions    Follow up in 6 months or call earlier if needed    CC:  PCP: [name], MD    I spent 24 minutes of face-to-face and non-face-to-face time with patient and daughter.  This included previsit chart review, lab review, study review, order entry, electronic health record documentation, patient and daughter education regarding above diagnoses and treatment plan and answered all the questions to patient's satisfaction   Frann Rider, [facility]  [name] [address] [place],  [address]  Phone [phone] Fax [phone] Note: This document was prepared with digital dictation and possible smart phrase technology. Any transcriptional errors that result from this process are unintentional.

## 2022-11-13 NOTE — Patient Instructions (Addendum)
Continue Namenda 5 mg twice daily and Exelon 1.5 mg twice daily  Please let us know if you would like to do any additional therapy prior to next visit     Follow-up in 6 months or call earlier if needed

## 2022-11-26 ENCOUNTER — Encounter: Payer: Self-pay | Admitting: Adult Health

## 2022-11-26 ENCOUNTER — Non-Acute Institutional Stay: Payer: Medicare Other | Admitting: Adult Health

## 2022-11-26 DIAGNOSIS — M19041 Primary osteoarthritis, right hand: Secondary | ICD-10-CM

## 2022-11-26 DIAGNOSIS — F0390 Unspecified dementia without behavioral disturbance: Secondary | ICD-10-CM | POA: Diagnosis not present

## 2022-11-26 NOTE — Progress Notes (Unsigned)
Location:   [place] Room Number: 802 Place of Service:  ALF [phone]) Provider:  Durenda Age, NP  Mast, Man X, NP  Patient Care Team: Mast, Man X, NP as PCP - General (Internal Medicine) [name], MD as Referring Physician (Physical Medicine and Rehabilitation) Lajean Manes, MD as Consulting Physician (Internal Medicine)  Extended Emergency Contact Information Primary Emergency Contact: [name], [place] Phone: [phone] Relation: Daughter Secondary Emergency Contact: Myers,Lois  [place] Phone: [phone] Relation: Daughter  Code Status:    Goals of care: Advanced Directive information    11/26/2022    9:25 AM  Advanced Directives  Does Patient Have a Medical Advance Directive? Yes  Type of Paramedic of [place];Living will;Out of facility DNR (pink MOST or yellow form)  Does patient want to make changes to medical advance directive? No - Patient declined  Copy of [place] in Chart? Yes - validated most recent copy scanned in chart (See row information)  Pre-existing out of facility DNR order (yellow form or pink MOST form) Pink MOST form placed in chart (order not valid for inpatient use)     Chief Complaint  Patient presents with   Acute Visit    Right 4th finger bent and pink    HPI:  Pt is a 88 y.o. female seen today for an acute visit for bent 4th finger and pinkish. No reported trauma. She denies tenderness on the 4th finger and able to move it. No edema noted.  X-ray done today showed no acute bone abnormality. Mild degenerative changes and osteopenia were noted.   She has a PMH of dementia, hyperlipidemia and depression.   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   Dyslipidemia    Fibromyalgia    Granuloma annulare    History of uterine cancer 2003   treated with hysterectomy   Lumbar radicular syndrome    left L5   Macular degeneration    left eye   Sciatica    TIA (transient ischemic attack)    Past Surgical History:  Procedure Laterality Date   ABDOMINAL HYSTERECTOMY     BREAST BIOPSY  2007   CATARACT EXTRACTION     TONSILLECTOMY      Allergies  Allergen Reactions   Aricept [Donepezil Hcl]     Muscle cramps   Latex Itching   Tramadol Other (See Comments)    Pt can't remember what side effects she had, she doesn't take it now    Allergies as of 11/26/2022       Reactions   Aricept [donepezil Hcl]    Muscle cramps   Latex Itching   Tramadol Other (See Comments)   Pt can't remember what side effects she had, she doesn't take it now        Medication List        Accurate as of November 26, 2022  9:37 AM. If you have any questions, ask your nurse or doctor.          STOP taking these medications    UNABLE TO FIND Stopped by: Durenda Age, NP  TAKE these medications    aspirin EC 81 MG tablet Take 81 mg by mouth daily.   CALCIUM-VITAMIN D PO Take 1 tablet by mouth daily. '600mg'$  calcium, 800 units Vit D   furosemide 20 MG tablet Commonly known as: LASIX Take 20 mg by mouth. Every Monday and Thursday   melatonin 3 MG Tabs tablet Take 3 mg by mouth at bedtime.   memantine 5 MG tablet Commonly known as: NAMENDA Take by mouth 2 (two) times daily. What changed: Another medication with the same name was removed. Continue taking this medication, and follow the directions you see here. Changed by: Durenda Age, NP   mirtazapine 30 MG tablet Commonly known as: REMERON TAKE ONE TABLET AT BEDTIME.   potassium chloride SA 20 MEQ tablet Commonly known as: KLOR-CON M Take 20 mEq by mouth daily. Monday and Thursday    PRESERVISION AREDS PO Take 1 capsule by mouth 2 (two) times daily.   [facility] Senior Tabs Take 2 tablets by mouth daily.   rivastigmine 1.5 MG capsule Commonly known as: EXELON Take 1 capsule (1.5 mg total) by mouth 2 (two) times daily.   sertraline 50 MG tablet Commonly known as: ZOLOFT Take 1 tablet (50 mg total) by mouth daily.   sodium fluoride 1.1 % Crea dental cream Commonly known as: PREVIDENT 5000 PLUS Place 1 Application onto teeth daily.   UNABLE TO FIND 1 capsule 2 (two) times daily. Sharp thoughts   vitamin B-12 500 MCG tablet Commonly known as: CYANOCOBALAMIN Take 500 mcg by mouth daily.        Review of Systems  Unable to obtain due to dementia.  Immunization History  Administered Date(s) Administered   Influenza, High Dose Seasonal PF 09/23/2017, 09/28/2019   Influenza,inj,Quad PF,6+ Mos 09/16/2018   Influenza-Unspecified 09/14/2017, 09/26/2020, 10/03/2021   Moderna SARS-COV2 Booster Vaccination 10/23/2020, 05/14/2021   Moderna Sars-Covid-2 Vaccination 12/19/2019, 01/16/2020, 05/14/2021   Pneumococcal Conjugate-13 02/21/2014   Pneumococcal Polysaccharide-23 02/21/2002   Pneumococcal-Unspecified 12/15/2001   Zoster, Unspecified 12/15/2005   Pertinent  Health Maintenance Due  Topic Date Due   DEXA SCAN  Never done   INFLUENZA VACCINE  07/15/2022      05/01/2017   11:19 AM 09/21/2018    4:01 PM 12/29/2018    4:11 PM 02/08/2019    1:54 PM 03/07/2020   12:54 PM  [place] in the past year? Yes No 0 0 0  Was there an injury with Fall? No  0 0   Fall Risk Category Calculator   0 0   Fall Risk Category   Low Low   Patient Fall Risk Level   Low fall risk Low fall risk   Patient at Risk for Falls Due to Impaired balance/gait       Functional Status Survey:    Vitals:   11/26/22 0923  BP: 132/79  Pulse: 81  Resp: 19  Temp: (!) 97.1 F (36.2 C)  SpO2: 94%  Weight: 150 lb (68 kg)  Height: '5\' 1"'$  (1.549 m)   Body mass index is 28.34  kg/m. Physical Exam Constitutional:      General: She is not in acute distress.    Appearance: Normal appearance.  HENT:     Head: Normocephalic and atraumatic.     Nose: Nose normal.     Mouth/Throat:     Mouth: Mucous membranes are moist.  Eyes:     Conjunctiva/sclera: Conjunctivae normal.  Cardiovascular:     Rate and Rhythm: Normal rate  and regular rhythm.  Pulmonary:     Effort: Pulmonary effort is normal.     Breath sounds: Normal breath sounds.  Abdominal:     General: Bowel sounds are normal.     Palpations: Abdomen is soft.  Musculoskeletal:        General: Normal range of motion.     Cervical back: Normal range of motion.     Comments: Right 4 th finger DIP bent  Skin:    General: Skin is warm and dry.  Neurological:     General: No focal deficit present.     Mental Status: She is alert and oriented to person, place, and time.  Psychiatric:        Mood and Affect: Mood normal.        Behavior: Behavior normal.        Thought Content: Thought content normal.        Judgment: Judgment normal.     Labs reviewed: Recent Labs    04/02/22 0000  NA 138  K 4.4  CL 104  CO2 23*  BUN 17  CREATININE 1.2*  CALCIUM 9.2   Recent Labs    04/02/22 0000  AST 18  ALT 15  ALBUMIN 4.1   Recent Labs    04/02/22 0000  WBC 6.5  NEUTROABS 3,829.00  HGB 12.7  HCT 37  PLT 270   Lab Results  Component Value Date   TSH 4.58 04/02/2022   No results found for: "HGBA1C" Lab Results  Component Value Date   CHOL 217 (A) 03/29/2021   HDL 48 03/29/2021   LDLCALC 152 03/29/2021   TRIG 70 03/29/2021   CHOLHDL 4.7 08/01/2019    Significant Diagnostic Results in last 30 days:  No results found.  Assessment/Plan  1. Osteoarthritis of finger of right hand -  crooked 4th finger DIP thought due to degenerative changes -  denies pain -   encouraged to do hand exercises to maintain hand function and muscle strength  2. Senile dementia ([place]) -  BIMS score 3/15,  ranging in severe cognitive impairment -  continue Namenda and Exelon -  continue supportive care  Family/ staff Communication:  Discussed plan of care with charge nurse.  Labs/tests ordered:   x-ray of right hand

## 2022-12-01 ENCOUNTER — Non-Acute Institutional Stay: Payer: Medicare Other | Admitting: Nurse Practitioner

## 2022-12-01 ENCOUNTER — Encounter: Payer: Self-pay | Admitting: Nurse Practitioner

## 2022-12-01 DIAGNOSIS — N1832 Chronic kidney disease, stage 3b: Secondary | ICD-10-CM | POA: Diagnosis not present

## 2022-12-01 DIAGNOSIS — F02B3 Dementia in other diseases classified elsewhere, moderate, with mood disturbance: Secondary | ICD-10-CM

## 2022-12-01 DIAGNOSIS — F339 Major depressive disorder, recurrent, unspecified: Secondary | ICD-10-CM

## 2022-12-01 DIAGNOSIS — I5189 Other ill-defined heart diseases: Secondary | ICD-10-CM | POA: Diagnosis not present

## 2022-12-01 DIAGNOSIS — E782 Mixed hyperlipidemia: Secondary | ICD-10-CM

## 2022-12-01 DIAGNOSIS — G301 Alzheimer's disease with late onset: Secondary | ICD-10-CM

## 2022-12-01 DIAGNOSIS — E538 Deficiency of other specified B group vitamins: Secondary | ICD-10-CM | POA: Diagnosis not present

## 2022-12-01 NOTE — Assessment & Plan Note (Signed)
not taking statin, LDL 64 02/11/22, takes ASA '81mg'$  qd, refused statin.

## 2022-12-01 NOTE — Assessment & Plan Note (Signed)
takes Vit B12, Vit B12 1010 03/02/20, Hgb 12.2 09/04/22

## 2022-12-01 NOTE — Assessment & Plan Note (Signed)
MMSE 19/30 12/22,  takes Exelon, Memantine, resides [facility] for safety, care assistance. underwent Neurology consultation.

## 2022-12-01 NOTE — Progress Notes (Unsigned)
Location:   [facility] Room Number: [place] of Service:  ALF (13) Provider:  Aundrey Elahi Darlina Rumpf, NP  Hutch Rhett X, NP  Patient Care Team: Jeret Goyer X, NP as PCP - General (Internal Medicine) [name], MD as Referring Physician (Physical Medicine and Rehabilitation) Lajean Manes, MD as Consulting Physician (Internal Medicine)  Extended Emergency Contact Information Primary Emergency Contact: [name], Charlo [place] of [place] Phone: [phone] Relation: Daughter Secondary Emergency Contact: Myers,Lois  [place] Phone: [phone] Relation: Daughter  Code Status:  DNR Goals of care: Advanced Directive information    12/01/2022    9:57 AM  Advanced Directives  Does Patient Have a Medical Advance Directive? Yes  Type of Paramedic of [place];Living will;Out of facility DNR (pink MOST or yellow form)  Does patient want to make changes to medical advance directive? No - Patient declined  Copy of [place] in Chart? Yes - validated most recent copy scanned in chart (See row information)  Pre-existing out of facility DNR order (yellow form or pink MOST form) Pink MOST form placed in chart (order not valid for inpatient use)     Chief Complaint  Patient presents with   Medical Management of Chronic Issues    Routine follow up visit   Immunizations    Tdap,shingrix and flu vaccine due   Quality Metric Gaps    Dexa scan    HPI:  Pt is a 88 y.o. female seen today for medical management of chronic diseases.    Depression, stable, on Mirtazapine, Sertraline.  The  patient's swelling ankles/BLE, mostly right, 08/04/22 negative DVT, BNP 51 8/31/2 Hx of grade I diastolic dysfunction 9528 per echocardiogram due to SOB, 08/18/22 EF 75%, pulmonary hypertension, mild tricuspid/mitral valve insufficiency. 08/22/22 schedule Furosemide '20mg'$ /Kcl 63mq M+Thr Hx of  Hyperlipidemia, not taking statin, LDL 64 02/11/22, takes ASA '81mg'$  qd, refused statin. CKD Bun/creat 24/1.27 09/04/22 Vit B12 deficiency, takes Vit B12, Vit B12 1010 03/02/20, Hgb 12.2 09/04/22 Dementia, MMSE 19/30 12/22,  takes Exelon, Memantine, [facility] underwent Neurology consultation. Dyspnea, Hx of 14/13/2440 Grade I diastolic dysfunction 21027per echocardiogram due to SOB. 08/18/22 Echo EF 75%, pulmonary hypertension, mild mitral/tricuspid valves insufficiency.    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   Dyslipidemia    Fibromyalgia    Granuloma annulare    History of uterine cancer 2003   treated with hysterectomy   Lumbar radicular syndrome    left L5   Macular degeneration    left eye   Sciatica    TIA (transient ischemic attack)    Past Surgical History:  Procedure Laterality Date   ABDOMINAL HYSTERECTOMY     BREAST BIOPSY  2007   CATARACT EXTRACTION     TONSILLECTOMY      Allergies  Allergen Reactions   Aricept [Donepezil Hcl]     Muscle cramps   Latex Itching   Tramadol Other (See Comments)    Pt can't remember what side effects she had, she doesn't take it now    Allergies as of 12/01/2022  Reactions   Aricept [donepezil Hcl]    Muscle cramps   Latex Itching   Tramadol Other (See Comments)   Pt can't remember what side effects she had, she doesn't take it now        Medication List        Accurate as of December 01, 2022 11:59 PM. If you have any questions, ask your nurse or doctor.          aspirin EC 81 MG tablet Take 81 mg by mouth daily.   CALCIUM-VITAMIN D PO Take 1 tablet by mouth daily. '600mg'$  calcium, 800 units Vit D   furosemide 20 MG tablet Commonly known as: LASIX Take 20 mg by mouth.  Every Monday and Thursday   melatonin 3 MG Tabs tablet Take 3 mg by mouth at bedtime.   memantine 5 MG tablet Commonly known as: NAMENDA Take by mouth 2 (two) times daily.   mirtazapine 30 MG tablet Commonly known as: REMERON TAKE ONE TABLET AT BEDTIME.   potassium chloride SA 20 MEQ tablet Commonly known as: KLOR-CON M Take 20 mEq by mouth daily. Monday and Thursday   PRESERVISION AREDS PO Take 1 capsule by mouth 2 (two) times daily.   Sentry Senior Tabs Take 2 tablets by mouth daily.   rivastigmine 1.5 MG capsule Commonly known as: EXELON Take 1 capsule (1.5 mg total) by mouth 2 (two) times daily.   sertraline 50 MG tablet Commonly known as: ZOLOFT Take 1 tablet (50 mg total) by mouth daily.   sodium fluoride 1.1 % Crea dental cream Commonly known as: PREVIDENT 5000 PLUS Place 1 Application onto teeth daily.   UNABLE TO FIND 1 capsule 2 (two) times daily. Sharp thoughts   vitamin B-12 500 MCG tablet Commonly known as: CYANOCOBALAMIN Take 500 mcg by mouth daily.        Review of Systems  Constitutional:  Negative for fatigue, fever and unexpected weight change.  HENT:  Positive for hearing loss. Negative for congestion, trouble swallowing and voice change.   Eyes:  Negative for visual disturbance.  Respiratory:  Negative for cough and shortness of breath.        ?DOE  Cardiovascular:  Positive for leg swelling.  Gastrointestinal:  Negative for abdominal pain and constipation.  Genitourinary:  Negative for dysuria, frequency and urgency.  Musculoskeletal:  Positive for gait problem.  Skin:  Negative for color change.  Neurological:  Negative for speech difficulty and headaches.       Dementia  Psychiatric/Behavioral:  Negative for behavioral problems and sleep disturbance. The patient is nervous/anxious.        Resistance to assistance at times.     Immunization History  Administered Date(s) Administered   Influenza, High Dose Seasonal PF 09/23/2017,  09/28/2019   Influenza,inj,Quad PF,6+ Mos 09/16/2018   Influenza-Unspecified 09/14/2017, 09/26/2020, 10/03/2021, 10/06/2022   Moderna SARS-COV2 Booster Vaccination 10/23/2020, 05/14/2021   Moderna Sars-Covid-2 Vaccination 12/19/2019, 01/16/2020, 05/14/2021   Pneumococcal Conjugate-13 02/21/2014   Pneumococcal Polysaccharide-23 02/21/2002   Pneumococcal-Unspecified 12/15/2001   Unspecified SARS-COV-2 Vaccination 10/16/2022   Zoster, Unspecified 12/15/2005   Pertinent  Health Maintenance Due  Topic Date Due   DEXA SCAN  Never done   INFLUENZA VACCINE  Completed      05/01/2017   11:19 AM 09/21/2018    4:01 PM 12/29/2018    4:11 PM 02/08/2019    1:54 PM 03/07/2020   12:54 PM  [place] in the past year? Yes No 0 0 0  Was there an injury with Fall? No  0 0   Fall Risk Category Calculator   0 0   Fall Risk Category   Low Low   Patient Fall Risk Level   Low fall risk Low fall risk   Patient at Risk for Falls Due to Impaired balance/gait       Functional Status Survey:    Vitals:   12/01/22 0956  BP: (!) 146/68  Pulse: 89  Resp: 18  Temp: 98.1 F (36.7 C)  SpO2: 94%  Weight: 150 lb (68 kg)  Height: '5\' 1"'$  (1.549 m)   Body mass index is 28.34 kg/m. Physical Exam Vitals and nursing note reviewed.  Constitutional:      Appearance: Normal appearance.  HENT:     Head: Normocephalic and atraumatic.     Mouth/Throat:     Mouth: Mucous membranes are moist.  Eyes:     Extraocular Movements: Extraocular movements intact.     Conjunctiva/sclera: Conjunctivae normal.     Pupils: Pupils are equal, round, and reactive to light.  Cardiovascular:     Rate and Rhythm: Normal rate and regular rhythm.     Heart sounds: No murmur heard. Pulmonary:     Effort: Pulmonary effort is normal.     Breath sounds: No rales.  Abdominal:     General: Bowel sounds are normal.     Palpations: Abdomen is soft.     Tenderness: There is no abdominal tenderness.  Musculoskeletal:      Cervical back: Normal range of motion and neck supple.     Right lower leg: Edema present.     Left lower leg: Edema present.     Comments: Near none  Skin:    General: Skin is warm and dry.  Neurological:     General: No focal deficit present.     Mental Status: She is alert. Mental status is at baseline.     Motor: No weakness.     Coordination: Coordination normal.     Gait: Gait abnormal.     Comments: Oriented to person, place.   Psychiatric:        Mood and Affect: Mood normal.        Behavior: Behavior normal.     Comments: Upon my visit today, conversing, smiling     Labs reviewed: Recent Labs    04/02/22 0000  NA 138  K 4.4  CL 104  CO2 23*  BUN 17  CREATININE 1.2*  CALCIUM 9.2   Recent Labs    04/02/22 0000  AST 18  ALT 15  ALBUMIN 4.1   Recent Labs    04/02/22 0000  WBC 6.5  NEUTROABS 3,829.00  HGB 12.7  HCT 37  PLT 270   Lab Results  Component Value Date   TSH 4.58 04/02/2022   No results found for: "HGBA1C" Lab Results  Component Value Date   CHOL 217 (A) 03/29/2021   HDL 48 03/29/2021   LDLCALC 152 03/29/2021   TRIG 70 03/29/2021   CHOLHDL 4.7 08/01/2019    Significant Diagnostic Results in last 30 days:  No results found.  Assessment/Plan Grade I diastolic dysfunction The  patient's swelling ankles/BLE, mostly right, 08/04/22 negative DVT, BNP 51 8/31/2 Hx of grade I diastolic dysfunction 9528 per echocardiogram due to SOB, 08/18/22 EF 75%, pulmonary hypertension, mild tricuspid/mitral valve insufficiency. 08/22/22 schedule Furosemide '20mg'$ /Kcl 23mq M+Thr Dyspnea, Hx of 14/13/2440 Grade I diastolic dysfunction 21027per echocardiogram due to SOB. 08/18/22 Echo EF  75%, pulmonary hypertension, mild mitral/tricuspid valves insufficiency.    Hyperlipidemia not taking statin, LDL 64 02/11/22, takes ASA '81mg'$  qd, refused statin.  CKD (chronic kidney disease) stage 3, GFR 30-59 ml/min (HCC) Bun/creat 24/1.27 09/04/22  Vitamin B12  deficiency takes Vit B12, Vit B12 1010 03/02/20, Hgb 12.2 09/04/22  Moderate late onset Alzheimer's dementia with mood disturbance ([place]) MMSE 19/30 12/22,  takes Exelon, Memantine, resides [facility] for safety, care assistance. underwent Neurology consultation.  Depression, recurrent ([place]) stable, on Mirtazapine, Sertraline.   Due DEXA, Tdap, Shingrix, Flu vaccine-if HOPA desires, then make plans.    Family/ staff Communication: plan of care reviewed with the patient and charge nurse.   Labs/tests ordered:  none  Time spend 40 minutes.

## 2022-12-01 NOTE — Assessment & Plan Note (Signed)
stable, on Mirtazapine, Sertraline.

## 2022-12-01 NOTE — Assessment & Plan Note (Addendum)
The  patient's swelling ankles/BLE, mostly right, 08/04/22 negative DVT, BNP 51 8/31/2 Hx of grade I diastolic dysfunction 6389 per echocardiogram due to SOB, 08/18/22 EF 75%, pulmonary hypertension, mild tricuspid/mitral valve insufficiency. 08/22/22 schedule Furosemide '20mg'$ /Kcl 10mq M+Thr Dyspnea, Hx of 13/73/4287 Grade I diastolic dysfunction 26811per echocardiogram due to SOB. 08/18/22 Echo EF 75%, pulmonary hypertension, mild mitral/tricuspid valves insufficiency.

## 2022-12-01 NOTE — Assessment & Plan Note (Signed)
Bun/creat 24/1.27 09/04/22

## 2022-12-30 ENCOUNTER — Encounter: Payer: Self-pay | Admitting: Nurse Practitioner

## 2022-12-30 ENCOUNTER — Non-Acute Institutional Stay: Payer: Medicare PPO | Admitting: Nurse Practitioner

## 2022-12-30 DIAGNOSIS — I5189 Other ill-defined heart diseases: Secondary | ICD-10-CM | POA: Diagnosis not present

## 2022-12-30 DIAGNOSIS — F339 Major depressive disorder, recurrent, unspecified: Secondary | ICD-10-CM

## 2022-12-30 DIAGNOSIS — F0390 Unspecified dementia without behavioral disturbance: Secondary | ICD-10-CM

## 2022-12-30 DIAGNOSIS — E538 Deficiency of other specified B group vitamins: Secondary | ICD-10-CM

## 2022-12-30 DIAGNOSIS — M8580 Other specified disorders of bone density and structure, unspecified site: Secondary | ICD-10-CM

## 2022-12-30 DIAGNOSIS — N1832 Chronic kidney disease, stage 3b: Secondary | ICD-10-CM | POA: Diagnosis not present

## 2022-12-30 DIAGNOSIS — E782 Mixed hyperlipidemia: Secondary | ICD-10-CM | POA: Diagnosis not present

## 2022-12-30 DIAGNOSIS — Z780 Asymptomatic menopausal state: Secondary | ICD-10-CM

## 2022-12-30 NOTE — Assessment & Plan Note (Signed)
MMSE 19/30 12/22,  takes Exelon, Memantine, resides [facility] for safety, care assistance. underwent Neurology consultation.

## 2022-12-30 NOTE — Assessment & Plan Note (Signed)
Bun/creat 24/1.27 09/04/22

## 2022-12-30 NOTE — Assessment & Plan Note (Signed)
Vit B12 deficiency, takes Vit B12, Vit B12 1010 03/02/20, Hgb 12.2 09/04/22

## 2022-12-30 NOTE — Assessment & Plan Note (Signed)
stable, on Mirtazapine, Sertraline

## 2022-12-30 NOTE — Assessment & Plan Note (Signed)
Hx of grade I diastolic dysfunction 8592 per echocardiogram due to SOB, 08/18/22 EF 75%, pulmonary hypertension, mild tricuspid/mitral valve insufficiency. 08/22/22 schedule Furosemide '20mg'$ /Kcl 73mq M+Thr, weight stable.  The  patient's swelling ankles/BLE, mostly right, 08/04/22 negative DVT, BNP 51 8/31/2 Will increase Furosemide '20mg'$ /Kcl 269m po MWF, repeat BMP one week.

## 2022-12-30 NOTE — Assessment & Plan Note (Signed)
not taking statin, LDL 64 02/11/22, takes ASA '81mg'$  qd, refused statin.

## 2022-12-30 NOTE — Progress Notes (Signed)
Location:   [facility] Room Number: 789-F Place of Service:  ALF (13) Provider: Lennie Odor Zubair Lofton NP  Romin Divita X, NP  Patient Care Team: Milen Lengacher X, NP as PCP - General (Internal Medicine) Janet Apple, MD as Referring Physician (Physical Medicine and Rehabilitation) Lajean Manes, MD as Consulting Physician (Internal Medicine)  Extended Emergency Contact Information Primary Emergency Contact: Janet [name], Janet [name] of [place] Phone: [phone] Relation: Daughter Secondary Emergency Contact: [name],Janet  [place] Phone: [phone] Relation: Daughter  Code Status: DNR Goals of care: Advanced Directive information    12/01/2022    9:57 AM  Advanced Directives  Does Patient Have a Medical Advance Directive? Yes  Type of Paramedic of [place];Living will;Out of facility DNR (pink MOST or yellow form)  Does patient want to make changes to medical advance directive? No - Patient declined  Copy of [place] in Chart? Yes - validated most recent copy scanned in chart (See row information)  Pre-existing out of facility DNR order (yellow form or pink MOST form) Pink MOST form placed in chart (order not valid for inpatient use)     Chief Complaint  Patient presents with   Acute Visit    Edema in leg    HPI:  Pt is a 89 y.o. female seen today for an acute visit for leg swelling.  The  patient's swelling ankles/BLE, mostly right, 08/04/22 negative DVT, BNP 51 8/31/2 Hx of grade I diastolic dysfunction 5361 per echocardiogram due to SOB, 08/18/22 EF 75%, pulmonary hypertension, mild tricuspid/mitral valve insufficiency. 08/22/22 schedule Furosemide '20mg'$ /Kcl 34mq M+Thr, weight stable.  stable, on Mirtazapine, Sertraline Hx of Hyperlipidemia, not taking statin, LDL 64 02/11/22, takes ASA '81mg'$  qd, refused statin. CKD Bun/creat 24/1.27 09/04/22 Vit B12 deficiency, takes Vit B12, Vit  B12 1010 03/02/20, Hgb 12.2 09/04/22 Dementia, MMSE 19/30 12/22,  takes Exelon, Memantine, [facility] underwent Neurology consultation. Dyspnea, Hx of 14/43/1540 Grade I diastolic dysfunction 20867per echocardiogram due to SOB. 08/18/22 Echo EF 75%, pulmonary hypertension, mild mitral/tricuspid valves insufficiency.    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   Dyslipidemia    Fibromyalgia    Granuloma annulare    History of uterine cancer 2003   treated with hysterectomy   Lumbar radicular syndrome    left L5   Macular degeneration    left eye   Sciatica    TIA (transient ischemic attack)    Past Surgical History:  Procedure Laterality Date   ABDOMINAL HYSTERECTOMY     BREAST BIOPSY  2007   CATARACT EXTRACTION     TONSILLECTOMY      Allergies  Allergen Reactions   Aricept [Donepezil Hcl]     Muscle cramps   Latex Itching   Tramadol Other (See Comments)    Pt can't remember what side effects she had, she doesn't take it now    Allergies as of 12/30/2022       Reactions   Aricept [donepezil Hcl]    Muscle cramps   Latex Itching   Tramadol Other (See Comments)   Pt can't remember what side effects  she had, she doesn't take it now        Medication List        Accurate as of December 30, 2022 11:59 PM. If you have any questions, ask your nurse or doctor.          aspirin EC 81 MG tablet Take 81 mg by mouth daily.   CALCIUM-VITAMIN D PO Take 1 tablet by mouth daily. '600mg'$  calcium, 800 units Vit D   furosemide 20 MG tablet Commonly known as: LASIX Take 20 mg by mouth. Every Monday and Thursday   melatonin 3 MG Tabs tablet Take 3 mg by mouth at bedtime.   memantine 5 MG tablet Commonly known as: NAMENDA Take by mouth 2  (two) times daily.   mirtazapine 30 MG tablet Commonly known as: REMERON TAKE ONE TABLET AT BEDTIME.   potassium chloride SA 20 MEQ tablet Commonly known as: KLOR-CON M Take 20 mEq by mouth daily. Monday and Thursday   PRESERVISION AREDS PO Take 1 capsule by mouth 2 (two) times daily.   Sentry Senior Tabs Take 2 tablets by mouth daily.   rivastigmine 1.5 MG capsule Commonly known as: EXELON Take 1 capsule (1.5 mg total) by mouth 2 (two) times daily.   sertraline 50 MG tablet Commonly known as: ZOLOFT Take 1 tablet (50 mg total) by mouth daily.   sodium fluoride 1.1 % Crea dental cream Commonly known as: PREVIDENT 5000 PLUS Place 1 Application onto teeth daily.   UNABLE TO FIND 1 capsule 2 (two) times daily. Sharp thoughts   vitamin B-12 500 MCG tablet Commonly known as: CYANOCOBALAMIN Take 500 mcg by mouth daily.        Review of Systems  Constitutional:  Negative for fatigue, fever and unexpected weight change.  HENT:  Positive for hearing loss. Negative for congestion, trouble swallowing and voice change.   Eyes:  Negative for visual disturbance.  Respiratory:  Negative for cough and shortness of breath.        ?DOE  Cardiovascular:  Positive for leg swelling.  Gastrointestinal:  Negative for abdominal pain and constipation.  Genitourinary:  Negative for dysuria, frequency and urgency.  Musculoskeletal:  Positive for gait problem.  Skin:  Negative for color change.  Neurological:  Negative for speech difficulty and headaches.       Dementia  Psychiatric/Behavioral:  Negative for behavioral problems and sleep disturbance. The patient is nervous/anxious.        Resistance to assistance at times.     Immunization History  Administered Date(s) Administered   Influenza, High Dose Seasonal PF 09/23/2017, 09/28/2019   Influenza,inj,Quad PF,6+ Mos 09/16/2018   Influenza-Unspecified 09/14/2017, 09/26/2020, 10/03/2021, 10/06/2022   Moderna SARS-COV2 Booster  Vaccination 10/23/2020, 05/14/2021   Moderna Sars-Covid-2 Vaccination 12/19/2019, 01/16/2020, 05/14/2021   Pneumococcal Conjugate-13 02/21/2014   Pneumococcal Polysaccharide-23 02/21/2002   Pneumococcal-Unspecified 12/15/2001   Tdap 12/15/2009, 10/13/2022   Unspecified SARS-COV-2 Vaccination 10/16/2022   Zoster Recombinat (Shingrix) 12/15/2005   Zoster, Unspecified 12/15/2005   Pertinent  Health Maintenance Due  Topic Date Due   INFLUENZA VACCINE  Completed   DEXA SCAN  Completed      05/01/2017   11:19 AM 09/21/2018    4:01 PM 12/29/2018    4:11 PM 02/08/2019    1:54 PM 03/07/2020   12:54 PM  [place] in the past year? Yes No 0 0 0  Was there an injury with Fall? No  0 0   Fall Risk Category Calculator  0 0   Fall Risk Category (Retired)   Low Low   (RETIRED) Patient Fall Risk Level   Low fall risk Low fall risk   Patient at Risk for Falls Due to Impaired balance/gait       Functional Status Survey:    Vitals:   12/30/22 1405  BP: 120/70  Pulse: 67  Resp: 20  Temp: (!) 97.3 F (36.3 C)  SpO2: 93%  Weight: 150 lb (68 kg)   Body mass index is 28.34 kg/m. Physical Exam Vitals and nursing note reviewed.  Constitutional:      Appearance: Normal appearance.  HENT:     Head: Normocephalic and atraumatic.     Mouth/Throat:     Mouth: Mucous membranes are moist.  Eyes:     Extraocular Movements: Extraocular movements intact.     Conjunctiva/sclera: Conjunctivae normal.     Pupils: Pupils are equal, round, and reactive to light.  Cardiovascular:     Rate and Rhythm: Normal rate and regular rhythm.     Heart sounds: No murmur heard. Pulmonary:     Effort: Pulmonary effort is normal.     Breath sounds: No rales.  Abdominal:     General: Bowel sounds are normal.     Palpations: Abdomen is soft.     Tenderness: There is no abdominal tenderness.  Musculoskeletal:     Cervical back: Normal range of motion and neck supple.     Right lower leg: Edema present.      Left lower leg: Edema present.     Comments: Near none  Skin:    General: Skin is warm and dry.  Neurological:     General: No focal deficit present.     Mental Status: She is alert. Mental status is at baseline.     Motor: No weakness.     Coordination: Coordination normal.     Gait: Gait abnormal.     Comments: Oriented to person, place.   Psychiatric:        Mood and Affect: Mood normal.        Behavior: Behavior normal.     Comments: Upon my visit today, conversing, smiling     Labs reviewed: Recent Labs    04/02/22 0000 09/04/22 0000  NA 138 138  K 4.4 4.4  CL 104 104  CO2 23* 27*  BUN 17 24*  CREATININE 1.2* 1.3*  CALCIUM 9.2 9.2   Recent Labs    04/02/22 0000 09/04/22 0000  AST 18 20  ALT 15 20  ALKPHOS  --  59  ALBUMIN 4.1 3.7   Recent Labs    04/02/22 0000 09/04/22 0000  WBC 6.5 5.1  NEUTROABS 3,829.00 2,938.00  HGB 12.7 12.2  HCT 37 36  PLT 270  --    Lab Results  Component Value Date   TSH 4.58 04/02/2022   No results found for: "HGBA1C" Lab Results  Component Value Date   CHOL 217 (A) 03/29/2021   HDL 48 03/29/2021   LDLCALC 152 03/29/2021   TRIG 70 03/29/2021   CHOLHDL 4.7 08/01/2019    Significant Diagnostic Results in last 30 days:  No results found.  Assessment/Plan: Grade I diastolic dysfunction Hx of grade I diastolic dysfunction 8119 per echocardiogram due to SOB, 08/18/22 EF 75%, pulmonary hypertension, mild tricuspid/mitral valve insufficiency. 08/22/22 schedule Furosemide '20mg'$ /Kcl 34mq M+Thr, weight stable.  The  patient's swelling ankles/BLE, mostly right, 08/04/22 negative DVT, BNP 51 8/31/2 Will increase Furosemide '20mg'$ /Kcl 248m po MWF,  repeat BMP one week.   Depression, recurrent ([place]) stable, on Mirtazapine, Sertraline  Hyperlipidemia not taking statin, LDL 64 02/11/22, takes ASA '81mg'$  qd, refused statin.  CKD (chronic kidney disease) stage 3, GFR 30-59 ml/min (HCC)  Bun/creat 24/1.27 09/04/22  Vitamin B12  deficiency Vit B12 deficiency, takes Vit B12, Vit B12 1010 03/02/20, Hgb 12.2 09/04/22  Senile dementia ([place]) MMSE 19/30 12/22,  takes Exelon, Memantine, resides [facility] for safety, care assistance. underwent Neurology consultation.    Family/ staff Communication: plan of care reviewed with the patient and charge nurse.   Labs/tests ordered:  none  Time spend 40 minutes.

## 2023-01-01 ENCOUNTER — Encounter: Payer: Self-pay | Admitting: Nurse Practitioner

## 2023-01-01 NOTE — Assessment & Plan Note (Signed)
12/12/21 DEXA t score -1.084, continue Vit D, Ca 12/31/22 DEXA t score -1.349

## 2023-02-15 ENCOUNTER — Emergency Department (HOSPITAL_COMMUNITY): Payer: Medicare PPO

## 2023-02-15 ENCOUNTER — Inpatient Hospital Stay (HOSPITAL_COMMUNITY)
Admission: EM | Admit: 2023-02-15 | Discharge: 2023-02-19 | DRG: 481 | Disposition: A | Discharge status: Skilled Nursing Facility | Payer: Medicare PPO | Source: Skilled Nursing Facility | Attending: Internal Medicine | Admitting: Internal Medicine

## 2023-02-15 ENCOUNTER — Other Ambulatory Visit: Payer: Self-pay

## 2023-02-15 DIAGNOSIS — Z818 Family history of other mental and behavioral disorders: Secondary | ICD-10-CM | POA: Diagnosis not present

## 2023-02-15 DIAGNOSIS — W1830XA Fall on same level, unspecified, initial encounter: Secondary | ICD-10-CM | POA: Diagnosis present

## 2023-02-15 DIAGNOSIS — Z79899 Other long term (current) drug therapy: Secondary | ICD-10-CM | POA: Diagnosis not present

## 2023-02-15 DIAGNOSIS — D62 Acute posthemorrhagic anemia: Secondary | ICD-10-CM | POA: Diagnosis not present

## 2023-02-15 DIAGNOSIS — N1831 Chronic kidney disease, stage 3a: Secondary | ICD-10-CM | POA: Diagnosis present

## 2023-02-15 DIAGNOSIS — R338 Other retention of urine: Secondary | ICD-10-CM | POA: Diagnosis present

## 2023-02-15 DIAGNOSIS — N1832 Chronic kidney disease, stage 3b: Secondary | ICD-10-CM | POA: Diagnosis present

## 2023-02-15 DIAGNOSIS — W19XXXA Unspecified fall, initial encounter: Principal | ICD-10-CM

## 2023-02-15 DIAGNOSIS — S72002A Fracture of unspecified part of neck of left femur, initial encounter for closed fracture: Secondary | ICD-10-CM

## 2023-02-15 DIAGNOSIS — K219 Gastro-esophageal reflux disease without esophagitis: Secondary | ICD-10-CM | POA: Diagnosis present

## 2023-02-15 DIAGNOSIS — Z66 Do not resuscitate: Secondary | ICD-10-CM | POA: Diagnosis present

## 2023-02-15 DIAGNOSIS — F02B3 Dementia in other diseases classified elsewhere, moderate, with mood disturbance: Secondary | ICD-10-CM | POA: Diagnosis present

## 2023-02-15 DIAGNOSIS — R339 Retention of urine, unspecified: Secondary | ICD-10-CM | POA: Diagnosis not present

## 2023-02-15 DIAGNOSIS — M21252 Flexion deformity, left hip: Secondary | ICD-10-CM | POA: Diagnosis present

## 2023-02-15 DIAGNOSIS — G301 Alzheimer's disease with late onset: Secondary | ICD-10-CM | POA: Diagnosis present

## 2023-02-15 DIAGNOSIS — Z8249 Family history of ischemic heart disease and other diseases of the circulatory system: Secondary | ICD-10-CM

## 2023-02-15 DIAGNOSIS — Z90710 Acquired absence of both cervix and uterus: Secondary | ICD-10-CM | POA: Diagnosis not present

## 2023-02-15 DIAGNOSIS — M5416 Radiculopathy, lumbar region: Secondary | ICD-10-CM | POA: Diagnosis present

## 2023-02-15 DIAGNOSIS — Y92129 Unspecified place in nursing home as the place of occurrence of the external cause: Secondary | ICD-10-CM

## 2023-02-15 DIAGNOSIS — Z803 Family history of malignant neoplasm of breast: Secondary | ICD-10-CM

## 2023-02-15 DIAGNOSIS — Z833 Family history of diabetes mellitus: Secondary | ICD-10-CM

## 2023-02-15 DIAGNOSIS — F02B4 Dementia in other diseases classified elsewhere, moderate, with anxiety: Secondary | ICD-10-CM | POA: Diagnosis present

## 2023-02-15 DIAGNOSIS — E785 Hyperlipidemia, unspecified: Secondary | ICD-10-CM | POA: Diagnosis present

## 2023-02-15 DIAGNOSIS — Z7982 Long term (current) use of aspirin: Secondary | ICD-10-CM

## 2023-02-15 DIAGNOSIS — R1310 Dysphagia, unspecified: Secondary | ICD-10-CM

## 2023-02-15 DIAGNOSIS — I5032 Chronic diastolic (congestive) heart failure: Secondary | ICD-10-CM | POA: Diagnosis present

## 2023-02-15 DIAGNOSIS — M797 Fibromyalgia: Secondary | ICD-10-CM | POA: Diagnosis present

## 2023-02-15 DIAGNOSIS — S72142A Displaced intertrochanteric fracture of left femur, initial encounter for closed fracture: Principal | ICD-10-CM | POA: Diagnosis present

## 2023-02-15 DIAGNOSIS — Z888 Allergy status to other drugs, medicaments and biological substances status: Secondary | ICD-10-CM

## 2023-02-15 DIAGNOSIS — Z91040 Latex allergy status: Secondary | ICD-10-CM

## 2023-02-15 DIAGNOSIS — F32A Depression, unspecified: Secondary | ICD-10-CM | POA: Diagnosis present

## 2023-02-15 DIAGNOSIS — D649 Anemia, unspecified: Secondary | ICD-10-CM | POA: Diagnosis not present

## 2023-02-15 DIAGNOSIS — I10 Essential (primary) hypertension: Secondary | ICD-10-CM | POA: Diagnosis not present

## 2023-02-15 DIAGNOSIS — Z8673 Personal history of transient ischemic attack (TIA), and cerebral infarction without residual deficits: Secondary | ICD-10-CM | POA: Diagnosis not present

## 2023-02-15 DIAGNOSIS — Z885 Allergy status to narcotic agent status: Secondary | ICD-10-CM

## 2023-02-15 DIAGNOSIS — Z8542 Personal history of malignant neoplasm of other parts of uterus: Secondary | ICD-10-CM

## 2023-02-15 DIAGNOSIS — N289 Disorder of kidney and ureter, unspecified: Secondary | ICD-10-CM | POA: Diagnosis not present

## 2023-02-15 DIAGNOSIS — M7989 Other specified soft tissue disorders: Secondary | ICD-10-CM | POA: Diagnosis not present

## 2023-02-15 DIAGNOSIS — D6489 Other specified anemias: Secondary | ICD-10-CM | POA: Diagnosis present

## 2023-02-15 DIAGNOSIS — N183 Chronic kidney disease, stage 3 unspecified: Secondary | ICD-10-CM | POA: Diagnosis present

## 2023-02-15 DIAGNOSIS — D509 Iron deficiency anemia, unspecified: Secondary | ICD-10-CM | POA: Diagnosis present

## 2023-02-15 LAB — CBC WITH DIFFERENTIAL/PLATELET
Abs Immature Granulocytes: 0.07 10*3/uL (ref 0.00–0.07)
Basophils Absolute: 0 10*3/uL (ref 0.0–0.1)
Basophils Relative: 0 %
Eosinophils Absolute: 0.1 10*3/uL (ref 0.0–0.5)
Eosinophils Relative: 1 %
HCT: 35.7 % — ABNORMAL LOW (ref 36.0–46.0)
Hemoglobin: 11.9 g/dL — ABNORMAL LOW (ref 12.0–15.0)
Immature Granulocytes: 1 %
Lymphocytes Relative: 20 %
Lymphs Abs: 2.2 10*3/uL (ref 0.7–4.0)
MCH: 32.3 pg (ref 26.0–34.0)
MCHC: 33.3 g/dL (ref 30.0–36.0)
MCV: 97 fL (ref 80.0–100.0)
Monocytes Absolute: 0.7 10*3/uL (ref 0.1–1.0)
Monocytes Relative: 6 %
Neutro Abs: 7.8 10*3/uL — ABNORMAL HIGH (ref 1.7–7.7)
Neutrophils Relative %: 72 %
Platelets: 247 10*3/uL (ref 150–400)
RBC: 3.68 MIL/uL — ABNORMAL LOW (ref 3.87–5.11)
RDW: 12.9 % (ref 11.5–15.5)
WBC: 10.9 10*3/uL — ABNORMAL HIGH (ref 4.0–10.5)
nRBC: 0 % (ref 0.0–0.2)

## 2023-02-15 LAB — COMPREHENSIVE METABOLIC PANEL
ALT: 21 U/L (ref 0–44)
AST: 24 U/L (ref 15–41)
Albumin: 3.8 g/dL (ref 3.5–5.0)
Alkaline Phosphatase: 54 U/L (ref 38–126)
Anion gap: 8 (ref 5–15)
BUN: 29 mg/dL — ABNORMAL HIGH (ref 8–23)
CO2: 24 mmol/L (ref 22–32)
Calcium: 8.9 mg/dL (ref 8.9–10.3)
Chloride: 104 mmol/L (ref 98–111)
Creatinine, Ser: 1.19 mg/dL — ABNORMAL HIGH (ref 0.44–1.00)
GFR, Estimated: 44 mL/min — ABNORMAL LOW (ref >60)
Glucose, Bld: 127 mg/dL — ABNORMAL HIGH (ref 70–99)
Potassium: 4.2 mmol/L (ref 3.5–5.1)
Sodium: 136 mmol/L (ref 135–145)
Total Bilirubin: 0.5 mg/dL (ref 0.3–1.2)
Total Protein: 7 g/dL (ref 6.5–8.1)

## 2023-02-15 MED ORDER — ONDANSETRON HCL 4 MG/2ML IJ SOLN: 4.0000 mg | Freq: Four times a day (QID) | INTRAMUSCULAR | Status: DC | PRN

## 2023-02-15 MED ORDER — MEMANTINE HCL 10 MG PO TABS
5.0000 mg | ORAL_TABLET | Freq: Two times a day (BID) | ORAL | Status: DC
  Administered 2023-02-15 – 2023-02-19 (×8): 5 mg via ORAL
  Filled 2023-02-15 (×8): qty 1

## 2023-02-15 MED ORDER — ACETAMINOPHEN 650 MG RE SUPP: 650.0000 mg | Freq: Four times a day (QID) | RECTAL | Status: DC | PRN

## 2023-02-15 MED ORDER — PANTOPRAZOLE SODIUM 40 MG PO TBEC
40.0000 mg | DELAYED_RELEASE_TABLET | Freq: Every day | ORAL | Status: DC
  Administered 2023-02-17 – 2023-02-19 (×3): 40 mg via ORAL
  Filled 2023-02-15 (×3): qty 1

## 2023-02-15 MED ORDER — MIRTAZAPINE 15 MG PO TABS
30.0000 mg | ORAL_TABLET | Freq: Every day | ORAL | Status: DC
  Administered 2023-02-15 – 2023-02-18 (×4): 30 mg via ORAL
  Filled 2023-02-15 (×4): qty 2

## 2023-02-15 MED ORDER — CYCLOBENZAPRINE HCL 5 MG PO TABS
5.0000 mg | ORAL_TABLET | Freq: Three times a day (TID) | ORAL | Status: DC | PRN
  Administered 2023-02-15 – 2023-02-18 (×4): 5 mg via ORAL
  Filled 2023-02-15 (×4): qty 1

## 2023-02-15 MED ORDER — SERTRALINE HCL 50 MG PO TABS
50.0000 mg | ORAL_TABLET | Freq: Every day | ORAL | Status: DC
  Administered 2023-02-16 – 2023-02-19 (×4): 50 mg via ORAL
  Filled 2023-02-15 (×4): qty 1

## 2023-02-15 MED ORDER — HYDROMORPHONE HCL 1 MG/ML IJ SOLN
1.0000 mg | INTRAMUSCULAR | Status: DC | PRN
  Administered 2023-02-15 – 2023-02-17 (×5): 1 mg via INTRAVENOUS
  Filled 2023-02-15 (×6): qty 1

## 2023-02-15 MED ORDER — ACETAMINOPHEN 500 MG PO TABS
500.0000 mg | ORAL_TABLET | Freq: Three times a day (TID) | ORAL | Status: DC
  Administered 2023-02-15 – 2023-02-19 (×8): 500 mg via ORAL
  Filled 2023-02-15 (×9): qty 1

## 2023-02-15 MED ORDER — RIVASTIGMINE TARTRATE 1.5 MG PO CAPS
1.5000 mg | ORAL_CAPSULE | Freq: Two times a day (BID) | ORAL | Status: DC
  Administered 2023-02-15 – 2023-02-19 (×8): 1.5 mg via ORAL
  Filled 2023-02-15 (×9): qty 1

## 2023-02-15 MED ORDER — ACETAMINOPHEN 325 MG PO TABS
650.0000 mg | ORAL_TABLET | Freq: Four times a day (QID) | ORAL | Status: DC | PRN
  Administered 2023-02-17: 650 mg via ORAL
  Filled 2023-02-15: qty 2

## 2023-02-15 MED ORDER — ONDANSETRON HCL 4 MG PO TABS: 4.0000 mg | ORAL_TABLET | Freq: Four times a day (QID) | ORAL | Status: DC | PRN

## 2023-02-15 MED ORDER — FENTANYL CITRATE PF 50 MCG/ML IJ SOSY
50.0000 ug | PREFILLED_SYRINGE | Freq: Once | INTRAMUSCULAR | Status: AC
  Administered 2023-02-15: 50 ug via INTRAVENOUS
  Filled 2023-02-15: qty 1

## 2023-02-15 MED ORDER — ASPIRIN 81 MG PO TBEC
81.0000 mg | DELAYED_RELEASE_TABLET | Freq: Every day | ORAL | Status: DC
  Administered 2023-02-17 – 2023-02-19 (×3): 81 mg via ORAL
  Filled 2023-02-15 (×3): qty 1

## 2023-02-15 MED ORDER — OXYCODONE HCL 5 MG PO TABS
5.0000 mg | ORAL_TABLET | ORAL | Status: DC | PRN
  Administered 2023-02-18 – 2023-02-19 (×4): 5 mg via ORAL
  Filled 2023-02-15 (×5): qty 1

## 2023-02-15 NOTE — Assessment & Plan Note (Addendum)
Closed left hip fracture.  Status post left intertrochanteric intramedullary rodding performed 3/4 by Dr. Laurance Flatten Patient continuing to experience substantial pain managed with as needed opiate-based analgesics Patient also receiving scheduled Tylenol Slowly increasing patient's participation with physical therapy Patient will require skilled physical therapy services in a skilled nursing facility

## 2023-02-15 NOTE — ED Triage Notes (Signed)
Pt took a mechanical fall that was witnessed at the nursing home. Pt complains of left hip pain. EMS gave 128mg of Fentanyl enroute. Pt is Aox2 at baseline with GCS of 14 at baseline.

## 2023-02-15 NOTE — Assessment & Plan Note (Signed)
Strict intake and output monitoring Creatinine near baseline Minimizing nephrotoxic agents as much as possible Serial chemistries to monitor renal function and electrolytes  

## 2023-02-15 NOTE — Assessment & Plan Note (Signed)
Patient with trace edema lower extremities but not clinical signs of heart failure decompensation. Continue blood pressure monitoring. Hold on diuretic therapy for now.

## 2023-02-15 NOTE — Assessment & Plan Note (Signed)
No agitation. Her family is at the bedside and confirmed code status of DNR.  Continue with sertraline, mirtazapine,  rivastigmine and memantine.  Neuro checks per unit protocol PT and OT after orthopedic procedure.

## 2023-02-15 NOTE — H&P (Signed)
History and Physical    Patient: Janet [name] [id number] DOB: Sep 28, 1932 DOA: 02/15/2023 DOS: the patient was seen and examined on 02/15/2023 PCP: Mast, Man X, NP  Patient coming from: ALF/ILF  Chief Complaint:  Chief Complaint  Patient presents with   Fall   HPI: Janet [name] is a 89 y.o. female with medical history significant of dementia, diastolic heart failure, and chronic kidney disease who presented after a mechanical fall from her own hight. She dose have moderate dementia and not able to give detail history, most information from her family at the bedside, daughter and granddaughter. Patient lived in assisted living facility, she has been at her usual state of health. She does have moderate dementia and ambulates with the help of a caine.  Today while she was getting off the elevator she sustained a mechanical fall landing on her left side, no head trauma or loss of consciousness.  Post trauma she has deformity on her left lower extremity and severe pain, not able to stand back on her feet.  EMS was called and she was found in severe pain, she required 100 mcg of fentanyl for pain control, given on route to the ED.    Review of Systems: As mentioned in the history of present illness. All other systems reviewed and are negative. 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 She has never used smokeless tobacco. She reports that she does not drink alcohol and does not use drugs.  Allergies  Allergen Reactions   Aricept [Donepezil Hcl]     Muscle cramps   Latex Itching   Tramadol Other (See Comments)    Pt can't remember what side effects she had, she doesn't take it now    Family History  Problem Relation Age of Onset   Depression Mother    Diabetes Father    Heart attack Father    Dementia Sister    Pneumonia Brother    Breast cancer Daughter     Prior to Admission medications   Medication Sig Start Date End Date Taking? Authorizing Provider  aspirin EC 81 MG tablet Take 81 mg by mouth daily.    [provider]  CALCIUM-VITAMIN D PO Take 1 tablet by mouth daily. '600mg'$  calcium, 800 units Vit D    [provider]  furosemide (LASIX) 20 MG tablet Take 20 mg by mouth. Every Monday and Thursday    [provider]  melatonin 3 MG TABS tablet Take 3 mg by mouth at bedtime.    [provider]  memantine (NAMENDA) 5 MG tablet Take by mouth 2 (two) times daily. 10/16/22   [provider]  mirtazapine (  REMERON) 30 MG tablet TAKE ONE TABLET AT BEDTIME. 06/01/20   [name], MD  Multiple Vitamins-Minerals (PRESERVISION AREDS PO) Take 1 capsule by mouth 2 (two) times daily.    [provider]  Multiple Vitamins-Minerals (SENTRY SENIOR) TABS Take 2 tablets by mouth daily.    [provider]  potassium chloride SA (KLOR-CON M) 20 MEQ tablet Take 20 mEq by mouth daily. Monday and Thursday    [provider]  rivastigmine (EXELON) 1.5 MG capsule Take 1 capsule (1.5 mg total) by mouth 2 (two) times daily. 04/27/20   Kathrynn Ducking, MD  sertraline (ZOLOFT) 50 MG tablet Take 1 tablet (50 mg total) by mouth daily. 01/05/20   [name], MD  sodium fluoride (PREVIDENT 5000 PLUS) 1.1 % CREA dental cream Place 1 Application onto teeth daily.    [provider]  UNABLE TO FIND 1 capsule 2 (two) times daily. Sharp thoughts    [provider]  vitamin B-12 (CYANOCOBALAMIN) 500 MCG tablet Take 500 mcg by mouth daily.    [provider]    Physical Exam: Vitals:   02/15/23 2005  BP: (!) 172/77  Pulse: 89  Resp: 18  Temp: 98.2 F (36.8 C)  TempSrc: Oral  SpO2: 95%   Neurology awake and alert, in pain.,  ENT with mild pallor Cardiovascular with S1 and S2 present and rhythmic with no gallops, rubs or murmurs No JVD Trace lower extremity edema Respiratory with no rales or wheezing, no rhonchi Abdomen with no distention  Left lower extremity is externally rotated and shortened, tender to palpation.  Data Reviewed:   Na 136, K 4,2 CL 104, bicarbonate 24 glucose 127 bun 29 cr 1,19 Wbc 10,9 hgb 11.9 plt 247  Head and neck CT with no acute changes.   Chest radiograph with left rotation, no infiltrates, or effusions.  Pelvic radiograph with severely comminuted left intra trochanteric fracture.   EKG 96 bpm, normal axis, normal intervals, sinus rhythm with right atrial enlargement, with no significant ST segment or T wave changes.   89 yo female with moderate dementia, sp mechanical fall from her own hight, no head trauma. Hemodynamically stable. Externally rotated left lower extremity. Radiograph confirms closed left hip fracture.    Assessment and Plan: * Closed left hip fracture (HCC) Closed left hip fracture.   Patient will be admitted to the medical ward.  Continue pain control with hydromorphone and oral oxycodone.  Scheduled acetaminophen and as needed flexeril. Mechanical DVT prophylaxis Scheduled antiacid therapy  Follow up with orthopedics recommendations. Will keep patient NPO after midnight, for possible surgical intervention tomorrow.   Moderate late onset Alzheimer's dementia with mood disturbance (HCC) No agitation. Her family is at the bedside and confirmed code status of  DNR.  Continue with sertraline, mirtazapine,  rivastigmine and memantine.  Neuro checks per unit protocol PT and OT after orthopedic procedure.   CKD (chronic kidney disease) stage 3, GFR 30-59 ml/min (HCC) Renal function stable with serum cr at 1,19 Plan to hold on Iv fluids for now and follow up renal function in am, avoid hypotension and nephrotoxic medications. Hold on diuretics.  Chronic diastolic CHF (congestive heart failure) (HCC) Patient with trace edema lower extremities but not clinical signs of heart failure decompensation. Continue blood pressure monitoring. Hold on diuretic therapy for now.       Advance Care Planning:   Code Status: DNR   Consults: orthopedics, consulted per ED.   Family Communication: I spoke  with patient's daughter and granddaughter at the bedside, we talked in detail about patient's condition, plan of care and prognosis and all questions were addressed.   Severity of Illness: The appropriate patient status for this patient is INPATIENT. Inpatient status is judged to be reasonable and necessary in order to provide the required intensity of service to ensure the patient's safety. The patient's presenting symptoms, physical exam findings, and initial radiographic and laboratory data in the context of their chronic comorbidities is felt to place them at high risk for further clinical deterioration. Furthermore, it is not anticipated that the patient will be medically stable for discharge from the hospital within 2 midnights of admission.   * I certify that at the point of admission it is my clinical judgment that the patient will require inpatient hospital care spanning beyond 2 midnights from the point of admission due to high intensity of service, high risk for further deterioration and high frequency of surveillance required.*  Author: Tawni Millers, MD 02/15/2023 10:08 PM  For on call review [email or web address].

## 2023-02-15 NOTE — ED Provider Notes (Signed)
[facility] Provider Note   CSN: [id number] Arrival date & time: 02/15/23  1955     History  Chief Complaint  Patient presents with   Lytle Michaels    Janet [name] is a 89 y.o. female.  89 year old female with a past medical history of lumbar radiculopathy, depression, TIA presents to the ED status post mechanical fall.  According to EMS report, patient was at her nursing home when suddenly she fell landing on the left hip.  Was not able to get up.  EMS provided her with 100 mics of fentanyl in route.  Patient is ANO x 2 at baseline and has a GCS of 14.  On arrival patient is endorsing severe pain to the left hip with any movement along with palpation.  Does not have any signs of trauma to her head.  Level 5 caveat due to mental status.  The history is provided by the patient, medical records and the EMS personnel.  Fall       Home Medications Prior to Admission medications   Medication Sig Start Date End Date Taking? Authorizing Provider  acetaminophen (TYLENOL) 500 MG tablet Take 2 tablets (1,000 mg total) by mouth every 8 (eight) hours for 21 days. 02/18/23 03/11/23 Yes Callie Fielding, MD  CALCIUM-VITAMIN D PO Take 1 tablet by mouth daily. '600mg'$  calcium, 800 units Vit D   Yes [provider]  furosemide (LASIX) 20 MG tablet Take 20 mg by mouth as directed. Every MWF   Yes [provider]  HYDROcodone-acetaminophen (HYCET) 7.5-325 mg/15 ml solution Take 15 mLs by mouth 4 (four) times daily as needed for moderate pain. 02/18/23 02/18/24 Yes Callie Fielding, MD  LORazepam (ATIVAN) 0.5 MG tablet Take 0.5 mg by mouth daily as needed (dementia). 01/29/23  Yes [provider]  melatonin 3 MG TABS tablet Take 3 mg by mouth at bedtime.   Yes [provider]  memantine (NAMENDA) 5 MG tablet Take by mouth 2 (two) times daily. 10/16/22  Yes [provider]  mirtazapine (REMERON) 30 MG tablet TAKE ONE TABLET AT  BEDTIME. 06/01/20  Yes [name], MD  Multiple Vitamins-Minerals (PRESERVISION AREDS PO) Take 1 capsule by mouth 2 (two) times daily.   Yes [provider]  Multiple Vitamins-Minerals (SENTRY SENIOR) TABS Take 2 tablets by mouth daily.   Yes [provider]  rivastigmine (EXELON) 1.5 MG capsule Take 1 capsule (1.5 mg total) by mouth 2 (two) times daily. 04/27/20  Yes Kathrynn Ducking, MD  sertraline (ZOLOFT) 50 MG tablet Take 1 tablet (50 mg total) by mouth daily. 01/05/20  Yes [name], MD  sodium fluoride (PREVIDENT 5000 PLUS) 1.1 % CREA dental cream Place 1 Application onto teeth daily.   Yes [provider]  UNABLE TO FIND Take 1 capsule by mouth 2 (two) times daily. Sharp thoughts   Yes [provider]  vitamin B-12 (CYANOCOBALAMIN) 500 MCG tablet Take 500 mcg by mouth daily.   Yes [provider]  aspirin EC 81 MG tablet Take 1 tablet (81 mg total) by mouth 2 (two) times daily. 02/19/23 04/02/23  Shalhoub, Sherryll Burger, MD  feeding supplement (ENSURE SURGERY) LIQD Take 237 mLs by mouth 2 (two) times daily between meals. 02/19/23   Shalhoub, Sherryll Burger, MD  pantoprazole (PROTONIX) 40 MG tablet Take 1 tablet (40 mg total) by mouth daily. 02/20/23   Shalhoub, Sherryll Burger, MD  polyethylene glycol (MIRALAX) 17 g packet  Take 17 g by mouth daily as needed for up to 14 days. 02/19/23 03/05/23  Shalhoub, Sherryll Burger, MD  potassium chloride SA (KLOR-CON M) 20 MEQ tablet Take 20 mEq by mouth as directed. Take on MWF    [provider]  senna (SENOKOT) 8.6 MG TABS tablet Take 1 tablet (8.6 mg total) by mouth at bedtime. 02/19/23   Shalhoub, Sherryll Burger, MD      Allergies    Aricept Reather Littler hcl], Latex, and Tramadol    Review of Systems   Review of Systems  Unable to perform ROS: Mental status change  Constitutional:  Negative for fever.  Musculoskeletal:  Positive for arthralgias.    Physical Exam Updated Vital Signs BP (!) 141/85 (BP Location: Right  Arm)   Pulse (!) 108   Temp 98.9 F (37.2 C) (Oral)   Resp 16   Ht '5\' 1"'$  (1.549 m)   Wt 69 kg   SpO2 97%   BMI 28.74 kg/m  Physical Exam Vitals and nursing note reviewed.  Constitutional:      Appearance: Normal appearance.  HENT:     Head: Normocephalic and atraumatic.     Mouth/Throat:     Mouth: Mucous membranes are moist.  Eyes:     Pupils: Pupils are equal, round, and reactive to light.  Cardiovascular:     Rate and Rhythm: Normal rate.     Pulses:          Dorsalis pedis pulses are 2+ on the right side and 2+ on the left side.  Pulmonary:     Effort: Pulmonary effort is normal.     Breath sounds: No wheezing.  Abdominal:     General: Abdomen is flat.     Palpations: Abdomen is soft.  Musculoskeletal:     Cervical back: Normal range of motion and neck supple.     Left hip: Deformity, tenderness and bony tenderness present. Decreased range of motion. Decreased strength.     Comments: Left hip is externally rotated, and shortened.  Skin:    General: Skin is warm and dry.  Neurological:     Mental Status: She is alert.     Comments: AO x 2.     ED Results / Procedures / Treatments   Labs (all labs ordered are listed, but only abnormal results are displayed) Labs Reviewed  CBC WITH DIFFERENTIAL/PLATELET - Abnormal; Notable for the following components:      Result Value   WBC 10.9 (*)    RBC 3.68 (*)    Hemoglobin 11.9 (*)    HCT 35.7 (*)    Neutro Abs 7.8 (*)    All other components within normal limits  COMPREHENSIVE METABOLIC PANEL - Abnormal; Notable for the following components:   Glucose, Bld 127 (*)    BUN 29 (*)    Creatinine, Ser 1.19 (*)    GFR, Estimated 44 (*)    All other components within normal limits  CBC - Abnormal; Notable for the following components:   RBC 3.25 (*)    Hemoglobin 10.6 (*)    HCT 31.5 (*)    All other components within normal limits  BASIC METABOLIC PANEL - Abnormal; Notable for the following components:   Glucose,  Bld 105 (*)    BUN 26 (*)    Creatinine, Ser 1.10 (*)    Calcium 8.6 (*)    GFR, Estimated 48 (*)    All other components within normal limits  BASIC METABOLIC PANEL -  Abnormal; Notable for the following components:   Sodium 132 (*)    Glucose, Bld 157 (*)    BUN 24 (*)    Creatinine, Ser 1.29 (*)    Calcium 7.6 (*)    GFR, Estimated 40 (*)    All other components within normal limits  IRON AND TIBC - Abnormal; Notable for the following components:   Iron 22 (*)    Saturation Ratios 8 (*)    All other components within normal limits  RETICULOCYTES - Abnormal; Notable for the following components:   RBC. 2.13 (*)    All other components within normal limits  CBC - Abnormal; Notable for the following components:   RBC 2.03 (*)    Hemoglobin 6.7 (*)    HCT 20.4 (*)    MCV 100.5 (*)    All other components within normal limits  HEMOGLOBIN AND HEMATOCRIT, BLOOD - Abnormal; Notable for the following components:   Hemoglobin 7.6 (*)    HCT 22.8 (*)    All other components within normal limits  BASIC METABOLIC PANEL - Abnormal; Notable for the following components:   Sodium 134 (*)    Glucose, Bld 127 (*)    BUN 24 (*)    Creatinine, Ser 1.19 (*)    Calcium 7.7 (*)    GFR, Estimated 44 (*)    All other components within normal limits  CBC - Abnormal; Notable for the following components:   RBC 2.28 (*)    Hemoglobin 7.4 (*)    HCT 22.7 (*)    Platelets 130 (*)    All other components within normal limits  CBC - Abnormal; Notable for the following components:   RBC 2.89 (*)    Hemoglobin 9.2 (*)    HCT 27.7 (*)    All other components within normal limits  COMPREHENSIVE METABOLIC PANEL - Abnormal; Notable for the following components:   Glucose, Bld 110 (*)    Calcium 7.7 (*)    Total Protein 5.7 (*)    Albumin 2.9 (*)    GFR, Estimated 56 (*)    All other components within normal limits  MAGNESIUM - Abnormal; Notable for the following components:   Magnesium 2.6 (*)     All other components within normal limits  MRSA NEXT GEN BY PCR, NASAL  VITAMIN B12  FOLATE  FERRITIN  PREPARE RBC (CROSSMATCH)  TYPE AND SCREEN  ABO/RH  PREPARE RBC (CROSSMATCH)    EKG None  Radiology No results found.  Procedures Procedures    Medications Ordered in ED Medications  aspirin EC tablet 81 mg (81 mg Oral Given 02/19/23 1019)  memantine (NAMENDA) tablet 5 mg (5 mg Oral Given 02/19/23 1019)  mirtazapine (REMERON) tablet 30 mg (30 mg Oral Given 02/18/23 2309)  rivastigmine (EXELON) capsule 1.5 mg (1.5 mg Oral Given 02/19/23 1019)  sertraline (ZOLOFT) tablet 50 mg (50 mg Oral Given 02/19/23 1019)  acetaminophen (TYLENOL) tablet 650 mg (650 mg Oral Given 02/17/23 0757)    Or  acetaminophen (TYLENOL) suppository 650 mg ( Rectal See Alternative 02/17/23 0757)  ondansetron (ZOFRAN) tablet 4 mg ( Oral MAR Unhold 02/16/23 2154)    Or  ondansetron (ZOFRAN) injection 4 mg ( Intravenous MAR Unhold 02/16/23 2154)  HYDROmorphone (DILAUDID) injection 1 mg (1 mg Intravenous Given 02/17/23 1412)  oxyCODONE (Oxy IR/ROXICODONE) immediate release tablet 5 mg (5 mg Oral Given 02/19/23 1019)  acetaminophen (TYLENOL) tablet 500 mg (500 mg Oral Given 02/19/23 1019)  cyclobenzaprine (FLEXERIL) tablet 5  mg (5 mg Oral Given 02/18/23 1539)  pantoprazole (PROTONIX) EC tablet 40 mg (40 mg Oral Given 02/19/23 1019)  0.45 % sodium chloride infusion (0 mLs Intravenous Stopped 02/17/23 1036)  LORazepam (ATIVAN) tablet 0.5 mg ( Oral MAR Unhold 02/16/23 2154)  tranexamic acid (CYKLOKAPRON) IVPB 1,000 mg (1,000 mg Intravenous Not Given 02/16/23 2209)  ferric gluconate (FERRLECIT) 250 mg in sodium chloride 0.9 % 250 mL IVPB (250 mg Intravenous Not Given 02/19/23 1201)  feeding supplement (ENSURE SURGERY) liquid 237 mL (has no administration in time range)  fentaNYL (SUBLIMAZE) injection 50 mcg (50 mcg Intravenous Given 02/15/23 2056)  ceFAZolin (ANCEF) IVPB 2g/100 mL premix ( Intravenous MAR Unhold 02/16/23 2154)  tranexamic acid  (CYKLOKAPRON) IVPB 1,000 mg ( Intravenous MAR Unhold 02/16/23 2154)  chlorhexidine (PERIDEX) 0.12 % solution 15 mL (15 mLs Mouth/Throat Given 02/16/23 1603)  acetaminophen (OFIRMEV) 10 MG/ML IV (  Override pull for Anesthesia 02/16/23 1814)  acetaminophen (OFIRMEV) IV 1,000 mg (1,000 mg Intravenous Given 02/16/23 1808)  ceFAZolin (ANCEF) IVPB 2g/100 mL premix (0 g Intravenous Stopped 02/17/23 0830)  0.9 %  sodium chloride infusion (Manually program via Guardrails IV Fluids) ( Intravenous New Bag/Given 02/17/23 1024)  furosemide (LASIX) tablet 20 mg (20 mg Oral Given 02/17/23 1206)  0.9 %  sodium chloride infusion (Manually program via Guardrails IV Fluids) ( Intravenous New Bag/Given 02/18/23 2010)    ED Course/ Medical Decision Making/ A&P                             Medical Decision Making Amount and/or Complexity of Data Reviewed Labs: ordered. Radiology: ordered.  Risk Prescription drug management. Decision regarding hospitalization.    This patient presents to the ED for concern of left hip pain, this involves a number of treatment options, and is a complaint that carries with it a high risk of complications and morbidity.  The differential diagnosis includes hip fracture, hip dislocation versus subluxation.    Co morbidities: Discussed in HPI   Brief History:  Patient coming in from nursing home status post witnessed mechanical fall.  Endorsing pain along the left hip which appears externally rotated and shortened.  She was given 100 mics of fentanyl by EMS with some improvement in her symptoms.  EMR reviewed including pt PMHx, past surgical history and past visits to ER.   See HPI for more details   Lab Tests:  I ordered and independently interpreted labs.  The pertinent results include:    I personally reviewed all laboratory work and imaging. Metabolic panel without any acute abnormality specifically kidney function within normal limits and no significant electrolyte  abnormalities. CBC without leukocytosis or significant anemia.  Imaging Studies:  AP of the pelvis obtained with an obvious deformity to the left femoral neck.   CT HEAD/Cervical spine is pending.   Cardiac Monitoring:  The patient was maintained on a cardiac monitor.  I personally viewed and interpreted the cardiac monitored which showed an underlying rhythm of: NSR EKG non-ischemic   Medicines ordered:  I ordered medication including fentanyl  for pain control Reevaluation of the patient after these medicines showed that the patient improved I have reviewed the patients home medicines and have made adjustments as needed  Consults:  I requested consultation with Dr. Ileene Rubens  ,  and discussed lab and imaging findings as well as pertinent plan - they will plan for fixation Monday night 02/16/2023  Reevaluation:  After the interventions  noted above I re-evaluated patient and found that they have :stayed the same   Social Determinants of Health:  The patient's social determinants of health were a factor in the care of this patient   Problem List / ED Course:  Patient arrived via EMS status post mechanical fall witnessed by staff with some left hip pain.  Hip appears externally rotated, shortened concerning for fracture versus location.She was given 100 mcg of fentanyl by EMS with some improvement in her symptoms.  She is currently on no blood thinners, reports she did not hit her head.  However, patient is alert and oriented x 2 and is at baseline per staff. 8:50 PM Spoke to [place], patient fell down at the elevator she got up to go to the bathroom.She did not hit her head by stand just landed on the left side.She is baseline per staff, she will repeat herself and forget. **NOT on blood thinners**.  Blood work along with imaging was obtained.  CT head and lumbar and cervical spine are still pending.  On wet read she does have a broken left femoral neck fracture.  I  discussed this case with Dr. Ileene Rubens who is aware of patient and recommended admission via medicine service.  Patient is hemodynamically for admission at this time.   Dispostion:  Spoke to Dr. Franz Dell of [facility] who will admit patient for further management of her left hip fracture.   Portions of this note were generated with Lobbyist. Dictation errors may occur despite best attempts at proofreading.   Final Clinical Impression(s) / ED Diagnoses Final diagnoses:  Fall, initial encounter  Fracture, hip, left, closed, initial encounter [facility])    Rx / DC Orders ED Discharge Orders          Ordered    pantoprazole (PROTONIX) 40 MG tablet  Daily        02/19/23 1334    senna (SENOKOT) 8.6 MG TABS tablet  Daily at bedtime        02/19/23 1334    polyethylene glycol (MIRALAX) 17 g packet  Daily PRN        02/19/23 1334    feeding supplement (ENSURE SURGERY) LIQD  2 times daily between meals        02/19/23 1334    Increase activity slowly        02/19/23 1334    Diet - low sodium heart healthy        02/19/23 1334    aspirin EC 81 MG tablet  2 times daily        02/19/23 1339    aspirin EC 81 MG tablet  2 times daily,   Status:  Discontinued        02/18/23 0630    HYDROcodone-acetaminophen (HYCET) 7.5-325 mg/15 ml solution  4 times daily PRN        02/18/23 0630    polyethylene glycol (MIRALAX) 17 g packet  Daily,   Status:  Discontinued        02/18/23 0630    acetaminophen (TYLENOL) 500 MG tablet  Every 8 hours        02/18/23 0630    senna (SENOKOT) 8.6 MG TABS tablet  2 times daily,   Status:  Discontinued        02/18/23 0630              Janeece Fitting, PA-C 02/19/23 1421    Davonna Belling, MD 02/20/23 1453

## 2023-02-16 ENCOUNTER — Inpatient Hospital Stay (HOSPITAL_COMMUNITY): Payer: Medicare PPO | Admitting: Certified Registered Nurse Anesthetist

## 2023-02-16 ENCOUNTER — Inpatient Hospital Stay (HOSPITAL_COMMUNITY): Payer: Medicare PPO

## 2023-02-16 ENCOUNTER — Encounter (HOSPITAL_COMMUNITY): Payer: Self-pay | Admitting: Internal Medicine

## 2023-02-16 ENCOUNTER — Encounter (HOSPITAL_COMMUNITY)
Admission: EM | Disposition: A | Discharge status: Skilled Nursing Facility | Payer: Self-pay | Source: Skilled Nursing Facility | Attending: Family Medicine

## 2023-02-16 ENCOUNTER — Encounter: Payer: Self-pay | Admitting: Nurse Practitioner

## 2023-02-16 ENCOUNTER — Other Ambulatory Visit: Payer: Self-pay

## 2023-02-16 DIAGNOSIS — I10 Essential (primary) hypertension: Secondary | ICD-10-CM

## 2023-02-16 DIAGNOSIS — W19XXXA Unspecified fall, initial encounter: Secondary | ICD-10-CM | POA: Insufficient documentation

## 2023-02-16 DIAGNOSIS — S72142A Displaced intertrochanteric fracture of left femur, initial encounter for closed fracture: Secondary | ICD-10-CM | POA: Diagnosis not present

## 2023-02-16 DIAGNOSIS — N289 Disorder of kidney and ureter, unspecified: Secondary | ICD-10-CM

## 2023-02-16 DIAGNOSIS — D649 Anemia, unspecified: Secondary | ICD-10-CM

## 2023-02-16 DIAGNOSIS — S72002A Fracture of unspecified part of neck of left femur, initial encounter for closed fracture: Secondary | ICD-10-CM | POA: Diagnosis not present

## 2023-02-16 HISTORY — PX: INTRAMEDULLARY (IM) NAIL INTERTROCHANTERIC: SHX5875

## 2023-02-16 LAB — CBC
HCT: 31.5 % — ABNORMAL LOW (ref 36.0–46.0)
Hemoglobin: 10.6 g/dL — ABNORMAL LOW (ref 12.0–15.0)
MCH: 32.6 pg (ref 26.0–34.0)
MCHC: 33.7 g/dL (ref 30.0–36.0)
MCV: 96.9 fL (ref 80.0–100.0)
Platelets: 247 10*3/uL (ref 150–400)
RBC: 3.25 MIL/uL — ABNORMAL LOW (ref 3.87–5.11)
RDW: 12.9 % (ref 11.5–15.5)
WBC: 10.4 10*3/uL (ref 4.0–10.5)
nRBC: 0 % (ref 0.0–0.2)

## 2023-02-16 LAB — BASIC METABOLIC PANEL
Anion gap: 9 (ref 5–15)
BUN: 26 mg/dL — ABNORMAL HIGH (ref 8–23)
CO2: 26 mmol/L (ref 22–32)
Calcium: 8.6 mg/dL — ABNORMAL LOW (ref 8.9–10.3)
Chloride: 103 mmol/L (ref 98–111)
Creatinine, Ser: 1.1 mg/dL — ABNORMAL HIGH (ref 0.44–1.00)
GFR, Estimated: 48 mL/min — ABNORMAL LOW (ref >60)
Glucose, Bld: 105 mg/dL — ABNORMAL HIGH (ref 70–99)
Potassium: 4.2 mmol/L (ref 3.5–5.1)
Sodium: 138 mmol/L (ref 135–145)

## 2023-02-16 LAB — MRSA NEXT GEN BY PCR, NASAL: MRSA by PCR Next Gen: NOT DETECTED

## 2023-02-16 SURGERY — FIXATION, FRACTURE, INTERTROCHANTERIC, WITH INTRAMEDULLARY ROD
Anesthesia: General | Laterality: Left

## 2023-02-16 MED ORDER — CEFAZOLIN SODIUM-DEXTROSE 2-4 GM/100ML-% IV SOLN
2.0000 g | INTRAVENOUS | Status: AC
  Administered 2023-02-16: 2 g via INTRAVENOUS
  Filled 2023-02-16: qty 100

## 2023-02-16 MED ORDER — ACETAMINOPHEN 10 MG/ML IV SOLN
1000.0000 mg | Freq: Once | INTRAVENOUS | Status: AC
  Administered 2023-02-16: 1000 mg via INTRAVENOUS

## 2023-02-16 MED ORDER — ONDANSETRON HCL 4 MG/2ML IJ SOLN
INTRAMUSCULAR | Status: DC | PRN
  Administered 2023-02-16: 4 mg via INTRAVENOUS

## 2023-02-16 MED ORDER — FENTANYL CITRATE PF 50 MCG/ML IJ SOSY: 25.0000 ug | PREFILLED_SYRINGE | INTRAMUSCULAR | Status: DC | PRN

## 2023-02-16 MED ORDER — PHENYLEPHRINE 80 MCG/ML (10ML) SYRINGE FOR IV PUSH (FOR BLOOD PRESSURE SUPPORT)
PREFILLED_SYRINGE | INTRAVENOUS | Status: DC | PRN
  Administered 2023-02-16: 160 ug via INTRAVENOUS
  Administered 2023-02-16 (×2): 80 ug via INTRAVENOUS

## 2023-02-16 MED ORDER — TRANEXAMIC ACID-NACL 1000-0.7 MG/100ML-% IV SOLN
1000.0000 mg | INTRAVENOUS | Status: AC
  Administered 2023-02-16: 1000 mg via INTRAVENOUS
  Filled 2023-02-16: qty 100

## 2023-02-16 MED ORDER — PROPOFOL 10 MG/ML IV BOLUS
INTRAVENOUS | Status: AC
  Filled 2023-02-16: qty 20

## 2023-02-16 MED ORDER — ISOPROPYL ALCOHOL 70 % SOLN
Status: DC | PRN
  Administered 2023-02-16: 1 via TOPICAL

## 2023-02-16 MED ORDER — PHENYLEPHRINE HCL-NACL 20-0.9 MG/250ML-% IV SOLN
INTRAVENOUS | Status: DC | PRN
  Administered 2023-02-16: 40 ug/min via INTRAVENOUS

## 2023-02-16 MED ORDER — FENTANYL CITRATE (PF) 100 MCG/2ML IJ SOLN
INTRAMUSCULAR | Status: AC
  Filled 2023-02-16: qty 2

## 2023-02-16 MED ORDER — CHLORHEXIDINE GLUCONATE 0.12 % MT SOLN
15.0000 mL | Freq: Once | OROMUCOSAL | Status: AC
  Administered 2023-02-16: 15 mL via OROMUCOSAL

## 2023-02-16 MED ORDER — LIDOCAINE HCL (PF) 2 % IJ SOLN
INTRAMUSCULAR | Status: AC
  Filled 2023-02-16: qty 5

## 2023-02-16 MED ORDER — DEXAMETHASONE SODIUM PHOSPHATE 10 MG/ML IJ SOLN
INTRAMUSCULAR | Status: DC | PRN
  Administered 2023-02-16: 10 mg via INTRAVENOUS

## 2023-02-16 MED ORDER — DEXAMETHASONE SODIUM PHOSPHATE 10 MG/ML IJ SOLN
INTRAMUSCULAR | Status: AC
  Filled 2023-02-16: qty 1

## 2023-02-16 MED ORDER — CEFAZOLIN SODIUM-DEXTROSE 2-4 GM/100ML-% IV SOLN
2.0000 g | Freq: Three times a day (TID) | INTRAVENOUS | Status: AC
  Administered 2023-02-17 (×2): 2 g via INTRAVENOUS
  Filled 2023-02-16 (×2): qty 100

## 2023-02-16 MED ORDER — ACETAMINOPHEN 10 MG/ML IV SOLN
INTRAVENOUS | Status: AC
  Filled 2023-02-16: qty 100

## 2023-02-16 MED ORDER — 0.9 % SODIUM CHLORIDE (POUR BTL) OPTIME
TOPICAL | Status: DC | PRN
  Administered 2023-02-16: 1000 mL

## 2023-02-16 MED ORDER — ACETAMINOPHEN 500 MG PO TABS: 1000.0000 mg | ORAL_TABLET | Freq: Once | ORAL | Status: DC

## 2023-02-16 MED ORDER — ONDANSETRON HCL 4 MG/2ML IJ SOLN
INTRAMUSCULAR | Status: AC
  Filled 2023-02-16: qty 2

## 2023-02-16 MED ORDER — PROPOFOL 10 MG/ML IV BOLUS
INTRAVENOUS | Status: DC | PRN
  Administered 2023-02-16: 60 mg via INTRAVENOUS

## 2023-02-16 MED ORDER — LACTATED RINGERS IV SOLN: INTRAVENOUS | Status: DC

## 2023-02-16 MED ORDER — SODIUM CHLORIDE 0.45 % IV SOLN: INTRAVENOUS | Status: AC

## 2023-02-16 MED ORDER — LORAZEPAM 0.5 MG PO TABS: 0.5000 mg | ORAL_TABLET | Freq: Four times a day (QID) | ORAL | Status: DC | PRN

## 2023-02-16 MED ORDER — FENTANYL CITRATE (PF) 100 MCG/2ML IJ SOLN
INTRAMUSCULAR | Status: DC | PRN
  Administered 2023-02-16: 50 ug via INTRAVENOUS

## 2023-02-16 MED ORDER — LABETALOL HCL 5 MG/ML IV SOLN
INTRAVENOUS | Status: DC | PRN
  Administered 2023-02-16: 5 mg via INTRAVENOUS

## 2023-02-16 MED ORDER — SUGAMMADEX SODIUM 500 MG/5ML IV SOLN
INTRAVENOUS | Status: DC | PRN
  Administered 2023-02-16: 150 mg via INTRAVENOUS

## 2023-02-16 MED ORDER — ROCURONIUM BROMIDE 10 MG/ML (PF) SYRINGE
PREFILLED_SYRINGE | INTRAVENOUS | Status: DC | PRN
  Administered 2023-02-16: 60 mg via INTRAVENOUS

## 2023-02-16 MED ORDER — TRANEXAMIC ACID-NACL 1000-0.7 MG/100ML-% IV SOLN: 1000.0000 mg | INTRAVENOUS | Status: AC

## 2023-02-16 SURGICAL SUPPLY — 40 items
BIT DRILL INTERTAN LAG SCREW (BIT) IMPLANT
BIT DRILL SHORT 4.0 (BIT) IMPLANT
BNDG CMPR 5X6 CHSV STRCH STRL (GAUZE/BANDAGES/DRESSINGS) ×1
BNDG COHESIVE 6X5 TAN ST LF (GAUZE/BANDAGES/DRESSINGS) ×1 IMPLANT
CANISTER SUCT 3000ML PPV (MISCELLANEOUS) ×1 IMPLANT
COVER PERINEAL POST (MISCELLANEOUS) ×1 IMPLANT
COVER SURGICAL LIGHT HANDLE (MISCELLANEOUS) ×1 IMPLANT
DRAPE C-ARM 42X120 X-RAY (DRAPES) ×1 IMPLANT
DRAPE STERI IOBAN 125X83 (DRAPES) ×1 IMPLANT
DRILL BIT SHORT 4.0 (BIT) ×2
DRSG TEGADERM 4X4.75 (GAUZE/BANDAGES/DRESSINGS) IMPLANT
DURAPREP 26ML APPLICATOR (WOUND CARE) ×1 IMPLANT
ELECT REM PT RETURN 15FT ADLT (MISCELLANEOUS) ×1 IMPLANT
GAUZE PAD ABD 8X10 STRL (GAUZE/BANDAGES/DRESSINGS) ×1 IMPLANT
GAUZE SPONGE 4X4 12PLY STRL (GAUZE/BANDAGES/DRESSINGS) ×1 IMPLANT
GAUZE XEROFORM 1X8 LF (GAUZE/BANDAGES/DRESSINGS) ×1 IMPLANT
GAUZE XEROFORM 5X9 LF (GAUZE/BANDAGES/DRESSINGS) IMPLANT
GLOVE BIO SURGEON STRL SZ7.5 (GLOVE) ×1 IMPLANT
GLOVE BIOGEL PI IND STRL 7.5 (GLOVE) ×1 IMPLANT
GOWN STRL REUS W/ TWL XL LVL3 (GOWN DISPOSABLE) ×1 IMPLANT
GOWN STRL REUS W/TWL XL LVL3 (GOWN DISPOSABLE) ×1
GUIDE PIN 3.2X343 (PIN) ×2
GUIDE PIN 3.2X343MM (PIN) ×2
GUIDE ROD 3.0 (MISCELLANEOUS) ×1
KIT BASIN OR (CUSTOM PROCEDURE TRAY) ×1 IMPLANT
KIT TURNOVER KIT B (KITS) ×1 IMPLANT
MANIFOLD NEPTUNE II (INSTRUMENTS) ×1 IMPLANT
NAIL TRIGEN INTERTAN 10S 340MM (Orthopedic Implant) IMPLANT
NS IRRIG 1000ML POUR BTL (IV SOLUTION) ×1 IMPLANT
PACK GENERAL/GYN (CUSTOM PROCEDURE TRAY) ×1 IMPLANT
PAD ARMBOARD 7.5X6 YLW CONV (MISCELLANEOUS) ×1 IMPLANT
PIN GUIDE 3.2X343MM (PIN) IMPLANT
ROD GUIDE 3.0 (MISCELLANEOUS) IMPLANT
SCREW LAG COMPR KIT 100/95 (Screw) IMPLANT
SCREW TRIGEN LOW PROF 5.0X30 (Screw) IMPLANT
SCREW TRIGEN LOW PROF 5.0X32.5 (Screw) IMPLANT
SUT VIC AB 0 CT1 18XCR BRD 8 (SUTURE) ×1 IMPLANT
SUT VIC AB 0 CT1 8-18 (SUTURE) ×2
SUT VIC AB 2-0 CT1 18 (SUTURE) ×1 IMPLANT
WATER STERILE IRR 1000ML POUR (IV SOLUTION) ×1 IMPLANT

## 2023-02-16 NOTE — Anesthesia Postprocedure Evaluation (Signed)
Anesthesia Post Note  Patient: Janet [name]  Procedure(s) Performed: INTRAMEDULLARY (IM) NAIL INTERTROCHANTERIC (Left)     Patient location during evaluation: PACU Anesthesia Type: General Level of consciousness: awake and alert and patient cooperative Pain management: pain level controlled Vital Signs Assessment: post-procedure vital signs reviewed and stable Respiratory status: spontaneous breathing, nonlabored ventilation, respiratory function stable and patient connected to nasal cannula oxygen Cardiovascular status: blood pressure returned to baseline and stable Postop Assessment: no apparent nausea or vomiting Anesthetic complications: no   No notable events documented.  Last Vitals:  Vitals:   02/16/23 2105 02/16/23 2115  BP:  (!) 149/53  Pulse: 82 79  Resp: 11 (!) 9  Temp: (!) 36.4 C   SpO2: 100% 98%    Last Pain:  Vitals:   02/16/23 2100  TempSrc:   PainSc: 0-No pain                 Denson Niccoli,E. Marquis Diles

## 2023-02-16 NOTE — Progress Notes (Signed)
[facility] PROGRESS NOTE  Janet [name] (DOB: 1932/11/10) [id number] PCP: Mast, Man X, NP  Brief Narrative: Janet [name] is an 89 y.o. female with a history of dementia, chronic HFpEF, stage III CKD who presented to the ED on 02/15/2023 after a fall at ALF found to have sustained a left hip fracture. Operative management is planned on 3/4.   Subjective: Pt having delirium and pain, some urinary retention but urinated spontaneously when coughing earlier today. Daughter at bedside.  Objective: BP (!) 128/56 (BP Location: Left Arm)   Pulse 87   Temp 98.6 F (37 C)   Resp 18   Ht '5\' 1"'$  (1.549 m)   Wt 69 kg   SpO2 93%   BMI 28.74 kg/m   Gen: Elderly female in no distress Pulm: Clear, nonlabored  CV: RRR, no MRG, trace edema GI: Soft, NT, ND, +BS Neuro: Keeps eyes closed but responds, not oriented. Ext: Warm, dry with shortened and externally rotated LLE. ROM exam not conducted due to known fracture.  Skin: No rashes, lesions or ulcers on visualized skin   Assessment & Plan: Left hip fracture from mechanical fall at ALF: CT head w/atrophy, C spine with degenerative changes. No acute findings on either. - Weight bearing per orthopedics, will plan for operation this evening and PT/OT tomorrow.  - SCDs for VTE ppx, defer further to orthopedics - Pain control as ordered including scheduled tylenol.   Chronic HFpEF: Appears volume down at this time and remains NPO.  - Give IVF until diet can be initiated, then hold both IVF and diuretic for now.   Dementia, depression:  - Continue home medications as tolerated - Delirium precautions reviewed with family, ordered. - prn low dose ativan due to anxiety  Stage IIIb CKD: Based on available values. Cr stable.  - MIVF as above. Monitor BMP in AM.   Normocytic anemia:  - Monitor postoperatively, check anemia panel in AM.   DNR: POA. No artificial feeding or intubation per ACP documents.  - Gold sheet in chart, confirmed with  family.  History of hysterectomy for uterine cancer.   GERD:  - PPI  Patrecia Pour, MD [facility] [email or web address] 02/16/2023, 1:49 PM

## 2023-02-16 NOTE — Plan of Care (Signed)
  Problem: Education: Goal: Knowledge of General Education information will improve Description: Including pain rating scale, medication(s)/side effects and non-pharmacologic comfort measures Outcome: Progressing   Problem: Health Behavior/Discharge Planning: Goal: Ability to manage health-related needs will improve Outcome: Progressing   Problem: Safety: Goal: Ability to remain free from injury will improve Outcome: Progressing   

## 2023-02-16 NOTE — Consult Note (Signed)
Orthopedic Surgery H&P Note  Assessment: Patient is a 89 y.o. female with left, comminuted intertrochanteric femur fracture   Plan: -Operative plans: planning for stabilization tonight -Diet: NPO for procedure -DVT ppx: per primary -Antibiotics: ancef on call to OR -Weight bearing status: NWB LLE -PT/OT evaluate and treat post-operatively -Pain control -Dispo: pending completion of operative plans   Discussed recommendation for operative intervention in the form of left hip cephalomedullary rodding with patient's daughter due to the patient's dementia and lack of capacity. Explained the risks of this procedure included, but were not limited to: nonunion, malunion, hardware failure, infection, bleeding, stiffness, need for additional procedures, deep vein thrombosis, pulmonary embolism, and death. The benefits of this procedure would be to correct her fractures alignment and allow for early ambulation. The alternatives of this surgery would be to treat the fracture with traction or to do no intervention. The daughter's questions questions were answered to her satisfaction. After this discussion, patient elected to proceed with surgery. Informed consent was obtained.   ___________________________________________________________________________   Chief complaint: left hip pain  History:  Patient is a 89 y.o. female who had a ground level fall and noted acute onset of left hip pain. Not having any pain elsewhere. Has not been able to ambulate since the fall. Was living in assisted living and ambulating with a cane.    Physical Exam:  General: no acute distress, appears stated age Neurologic: alert, answering questions appropriately, following commands Cardiovascular: regular rate, no cyanosis Respiratory: unlabored breathing on room air, symmetric chest rise Psychiatric: appropriate affect, normal cadence to speech  MSK:   -Bilateral upper extremities  No tenderness to palpation  over extremity, no gross deformity Fires deltoid, biceps, triceps, wrist extensors, wrist flexors, finger extensors, finger flexors  AIN/PIN/IO intact  Palpable radial pulse  Sensation intact to light touch in median/ulnar/radial/axillary nerve distributions  Hand warm and well perfused, palpable radial pulse  -Right lower extremity  No tenderness to palpation over extremity, except over the left hip, leg is shortened when compared to contralateral side Does not fire hip flexors, quadriceps, hamstrings due to pain. Fires tibialis anterior, gastrocnemius and soleus, extensor hallucis longus Plantarflexes and dorsiflexes toes Sensation intact to light touch in sural, saphenous, tibial, deep peroneal, and superficial peroneal nerve distributions Foot warm and well perfused, palpable DP pulse  -Left lower extremity  No tenderness to palpation over extremity, no gross deformity Fires hip flexors, quadriceps, hamstrings, tibialis anterior, gastrocnemius and soleus, extensor hallucis longus Plantarflexes and dorsiflexes toes Sensation intact to light touch in sural, saphenous, tibial, deep peroneal, and superficial peroneal nerve distributions Foot warm and well perfused, palpable DP pulse  Imaging: XR of the left hip from 02/15/2023 was independently reviewed and interpreted, showing comminuted intertochanteric femur fracture with displacement and varus alignment   Patient name: Janet [name] Patient MRN: [id number] Date: 02/16/23

## 2023-02-16 NOTE — Anesthesia Procedure Notes (Signed)
Procedure Name: Intubation Date/Time: 02/16/2023 5:29 PM  Performed by: Gerald Leitz, CRNAPre-anesthesia Checklist: Patient identified, Patient being monitored, Timeout performed, Emergency Drugs available and Suction available Patient Re-evaluated:Patient Re-evaluated prior to induction Oxygen Delivery Method: Circle system utilized Preoxygenation: Pre-oxygenation with 100% oxygen Induction Type: IV induction Ventilation: Mask ventilation without difficulty Laryngoscope Size: Mac and 3 Grade View: Grade I Tube type: Oral Tube size: 7.0 mm Number of attempts: 1 Airway Equipment and Method: Stylet Placement Confirmation: ETT inserted through vocal cords under direct vision, positive ETCO2 and breath sounds checked- equal and bilateral Secured at: 21 cm Tube secured with: Tape Dental Injury: Teeth and Oropharynx as per pre-operative assessment

## 2023-02-16 NOTE — Op Note (Signed)
Orthopedic Surgery Operative Report   Procedure: Left intertrochanteric fracture intramedullary rodding   Modifier: none   Date of procedure: 02/16/2023   Patient name: Janet [name]   MRN: [id number]  DOB: 21-Jan-1932   Surgeon: Ileene Rubens, MD Assistant: None Pre-operative diagnosis: left intertrochanteric femur fracture Post-operative diagnosis: same as above Findings: left intertrochanteric femur fracture   Specimens: none Anesthesia: general EBL: [id number] Complications: none Pre-incision antibiotic: ancef TXA also given prior to incision   Implants:  Implant Name Type Inv. Item Serial No. Manufacturer Lot No. LRB No. Used Action  NAIL [employer] 340MM - [id number] Orthopedic Implant NAIL [employer] 340MM  [employer] [id number] Left 1 Implanted  SCREW LAG COMPR KIT 100/95 - [id number] Screw SCREW LAG COMPR KIT 100/95  [employer] [id number] Left 1 Implanted  SCREW TRIGEN LOW PROF 5.0X30 - [id number] Screw SCREW TRIGEN LOW PROF 5.0X30  [employer] [id number] Left 1 Implanted  SCREW TRIGEN LOW PROF 5.0X32.5 - [id number] Screw SCREW TRIGEN LOW PROF 5.0X32.5  [employer] [id number] Left 1 Implanted       Indication for procedure: Patient is a 89 y.o. female who presented to the ER after a ground level fall. The patient had left hip pain and x-rays revealed an intertrochanteric femur fracture. The patient was admitted to a medicine service with orthopedics consulted. I met the patient and her POA and discussed the fracture. I recommended operative management in the form of intramedullary rodding to stabilize the fracture and allow for mobilization. Explained the risks of this procedure included, but were not limited to: nonunion, malunion, fixation failure, infection, bleeding, stiffness, need for additional procedures, deep vein thrombosis, pulmonary embolism, MI, arrhythmia, and death. The alternatives of  this surgery would be to treat the fracture with traction or to perform no intervention. After our discussion, patient's POA elected to proceed with surgery.    Procedure Description: The patient was met in the pre-operative holding area. The patient's identity and consent were verified. The operative site was marked by myself. The patient POA's remaining questions about the surgery were answered. The patient was brought back to the operating room. General anesthesia was induced and an endotracheal tube was placed by the anesthesia staff. The patient was transferred to the [facility] table. All bony prominences were well padded. Traction was applied and an attempt at closed reduction with the Hana table was made. Fluoroscopy showed decreased displacement but the proximal fragment was still flexed and the distal fragment was translated slightly medially. The surgical area was cleansed with alcohol. Ancef and TXA were administered by anesthesia. The patient's skin was then prepped and draped in a standard, sterile fashion. A time out was performed that identified the patient, the procedure, and the operative site. All team members agreed with what was stated in the time out.    An incision was made distal to the fracture through the fascia. A bone hook was placed around the femur to translate the distal fragment laterally. An incision was then made over the distal aspect of the greater trochanter through the fascia. A cobb was placed into the incision over the anterior aspect of the proximal femur to reduce the flexion deformity. Once, the fracture was satisfactorily reduced with the cobb and bone hook, decision was made to move on to rod placement.   An incision was made just proximal and inferior to the greater trochanter. The incision was taken sharply down through the fascia.  A guide pin was inserted into the wound onto the top of the greater trochanter. Fluoroscopy was used to place the guide pin at the starting  point at the tip of the greater trochanter and in line with the middle of the femoral neck. The wire was then advanced to a point just past the lesser trochanter. A soft tissue sleeve was advanced over the wire onto the greater trochanter. An entry reamer was used to open the proximal femoral canal under fluoroscopic guidance. The pin and reamer were removed. A long guide wire was placed down the femoral canal. It was advanced to the superior aspect of the patella under fluoroscopy. The length of the nail was estimated off of the guide wire. The nail measure about 36 so a 34 rod was selected. A 46m reamer was inserted over the guidewire and used to ream the femoral canal. It was advanced down past the isthmus under fluoroscopic guidance. The canal was serially reamed with increasing sized reamers until a 175mreamer at which point there was chatter. A 1056mail was selected. The nail was advanced over the guidewire. It was advanced until the lag screws were estimated to end in the center of the femoral head. The guide wire was removed.    An incision was made sharply through the skin, dermis, and fascia over the lateral thigh in the area where the lag screws would be inserted. The lag screw inserter was placed through the jig onto the lateral femoral cortex. A guide wire was advanced through the lag screw inserter into the femoral head under fluoroscopic guidance. It was found to be in acceptable position on the AP and lateral views. The length of the lag screw was estimated off of the guide wire. A 100m22mrew was selected. The inferior lag screw was drilled through the guide. The derotation device was placed through the inserter. The proximal lag screw hole was then drilled over the guide wire. The screw was inserted over the wire under fluoroscopic guidance. The derotation bar was removed and the inferior lag screw was inserted.    The C arm was then brought to the knee in a lateral position to obtain perfect  circles at the distal interlocking holes. An incision was made over the distal interlocking holes on the lateral aspect of the femur. This incision was taken down through fascia. A drill was inserted into the wound and placed over the interlocking hole using perfect circle technique. The hole was then drilled bicortically. A depth gauge was used to estimate the screw length. A [id number] was selected for the more proximal hole. This was inserted and there was good purchase. The same process was then repeated to insert a 32.5mm s38mw in the more distal hole.    Final AP and lateral fluoroscopic images were then taken of the hip, femur, and knee showing satisfactory reduction and placement of the cephalomedullary rod. The wounds were copiously irrigated with sterile saline. The fascia was closed with 0 vicryl. The deep dermal layer was closed with 2-0 vicryl. The skin was closed with staples. Dressings were applied. All counts were correct at the end of the case. Patient was transferred back to a hospital bed. The patient was awakened from anesthesia and brought back to the post-anesthesia care unit in stable condition.     Post-operative plan: The patient will recover in the post-anesthesia care unit and then go to the floor on the medicine service. The patient will receive two post-operative doses  of ancef. The patient will be weight bearing as tolerated. The patient will work with physical therapy. The patient's disposition will be determined by the medicine service.       Ileene Rubens, MD Orthopedic Surgeon

## 2023-02-16 NOTE — Brief Op Note (Signed)
02/16/2023  8:26 PM  PATIENT:  Janet [name]  89 y.o. female  PRE-OPERATIVE DIAGNOSIS:  LEFT INTERTROCHANTRIC FRACTURE  POST-OPERATIVE DIAGNOSIS:  LEFT INTERTROCHANTRIC FRACTURE  PROCEDURE:  Procedure(s): INTRAMEDULLARY (IM) NAIL INTERTROCHANTERIC (Left)  SURGEON:  Surgeon(s) and Role:    * Callie Fielding, MD - Primary  PHYSICIAN ASSISTANT:   ASSISTANTS: none   ANESTHESIA:   general  EBL:  150 mL   BLOOD ADMINISTERED:none  DRAINS: none   LOCAL MEDICATIONS USED:  NONE  SPECIMEN:  No Specimen  DISPOSITION OF SPECIMEN:  N/A  COUNTS:  YES  TOURNIQUET NONE  DICTATION: .Note written in EPIC  PLAN OF CARE: Admit to inpatient   PATIENT DISPOSITION:  PACU - hemodynamically stable.   Delay start of Pharmacological VTE agent (>24hrs) due to surgical blood loss or risk of bleeding: no

## 2023-02-16 NOTE — Anesthesia Preprocedure Evaluation (Addendum)
Anesthesia Evaluation  Patient identified by MRN, date of birth, ID band Patient awake    Reviewed: Allergy & Precautions, NPO status , Patient's Chart, lab work & pertinent test results  History of Anesthesia Complications Negative for: history of anesthetic complications  Airway Mallampati: II  TM Distance: >3 FB Neck ROM: Full    Dental  (+) Dental Advisory Given   Pulmonary neg pulmonary ROS   breath sounds clear to auscultation       Cardiovascular hypertension, Pt. on medications (-) angina  Rhythm:Regular Rate:Normal  '18 ECHO: EF 65% to 70%. Doppler parameters are consistent with abnormal left ventricular relaxation (grade 1 diastolic dysfunction).  - Mitral valve: There was mild regurgitation.     Neuro/Psych    Depression   Dementia Dropped head syndrome: myasthenia gravis has been ruled out Recent difficulty swallowing TIA   GI/Hepatic negative GI ROS, Neg liver ROS,,,  Endo/Other  negative endocrine ROS    Renal/GU Renal InsufficiencyRenal disease     Musculoskeletal   Abdominal   Peds  Hematology  (+) Blood dyscrasia (Hb 10.6), anemia   Anesthesia Other Findings   Reproductive/Obstetrics                             Anesthesia Physical Anesthesia Plan  ASA: 3  Anesthesia Plan: General   Post-op Pain Management: Tylenol PO (pre-op)*   Induction: Intravenous  PONV Risk Score and Plan: 3 and Ondansetron, Dexamethasone and Treatment may vary due to age or medical condition  Airway Management Planned: Oral ETT  Additional Equipment: None  Intra-op Plan:   Post-operative Plan: Extubation in OR  Informed Consent: I have reviewed the patients History and Physical, chart, labs and discussed the procedure including the risks, benefits and alternatives for the proposed anesthesia with the patient or authorized representative who has indicated his/her understanding and  acceptance.   Patient has DNR.  Discussed DNR with power of attorney and Suspend DNR.   Dental advisory given  Plan Discussed with: CRNA and Surgeon  Anesthesia Plan Comments: (Discussed with patient's daughter)        Anesthesia Quick Evaluation

## 2023-02-16 NOTE — Progress Notes (Signed)
With morning medications crushed in two small bites of apple sauce, patient began to cough forcefully and violently. Staff continue to keep bed slanted forward and suction was done with oral swab. It took patient approximately 30 minutes to recover. Patient now resting peacefully. Good color and capillary refill. Dr. Bonner Puna updated on patient's status.  Ivan Anchors, RN 02/16/23 10:00 AM

## 2023-02-16 NOTE — Transfer of Care (Signed)
Immediate Anesthesia Transfer of Care Note  Patient: Janet [name]  Procedure(s) Performed: INTRAMEDULLARY (IM) NAIL INTERTROCHANTERIC (Left)  Patient Location: PACU  Anesthesia Type:General  Level of Consciousness: sedated, patient cooperative, and responds to stimulation  Airway & Oxygen Therapy: Patient Spontanous Breathing and Patient connected to face mask oxygen  Post-op Assessment: Report given to RN and Post -op Vital signs reviewed and stable  Post vital signs: Reviewed and stable  Last Vitals:  Vitals Value Taken Time  BP 167/93 02/16/23 2036  Temp    Pulse 94 02/16/23 2037  Resp 10 02/16/23 2037  SpO2 100 % 02/16/23 2037  Vitals shown include unvalidated device data.  Last Pain:  Vitals:   02/16/23 1320  TempSrc:   PainSc: 5       Patients Stated Pain Goal: Other (Comment) ([id number])  Complications: No notable events documented.

## 2023-02-16 NOTE — Progress Notes (Signed)
Orthopedic Surgery Progress Note   Assessment: Patient is a 89 y.o. female with left intertrochanteric femur fracture status post CMN   Plan: -Operative plans: complete -Diet: regular -DVT ppx: per primary -Antibiotics: ancef x2 post-op doses -Weight bearing status: as tolerated -PT/OT evaluate and treat -Pain control -Dispo: return to floor from PACU  ___________________________________________________________________________  Subjective: No acute events since surgery. Recovering in PACU. Pain adequately controlled.    Physical Exam:  General: no acute distress, appears stated age Neurologic: drowsy, following commands Respiratory: unlabored breathing on supplemental O2, symmetric chest rise  MSK:    -Left lower extremity  Dressing over lateral thigh c/d/i Plantarflexes and dorsiflexes toes EHL/TA/GSC intact Sensation intact to light touch in sural, saphenous, tibial, deep peroneal, and superficial peroneal nerve distributions Foot warm and well perfused, palpable DP pulse   Patient name: Janet [name] Patient MRN: [id number] Date: 02/16/23

## 2023-02-16 NOTE — TOC Initial Note (Signed)
Transition of Care [facility]) - Initial/Assessment Note    Patient Details  Name: Janet [name] MRN: [id number] Date of Birth: Jul 05, 1932  Transition of Care [facility]) CM/SW Contact:    Lennart Pall, LCSW Phone Number: 02/16/2023, 11:48 AM  Clinical Narrative:                 Met with pt and daughter, Eustaquio Maize, to introduce TOC/ CSW role with dc planning needs.  Pt very lethargic and having a lot of pain so daughter providing information.  She confirms that pt is an ALF resident at [facility].  She is hopeful that pt will be able to dc to the SNF rehab area there when medically ready.  Surgery planned for this afternoon.  Have spoken with Susa Griffins with [facility] who confirms a SNF bed will be available for pt.  CSW will follow along and assist with this transition when pt is ready.  Expected Discharge Plan: Skilled Nursing Facility Barriers to Discharge: Continued Medical Work up, Ship broker   Patient Goals and CMS Choice Patient states their goals for this hospitalization and ongoing recovery are:: evetually return to her ALF apt following rehab          Expected Discharge Plan and Services In-house Referral: Clinical Social Work   Post Acute Care Choice: [place] Living arrangements for the past 2 months: Emergency planning/management officer (at [employer])                 DME Arranged: N/A DME Agency: NA                  Prior Living Arrangements/Services Living arrangements for the past 2 months: [place] (at [employer]) Lives with:: Facility Resident Patient language and need for interpreter reviewed:: Yes Do you feel safe going back to the place where you live?: Yes      Need for Family Participation in Patient Care: No (Comment) Care giver support system in place?: Yes (comment)   Criminal Activity/Legal Involvement Pertinent to Current Situation/Hospitalization: No - Comment as needed  Activities of  Daily Living Home Assistive Devices/Equipment: Cane (specify quad or straight), Eyeglasses ADL Screening (condition at time of admission) Patient's cognitive ability adequate to safely complete daily activities?: No Is the patient deaf or have difficulty hearing?: No Does the patient have difficulty seeing, even when wearing glasses/contacts?: No Does the patient have difficulty concentrating, remembering, or making decisions?: Yes Patient able to express need for assistance with ADLs?: Yes Does the patient have difficulty dressing or bathing?: No Independently performs ADLs?: Yes (appropriate for developmental age) Does the patient have difficulty walking or climbing stairs?: Yes Weakness of Legs: Left Weakness of Arms/Hands: None  Permission Sought/Granted Permission sought to share information with : Family Supports, Chartered certified accountant granted to share information with : Yes, Verbal Permission Granted  Share Information with NAME: Ronnie Derby, daughter, @ [phone]  Permission granted to share info w AGENCY: [facility] - Susa Griffins        Emotional Assessment Appearance:: Appears stated age Attitude/Demeanor/Rapport: Lethargic Affect (typically observed): Unable to Assess Orientation: : Oriented to Self Alcohol / Substance Use: Not Applicable Psych Involvement: No (comment)  Admission diagnosis:  Closed left hip fracture ([place]) [S72.002A] Fall, initial encounter [W19.XXXA] Fracture, hip, left, closed, initial encounter [facility] [S72.002A] Patient Active Problem List   Diagnosis Date Noted   Fall 02/16/2023   Closed left hip fracture ([place]) 02/15/2023  Chronic diastolic CHF (congestive heart failure) ([place]) 02/15/2023   Venous dermatitis 08/01/2022   Grade I diastolic dysfunction 0000000   Weight gain 03/28/2022   Osteopenia after menopause 12/26/2021   Moderate late onset Alzheimer's dementia with mood disturbance ([place]) 11/19/2021    Generalized hyperreflexia 11/19/2021   Stooped posture 11/19/2021   Mild peripheral edema 04/03/2021   Vitamin B12 deficiency 07/17/2020   CKD (chronic kidney disease) stage 3, GFR 30-59 ml/min ([place]) 07/17/2020   Vitamin D deficiency 07/17/2020   Hyperlipidemia 12/30/2018   Depression, recurrent ([place]) 12/30/2018   Carpal tunnel syndrome on left 06/02/2017   Chest pain 01/08/2017   Dyspnea 01/08/2017   Senile dementia ([place]) 10/30/2016   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   PCP:  Mast, Man X, NP Pharmacy:   [facility], [place] [address] Phone: [phone] Fax: [place] [id number] [place], [place] - [place] [address] Phone: [phone] Fax: [place], [place] [address] Phone: [phone] Fax: [phone]     Social Determinants of Health ([place]) Social History: SDOH Screenings   Food Insecurity: No Food Insecurity (02/15/2023)  Housing: Low Risk  (02/15/2023)  Transportation Needs: No Transportation Needs (02/15/2023)  Utilities: Not At Risk (02/15/2023)  Tobacco Use: Low Risk  (02/16/2023)   SDOH Interventions: Food Insecurity Interventions: Intervention Not Indicated Housing Interventions: Intervention Not Indicated Transportation Interventions: Intervention Not Indicated Utilities Interventions: Intervention Not Indicated   Readmission Risk Interventions    02/16/2023   11:32 AM  Readmission Risk Prevention Plan  Post Dischage Appt Complete  Medication Screening Complete  Transportation Screening Complete

## 2023-02-17 ENCOUNTER — Inpatient Hospital Stay (HOSPITAL_COMMUNITY): Payer: Medicare PPO

## 2023-02-17 DIAGNOSIS — S72002A Fracture of unspecified part of neck of left femur, initial encounter for closed fracture: Secondary | ICD-10-CM | POA: Diagnosis not present

## 2023-02-17 DIAGNOSIS — M7989 Other specified soft tissue disorders: Secondary | ICD-10-CM

## 2023-02-17 LAB — HEMOGLOBIN AND HEMATOCRIT, BLOOD
HCT: 22.8 % — ABNORMAL LOW (ref 36.0–46.0)
Hemoglobin: 7.6 g/dL — ABNORMAL LOW (ref 12.0–15.0)

## 2023-02-17 LAB — BASIC METABOLIC PANEL
Anion gap: 5 (ref 5–15)
BUN: 24 mg/dL — ABNORMAL HIGH (ref 8–23)
CO2: 24 mmol/L (ref 22–32)
Calcium: 7.6 mg/dL — ABNORMAL LOW (ref 8.9–10.3)
Chloride: 103 mmol/L (ref 98–111)
Creatinine, Ser: 1.29 mg/dL — ABNORMAL HIGH (ref 0.44–1.00)
GFR, Estimated: 40 mL/min — ABNORMAL LOW (ref >60)
Glucose, Bld: 157 mg/dL — ABNORMAL HIGH (ref 70–99)
Potassium: 4.3 mmol/L (ref 3.5–5.1)
Sodium: 132 mmol/L — ABNORMAL LOW (ref 135–145)

## 2023-02-17 LAB — CBC
HCT: 20.4 % — ABNORMAL LOW (ref 36.0–46.0)
Hemoglobin: 6.7 g/dL — CL (ref 12.0–15.0)
MCH: 33 pg (ref 26.0–34.0)
MCHC: 32.8 g/dL (ref 30.0–36.0)
MCV: 100.5 fL — ABNORMAL HIGH (ref 80.0–100.0)
Platelets: 155 10*3/uL (ref 150–400)
RBC: 2.03 MIL/uL — ABNORMAL LOW (ref 3.87–5.11)
RDW: 13.1 % (ref 11.5–15.5)
WBC: 9.9 10*3/uL (ref 4.0–10.5)
nRBC: 0 % (ref 0.0–0.2)

## 2023-02-17 LAB — RETICULOCYTES
Immature Retic Fract: 5.7 % (ref 2.3–15.9)
RBC.: 2.13 MIL/uL — ABNORMAL LOW (ref 3.87–5.11)
Retic Count, Absolute: 38.1 10*3/uL (ref 19.0–186.0)
Retic Ct Pct: 1.8 % (ref 0.4–3.1)

## 2023-02-17 LAB — ABO/RH: ABO/RH(D): A POS

## 2023-02-17 LAB — IRON AND TIBC
Iron: 22 ug/dL — ABNORMAL LOW (ref 28–170)
Saturation Ratios: 8 % — ABNORMAL LOW (ref 10.4–31.8)
TIBC: 265 ug/dL (ref 250–450)
UIBC: 243 ug/dL

## 2023-02-17 LAB — FERRITIN: Ferritin: 45 ng/mL (ref 11–307)

## 2023-02-17 LAB — VITAMIN B12: Vitamin B-12: 672 pg/mL (ref 180–914)

## 2023-02-17 LAB — FOLATE: Folate: 18 ng/mL (ref >5.9)

## 2023-02-17 LAB — PREPARE RBC (CROSSMATCH)

## 2023-02-17 MED ORDER — FUROSEMIDE 20 MG PO TABS
20.0000 mg | ORAL_TABLET | Freq: Once | ORAL | Status: AC
  Administered 2023-02-17: 20 mg via ORAL
  Filled 2023-02-17: qty 1

## 2023-02-17 MED ORDER — CHLORHEXIDINE GLUCONATE CLOTH 2 % EX PADS
6.0000 | MEDICATED_PAD | Freq: Every day | CUTANEOUS | Status: DC
  Administered 2023-02-17: 6 via TOPICAL

## 2023-02-17 MED ORDER — SODIUM CHLORIDE 0.9% IV SOLUTION: Freq: Once | INTRAVENOUS | Status: AC

## 2023-02-17 NOTE — Evaluation (Addendum)
Physical Therapy Evaluation Patient Details Name: Janet [name] MRN: [id number] DOB: 1932-03-19 Today's Date: 02/17/2023  History of Present Illness  Patient is a 89 y.o. female admitted with left intertrochanteric femur fracture. orthopedics consulted and pt is  status post IM nail done on 02/16/23. UE Dopplers negative PMH: Alzheimer's Dementia, diastolic HF, CKD  Clinical Impression  Pt admitted with above diagnosis.  Eval limited d/t pt resistant to imposed movement, unwilling to attempt rolling or sitting EOB despite encouragement from pt son and PT. Will continue attempts to mobilize. Pt will need SNF   Pt currently with functional limitations due to the deficits listed below (see PT Problem List). Pt will benefit from skilled PT to increase their independence and safety with mobility to allow discharge to the venue listed below.          Recommendations for follow up therapy are one component of a multi-disciplinary discharge planning process, led by the attending physician.  Recommendations may be updated based on patient status, additional functional criteria and insurance authorization.  Follow Up Recommendations Skilled nursing-short term rehab (<3 hours/day) Can patient physically be transported by private vehicle: No    Assistance Recommended at Discharge Frequent or constant Supervision/Assistance  Patient can return home with the following  Two people to help with walking and/or transfers;Two people to help with bathing/dressing/bathroom;Assist for transportation;Assistance with cooking/housework;Direct supervision/assist for financial management;Direct supervision/assist for medications management;Help with stairs or ramp for entrance    Equipment Recommendations None recommended by PT  Recommendations for Other Services       Functional Status Assessment Patient has had a recent decline in their functional status and demonstrates the ability to make significant improvements in  function in a reasonable and predictable amount of time.     Precautions / Restrictions Precautions Precautions: Fall Precaution Comments: Premedicate for pain. Restrictions Weight Bearing Restrictions: No Other Position/Activity Restrictions: WBAT      Mobility  Bed Mobility               General bed mobility comments: attempted to roll or reposition; repeatedly offered to sit pt  EOB and pt declined    Transfers                        Ambulation/Gait                  Stairs            Wheelchair Mobility    Modified Rankin (Stroke Patients Only)       Balance Overall balance assessment: History of Falls                                           Pertinent Vitals/Pain      Home Living Family/patient expects to be discharged to:: Skilled nursing facility Living Arrangements: Other (Comment) ([facility] ALF)               Home Equipment: Rolling Walker (2 wheels);Shower seat Additional Comments: pt is a retired Secretary/administrator, worked in [employer], site Prior Level of Function : Needs assist;History of Falls (last six months)  Cognitive Assist : Mobility (cognitive) Mobility (Cognitive): Intermittent cues ADLs (Cognitive): Step by step cues Physical Assist : ADLs (physical)   ADLs (physical): IADLs;Toileting;Dressing;Bathing Mobility Comments: son reports that she was standing on her own and used  a 2 wheeled RW, amb with RW ADLs Comments: Pt having difficulty recalling where she is assistedbut was sure she is assisted with bathing and dressing.     Hand Dominance   Dominant Hand: Left    Extremity/Trunk Assessment   Upper Extremity Assessment Upper Extremity Assessment: Defer to OT evaluation;RUE deficits/detail;Difficult to assess due to impaired cognition;LUE deficits/detail RUE Deficits / Details: AAROM grossly WFL, strength 3/5 at least LUE Deficits / Details: MMT 3+/5, AAROM grossly WFL, grip good     Lower Extremity Assessment Lower Extremity Assessment: Generalized weakness;RLE deficits/detail;LLE deficits/detail RLE Deficits / Details: initiated AROM ankles, resistant to knee/hip movement LLE Deficits / Details: initiated AROM ankles, resistant to knee/hip movement LLE: Unable to fully assess due to pain    Cervical / Trunk Assessment Cervical / Trunk Assessment: Other exceptions Cervical / Trunk Exceptions: positions neck in L lateral flexion, will not allow PT to assist to midline  Communication   Communication: No difficulties  Cognition Arousal/Alertness: Awake/alert Behavior During Therapy: WFL for tasks assessed/performed, Flat affect Overall Cognitive Status: History of cognitive impairments - at baseline Area of Impairment: Following commands, Problem solving                       Following Commands: Follows one step commands inconsistently, Follows multi-step commands inconsistently     Problem Solving: Slow processing, Decreased initiation, Difficulty sequencing, Requires verbal cues General Comments: h/o advanced dementia. Oriented to person, unwilling to work withPT, repeatedly asked to be left alone; son encoruaged also to little avail        General Comments      Exercises     Assessment/Plan    PT Assessment Patient needs continued PT services  PT Problem List Decreased strength;Decreased range of motion;Decreased activity tolerance;Decreased balance;Decreased mobility;Decreased knowledge of precautions;Decreased knowledge of use of DME;Decreased cognition;Pain       PT Treatment Interventions DME instruction;Therapeutic exercise;Functional mobility training;Therapeutic activities;Patient/family education;Balance training;Gait training    PT Goals (Current goals can be found in the Care Plan section)  Acute Rehab PT Goals Patient Stated Goal: none stated-to be left alone; family -rehab PT Goal Formulation: With patient/family Time For  Goal Achievement: 03/03/23 Potential to Achieve Goals: Poor    Frequency Min 2X/week     Co-evaluation               AM-PAC PT "6 Clicks" Mobility  Outcome Measure Help needed turning from your back to your side while in a flat bed without using bedrails?: Total Help needed moving from lying on your back to sitting on the side of a flat bed without using bedrails?: Total Help needed moving to and from a bed to a chair (including a wheelchair)?: Total Help needed standing up from a chair using your arms (e.g., wheelchair or bedside chair)?: Total Help needed to walk in hospital room?: Total Help needed climbing 3-5 steps with a railing? : Total 6 Click Score: 6    End of Session   Activity Tolerance: Patient limited by fatigue;Patient limited by pain;Other (comment) (limited by cognition)     PT Visit Diagnosis: Other abnormalities of gait and mobility (R26.89);Muscle weakness (generalized) (M62.81);History of falling (Z91.81);Difficulty in walking, not elsewhere classified (R26.2)    Time: 1638-1700 PT Time Calculation (min) (ACUTE ONLY): 22 min   Charges:   PT Evaluation $PT Eval Low Complexity: [place], PT  Acute Rehab Dept ([place]) [phone]  [facility] Weekend Pager ([place] only)  [phone]  02/17/2023   [facility] 02/17/2023, 5:25 PM

## 2023-02-17 NOTE — Evaluation (Signed)
Occupational Therapy Evaluation Patient Details Name: Janet [name] MRN: [id number] DOB: 1932-08-03 Today's Date: 02/17/2023   History of Present Illness Patient is a 89 y.o. female with left intertrochanteric femur fracture status post CMN after fall. PMH: Alzheimer's Dementia, diastolic HF, CKD   Clinical Impression   Patient is currently requiring assistance with ADLs including up to total assist of 2 people with bed level Lower body ADLs, up to moderate assist with bed level Upper body ADLs,  as well as inability to tolerate mobilizing to EOB despite increased time and Total Assist to begin moving BLEs to EOB and to roll to RT.  Current level of function is below patient's typical baseline where she does endorse assistance with most ADLs but stands from her bed without help and uses a RW Mod I for ambulation at [facility].  During this evaluation, patient was limited by generalized weakness, impaired activity tolerance, and BLE pain with poor tolerance to any RT or LT leg movement. Palpated underneath RT calf with +Pain sign and pt had positive Homans' sign. RN in room as OT testing and agreed to hold mobility for now and seek Dopplar study of RT LE.  Patient demonstrates fair to good rehab potential, and should benefit from continued skilled occupational therapy services while in acute care to maximize safety, independence and quality of life at home.  Continued occupational therapy services in a SNF setting prior to return home is recommended.  ?     Recommendations for follow up therapy are one component of a multi-disciplinary discharge planning process, led by the attending physician.  Recommendations may be updated based on patient status, additional functional criteria and insurance authorization.   Follow Up Recommendations  Skilled nursing-short term rehab (<3 hours/day)     Assistance Recommended at Discharge Frequent or constant Supervision/Assistance  Patient can return home  with the following A lot of help with bathing/dressing/bathroom;Two people to help with walking and/or transfers;Assist for transportation;Assistance with cooking/housework;Assistance with feeding;Direct supervision/assist for financial management;Direct supervision/assist for medications management    Functional Status Assessment  Patient has had a recent decline in their functional status and demonstrates the ability to make significant improvements in function in a reasonable and predictable amount of time.  Equipment Recommendations  Other (comment) (TBD)    Recommendations for Other Services       Precautions / Restrictions Precautions Precautions: Fall Precaution Comments: Premedicate for pain. Restrictions Weight Bearing Restrictions: No Other Position/Activity Restrictions: WBAT to LLE      Mobility Bed Mobility Overal bed mobility: Needs Assistance Bed Mobility: Rolling Rolling: Total assist, +2 for physical assistance              Transfers                          Balance                                           ADL either performed or assessed with clinical judgement   ADL Overall ADL's : Needs assistance/impaired Eating/Feeding: Minimal assistance Eating/Feeding Details (indicate cue type and reason): Pt refusing lunch. SIL in room and attempting to hand-feed pt. Grooming: Wash/dry hands;Min guard;Bed level   Upper Body Bathing: Minimal assistance;Bed level   Lower Body Bathing: Total assistance;+2 for physical assistance;Bed level   Upper Body Dressing : Minimal  assistance;Sitting   Lower Body Dressing: Total assistance;Bed level;+2 for physical assistance     Toilet Transfer Details (indicate cue type and reason): Pt unable to come to EOB this visit despite increased time, increased pain meds, deep breathing/relaxation techniques. Noted that pt's RIGHT non-surgical leg was giving her pain as well. +Homans's and  positive pain to touch of posterior calf. RN in room and agreed to get dopplar ordered. Held more mobility for now. Toileting- Clothing Manipulation and Hygiene: Total assistance;Bed level Toileting - Clothing Manipulation Details (indicate cue type and reason): Foley cath.     Functional mobility during ADLs: Total assistance General ADL Comments: Total Assist to raise and place each LE and for pt to reach for upper bed rail to roll to RT side, but incomplete roll due to BLE pain.     Vision   Vision Assessment?: No apparent visual deficits     Perception     Praxis      Pertinent Vitals/Pain Pain Assessment Pain Assessment: PAINAD Breathing: occasional labored breathing, short period of hyperventilation Negative Vocalization: occasional moan/groan, low speech, negative/disapproving quality Facial Expression: facial grimacing Body Language: tense, distressed pacing, fidgeting Consolability: distracted or reassured by voice/touch PAINAD Score: 6 Pain Intervention(s): Premedicated before session, RN gave pain meds during session, Limited activity within patient's tolerance, Monitored during session, Relaxation, Repositioned (Premedicated and RN in to room with IV pain meds.)     Hand Dominance Left   Extremity/Trunk Assessment Upper Extremity Assessment Upper Extremity Assessment: Generalized weakness   Lower Extremity Assessment Lower Extremity Assessment: Defer to PT evaluation       Communication Communication Communication: No difficulties   Cognition Arousal/Alertness: Awake/alert Behavior During Therapy: Anxious, WFL for tasks assessed/performed Overall Cognitive Status: History of cognitive impairments - at baseline                                 General Comments: h/o advanced dementia. Oriented to person. Pleasantly confused.     General Comments       Exercises     Shoulder Instructions      Home Living Family/patient expects to be  discharged to:: Skilled nursing facility Living Arrangements:  (Lives at [facility])                           Home Equipment: Conservation officer, nature (2 wheels);Shower seat          Prior Functioning/Environment Prior Level of Function : Needs assist;History of Falls (last six months)  Cognitive Assist : ADLs (cognitive)   ADLs (Cognitive): Step by step cues Physical Assist : ADLs (physical)   ADLs (physical): IADLs;Toileting;Dressing;Bathing Mobility Comments: Pt reports that she was standing on her own and used a 2 wheeled RW "sometimes". ADLs Comments: Pt having difficulty recalling where she is assistedbut was sure she is assisted with bathing and dressing.        OT Problem List: Decreased strength;Pain;Decreased range of motion;Decreased cognition;Increased edema;Decreased safety awareness;Decreased activity tolerance;Impaired balance (sitting and/or standing);Decreased knowledge of use of DME or AE;Decreased knowledge of precautions      OT Treatment/Interventions: Self-care/ADL training;Therapeutic activities;DME and/or AE instruction;Balance training;Patient/family education;Therapeutic exercise    OT Goals(Current goals can be found in the care plan section) Acute Rehab OT Goals Patient Stated Goal: Pain relief OT Goal Formulation: With patient/family Time For Goal Achievement: 03/03/23 Potential to Achieve Goals: Fair ADL Goals  Pt Will Perform Grooming: sitting;with set-up (EOB with at least fair sitting balance) Pt Will Transfer to Toilet: with min assist;stand pivot transfer;bedside commode Pt Will Perform Toileting - Clothing Manipulation and hygiene: with min assist;sitting/lateral leans;sit to/from stand Pt/caregiver will Perform Home Exercise Program: Increased ROM;Increased strength;Both right and left upper extremity Additional ADL Goal #1: Pt will perform bed mobility in preparation of ADLs and functional mobility with no more than Min As.  OT  Frequency: Min 2X/week    Co-evaluation              AM-PAC OT "6 Clicks" Daily Activity     Outcome Measure Help from another person eating meals?: A Lot Help from another person taking care of personal grooming?: A Lot Help from another person toileting, which includes using toliet, bedpan, or urinal?: Total Help from another person bathing (including washing, rinsing, drying)?: Total Help from another person to put on and taking off regular upper body clothing?: A Lot Help from another person to put on and taking off regular lower body clothing?: Total 6 Click Score: 9   End of Session Equipment Utilized During Treatment: Oxygen Nurse Communication: Other (comment) (RN in room to discuss RT LE pain. Dopplar to be ordered)  Activity Tolerance: Patient limited by pain Patient left: in bed;with call bell/phone within reach;with family/visitor present;with nursing/sitter in room  OT Visit Diagnosis: Pain;Other symptoms and signs involving cognitive function;History of falling (Z91.81);Muscle weakness (generalized) (M62.81);Unsteadiness on feet (R26.81) Pain - Right/Left:  (Bil) Pain - part of body: Leg                Time: 1345-1420 OT Time Calculation (min): 35 min Charges:  OT General Charges $OT Visit: 1 Visit OT Evaluation $OT Eval Low Complexity: 1 Low OT Treatments $Therapeutic Activity: 8-22 mins  Anderson [place], OT Acute Rehab Services Office: [phone] 02/17/2023  Julien Girt 02/17/2023, 2:48 PM

## 2023-02-17 NOTE — [place] FL2 (Signed)
[place] MEDICAID FL2 LEVEL OF CARE FORM     IDENTIFICATION  Patient Name: Janet [name] Birthdate: [birth year] Sex: female Admission Date (Current Location): 02/15/2023  [facility] and [place] Number:      Facility and Address:  [facility],  [address], [place]      Provider Number:    Attending Physician Name and Address:  Patrecia Pour, MD  Relative Name and Phone Number:  daughter, Ronnie Derby [phone]    Current Level of Care: Hospital Recommended Level of Care: [place] Prior Approval Number:    Date Approved/Denied:   PASRR Number: pending  Discharge Plan: SNF    Current Diagnoses: Patient Active Problem List   Diagnosis Date Noted   Fall 02/16/2023   Displaced intertrochanteric fracture of left femur, initial encounter for closed fracture ([place]) 02/16/2023   Closed left hip fracture ([place]) 02/15/2023   Chronic diastolic CHF (congestive heart failure) ([place]) 02/15/2023   Venous dermatitis 08/01/2022   Grade I diastolic dysfunction 0000000   Weight gain 03/28/2022   Osteopenia after menopause 12/26/2021   Moderate late onset Alzheimer's dementia with mood disturbance ([place]) 11/19/2021   Generalized hyperreflexia 11/19/2021   Stooped posture 11/19/2021   Mild peripheral edema 04/03/2021   Vitamin B12 deficiency 07/17/2020   CKD (chronic kidney disease) stage 3, GFR 30-59 ml/min ([place]) 07/17/2020   Vitamin D deficiency 07/17/2020   Hyperlipidemia 12/30/2018   Depression, recurrent ([place]) 12/30/2018   Carpal tunnel syndrome on left 06/02/2017   Chest pain 01/08/2017   Dyspnea 01/08/2017   Senile dementia ([place]) 10/30/2016   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    [place] RESPIRATION BLADDER Height & Weight     Self  Normal Continent, External catheter (currently with purewick) Weight: 152 lb 1.9 oz (69 kg) Height:  '5\' 1"'$  (154.9 cm)  BEHAVIORAL SYMPTOMS/MOOD NEUROLOGICAL BOWEL NUTRITION STATUS      Continent  Diet (regular)  AMBULATORY STATUS COMMUNICATION OF NEEDS Skin   Extensive Assist Verbally Other (Comment) (surgical incision only)                       Personal Care Assistance Level of Assistance  Bathing, Dressing Bathing Assistance: Limited assistance   Dressing Assistance: Limited assistance     Functional Limitations Info  Sight, Hearing, Speech Sight Info: Adequate Hearing Info: Impaired Speech Info: Adequate    SPECIAL CARE FACTORS FREQUENCY  OT (By licensed OT), PT (By licensed PT)     PT Frequency: 5x/wk OT Frequency: 5x/wk            Contractures Contractures Info: Not present    Additional Factors Info  Code Status, Allergies, Psychotropic Code Status Info: DNR Allergies Info: Aricept (Donepezil Hcl), Latex, Tramadol Psychotropic Info: see MAR         Current Medications (02/17/2023):  This is the current hospital active medication list Current Facility-Administered Medications  Medication Dose Route Frequency Provider Last Rate Last Admin   acetaminophen (TYLENOL) tablet 650 mg  650 mg Oral Q6H PRN Callie Fielding, MD   650 mg at 02/17/23 0757   Or   acetaminophen (TYLENOL) suppository 650 mg  650 mg Rectal Q6H PRN Callie Fielding, MD       acetaminophen (TYLENOL) tablet 500 mg  500 mg Oral TID Callie Fielding, MD   500 mg at 02/15/23 2336   aspirin EC tablet 81 mg  81 mg Oral Daily Callie Fielding, MD  81 mg at 02/17/23 0945   Chlorhexidine Gluconate Cloth 2 % PADS 6 each  6 each Topical Daily Patrecia Pour, MD   6 each at 02/17/23 1142   cyclobenzaprine (FLEXERIL) tablet 5 mg  5 mg Oral TID PRN Callie Fielding, MD   5 mg at 02/17/23 1441   HYDROmorphone (DILAUDID) injection 1 mg  1 mg Intravenous Q2H PRN Callie Fielding, MD   1 mg at 02/17/23 1412   LORazepam (ATIVAN) tablet 0.5 mg  0.5 mg Oral Q6H PRN Callie Fielding, MD       memantine [facility]) tablet 5 mg  5 mg Oral BID Callie Fielding, MD   5 mg at 02/17/23 0945   mirtazapine  (REMERON) tablet 30 mg  30 mg Oral QHS Callie Fielding, MD   30 mg at 02/16/23 2215   ondansetron (ZOFRAN) tablet 4 mg  4 mg Oral Q6H PRN Callie Fielding, MD       Or   ondansetron [facility]) injection 4 mg  4 mg Intravenous Q6H PRN Callie Fielding, MD       oxyCODONE (Oxy IR/ROXICODONE) immediate release tablet 5 mg  5 mg Oral Q4H PRN Callie Fielding, MD       pantoprazole (PROTONIX) EC tablet 40 mg  40 mg Oral Daily Callie Fielding, MD   40 mg at 02/17/23 0945   rivastigmine (EXELON) capsule 1.5 mg  1.5 mg Oral BID Callie Fielding, MD   1.5 mg at 02/17/23 0946   sertraline (ZOLOFT) tablet 50 mg  50 mg Oral Daily Callie Fielding, MD   50 mg at 02/17/23 0945   tranexamic acid (CYKLOKAPRON) IVPB 1,000 mg  1,000 mg Intravenous To OR Callie Fielding, MD         Discharge Medications: Please see discharge summary for a list of discharge medications.  Relevant Imaging Results:  Relevant Lab Results:   Additional Information SS# [id number]  Lennart Pall, LCSW

## 2023-02-17 NOTE — Discharge Instructions (Addendum)
Orthopedic Surgery Discharge Instructions  Patient name: Janet [name] Fracture: left intertrochanteric femur fracture Procedure Performed: left hip cephalomedullary nail Date of Surgery: 02/16/2023 Surgeon: Ileene Rubens, MD  Activity: You are allowed to put as much weight on your leg as you would like. You can walk as much as you would like. You can perform household activities such as cleaning dishes, doing laundry, vacuuming, etc.  Incision Care: Your incision site has a dressing over it. That dressing should remain in place and dry at all times for a total of one week after surgery. After one week, you can remove the dressing. Underneath the dressing, you will find skin staples. You should leave these staples in place. They will be taken out in the office when the wound has healed. Do not pick, rub, or scrub at them. Do not put cream or lotion over the surgical area. After one week and once the dressing is off, it is okay to let soap and water run over your incision. Again, do not pick, scrub, or rub at the staples when bathing. Do not submerge (e.g., take a bath, swim, go in a hot tub, etc.) until six weeks after surgery. There may be some bloody drainage from the incision into the dressing after surgery. This is normal. You do not need to replace the dressing. Continue to leave it in place for the one week as instructed above. Should the dressing become saturated with blood or drainage, please call the office for further instructions.   Medications: You have been prescribed oxycodone. This is a narcotic pain medication and should only be taken as prescribed. You should not drink alcohol or operate heavy machinery (including driving) while taking this medication. The oxycodone can cause constipation as a side effect. For that reason, you have been prescribed senna and miralax. These are both laxatives. You do not need to take this medication if you develop diarrhea. Should you remain constipated even  while taking the senna and miralax, please use the miralax twice daily. Tylenol has been prescribed to be taken every 8 hours, which will give you additional pain relief.   You have been prescribed aspirin as a blood thinner. This medication is to be taken to prevent blood clots. Take 81 milligrams twice daily. You should refrain from using other blood thinners (warfarin, apixaban, plavix, xarelto, etc.) while using the aspirin. You will need to take this medication for a total of 6 weeks after your surgery.   You should not use over-the-counter NSAIDs (ibuprofen, Aleve, Celebrex, naproxen, meloxicam, etc.) for pain relief because aspirin is a similar medication. There can be side effects including but not limited to kidney injury and ulcers if you take these type of medications with the aspirin.  In order to set expectations for opioid prescriptions, you will only be prescribed opioids for a total of six weeks after surgery and, at two-weeks after surgery, your opioid prescription will start to tapered (decreased dosage and number of pills). If you have ongoing need for opioid medication six weeks after surgery, you will be referred to pain management. If you are already established with a provider that is giving you opioid medications, you should schedule an appointment with them for six weeks after surgery if you feel you are going to need another prescription. State law only allows for opioid prescriptions one week at a time. If you are running out of opioid medication near the end of the week, please call the office during business hours before  running out so I can send you another prescription.   Driving: You should not drive while taking narcotic pain medications. You should start getting back to driving slowly and you may want to try driving in a parking lot before doing anything more.   Diet: You are safe to resume your regular diet after surgery.   Reasons to Call the Office After Surgery: You  should feel free to call the office with any concerns or questions you have in the post-operative period, but you should definitely notify the office if you develop: -shortness of breath, chest pain, or trouble breathing -excessive bleeding, drainage, redness, or swelling around the surgical site -fevers, chills, or pain that is getting worse with each passing day -persistent nausea or vomiting -new weakness in any extremity, new or worsening numbness or tingling in any extremity -numbness in the groin, bowel or bladder incontinence -other concerns about your surgery  Follow Up Appointments: You should call my office below to schedule an appointment at a time/date that is convenient for you at approximately 2 weeks from the date of surgery. The office number and address is listed below.   Office Information:  -Ileene Rubens, MD -Phone number: [phone] -Address: [address]       [place], [place] [address]

## 2023-02-17 NOTE — TOC PASRR Note (Signed)
Transition of Care (TOC) -30 day Note       Patient Details  Name: Mi Chap MRN:  [id number] Date of Birth:  02/17/1932   Transition of Care [facility]) CM/SW Contact  Name:  Lennart Pall, [place] Phone Number:  [phone] Date:  02/17/23 Time:  [id number]   MUST ID: [id number]   To Whom it May Concern:   Please be advised that the above patient will require a short-term nursing home stay, anticipated 30 days or less rehabilitation and strengthening. The plan is for return home.

## 2023-02-17 NOTE — Progress Notes (Signed)
Right upper extremity venous duplex has been completed. Preliminary results can be found in CV Proc through chart review.   02/17/23 10:54 AM Janet [name] RVT

## 2023-02-17 NOTE — Progress Notes (Signed)
[facility] PROGRESS NOTE  Janet [name] (DOB: 31-Mar-1932) [id number] PCP: Mast, Man X, NP  Brief Narrative: Janet [name] is an 89 y.o. female with a history of dementia, chronic HFpEF, stage III CKD who presented to the ED on 02/15/2023 after a fall at ALF found to have sustained a left hip fracture for which she underwent IM nail fixation on 3/4. Postoperative course complicated by anemia for which 1u PRBCs given 3/5.   Subjective: Pain controlled at rest, hasn't worked with PT yet.   Objective: BP (!) 120/50 (BP Location: Left Arm)   Pulse 93   Temp 97.9 F (36.6 C) (Oral)   Resp 18   Ht '5\' 1"'$  (1.549 m)   Wt 69 kg   SpO2 96%   BMI 28.74 kg/m   Gen: No distress, elderly, cooperative Pulm: Clear, nonlabored  CV: RRR, +pitting dependent edema.  GI: Soft, NT, ND, +BS  Neuro: Alert and incompletely oriented.  Ext: RUE nontender edema diffusely without palpable vessel/cords. Warm, dry, good pulses, sensation and motor function intact in LE's. Thigh surgical dressing is c/d/i Skin: No other rashes, lesions or ulcers on visualized skin   Assessment & Plan: Left hip fracture from mechanical fall at ALF: CT head w/atrophy, C spine with degenerative changes. No acute findings on either. - WBAT per orthopedics, PT/OT recommending SNF level of care, TOC working diligently to this end, will return to [facility].  - SCDs for VTE ppx, defer further to orthopedics. She continues her home aspirin '81mg'$  daily. - Pain control as ordered including scheduled tylenol.   Chronic HFpEF:  - Give lasix '20mg'$  po today (home dose which she takes 3x/week) in light of RBC volume.  Dementia, depression:  - Continue home medications as tolerated - Delirium precautions reviewed with family, ordered. - prn low dose ativan due to anxiety  Acute urinary retention: Foley placed 3/4 - Voiding trial in AM.   RUE edema:  - R/o DVT with venous U/S (prelim negative)  Stage IIIb CKD: Based on available  values. Cr relatively stable.  - Monitor in AM  Normocytic anemia with acute blood loss anemia:  - 1u PRBC 3/5, monitor H/H, transfuse for hgb < 7g/dl or symptoms and hgb < 8g/dl. Anemia panel with iron 22, 8% sat. Ferritin 45. Consider iron to assist with replacing stores.   DNR: POA. No artificial feeding or intubation per ACP documents.  - Gold sheet in chart, confirmed with family.  History of hysterectomy for uterine cancer.   GERD:  - PPI  Patrecia Pour, MD [facility] [email or web address] 02/17/2023, 5:36 PM

## 2023-02-17 NOTE — Plan of Care (Signed)
  Problem: Education: Goal: Knowledge of General Education information will improve Description: Including pain rating scale, medication(s)/side effects and non-pharmacologic comfort measures Outcome: Progressing   Problem: Health Behavior/Discharge Planning: Goal: Ability to manage health-related needs will improve Outcome: Progressing   Problem: Clinical Measurements: Goal: Ability to maintain clinical measurements within normal limits will improve Outcome: Progressing Goal: Will remain free from infection Outcome: Progressing Goal: Diagnostic test results will improve Outcome: Progressing Goal: Respiratory complications will improve Outcome: Progressing Goal: Cardiovascular complication will be avoided Outcome: Progressing   Problem: Activity: Goal: Risk for activity intolerance will decrease Outcome: Progressing   Problem: Nutrition: Goal: Adequate nutrition will be maintained Outcome: Progressing   

## 2023-02-17 NOTE — Progress Notes (Signed)
Pre-operative Scores  VAS leg pain: 8 SF-36: not completed due to her mental status/dementia  Callie Fielding, MD Orthopedic Surgeon

## 2023-02-17 NOTE — Progress Notes (Addendum)
CRITICAL VALUE STICKER  CRITICAL VALUE: Hemoglobin 6.7  RECEIVER (on-site recipient of call): Jonn Shingles LPN  DATE & TIME NOTIFIED: 02/16/2023 0747  MD NOTIFIED: Yes  TIME OF NOTIFICATION: [id number]  RESPONSE:  orders added per MD

## 2023-02-17 NOTE — Progress Notes (Signed)
1 unit of PRBC was given. Patient tolerated well. No signs of distress and vitals signs are stable. Will continue plan of care.

## 2023-02-17 NOTE — Evaluation (Signed)
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  Pt has h/o dementia with behavior disturbance, dropped head syndrome and resides at [facility]. Swallow eval ordered as RN reports pt was coughing severely with medicine given with applesauce yesterday.  Daughter and her spouse present during evaluation.  Daughter reports her mom may have had prior swallow eval at [facility] with nonconcerning findings but she does not recall exactly the type of test nor when it occured.  She also later reports her father had Parkinson's and he may have had study instead. SLP requested daughter call [facility] to obtain information re: potential prior swallow eval dates, results and indication for testing. Daughter reports she may call them but "not today" as she says she "has a lot going on".  Per daughter, [name], pt with good intake prior to admission.    Assessment / Plan /  Recommendation  Clinical Impression  Patient presents with clinical indication of oral deficits suspected due to her cognition and potential pharyngeal deficits potentially resulting in infiltration of liquids into pharynx.  Pt needed assist to eat/hold her own cup, etc and has delay in swallow across all boluses.  She is able to clear oral cavity however of minimal intake she would accept- as she only consumed 2 bites of cracker, one bite of applesauce, nectar thick juice x1 ounce, soda x1 sip and water x1 ounce. Consistent throat clearing noted after all liquid swallows - regardless of nectar vs thin.  Given pt was intubated for surgery, hopeful the throat clearing is due to potential edema/sensation, etc s/p intubation or even esophageal related.   Given RN reports pt overtly coughing with medicine with applesauce yesterday despite being fully alert and upright.  Therefore her performance today is encouragingly much improved.  Recommend continue diet with strict precautions. SLP educated pt and her daughter re: recommendations using teach back.  Will follow up to determine if instrumental evaluation indicated. SLP Visit Diagnosis: Dysphagia, oral phase (R13.11);Dysphagia, unspecified (R13.10)    Aspiration Risk  Mild aspiration risk    Diet Recommendation Regular;Thin liquid   Liquid Administration via: Cup;Straw Medication Administration: Whole meds with puree Supervision: Staff to assist with self feeding Compensations: Slow rate;Small sips/bites Postural Changes: Remain upright for at least 30 minutes after po intake;Other (  Comment) (as upright as possible)    Other  Recommendations Oral Care Recommendations: Oral care BID    Recommendations for follow up therapy are one component of a multi-disciplinary discharge planning process, led by the attending physician.  Recommendations may be updated based on patient status, additional functional criteria and insurance authorization.  Follow up  Recommendations No SLP follow up      Assistance Recommended at Discharge    Functional Status Assessment Patient has had a recent decline in their functional status and demonstrates the ability to make significant improvements in function in a reasonable and predictable amount of time.  Frequency and Duration min 1 x/week  1 week       Prognosis Prognosis for improved oropharyngeal function: Fair Barriers to Reach Goals: Cognitive deficits      Swallow Study   General Date of Onset: 02/17/23 HPI: 89 yo female adm to [facility] after having a fall and suffering from a hip fx - s/p repair with intubation intertrochanteric femur fracture status post CMN.  Pt has h/o dementia with behavior disturbance, dropped head syndrome and resides at [facility]. Swallow eval ordered as RN reports pt was coughing severely with medicine given with applesauce yesterday.  Daughter and her spouse present during evaluation.  Daughter reports her mom may have had prior swallow eval at [facility] with nonconcerning findings but she does not recall exactly the type of test nor when it occured.  She also later reports her father had Parkinson's and he may have had study instead. SLP requested daughter call [facility] to obtain information re: potential prior swallow eval dates, results and indication for testing. Daughter reports she may call them but "not today" as she says she "has a lot going on".  Per daughter, [name], pt with good intake prior to admission. Type of Study: Bedside Swallow Evaluation Diet Prior to this Study: Regular;Thin liquids (Level 0) Temperature Spikes Noted: No Respiratory Status: Room air History of Recent Intubation: Yes (for surgery only) Behavior/Cognition: Alert Oral Cavity Assessment: Within Functional Limits Oral Care Completed by SLP: No Oral Cavity - Dentition: Adequate natural dentition Vision: Functional for self-feeding Self-Feeding Abilities: Needs assist Patient Positioning:  Upright in bed Baseline Vocal Quality: Normal Volitional Cough: Cognitively unable to elicit Volitional Swallow: Unable to elicit    Oral/Motor/Sensory Function Overall Oral Motor/Sensory Function: Generalized oral weakness   Ice Chips Ice chips: Not tested   Thin Liquid Thin Liquid: Impaired Presentation: Straw Oral Phase Functional Implications: Oral holding Pharyngeal  Phase Impairments: Suspected delayed Swallow;Throat Clearing - Immediate    Nectar Thick Nectar Thick Liquid: Impaired Oral Phase Impairments: Reduced lingual movement/coordination Oral phase functional implications: Oral holding Pharyngeal Phase Impairments: Suspected delayed Swallow;Throat Clearing - Immediate   Honey Thick Honey Thick Liquid: Not tested   Puree Puree: Impaired Presentation: Spoon Oral Phase Impairments: Reduced lingual movement/coordination Oral Phase Functional Implications: Oral holding Pharyngeal Phase Impairments: Suspected delayed Swallow   Solid     Solid: Impaired Presentation: Self Fed Oral Phase Impairments: Impaired mastication Oral Phase Functional Implications: Oral holding;Prolonged oral transit;Impaired mastication Pharyngeal Phase Impairments: Suspected delayed Swallow      Macario Golds 02/17/2023,2:18 PM Kathleen Lime, MS [facility] [place] Office [phone]

## 2023-02-17 NOTE — Progress Notes (Signed)
Orthopedic Surgery Progress Note   Assessment: Patient is a 89 y.o. female with left intertrochanteric femur fracture status post CMN   Plan: -Operative plans: complete -Diet: regular -DVT ppx: per primary -Antibiotics: ancef x2 post-op doses -Weight bearing status: as tolerated -PT/OT evaluate and treat -Pain control -Dispo: remain floor status  ___________________________________________________________________________  Subjective: No acute events overnight.  Daughter reports she was less restless overnight.  Patient was able to get some sleep last night.  Reports that she is not having any pain in her hip this morning.  Denies paresthesias and numbness.   Physical Exam:  General: no acute distress, appears stated age Neurologic: Alert, intermittently answering questions, following commands Respiratory: unlabored breathing on supplemental O2, symmetric chest rise  MSK:    -Left lower extremity  Dressing over lateral thigh c/d/i Plantarflexes and dorsiflexes toes EHL/TA/GSC intact Sensation intact to light touch in sural, saphenous, tibial, deep peroneal, and superficial peroneal nerve distributions Foot warm and well perfused, palpable DP pulse   Patient name: Janet [name] Patient MRN: [id number] Date: 02/17/23

## 2023-02-18 ENCOUNTER — Encounter (HOSPITAL_COMMUNITY): Payer: Self-pay | Admitting: Orthopedic Surgery

## 2023-02-18 DIAGNOSIS — D62 Acute posthemorrhagic anemia: Secondary | ICD-10-CM | POA: Diagnosis present

## 2023-02-18 DIAGNOSIS — S72142A Displaced intertrochanteric fracture of left femur, initial encounter for closed fracture: Secondary | ICD-10-CM | POA: Diagnosis not present

## 2023-02-18 DIAGNOSIS — R1310 Dysphagia, unspecified: Secondary | ICD-10-CM

## 2023-02-18 DIAGNOSIS — D509 Iron deficiency anemia, unspecified: Secondary | ICD-10-CM | POA: Diagnosis present

## 2023-02-18 DIAGNOSIS — R338 Other retention of urine: Secondary | ICD-10-CM | POA: Diagnosis not present

## 2023-02-18 DIAGNOSIS — D6489 Other specified anemias: Secondary | ICD-10-CM | POA: Diagnosis present

## 2023-02-18 DIAGNOSIS — G301 Alzheimer's disease with late onset: Secondary | ICD-10-CM | POA: Diagnosis not present

## 2023-02-18 DIAGNOSIS — N1831 Chronic kidney disease, stage 3a: Secondary | ICD-10-CM

## 2023-02-18 LAB — BASIC METABOLIC PANEL
Anion gap: 7 (ref 5–15)
BUN: 24 mg/dL — ABNORMAL HIGH (ref 8–23)
CO2: 24 mmol/L (ref 22–32)
Calcium: 7.7 mg/dL — ABNORMAL LOW (ref 8.9–10.3)
Chloride: 103 mmol/L (ref 98–111)
Creatinine, Ser: 1.19 mg/dL — ABNORMAL HIGH (ref 0.44–1.00)
GFR, Estimated: 44 mL/min — ABNORMAL LOW (ref >60)
Glucose, Bld: 127 mg/dL — ABNORMAL HIGH (ref 70–99)
Potassium: 3.6 mmol/L (ref 3.5–5.1)
Sodium: 134 mmol/L — ABNORMAL LOW (ref 135–145)

## 2023-02-18 LAB — CBC
HCT: 22.7 % — ABNORMAL LOW (ref 36.0–46.0)
Hemoglobin: 7.4 g/dL — ABNORMAL LOW (ref 12.0–15.0)
MCH: 32.5 pg (ref 26.0–34.0)
MCHC: 32.6 g/dL (ref 30.0–36.0)
MCV: 99.6 fL (ref 80.0–100.0)
Platelets: 130 10*3/uL — ABNORMAL LOW (ref 150–400)
RBC: 2.28 MIL/uL — ABNORMAL LOW (ref 3.87–5.11)
RDW: 14.1 % (ref 11.5–15.5)
WBC: 10.1 10*3/uL (ref 4.0–10.5)
nRBC: 0 % (ref 0.0–0.2)

## 2023-02-18 LAB — PREPARE RBC (CROSSMATCH)

## 2023-02-18 MED ORDER — ASPIRIN EC 81 MG PO TBEC: 81.0000 mg | DELAYED_RELEASE_TABLET | Freq: Two times a day (BID) | ORAL | 11 refills | Status: DC

## 2023-02-18 MED ORDER — ACETAMINOPHEN 500 MG PO TABS: 1000.0000 mg | ORAL_TABLET | Freq: Three times a day (TID) | ORAL | 0 refills | Status: DC

## 2023-02-18 MED ORDER — SODIUM CHLORIDE 0.9 % IV SOLN
250.0000 mg | Freq: Every day | INTRAVENOUS | Status: DC
  Administered 2023-02-18: 250 mg via INTRAVENOUS
  Filled 2023-02-18 (×2): qty 20

## 2023-02-18 MED ORDER — SODIUM CHLORIDE 0.9% IV SOLUTION: Freq: Once | INTRAVENOUS | Status: AC

## 2023-02-18 MED ORDER — POLYETHYLENE GLYCOL 3350 17 G PO PACK: 17.0000 g | PACK | Freq: Every day | ORAL | 0 refills | Status: DC

## 2023-02-18 MED ORDER — HYDROCODONE-ACETAMINOPHEN 7.5-325 MG/15ML PO SOLN: 15.0000 mL | Freq: Four times a day (QID) | ORAL | 0 refills | Status: DC | PRN

## 2023-02-18 MED ORDER — SENNA 8.6 MG PO TABS: 1.0000 | ORAL_TABLET | Freq: Two times a day (BID) | ORAL | 0 refills | Status: DC

## 2023-02-18 NOTE — Progress Notes (Signed)
Physical Therapy Treatment Patient Details Name: Janet [name] MRN: [id number] DOB: Nov 03, 1932 Today's Date: 02/18/2023   History of Present Illness Patient is a 89 y.o. female admitted with left intertrochanteric femur fracture. orthopedics consulted and pt is status post IM nail done on 02/16/23. UE dopplers negative  PMH: Alzheimer's Dementia, diastolic HF, CKD    PT Comments    The patient is awake. Patient  requires 2 person assist to move to sitting on bed edge. Patient sat x ~ 8 minutes, patient gradually able to   support self at midline and balance.  Patient did not get to a standing position this visit.  Patient's family present. Patient not fully aware of current circumstances other than  her  left leg pain with movement.  Patient will benefit from SNF/rehab, family agreeable.  Recommendations for follow up therapy are one component of a multi-disciplinary discharge planning process, led by the attending physician.  Recommendations may be updated based on patient status, additional functional criteria and insurance authorization.  Follow Up Recommendations  Skilled nursing-short term rehab (<3 hours/day) Can patient physically be transported by private vehicle: No   Assistance Recommended at Discharge Frequent or constant Supervision/Assistance  Patient can return home with the following Two people to help with walking and/or transfers;Two people to help with bathing/dressing/bathroom;Assist for transportation;Assistance with cooking/housework;Direct supervision/assist for financial management;Direct supervision/assist for medications management;Help with stairs or ramp for entrance   Equipment Recommendations  None recommended by PT    Recommendations for Other Services       Precautions / Restrictions Precautions Precautions: Fall Precaution Comments: Premedicate for pain. Restrictions Weight Bearing Restrictions: No LLE Weight Bearing: Weight bearing as tolerated Other  Position/Activity Restrictions: WBAT     Mobility  Bed Mobility Overal bed mobility: Needs Assistance Bed Mobility: Supine to Sit, Sit to Supine     Supine to sit: +2 for safety/equipment, +2 for physical assistance, Total assist Sit to supine: +2 for physical assistance, +2 for safety/equipment, Total assist   General bed mobility comments: patient  did not assist with legs so  both legs moved and bed pad used to  to rotate patient on bed to a siting  position, assist at trunk to sit upright with total assist of 2. Return to supine with total assist of 2.    Transfers Overall transfer level: Needs assistance Equipment used: 2 person hand held assist Transfers: Sit to/from Stand Sit to Stand: +2 physical assistance, Total assist, +2 safety/equipment           General transfer comment: attempted to stand at bed edge, patient  did not get to standing, did not get  to clear bed with buttocks    Ambulation/Gait                   Stairs             Wheelchair Mobility    Modified Rankin (Stroke Patients Only)       Balance Overall balance assessment: History of Falls, Needs assistance Sitting-balance support: Feet supported, No upper extremity supported Sitting balance-Leahy Scale: Poor Sitting balance - Comments: patient  intermittently  able to maintain static balance and reach forward slightly., head positioned forward posture(at baseline per family) Postural control: Posterior lean     Standing balance comment: unable                            Cognition Arousal/Alertness: Awake/alert Behavior During  Therapy: Flat affect Overall Cognitive Status: History of cognitive impairments - at baseline Area of Impairment: Following commands, Problem solving                       Following Commands: Follows one step commands inconsistently, Follows multi-step commands inconsistently       General Comments: frequently stated " I don't  know what is going on. This is not what I want to do", patient's daughter and son in law present        Exercises General Exercises - Upper Extremity Shoulder Flexion: AAROM, Both, 10 reps General Exercises - Lower Extremity Ankle Circles/Pumps: AAROM, Both, 10 reps Long Arc Quad: AAROM, Both, AROM, 5 reps    General Comments        Pertinent Vitals/Pain Pain Assessment Faces Pain Scale: Hurts whole lot Pain Location: left leg, right leg with  movement Pain Descriptors / Indicators: Grimacing, Guarding, Moaning Pain Intervention(s): Monitored during session, Premedicated before session, Limited activity within patient's tolerance, Ice applied    Home Living                          Prior Function            PT Goals (current goals can now be found in the care plan section) Progress towards PT goals: Progressing toward goals    Frequency    Min 2X/week      PT Plan Current plan remains appropriate    Co-evaluation              AM-PAC PT "6 Clicks" Mobility   Outcome Measure  Help needed turning from your back to your side while in a flat bed without using bedrails?: Total Help needed moving from lying on your back to sitting on the side of a flat bed without using bedrails?: Total Help needed moving to and from a bed to a chair (including a wheelchair)?: Total Help needed standing up from a chair using your arms (e.g., wheelchair or bedside chair)?: Total Help needed to walk in hospital room?: Total Help needed climbing 3-5 steps with a railing? : Total 6 Click Score: 6    End of Session   Activity Tolerance: Patient limited by pain Patient left: in bed;with call bell/phone within reach;with bed alarm set;with family/visitor present Nurse Communication: Mobility status;Need for lift equipment PT Visit Diagnosis: Other abnormalities of gait and mobility (R26.89);Muscle weakness (generalized) (M62.81);History of falling (Z91.81);Difficulty in  walking, not elsewhere classified (R26.2)     Time: [id number] PT Time Calculation (min) (ACUTE ONLY): 33 min  Charges:  $Therapeutic Exercise: 8-22 mins $Therapeutic Activity: 8-22 mins                    Tresa Endo PT Acute Rehabilitation Services Office [phone] Weekend [id number]    Claretha Cooper 02/18/2023, 3:31 PM

## 2023-02-18 NOTE — Progress Notes (Signed)
PROGRESS NOTE   Janet [name]  [id number] DOB: 1932/07/03 DOA: 02/15/2023 PCP: Mast, Man X, NP   Date of Service: the patient was seen and examined on 02/18/2023  Brief Narrative:  Janet [name] is an 89 y.o. female with a history of dementia, chronic HFpEF, stage III CKD who presented to the ED on 02/15/2023 after a fall at ALF found to have sustained a left hip fracture for which she underwent IM nail fixation on 3/4. Postoperative course complicated by anemia for which 1u PRBCs given 3/5.      Assessment and Plan: * Displaced intertrochanteric fracture of left femur, initial encounter for closed fracture ([place]) Closed left hip fracture.  Status post left intertrochanteric intramedullary rodding performed 3/4 by Dr. Laurance Flatten Patient continuing to experience substantial pain managed with as needed opiate-based analgesics Patient also receiving scheduled Tylenol Slowly increasing patient's participation with physical therapy Patient will require skilled physical therapy services in a skilled nursing facility  Acute postoperative anemia due to expected blood loss Hemoglobin increased to 7.4 today status post 1 unit packed red blood cell transfusion on 3/5 No substantial evidence of bleeding on exam, suspect deep hematoma adjacent to surgical site Due to continued significant weakness will administer both iron infusion and additional 1 unit of packed red blood cell transfusion Continue to monitor hemoglobin and hematocrit with serial CBCs  Acute urinary retention Patient required Foley catheter placement earlier in the hospitalization which has since been discontinued Patient seems to be voiding spontaneously without issue  Moderate late onset Alzheimer's dementia with mood disturbance ([place]) Longstanding known history of dementia and cognitive deficit Attempting to manage pain as best as possible Minimizing mood altering and sedating agents Encouraging family to remain at bedside is much as  possible Frequent redirection by staff Fall precautions Continue with sertraline, mirtazapine,  rivastigmine and memantine.    Stage 3a chronic kidney disease (CKD) (HCC) Strict intake and output monitoring Creatinine near baseline Minimizing nephrotoxic agents as much as possible Serial chemistries to monitor renal function and electrolytes   Chronic diastolic CHF (congestive heart failure) (HCC) No clinical evidence of cardiogenic volume overload Strict input and output monitoring Daily weights Low-sodium diet   Dysphagia Initial concerns for dysphagia earlier in the hospitalization Speech following, to reassess today with diet orders to follow their recommendations.     Subjective:  Patient unable to answer all questions appropriately due to advanced dementia.  Patient is complaining of continued left hip pain with movement however.  Physical Exam:  Vitals:   02/18/23 0552 02/18/23 1333 02/18/23 1949 02/18/23 2025  BP: (!) 145/56 (!) 104/47 (!) 121/47 (!) 119/49  Pulse: 97 91 92 93  Resp: '15 18 17 16  '$ Temp: 99.5 F (37.5 C) 98.2 F (36.8 C) 99.5 F (37.5 C) 99.8 F (37.7 C)  TempSrc: Oral Oral Oral Oral  SpO2: 96% 90% (!) 89% (!) 89%  Weight:      Height:         Constitutional: Awake alert and oriented x3, no associated distress.   Skin: Increased skin pallor noted. Eyes: Pupils are equally reactive to light.  No evidence of scleral icterus, increased conjunctival pallor noted. ENMT: Moist mucous membranes noted.  Posterior pharynx clear of any exudate or lesions.   Respiratory: clear to auscultation bilaterally, no wheezing, no crackles. Normal respiratory effort. No accessory muscle use.  Cardiovascular: Regular rate and rhythm, no murmurs / rubs / gallops. No extremity edema. 2+ pedal pulses. No carotid bruits.  Abdomen:  Abdomen is soft and nontender.  No evidence of intra-abdominal masses.  Positive bowel sounds noted in all quadrants.    Musculoskeletal: Severe pain of the left hip with both passive and active range of motion.   Normal muscle tone.    Data Reviewed:  I have personally reviewed and interpreted labs, imaging.  Significant findings are   CBC: Recent Labs  Lab 02/15/23 2058 02/16/23 0700 02/17/23 0701 02/17/23 1319 02/18/23 0746  WBC 10.9* 10.4 9.9  --  10.1  NEUTROABS 7.8*  --   --   --   --   HGB 11.9* 10.6* 6.7* 7.6* 7.4*  HCT 35.7* 31.5* 20.4* 22.8* 22.7*  MCV 97.0 96.9 100.5*  --  99.6  PLT 247 247 155  --  [id number]   Basic Metabolic Panel: Recent Labs  Lab 02/15/23 2058 02/16/23 0756 02/17/23 0352 02/18/23 0746  NA 136 138 132* 134*  K 4.2 4.2 4.3 3.6  CL 104 103 103 103  CO2 '24 26 24 24  '$ GLUCOSE 127* 105* 157* 127*  BUN 29* 26* 24* 24*  CREATININE 1.19* 1.10* 1.29* 1.19*  CALCIUM 8.9 8.6* 7.6* 7.7*   GFR: Estimated Creatinine Clearance: 29 mL/min (A) (by C-G formula based on SCr of 1.19 mg/dL (H)). Liver Function Tests: Recent Labs  Lab 02/15/23 2058  AST 24  ALT 21  ALKPHOS 54  BILITOT 0.5  PROT 7.0  ALBUMIN 3.8      Code Status:  DNR.  Code status decision has been confirmed with: daughter at the bedside Family Communication: Daughter is at the bedside and has been updated on plan of care.   Severity of Illness:  The appropriate patient status for this patient is INPATIENT. Inpatient status is judged to be reasonable and necessary in order to provide the required intensity of service to ensure the patient's safety. The patient's presenting symptoms, physical exam findings, and initial radiographic and laboratory data in the context of their chronic comorbidities is felt to place them at high risk for further clinical deterioration. Furthermore, it is not anticipated that the patient will be medically stable for discharge from the hospital within 2 midnights of admission.   * I certify that at the point of admission it is my clinical judgment that the patient will  require inpatient hospital care spanning beyond 2 midnights from the point of admission due to high intensity of service, high risk for further deterioration and high frequency of surveillance required.*  Time spent:  52 minutes  Author:  Vernelle Emerald MD  02/18/2023 10:26 PM

## 2023-02-18 NOTE — NC FL2 (Signed)
[place] MEDICAID FL2 LEVEL OF CARE FORM     IDENTIFICATION  Patient Name: Janet [name] Birthdate: October 16, 1932 Sex: female Admission Date (Current Location): 02/15/2023  [facility] and [place] Number:      Facility and Address:  [facility],  [place], [place]      Provider Number:    Attending Physician Name and Address:  Vernelle Emerald, MD  Relative Name and Phone Number:  daughter, Ronnie Derby [phone]    Current Level of Care: Hospital Recommended Level of Care: [place] Prior Approval Number:    Date Approved/Denied:   PASRR Number: [id number]  Discharge Plan: SNF    Current Diagnoses: Patient Active Problem List   Diagnosis Date Noted   Fall 02/16/2023   Displaced intertrochanteric fracture of left femur, initial encounter for closed fracture ([place]) 02/16/2023   Closed left hip fracture ([place]) 02/15/2023   Chronic diastolic CHF (congestive heart failure) ([place]) 02/15/2023   Venous dermatitis 08/01/2022   Grade I diastolic dysfunction 0000000   Weight gain 03/28/2022   Osteopenia after menopause 12/26/2021   Moderate late onset Alzheimer's dementia with mood disturbance ([place]) 11/19/2021   Generalized hyperreflexia 11/19/2021   Stooped posture 11/19/2021   Mild peripheral edema 04/03/2021   Vitamin B12 deficiency 07/17/2020   CKD (chronic kidney disease) stage 3, GFR 30-59 ml/min (HCC) 07/17/2020   Vitamin D deficiency 07/17/2020   Hyperlipidemia 12/30/2018   Depression, recurrent ([place]) 12/30/2018   Carpal tunnel syndrome on left 06/02/2017   Chest pain 01/08/2017   Dyspnea 01/08/2017   Senile dementia ([place]) 10/30/2016   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    Orientation RESPIRATION BLADDER Height & Weight     Self  Other (Comment) (surgican incision only) Continent, External catheter (currently with purewick) Weight: 152 lb 1.9 oz (69 kg) Height:  '5\' 1"'$  (154.9 cm)  BEHAVIORAL SYMPTOMS/MOOD  NEUROLOGICAL BOWEL NUTRITION STATUS      Continent Diet (regular)  AMBULATORY STATUS COMMUNICATION OF NEEDS Skin   Extensive Assist Verbally Other (Comment) (surgical incision only)                       Personal Care Assistance Level of Assistance  Bathing, Dressing Bathing Assistance: Limited assistance   Dressing Assistance: Limited assistance     Functional Limitations Info  Sight, Hearing, Speech Sight Info: Adequate Hearing Info: Impaired Speech Info: Adequate    SPECIAL CARE FACTORS FREQUENCY  OT (By licensed OT), PT (By licensed PT)     PT Frequency: 5x/wk OT Frequency: 5x/wk            Contractures Contractures Info: Not present    Additional Factors Info  Code Status, Allergies, Psychotropic Code Status Info: DNR Allergies Info: Aricept (Donepezil Hcl), Latex, Tramadol Psychotropic Info: see MAR         Current Medications (02/18/2023):  This is the current hospital active medication list Current Facility-Administered Medications  Medication Dose Route Frequency Provider Last Rate Last Admin   acetaminophen (TYLENOL) tablet 650 mg  650 mg Oral Q6H PRN Callie Fielding, MD   650 mg at 02/17/23 0757   Or   acetaminophen (TYLENOL) suppository 650 mg  650 mg Rectal Q6H PRN Callie Fielding, MD       acetaminophen (TYLENOL) tablet 500 mg  500 mg Oral TID Callie Fielding, MD   500 mg at 02/18/23 [id number]   aspirin EC tablet 81 mg  81 mg Oral Daily Ileene Rubens  A, MD   81 mg at 02/18/23 0823   cyclobenzaprine (FLEXERIL) tablet 5 mg  5 mg Oral TID PRN Callie Fielding, MD   5 mg at 02/18/23 [id number]   HYDROmorphone (DILAUDID) injection 1 mg  1 mg Intravenous Q2H PRN Callie Fielding, MD   1 mg at 02/17/23 1412   LORazepam (ATIVAN) tablet 0.5 mg  0.5 mg Oral Q6H PRN Callie Fielding, MD       memantine [facility]) tablet 5 mg  5 mg Oral BID Callie Fielding, MD   5 mg at 02/18/23 [id number]   mirtazapine (REMERON) tablet 30 mg  30 mg Oral QHS Callie Fielding, MD   30 mg  at 02/17/23 2125   ondansetron (ZOFRAN) tablet 4 mg  4 mg Oral Q6H PRN Callie Fielding, MD       Or   ondansetron [facility]) injection 4 mg  4 mg Intravenous Q6H PRN Callie Fielding, MD       oxyCODONE (Oxy IR/ROXICODONE) immediate release tablet 5 mg  5 mg Oral Q4H PRN Callie Fielding, MD       pantoprazole (PROTONIX) EC tablet 40 mg  40 mg Oral Daily Callie Fielding, MD   40 mg at 02/18/23 [id number]   rivastigmine (EXELON) capsule 1.5 mg  1.5 mg Oral BID Callie Fielding, MD   1.5 mg at 02/18/23 0830   sertraline (ZOLOFT) tablet 50 mg  50 mg Oral Daily Callie Fielding, MD   50 mg at 02/18/23 [id number]     Discharge Medications: Please see discharge summary for a list of discharge medications.  Relevant Imaging Results:  Relevant Lab Results:   Additional Information SS# [id number]  Lennart Pall, LCSW

## 2023-02-18 NOTE — Assessment & Plan Note (Signed)
Patient required Foley catheter placement earlier in the hospitalization which has since been discontinued Patient seems to be voiding spontaneously without issue

## 2023-02-18 NOTE — Care Management Important Message (Signed)
Important Message  Patient Details IM Letter given Name: Janet [name] MRN: [id number] Date of Birth: 1932/08/13   Medicare Important Message Given:  Yes     Kerin Salen 02/18/2023, 11:46 AM

## 2023-02-18 NOTE — Hospital Course (Signed)
Janet [name] is an 89 y.o. female with a history of dementia, chronic HFpEF, stage III CKD who presented to the ED on 02/15/2023 after a fall at ALF found to have sustained a left hip fracture for which she underwent IM nail fixation on 3/4. Postoperative course complicated by anemia for which 1u PRBCs given 3/5.

## 2023-02-18 NOTE — Assessment & Plan Note (Signed)
Hemoglobin increased to 7.4 today status post 1 unit packed red blood cell transfusion on 3/5 No substantial evidence of bleeding on exam, suspect deep hematoma adjacent to surgical site Due to continued significant weakness will administer both iron infusion and additional 1 unit of packed red blood cell transfusion Continue to monitor hemoglobin and hematocrit with serial CBCs

## 2023-02-18 NOTE — Plan of Care (Signed)

## 2023-02-18 NOTE — Progress Notes (Addendum)
Orthopedic Surgery Progress Note   Assessment: Patient is a 89 y.o. female with left intertrochanteric femur fracture status post CMN   Plan: -Operative plans: complete -Has swelling around surgical sites consistent with hematoma, transfuse as needed for hgb<7 -Diet: regular -DVT ppx: per primary -Antibiotics: ancef x2 post-op doses -Weight bearing status: as tolerated -PT/OT evaluate and treat -Pain control -Dispo: per primary  ___________________________________________________________________________  Subjective: Hemoglobin was under 7 yesterday so was transfused one unit and hemoglobin responded appropriately from 6.7 to 7.6. Worked with PT/OT yesterday but did not do too much due to pain. Resting comfortably this morning in bed.    Physical Exam:  General: no acute distress, appears stated age Neurologic: sleeping but awakes to voice, intermittently answering questions, following commands with repeated instruction Respiratory: unlabored breathing on room air, symmetric chest rise  MSK:    -Left lower extremity  Dressing over lateral thigh c/d/i Plantarflexes and dorsiflexes toes EHL/TA/GSC intact Sensation intact to light touch in sural, saphenous, tibial, deep peroneal, and superficial peroneal nerve distributions Foot warm and well perfused, palpable DP pulse   Patient name: Janet [name] Patient MRN: [id number] Date: 02/18/23

## 2023-02-18 NOTE — Assessment & Plan Note (Addendum)
Initial concerns for dysphagia earlier in the hospitalization Speech following, to reassess today with diet orders to follow their recommendations.

## 2023-02-18 NOTE — Progress Notes (Signed)
Speech Language Pathology Treatment: Dysphagia  Patient Details Name: Janet [name] MRN: [id number] DOB: [birth year] Today's Date: 02/18/2023 Time: [id number] SLP Time Calculation (min) (ACUTE ONLY): 30 min  Assessment / Plan / Recommendation Clinical Impression  Pt benefited from maximal tactile cues to take small boluses - even from cup. Throat clearing noted across all liquid trials - even with slightly thicker consistency. She also cleared her throat x1 initially before po trials - causing SLP to ? secretion mixing with po. Pt not aware of her subtle throat clearing and denies dysphagia. RN reports ongoing throat clearing as well with all liquid today. Pt is not aware of this occuring during session- causing SLP to suspect component of chronic deficits. She is managing solids without any difficulties. SLP phoned [facility] and spoke to a nurse requesting call back with information about pt's swallowing prior to admission. Janet [name] *nurse* has worked with pt recently and denies pt having coughing or throat clearing with liquids. Discussed options including MBS or following clinically to assure tolerating - eg not spiking fevers, WBC elevation, congested lung sounds and/or expectoration of colored secretions. All agreeable to plan - as Janet [name] does not want pt to have excessive movement due to pain. Await return call from [place] - - re: pt's prior swallow function.   HPI HPI: 89 yo female adm to [facility] after having a fall and suffering from a hip fx - s/p repair with intubation intertrochanteric femur fracture status post CMN.  Pt has h/o dementia with behavior disturbance, dropped head syndrome and resides at [facility]. Swallow eval ordered as RN reports pt was coughing severely with medicine given with applesauce yesterday.  Janet [name] and her spouse present during evaluation.  Janet [name] reports her mom may have had prior swallow eval at [facility] with nonconcerning findings but she does not recall  exactly the type of test nor when it occured.  She also later reports her father had Parkinson's and he may have had study instead. SLP requested Janet [name] call [facility] to obtain information re: potential prior swallow eval dates, results and indication for testing. Janet [name] reports she may call them but "not today" as she says she "has a lot going on".  Per Janet [name], Janet [name], pt with good intake prior to admission.      SLP Plan  Continue with current plan of care      Recommendations for follow up therapy are one component of a multi-disciplinary discharge planning process, led by the attending physician.  Recommendations may be updated based on patient status, additional functional criteria and insurance authorization.    Recommendations  Diet recommendations: Regular;Thin liquid Liquids provided via: Cup;Straw Medication Administration: Whole meds with puree Supervision: Patient able to self feed Compensations: Slow rate;Small sips/bites Postural Changes and/or Swallow Maneuvers: Seated upright 90 degrees;Upright 30-60 min after meal                Oral Care Recommendations: Oral care BID Follow Up Recommendations: No SLP follow up SLP Visit Diagnosis: Dysphagia, oral phase (R13.11);Dysphagia, unspecified (R13.10) Plan: Continue with current plan of care         Janet Lime, MS [place] Office [phone]   Janet [name]  02/18/2023, 11:33 AM

## 2023-02-18 NOTE — TOC Progression Note (Signed)
Transition of Care [facility]) - Progression Note    Patient Details  Name: Janet [name] MRN: [id number] Date of Birth: March 19, 1932  [facility]) CM/SW Contact  Lennart Pall, LCSW Phone Number: 02/18/2023, 12:59 PM  Clinical Narrative:     Per MD, anticipating pt may be medically ready for SNF discharge tomorrow.  Have spoken with pt and daughter, Janet [name], who are aware and have begun insurance authorization.  [facility]) aware and notes bed is ready.    Expected Discharge Plan: [place] Barriers to Discharge: Continued Medical Work up, Ship broker  Expected Discharge Plan and Services In-house Referral: Clinical Social Work   Post Acute Care Choice: [place] Living arrangements for the past 2 months: [place] (at [employer])                 DME Arranged: N/A DME Agency: NA                   Social Determinants of Health (SDOH) Interventions SDOH Screenings   Food Insecurity: No Food Insecurity (02/15/2023)  Housing: Low Risk  (02/15/2023)  Transportation Needs: No Transportation Needs (02/15/2023)  Utilities: Not At Risk (02/15/2023)  Tobacco Use: Low Risk  (02/16/2023)    Readmission Risk Interventions    02/16/2023   11:32 AM  Readmission Risk Prevention Plan  Post Dischage Appt Complete  Medication Screening Complete  Transportation Screening Complete

## 2023-02-18 NOTE — Assessment & Plan Note (Signed)
Closed left hip fracture.  Status post left intertrochanteric intramedullary rodding performed 3/4 by Dr. Laurance Flatten Patient continuing to experience substantial pain managed with as needed opiate-based analgesics Patient also receiving scheduled Tylenol Slowly increasing patient's participation with physical therapy Patient will require skilled physical therapy services in a skilled nursing facility

## 2023-02-19 DIAGNOSIS — R338 Other retention of urine: Secondary | ICD-10-CM | POA: Diagnosis not present

## 2023-02-19 DIAGNOSIS — G301 Alzheimer's disease with late onset: Secondary | ICD-10-CM | POA: Diagnosis not present

## 2023-02-19 DIAGNOSIS — S72142A Displaced intertrochanteric fracture of left femur, initial encounter for closed fracture: Secondary | ICD-10-CM | POA: Diagnosis not present

## 2023-02-19 DIAGNOSIS — D62 Acute posthemorrhagic anemia: Secondary | ICD-10-CM | POA: Diagnosis not present

## 2023-02-19 LAB — BPAM RBC
Blood Product Expiration Date: 202403272359
Blood Product Expiration Date: 202403282359
ISSUE DATE / TIME: 202403051015
ISSUE DATE / TIME: 202403061955
Unit Type and Rh: 6200
Unit Type and Rh: 6200

## 2023-02-19 LAB — TYPE AND SCREEN
ABO/RH(D): A POS
Antibody Screen: NEGATIVE
Unit division: 0
Unit division: 0

## 2023-02-19 LAB — COMPREHENSIVE METABOLIC PANEL
ALT: 13 U/L (ref 0–44)
AST: 35 U/L (ref 15–41)
Albumin: 2.9 g/dL — ABNORMAL LOW (ref 3.5–5.0)
Alkaline Phosphatase: 49 U/L (ref 38–126)
Anion gap: 7 (ref 5–15)
BUN: 18 mg/dL (ref 8–23)
CO2: 22 mmol/L (ref 22–32)
Calcium: 7.7 mg/dL — ABNORMAL LOW (ref 8.9–10.3)
Chloride: 106 mmol/L (ref 98–111)
Creatinine, Ser: 0.97 mg/dL (ref 0.44–1.00)
GFR, Estimated: 56 mL/min — ABNORMAL LOW (ref >60)
Glucose, Bld: 110 mg/dL — ABNORMAL HIGH (ref 70–99)
Potassium: 3.5 mmol/L (ref 3.5–5.1)
Sodium: 135 mmol/L (ref 135–145)
Total Bilirubin: 0.9 mg/dL (ref 0.3–1.2)
Total Protein: 5.7 g/dL — ABNORMAL LOW (ref 6.5–8.1)

## 2023-02-19 LAB — CBC
HCT: 27.7 % — ABNORMAL LOW (ref 36.0–46.0)
Hemoglobin: 9.2 g/dL — ABNORMAL LOW (ref 12.0–15.0)
MCH: 31.8 pg (ref 26.0–34.0)
MCHC: 33.2 g/dL (ref 30.0–36.0)
MCV: 95.8 fL (ref 80.0–100.0)
Platelets: 153 10*3/uL (ref 150–400)
RBC: 2.89 MIL/uL — ABNORMAL LOW (ref 3.87–5.11)
RDW: 14.2 % (ref 11.5–15.5)
WBC: 10.2 10*3/uL (ref 4.0–10.5)
nRBC: 0.2 % (ref 0.0–0.2)

## 2023-02-19 LAB — MAGNESIUM: Magnesium: 2.6 mg/dL — ABNORMAL HIGH (ref 1.7–2.4)

## 2023-02-19 MED ORDER — POLYETHYLENE GLYCOL 3350 17 G PO PACK: 17.0000 g | PACK | Freq: Every day | ORAL | 0 refills | Status: AC | PRN

## 2023-02-19 MED ORDER — ASPIRIN EC 81 MG PO TBEC: 81.0000 mg | DELAYED_RELEASE_TABLET | Freq: Two times a day (BID) | ORAL | 0 refills | Status: AC

## 2023-02-19 MED ORDER — PANTOPRAZOLE SODIUM 40 MG PO TBEC: 40.0000 mg | DELAYED_RELEASE_TABLET | Freq: Every day | ORAL | Status: DC

## 2023-02-19 MED ORDER — ENSURE SURGERY PO LIQD: 237.0000 mL | Freq: Two times a day (BID) | ORAL | Status: DC

## 2023-02-19 MED ORDER — SENNA 8.6 MG PO TABS: 1.0000 | ORAL_TABLET | Freq: Every day | ORAL | 0 refills | Status: AC

## 2023-02-19 MED ORDER — ENSURE SURGERY PO LIQD
237.0000 mL | Freq: Two times a day (BID) | ORAL | Status: DC
  Administered 2023-02-19: 237 mL via ORAL

## 2023-02-19 NOTE — TOC Transition Note (Signed)
Transition of Care [facility]) - CM/SW Discharge Note   Patient Details  Name: Janet [name] MRN: [id number] Date of Birth: 06-20-1932  Transition of Care [facility]) CM/SW Contact:  Lennart Pall, LCSW Phone Number: 02/19/2023, 2:05 PM   Clinical Narrative:     Have received insurance authorization for [facility] and pt is medically cleared to dc to facility today.  Pt/ daughter aware and agreeable.  PTAR called at 2:00pm.  RN to call report to [phone] or [phone].  No further TOC needs.  Final next level of care: Skilled Nursing Facility Barriers to Discharge: Barriers Resolved   Patient Goals and CMS Choice      Discharge Placement     Existing PASRR number confirmed : 02/18/23          Patient chooses bed at: [facility] Patient to be transferred to facility by: [facility] Name of family member notified: daughter Patient and family notified of of transfer: 02/19/23  Discharge Plan and Services Additional resources added to the After Visit Summary for   In-house Referral: Clinical Social Work   Post Acute Care Choice: [place]          DME Arranged: N/A DME Agency: NA                  Social Determinants of Health (SDOH) Interventions SDOH Screenings   Food Insecurity: No Food Insecurity (02/15/2023)  Housing: Low Risk  (02/15/2023)  Transportation Needs: No Transportation Needs (02/15/2023)  Utilities: Not At Risk (02/15/2023)  Tobacco Use: Low Risk  (02/18/2023)     Readmission Risk Interventions    02/16/2023   11:32 AM  Readmission Risk Prevention Plan  Post Dischage Appt Complete  Medication Screening Complete  Transportation Screening Complete

## 2023-02-19 NOTE — Progress Notes (Signed)
Patient discharged to [facility] via [place]. Family present during transfer. All belongings w/ patient and family. Report given to [place] at [facility].

## 2023-02-19 NOTE — Discharge Summary (Signed)
Physician Discharge Summary   Patient: Janet [name] MRN: [id number] DOB: 1932-02-17  Admit date:     02/15/2023  Discharge date: 02/19/23  Discharge Physician: Vernelle Emerald   PCP: Mast, Man X, NP   Recommendations at discharge:   Patient is DNR Patient is to consume a low-sodium diet.  Patient is to sit upright with meals, is to take small bites and sips and is to receive assistance with all meals Patient may get out of bed with maximum assistance using an assistive device Patient's postsurgical dressing should remain in place for 1 week and afterwards can be removed.  Keep these dressings covered when showering over the course of the first week.  Once the dressings are removed, water may run over the wounds but not submerged until otherwise cleared by outpatient orthopedics. Patient should follow-up with the facility provider as well as follow-up with Dr. Laurance Flatten with orthopedic surgery in approximately 2 weeks.   Discharge Diagnoses: Principal Problem:   Displaced intertrochanteric fracture of left femur, initial encounter for closed fracture ([place]) Active Problems:   Acute postoperative anemia due to expected blood loss   Acute urinary retention   Moderate late onset Alzheimer's dementia with mood disturbance (HCC)   Stage 3a chronic kidney disease (CKD) (HCC)   Chronic diastolic CHF (congestive heart failure) (HCC)   Dysphagia  Resolved Problems:   * No resolved hospital problems. *   Hospital Course: Janet [name] is an 89 y.o. female with a history of Alzheimer's dementia, chronic HFpEF, stage IIIa CKD who presented to the ED on 02/15/2023 after [facility].   Upon evaluation in the [facility] emergency department, workup revealed a displaced intertrochanteric fracture of the left femur.  The hospitalist group was called and patient was admitted to the hospitalist service.  Dr. Laurance Flatten with orthopedic surgery was consulted and patient proceeded with  Left intertrochanteric fracture intramedullary rodding on 3/4.  Postoperative course was complicated by developing hematoma resulting in acute blood loss anemia.  Blood counts dropped to as low as 6.7, down from baseline of 11.9.  Patient received a 1 unit packed red blood cell transfusion on 3/5 and another 1 unit packed red blood cell transfusion on 3/6.  Clinically, there is no evidence of further expansion of the hematoma and hemoglobin stabilized with hemoglobin on date of discharge being 9.2.    Hospitalization was also complicated by concerns over dysphagia in the setting of advanced Alzheimer's dementia.  Speech therapy worked with the patient on multiple occasions throughout the hospitalization.  Patient's ability to swallow seem to improve throughout the hospitalizatio with speech therapy recommending a regular consistency diet with thin liquids prior to discharge.  They recommended appropriate aspiration precautions including sitting patient upright with meals, encouraging small sips and bites and close supervision and assistance with meals.  Hospitalization was also complicated by transient acute urinary retention.  A Foley cather was temporarily required however this catheter was successfully removed with patient passing her spontaneous voiding trial afterwards.  Upon working with physical therapy and Occupational Therapy throughout the hospitalization it was felt the patient would benefit from skilled physical therapy services in a skilled nursing facility.  [facility] and therefore patient was accepted to their skilled side and discharged in improved and stable condition on 02/19/2023.       Pain control - [employer] Controlled Substance Reporting System database was reviewed. and patient was  instructed, not to drive, operate heavy machinery, perform activities at heights, swimming or participation in water activities or provide  baby-sitting services while on Pain, Sleep and Anxiety Medications; until their outpatient Physician has advised to do so again. Also recommended to not to take more than prescribed Pain, Sleep and Anxiety Medications.   Consultants: Dr. Laurance Flatten with orthopedic surgery Procedures performed: Left intertrochanteric fracture intramedullary rodding Disposition: Skilled nursing facility Diet recommendation:  Discharge Diet Orders (From admission, onward)     Start     Ordered   02/19/23 0000  Diet - low sodium heart healthy        02/19/23 1334           Cardiac diet  DISCHARGE MEDICATION: Allergies as of 02/19/2023       Reactions   Aricept [donepezil Hcl]    Muscle cramps   Latex Itching   Tramadol Other (See Comments)   Pt can't remember what side effects she had, she doesn't take it now        Medication List     STOP taking these medications    sodium fluoride 1.1 % Crea dental cream Commonly known as: PREVIDENT 5000 PLUS   UNABLE TO FIND       TAKE these medications    acetaminophen 500 MG tablet Commonly known as: TYLENOL Take 2 tablets (1,000 mg total) by mouth every 8 (eight) hours for 21 days.   aspirin EC 81 MG tablet Take 1 tablet (81 mg total) by mouth 2 (two) times daily. What changed: when to take this   CALCIUM-VITAMIN D PO Take 1 tablet by mouth daily. '600mg'$  calcium, 800 units Vit D   feeding supplement Liqd Take 237 mLs by mouth 2 (two) times daily between meals.   furosemide 20 MG tablet Commonly known as: LASIX Take 20 mg by mouth as directed. Every MWF   HYDROcodone-acetaminophen 7.5-325 mg/15 ml solution Commonly known as: HYCET Take 15 mLs by mouth 4 (four) times daily as needed for moderate pain.   LORazepam 0.5 MG tablet Commonly known as: ATIVAN Take 0.5 mg by mouth daily as needed (dementia).   melatonin 3 MG Tabs tablet Take 3 mg by mouth at bedtime.   memantine 5 MG tablet Commonly known as: NAMENDA Take by mouth 2  (two) times daily.   mirtazapine 30 MG tablet Commonly known as: REMERON TAKE ONE TABLET AT BEDTIME.   pantoprazole 40 MG tablet Commonly known as: PROTONIX Take 1 tablet (40 mg total) by mouth daily. Start taking on: February 20, 2023   polyethylene glycol 17 g packet Commonly known as: MiraLax Take 17 g by mouth daily as needed for up to 14 days.   potassium chloride SA 20 MEQ tablet Commonly known as: KLOR-CON M Take 20 mEq by mouth as directed. Take on MWF   rivastigmine 1.5 MG capsule Commonly known as: EXELON Take 1 capsule (1.5 mg total) by mouth 2 (two) times daily.   senna 8.6 MG Tabs tablet Commonly known as: SENOKOT Take 1 tablet (8.6 mg total) by mouth at bedtime.   S[place] Senior Tabs Take 2 tablets by mouth daily. What changed: Another medication with the same name was removed. Continue taking this medication, and follow the directions you see here.   sertraline 50 MG tablet Commonly known as: ZOLOFT Take 1 tablet (50 mg total) by mouth daily.   vitamin B-12 500 MCG tablet Commonly known as: CYANOCOBALAMIN Take 500 mcg by mouth daily.  Follow-up Information     [place].   Specialty: Skilled Nursing and Faith information: [place] [address] [phone]         Callie Fielding, MD. Schedule an appointment as soon as possible for a visit in 2 week(s).   Specialty: Orthopedic Surgery Contact information: [address] [place] [address] [phone]                 Discharge Exam: Danley Danker Weights   02/15/23 2305  Weight: 69 kg   Constitutional: Awake, alert, disoriented.  Patient is not in any acute distress.   Respiratory: clear to auscultation bilaterally, no wheezing, no crackles. Normal respiratory effort. No accessory muscle use.  Cardiovascular: Regular rate and rhythm, no murmurs / rubs / gallops. No extremity edema. 2+ pedal pulses. No carotid  bruits.  Abdomen: Abdomen is soft and nontender.  No evidence of intra-abdominal masses.  Positive bowel sounds noted in all quadrants.   Musculoskeletal: Pain with both passive and active range of motion of the left hip.      Condition at discharge: fair  The results of significant diagnostics from this hospitalization (including imaging, microbiology, ancillary and laboratory) are listed below for reference.   Imaging Studies: VAS [place] UPPER EXTREMITY VENOUS DUPLEX  Result Date: 02/17/2023 UPPER VENOUS STUDY  Patient Name:  Janet [name]  Date of Exam:   02/17/2023 Medical Rec #: [id number]     Accession #:    [id number] Date of Birth: 1932/10/07      Patient Gender: F Patient Age:   89 years Exam Location:  [facility] Procedure:      VAS [place] UPPER EXTREMITY VENOUS DUPLEX Referring Phys: Vance Gather --------------------------------------------------------------------------------  Indications: Swelling Risk Factors: None identified. Limitations: Poor ultrasound/tissue interface. Comparison Study: No prior studies. Performing Technologist: Oliver Hum RVT  Examination Guidelines: A complete evaluation includes B-mode imaging, spectral Doppler, color Doppler, and power Doppler as needed of all accessible portions of each vessel. Bilateral testing is considered an integral part of a complete examination. Limited examinations for reoccurring indications may be performed as noted.  Right Findings: +----------+------------+---------+-----------+----------+-------+ RIGHT     CompressiblePhasicitySpontaneousPropertiesSummary +----------+------------+---------+-----------+----------+-------+ IJV           Full       Yes       Yes                      +----------+------------+---------+-----------+----------+-------+ Subclavian    Full       Yes       No                       +----------+------------+---------+-----------+----------+-------+ Axillary      Full       Yes       No                        +----------+------------+---------+-----------+----------+-------+ Brachial      Full       Yes       Yes                      +----------+------------+---------+-----------+----------+-------+ Radial        Full                                          +----------+------------+---------+-----------+----------+-------+  Ulnar         Full                                          +----------+------------+---------+-----------+----------+-------+ Cephalic      Full                                          +----------+------------+---------+-----------+----------+-------+ Basilic       Full                                          +----------+------------+---------+-----------+----------+-------+  Left Findings: +----------+------------+---------+-----------+----------+-------+ LEFT      CompressiblePhasicitySpontaneousPropertiesSummary +----------+------------+---------+-----------+----------+-------+ Subclavian    Full       Yes       Yes                      +----------+------------+---------+-----------+----------+-------+  Summary:  Right: No evidence of deep vein thrombosis in the upper extremity. No evidence of superficial vein thrombosis in the upper extremity.  Left: No evidence of thrombosis in the subclavian.  *See table(s) above for measurements and observations.  Diagnosing physician: Harold Barban MD Electronically signed by Harold Barban MD on 02/17/2023 at 10:11:31 PM.    Final    DG FEMUR MIN 2 VIEWS LEFT  Result Date: 02/16/2023 CLINICAL DATA:  Postoperative EXAM: LEFT FEMUR 2 VIEWS COMPARISON:  Left hip x-ray 02/15/2023 FINDINGS: There is a new left femoral intramedullary nail and hip screw fixating comminuted intratrochanteric fracture. Alignment is anatomic. The lesser trochanter is displaced medially. There is no dislocation. There is lateral hip and leg soft tissue swelling, air and skin staples compatible with recent surgery.  IMPRESSION: Left femoral intramedullary nail and hip screw fixating comminuted intratrochanteric fracture. Electronically Signed   By: Ronney Asters M.D.   On: 02/16/2023 21:17   DG FEMUR MIN 2 VIEWS LEFT  Result Date: 02/16/2023 CLINICAL DATA:  Intramedullary nail EXAM: LEFT FEMUR 2 VIEWS COMPARISON:  Hip x-ray 02/15/2023 FINDINGS: Nine intraoperative fluoroscopic views of the left hip. Left-sided hip screw and intramedullary nail fixating intratrochanteric fracture. Alignment is anatomic. Fluoroscopy time: 2 minutes and 56 seconds. Fluoroscopy dose 45.564 micro Byrom. IMPRESSION: Left hip screw and intramedullary nail fixating intratrochanteric fracture. Electronically Signed   By: Ronney Asters M.D.   On: 02/16/2023 20:34   DG C-Arm 1-60 Min-No Report  Result Date: 02/16/2023 Fluoroscopy was utilized by the requesting physician.  No radiographic interpretation.   DG C-Arm 1-60 Min-No Report  Result Date: 02/16/2023 Fluoroscopy was utilized by the requesting physician.  No radiographic interpretation.   DG C-Arm 1-60 Min-No Report  Result Date: 02/16/2023 Fluoroscopy was utilized by the requesting physician.  No radiographic interpretation.   DG Knee 1-2 Views Left  Result Date: 02/16/2023 CLINICAL DATA:  Left knee pain EXAM: LEFT KNEE - 1-2 VIEW COMPARISON:  None Available. FINDINGS: Atypical frontal view due to knee being held in flexion. There is no obvious fracture involving the left knee on these atypical views. There is tricompartment degenerative change and a probable trace effusion. There is soft tissue swelling anteriorly along the proximal tibia and prepatellar soft tissues. IMPRESSION: Atypical frontal view without obvious fracture involving  the left knee. Tricompartment degenerative change with probable trace effusion. Soft tissue swelling anteriorly along the proximal lower leg and prepatellar soft tissues. Electronically Signed   By: Maurine Simmering M.D.   On: 02/16/2023 16:51   DG Chest  Portable 1 View  Result Date: 02/15/2023 CLINICAL DATA:  Hip fracture. EXAM: PORTABLE CHEST 1 VIEW COMPARISON:  Chest radiograph dated May 22, 2007 FINDINGS: The heart is normal in size. Atherosclerotic calcification of the aortic arch. Low lung volumes with left basilar opacity suggesting atelectasis or small effusion. Thoracic spondylosis. No acute osseous abnormality. IMPRESSION: Low lung volumes with left basilar opacity suggesting atelectasis or small effusion. Electronically Signed   By: Keane Police D.O.   On: 02/15/2023 22:07   DG Hip Unilat W or Wo Pelvis 2-3 Views Left  Result Date: 02/15/2023 CLINICAL DATA:  Recent fall with hip pain, initial encounter EXAM: DG HIP (WITH OR WITHOUT PELVIS) 3V LEFT COMPARISON:  None Available. FINDINGS: Pelvic ring appears intact. There is a significantly comminuted left intratrochanteric fracture with impaction and angulation at the fracture site. Femoral head is well seated. No soft tissue abnormality is noted. IMPRESSION: Severely comminuted left intratrochanteric fracture. Electronically Signed   By: Inez Catalina M.D.   On: 02/15/2023 22:05   CT HEAD WO CONTRAST (5MM)  Result Date: 02/15/2023 CLINICAL DATA:  Recent fall with headaches and neck pain, initial encounter EXAM: CT HEAD WITHOUT CONTRAST CT CERVICAL SPINE WITHOUT CONTRAST TECHNIQUE: Multidetector CT imaging of the head and cervical spine was performed following the standard protocol without intravenous contrast. Multiplanar CT image reconstructions of the cervical spine were also generated. RADIATION DOSE REDUCTION: This exam was performed according to the departmental dose-optimization program which includes automated exposure control, adjustment of the mA and/or kV according to patient size and/or use of iterative reconstruction technique. COMPARISON:  05/21/2007 FINDINGS: CT HEAD FINDINGS Brain: No evidence of acute infarction, hemorrhage, hydrocephalus, extra-axial collection or mass lesion/mass  effect. Progressive atrophic changes are noted commenced with the patient's given age. Chronic white matter ischemic changes are seen. Small lacunar infarct in the right basal ganglia is noted. Vascular: No hyperdense vessel or unexpected calcification. Skull: Normal. Negative for fracture or focal lesion. Sinuses/Orbits: No acute finding. Other: None CT CERVICAL SPINE FINDINGS Alignment: Mild loss of normal cervical lordosis is noted. Skull base and vertebrae: 7 cervical segments are well visualized. Vertebral body height is well maintained. Multilevel osteophytic changes are seen. Multilevel disc space narrowing and facet hypertrophic changes are noted. No acute fracture or acute facet abnormality is noted. Soft tissues and spinal canal: Surrounding soft tissue structures demonstrate diffuse vascular calcifications. No hematoma or other focal abnormality is noted. Upper chest: Visualized lung apices are within normal limits. Other: None IMPRESSION: CT of the head: Progressive atrophic and ischemic changes commensurate with the patient's given age. No acute abnormality noted. CT of the cervical spine: Multilevel degenerative change without acute abnormality. Electronically Signed   By: Inez Catalina M.D.   On: 02/15/2023 21:58   CT Cervical Spine Wo Contrast  Result Date: 02/15/2023 CLINICAL DATA:  Recent fall with headaches and neck pain, initial encounter EXAM: CT HEAD WITHOUT CONTRAST CT CERVICAL SPINE WITHOUT CONTRAST TECHNIQUE: Multidetector CT imaging of the head and cervical spine was performed following the standard protocol without intravenous contrast. Multiplanar CT image reconstructions of the cervical spine were also generated. RADIATION DOSE REDUCTION: This exam was performed according to the departmental dose-optimization program which includes automated exposure control, adjustment of the mA and/or  kV according to patient size and/or use of iterative reconstruction technique. COMPARISON:   05/21/2007 FINDINGS: CT HEAD FINDINGS Brain: No evidence of acute infarction, hemorrhage, hydrocephalus, extra-axial collection or mass lesion/mass effect. Progressive atrophic changes are noted commenced with the patient's given age. Chronic white matter ischemic changes are seen. Small lacunar infarct in the right basal ganglia is noted. Vascular: No hyperdense vessel or unexpected calcification. Skull: Normal. Negative for fracture or focal lesion. Sinuses/Orbits: No acute finding. Other: None CT CERVICAL SPINE FINDINGS Alignment: Mild loss of normal cervical lordosis is noted. Skull base and vertebrae: 7 cervical segments are well visualized. Vertebral body height is well maintained. Multilevel osteophytic changes are seen. Multilevel disc space narrowing and facet hypertrophic changes are noted. No acute fracture or acute facet abnormality is noted. Soft tissues and spinal canal: Surrounding soft tissue structures demonstrate diffuse vascular calcifications. No hematoma or other focal abnormality is noted. Upper chest: Visualized lung apices are within normal limits. Other: None IMPRESSION: CT of the head: Progressive atrophic and ischemic changes commensurate with the patient's given age. No acute abnormality noted. CT of the cervical spine: Multilevel degenerative change without acute abnormality. Electronically Signed   By: Inez Catalina M.D.   On: 02/15/2023 21:58    Microbiology: Results for orders placed or performed during the hospital encounter of 02/15/23  MRSA Next Gen by PCR, Nasal     Status: None   Collection Time: 02/16/23  3:45 AM   Specimen: Nasal Mucosa; Nasal Swab  Result Value Ref Range Status   MRSA by PCR Next Gen NOT DETECTED NOT DETECTED Final    Comment: (NOTE) The GeneXpert MRSA Assay (FDA approved for NASAL specimens only), is one component of a comprehensive MRSA colonization surveillance program. It is not intended to diagnose MRSA infection nor to guide or monitor  treatment for MRSA infections. Test performance is not FDA approved in patients less than 20 years old. Performed at [facility], [place] [address] [place], [place] [address]     Labs: CBC: Recent Labs  Lab 02/15/23 2058 02/16/23 0700 02/17/23 0701 02/17/23 1319 02/18/23 0746 02/19/23 0937  WBC 10.9* 10.4 9.9  --  10.1 10.2  NEUTROABS 7.8*  --   --   --   --   --   HGB 11.9* 10.6* 6.7* 7.6* 7.4* 9.2*  HCT 35.7* 31.5* 20.4* 22.8* 22.7* 27.7*  MCV 97.0 96.9 100.5*  --  99.6 95.8  PLT 247 247 155  --  130* 0000000   Basic Metabolic Panel: Recent Labs  Lab 02/15/23 2058 02/16/23 0756 02/17/23 0352 02/18/23 0746 02/19/23 0937  NA 136 138 132* 134* 135  K 4.2 4.2 4.3 3.6 3.5  CL 104 103 103 103 106  CO2 '24 26 24 24 22  '$ GLUCOSE 127* 105* 157* 127* 110*  BUN 29* 26* 24* 24* 18  CREATININE 1.19* 1.10* 1.29* 1.19* 0.97  CALCIUM 8.9 8.6* 7.6* 7.7* 7.7*  MG  --   --   --   --  2.6*   Liver Function Tests: Recent Labs  Lab 02/15/23 2058 02/19/23 0937  AST 24 35  ALT 21 13  ALKPHOS 54 49  BILITOT 0.5 0.9  PROT 7.0 5.7*  ALBUMIN 3.8 2.9*   CBG: No results for input(s): "GLUCAP" in the last 168 hours.  Discharge time spent: greater than 30 minutes.  Signed: Vernelle Emerald, MD [facility] 02/19/2023

## 2023-02-19 NOTE — Progress Notes (Signed)
Orthopedic Surgery Progress Note   Assessment: Patient is a 89 y.o. female with left intertrochanteric femur fracture status post CMN   Plan: -Operative plans: complete -Has swelling around surgical sites consistent with hematoma, transfuse as needed for hgb<7 -Diet: regular -DVT ppx: per primary -Weight bearing status: as tolerated -PT/OT evaluate and treat -Pain control -Dispo: per primary  ___________________________________________________________________________  Subjective: Patient pulled off dressings in the night. No acute events overnight. Patient asking where she is this morning. Not answering questions.    Physical Exam:  General: no acute distress, appears stated age Neurologic: awake, not answering questions, following commands with repeated instruction Respiratory: unlabored breathing on room air, symmetric chest rise  MSK:    -Left lower extremity  Dressing over proximal lateral thigh c/d/I, other dressing have been removed with staples in place and no drainage Plantarflexes and dorsiflexes toes EHL/TA/GSC intact Responds to light touch in sural, saphenous, tibial, deep peroneal, and superficial peroneal nerve distributions Foot warm and well perfused, palpable DP pulse   Patient name: Janet [name] Patient MRN: [id number] Date: 02/19/23

## 2023-02-20 ENCOUNTER — Non-Acute Institutional Stay (SKILLED_NURSING_FACILITY): Payer: Medicare PPO | Admitting: Nurse Practitioner

## 2023-02-20 ENCOUNTER — Other Ambulatory Visit: Payer: Self-pay

## 2023-02-20 ENCOUNTER — Encounter: Payer: Self-pay | Admitting: Nurse Practitioner

## 2023-02-20 DIAGNOSIS — D62 Acute posthemorrhagic anemia: Secondary | ICD-10-CM | POA: Diagnosis not present

## 2023-02-20 DIAGNOSIS — Z8781 Personal history of (healed) traumatic fracture: Secondary | ICD-10-CM | POA: Diagnosis not present

## 2023-02-20 DIAGNOSIS — I5032 Chronic diastolic (congestive) heart failure: Secondary | ICD-10-CM

## 2023-02-20 DIAGNOSIS — F339 Major depressive disorder, recurrent, unspecified: Secondary | ICD-10-CM | POA: Diagnosis not present

## 2023-02-20 DIAGNOSIS — N1831 Chronic kidney disease, stage 3a: Secondary | ICD-10-CM

## 2023-02-20 DIAGNOSIS — F0390 Unspecified dementia without behavioral disturbance: Secondary | ICD-10-CM

## 2023-02-20 MED ORDER — LORAZEPAM 0.5 MG PO TABS: 0.5000 mg | ORAL_TABLET | Freq: Every day | ORAL | 0 refills | Status: DC | PRN

## 2023-02-20 NOTE — Assessment & Plan Note (Signed)
stable, on Mirtazapine, Sertraline, Lorazepam.

## 2023-02-20 NOTE — Assessment & Plan Note (Signed)
transfused, Hgb 9.2 02/19/23, will add Iron '325mg'$  MWF, update CBC/diff.  02/24/23 wbc 11.1, Hgb 9.7, plt 359, neutrophils 73.7, Na 138, K 4.5, Bun 17, creat 0.99

## 2023-02-20 NOTE — Assessment & Plan Note (Signed)
Hospitalized 02/15/23-02/19/23 for left hip fx sustained from a ground fall, ORIF IM nail, pain is managed with Oxycodone, post surgical dressing remains for 1 week, then can be removed, f/u Dr. Laurance Flatten with Sophronia Simas 2 weeks. Taking Tylenol, Norco for pain.

## 2023-02-20 NOTE — Assessment & Plan Note (Signed)
Hx of grade I diastolic dysfunction [id number] per echocardiogram due to SOB, 08/18/22 EF 75%, pulmonary hypertension, mild tricuspid/mitral valve insufficiency, on Furosemide.

## 2023-02-20 NOTE — Progress Notes (Signed)
Location:   [facility] Room Number: NO26A Place of Service:  SNF (31) Provider: Lennie Odor Filomena Pokorney NP  Becca Bayne X, NP  Patient Care Team: Jeaninne Lodico X, NP as PCP - General (Internal Medicine) [name], MD as Referring Physician (Physical Medicine and Rehabilitation) Lajean Manes, MD as Consulting Physician (Internal Medicine)  Extended Emergency Contact Information Primary Emergency Contact: [name], [place] Phone: [phone] Relation: Daughter Secondary Emergency Contact: Myers,Lois  [place] Phone: [phone] Relation: Daughter  Code Status: DNR Goals of care: Advanced Directive information    02/20/2023    2:56 PM  Advanced Directives  Does Patient Have a Medical Advance Directive? Yes  Type of Paramedic of [place];Living will;Out of facility DNR (pink MOST or yellow form)  Does patient want to make changes to medical advance directive? No - Patient declined  Copy of [place] in Chart? Yes - validated most recent copy scanned in chart (See row information)  Pre-existing out of facility DNR order (yellow form or pink MOST form) Yellow form placed in chart (order not valid for inpatient use);Pink MOST form placed in chart (order not valid for inpatient use)     Chief Complaint  Patient presents with   Hospitalization Follow-up    Hospital follow-up    HPI:  Pt is a 89 y.o. female seen today for an acute visit for medication review following hospital stay.   Hospitalized 02/15/23-02/19/23 for left hip fx sustained from a ground fall, ORIF IM nail, pain is managed with Oxycodone, post surgical dressing remains for 1 week, then can be removed, f/u Dr. Laurance Flatten with Sophronia Simas 2 weeks. Taking Tylenol, Norco for pain. Takes ASA bid for VTE prophylaxis.  Post op anemia, transfused, Hgb 9.2 02/19/23, on Iron.  The  patient's swelling ankles/BLE, mostly right,  08/04/22 negative DVT, BNP 51 8/31/2 Hx of grade I diastolic dysfunction [id number] per echocardiogram due to SOB, 08/18/22 EF 75%, pulmonary hypertension, mild tricuspid/mitral valve insufficiency, on Furosemide.  Depression/anxiety, stable, on Mirtazapine, Sertraline, Lorazepam.  Hx of Hyperlipidemia, not taking statin, LDL 64 02/11/22, takes ASA '81mg'$  qd, refused statin. CKD Bun/creat 18/0.97 02/19/23 Vit B12 deficiency, takes Vit B12, Vit B12 1010 03/02/20, Hgb 9.2 02/19/23 Dementia, MMSE 19/30 12/22,  takes Exelon, Memantine, resides [facility] for safety, care assistance. underwent Neurology consultation. Dyspnea, Hx of 0000000, Grade I diastolic dysfunction [id number] per echocardiogram due to SOB. 08/18/22 Echo EF 75%, pulmonary hypertension, mild mitral/tricuspid valves insufficiency.                  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   Dyslipidemia    Fibromyalgia    Granuloma annulare    History of uterine cancer 2003   treated with hysterectomy   Lumbar radicular syndrome    left L5   Macular degeneration    left eye   Sciatica    TIA (transient ischemic attack)    Past Surgical History:  Procedure Laterality Date   ABDOMINAL HYSTERECTOMY     BREAST BIOPSY  2007   CATARACT EXTRACTION     INTRAMEDULLARY (IM) NAIL INTERTROCHANTERIC Left 02/16/2023  Procedure: INTRAMEDULLARY (IM) NAIL INTERTROCHANTERIC;  Surgeon: Callie Fielding, MD;  Location: [facility];  Service: Orthopedics;  Laterality: Left;   TONSILLECTOMY      Allergies  Allergen Reactions   Aricept [Donepezil Hcl]     Muscle cramps   Latex Itching   Tramadol Other (See Comments)    Pt can't remember what side effects she had, she doesn't take it now    Allergies as of 02/20/2023       Reactions   Aricept [donepezil Hcl]     Muscle cramps   Latex Itching   Tramadol Other (See Comments)   Pt can't remember what side effects she had, she doesn't take it now        Medication List        Accurate as of February 20, 2023 11:59 PM. If you have any questions, ask your nurse or doctor.          STOP taking these medications    potassium chloride SA 20 MEQ tablet Commonly known as: KLOR-CON M Stopped by: Alsace Dowd X Quinesha Selinger, NP       TAKE these medications    acetaminophen 500 MG tablet Commonly known as: TYLENOL Take 2 tablets (1,000 mg total) by mouth every 8 (eight) hours for 21 days.   aspirin EC 81 MG tablet Take 1 tablet (81 mg total) by mouth 2 (two) times daily.   CALCIUM-VITAMIN D PO Take 1 tablet by mouth daily. '600mg'$  calcium, 800 units Vit D   feeding supplement Liqd Take 237 mLs by mouth 2 (two) times daily between meals.   ferrous sulfate 325 (65 FE) MG EC tablet Take 325 mg by mouth once. Every Mon, Wed, and Fri   furosemide 20 MG tablet Commonly known as: LASIX Take 20 mg by mouth as directed. Every MWF   HYDROcodone-acetaminophen 7.5-325 mg/15 ml solution Commonly known as: HYCET Take 15 mLs by mouth 4 (four) times daily as needed for moderate pain.   LORazepam 0.5 MG tablet Commonly known as: ATIVAN Take 1 tablet (0.5 mg total) by mouth daily as needed (dementia).   melatonin 3 MG Tabs tablet Take 3 mg by mouth at bedtime.   memantine 5 MG tablet Commonly known as: NAMENDA Take by mouth 2 (two) times daily.   mirtazapine 30 MG tablet Commonly known as: REMERON TAKE ONE TABLET AT BEDTIME.   pantoprazole 40 MG tablet Commonly known as: PROTONIX Take 1 tablet (40 mg total) by mouth daily.   polyethylene glycol 17 g packet Commonly known as: MiraLax Take 17 g by mouth daily as needed for up to 14 days.   rivastigmine 1.5 MG capsule Commonly known as: EXELON Take 1 capsule (1.5 mg total) by mouth 2 (two) times daily.   senna 8.6 MG Tabs tablet Commonly known as:  SENOKOT Take 1 tablet (8.6 mg total) by mouth at bedtime.   Sentry Senior Tabs Take 2 tablets by mouth daily.   sertraline 50 MG tablet Commonly known as: ZOLOFT Take 1 tablet (50 mg total) by mouth daily.   vitamin B-12 500 MCG tablet Commonly known as: CYANOCOBALAMIN Take 500 mcg by mouth daily.        Review of Systems  Constitutional:  Negative for appetite change, fatigue and fever.  HENT:  Positive for hearing loss. Negative for congestion, trouble swallowing and voice change.   Eyes:  Negative for visual disturbance.  Respiratory:  Negative for cough and shortness of breath.        ?  DOE  Cardiovascular:  Positive for leg swelling.  Gastrointestinal:  Negative for abdominal pain and constipation.  Genitourinary:  Negative for dysuria, frequency and urgency.  Musculoskeletal:  Positive for gait problem.  Skin:  Negative for color change.  Neurological:  Negative for speech difficulty and headaches.       Dementia  Psychiatric/Behavioral:  Negative for behavioral problems and sleep disturbance. The patient is nervous/anxious.        Resistance to assistance at times.     Immunization History  Administered Date(s) Administered   Influenza, High Dose Seasonal PF 09/23/2017, 09/28/2019   Influenza,inj,Quad PF,6+ Mos 09/16/2018   Influenza-Unspecified 09/14/2017, 09/26/2020, 10/03/2021, 10/06/2022   Moderna SARS-COV2 Booster Vaccination 10/23/2020, 05/14/2021   Moderna Sars-Covid-2 Vaccination 12/19/2019, 01/16/2020, 05/14/2021   Pneumococcal Conjugate-13 02/21/2014   Pneumococcal Polysaccharide-23 02/21/2002   Pneumococcal-Unspecified 12/15/2001   Tdap 12/15/2009, 10/13/2022   Unspecified SARS-COV-2 Vaccination 10/16/2022   Zoster Recombinat (Shingrix) 12/15/2005   Zoster, Unspecified 12/15/2005   Pertinent  Health Maintenance Due  Topic Date Due   INFLUENZA VACCINE  Completed   DEXA SCAN  Completed      09/21/2018    4:01 PM 12/29/2018    4:11 PM 02/08/2019     1:54 PM 03/07/2020   12:54 PM 02/20/2023    2:52 PM  Fall Risk  Falls in the past year? No 0 0 0 1  Was there an injury with Fall?  0 0  1  Fall Risk Category Calculator  0 0  3  Fall Risk Category (Retired)  Low Low    (RETIRED) Patient Fall Risk Level  Low fall risk Low fall risk    Patient at Risk for Falls Due to     History of fall(s)  Fall risk Follow up     Falls evaluation completed   Functional Status Survey:    Vitals:   02/20/23 1432  BP: 130/70  Pulse: 90  Resp: 16  Temp: 98 F (36.7 C)  SpO2: 93%  Weight: 151 lb 8 oz (68.7 kg)  Height: '5\' 1"'$  (1.549 m)   Body mass index is 28.63 kg/m. Physical Exam Vitals and nursing note reviewed.  Constitutional:      Appearance: Normal appearance.  HENT:     Head: Normocephalic and atraumatic.     Mouth/Throat:     Mouth: Mucous membranes are moist.  Eyes:     Extraocular Movements: Extraocular movements intact.     Conjunctiva/sclera: Conjunctivae normal.     Pupils: Pupils are equal, round, and reactive to light.  Cardiovascular:     Rate and Rhythm: Normal rate and regular rhythm.     Heart sounds: No murmur heard. Pulmonary:     Effort: Pulmonary effort is normal.     Breath sounds: No rales.  Abdominal:     General: Bowel sounds are normal.     Palpations: Abdomen is soft.     Tenderness: There is no abdominal tenderness.  Musculoskeletal:     Cervical back: Normal range of motion and neck supple.     Right lower leg: No edema.     Left lower leg: Edema present.     Comments: Near none  Skin:    General: Skin is warm and dry.     Comments: Left hip surgical wounds are covered in dressing, no blood or drainage seen on dressing, mild swelling left hip/thigh.   Neurological:     General: No focal deficit present.     Mental Status:  She is alert. Mental status is at baseline.     Motor: No weakness.     Coordination: Coordination normal.     Gait: Gait abnormal.     Comments: Oriented to person, place.    Psychiatric:        Mood and Affect: Mood normal.        Behavior: Behavior normal.     Comments: Upon my visit today, conversing, smiling     Labs reviewed: Recent Labs    02/17/23 0352 02/18/23 0746 02/19/23 0937  NA 132* 134* 135  K 4.3 3.6 3.5  CL 103 103 106  CO2 '24 24 22  '$ GLUCOSE 157* 127* 110*  BUN 24* 24* 18  CREATININE 1.29* 1.19* 0.97  CALCIUM 7.6* 7.7* 7.7*  MG  --   --  2.6*   Recent Labs    09/04/22 0000 02/15/23 2058 02/19/23 0937  AST 20 24 35  ALT '20 21 13  '$ ALKPHOS 59 54 49  BILITOT  --  0.5 0.9  PROT  --  7.0 5.7*  ALBUMIN 3.7 3.8 2.9*   Recent Labs    04/02/22 0000 09/04/22 0000 02/15/23 2058 02/16/23 0700 02/17/23 0701 02/17/23 1319 02/18/23 0746 02/19/23 0937  WBC 6.5 5.1 10.9*   < > 9.9  --  10.1 10.2  NEUTROABS 3,829.00 2,938.00 7.8*  --   --   --   --   --   HGB 12.7 12.2 11.9*   < > 6.7* 7.6* 7.4* 9.2*  HCT 37 36 35.7*   < > 20.4* 22.8* 22.7* 27.7*  MCV  --   --  97.0   < > 100.5*  --  99.6 95.8  PLT 270  --  247   < > 155  --  130* 153   < > = values in this interval not displayed.   Lab Results  Component Value Date   TSH 4.58 04/02/2022   No results found for: "HGBA1C" Lab Results  Component Value Date   CHOL 217 (A) 03/29/2021   HDL 48 03/29/2021   LDLCALC 152 03/29/2021   TRIG 70 03/29/2021   CHOLHDL 4.7 08/01/2019    Significant Diagnostic Results in last 30 days:  VAS [place] UPPER EXTREMITY VENOUS DUPLEX  Result Date: 02/17/2023 UPPER VENOUS STUDY  Patient Name:  ANDRAYA KIZER  Date of Exam:   02/17/2023 Medical Rec #: [id number]     Accession #:    [id number] Date of Birth: [birth year]      Patient Gender: F Patient Age:   15 years Exam Location:  [facility] Procedure:      VAS [place] UPPER EXTREMITY VENOUS DUPLEX Referring Phys: Vance Gather --------------------------------------------------------------------------------  Indications: Swelling Risk Factors: None identified. Limitations: Poor ultrasound/tissue interface.  Comparison Study: No prior studies. Performing Technologist: Oliver Hum RVT  Examination Guidelines: A complete evaluation includes B-mode imaging, spectral Doppler, color Doppler, and power Doppler as needed of all accessible portions of each vessel. Bilateral testing is considered an integral part of a complete examination. Limited examinations for reoccurring indications may be performed as noted.  Right Findings: +----------+------------+---------+-----------+----------+-------+ RIGHT     CompressiblePhasicitySpontaneousPropertiesSummary +----------+------------+---------+-----------+----------+-------+ IJV           Full       Yes       Yes                      +----------+------------+---------+-----------+----------+-------+ Subclavian    Full  Yes       No                       +----------+------------+---------+-----------+----------+-------+ Axillary      Full       Yes       No                       +----------+------------+---------+-----------+----------+-------+ Brachial      Full       Yes       Yes                      +----------+------------+---------+-----------+----------+-------+ Radial        Full                                          +----------+------------+---------+-----------+----------+-------+ Ulnar         Full                                          +----------+------------+---------+-----------+----------+-------+ Cephalic      Full                                          +----------+------------+---------+-----------+----------+-------+ Basilic       Full                                          +----------+------------+---------+-----------+----------+-------+  Left Findings: +----------+------------+---------+-----------+----------+-------+ LEFT      CompressiblePhasicitySpontaneousPropertiesSummary +----------+------------+---------+-----------+----------+-------+ Subclavian    Full       Yes        Yes                      +----------+------------+---------+-----------+----------+-------+  Summary:  Right: No evidence of deep vein thrombosis in the upper extremity. No evidence of superficial vein thrombosis in the upper extremity.  Left: No evidence of thrombosis in the subclavian.  *See table(s) above for measurements and observations.  Diagnosing physician: Harold Barban MD Electronically signed by Harold Barban MD on 02/17/2023 at 10:11:31 PM.    Final    DG FEMUR MIN 2 VIEWS LEFT  Result Date: 02/16/2023 CLINICAL DATA:  Postoperative EXAM: LEFT FEMUR 2 VIEWS COMPARISON:  Left hip x-ray 02/15/2023 FINDINGS: There is a new left femoral intramedullary nail and hip screw fixating comminuted intratrochanteric fracture. Alignment is anatomic. The lesser trochanter is displaced medially. There is no dislocation. There is lateral hip and leg soft tissue swelling, air and skin staples compatible with recent surgery. IMPRESSION: Left femoral intramedullary nail and hip screw fixating comminuted intratrochanteric fracture. Electronically Signed   By: Ronney Asters M.D.   On: 02/16/2023 21:17   DG FEMUR MIN 2 VIEWS LEFT  Result Date: 02/16/2023 CLINICAL DATA:  Intramedullary nail EXAM: LEFT FEMUR 2 VIEWS COMPARISON:  Hip x-ray 02/15/2023 FINDINGS: Nine intraoperative fluoroscopic views of the left hip. Left-sided hip screw and intramedullary nail fixating intratrochanteric fracture. Alignment is anatomic. Fluoroscopy time: 2 minutes and 56 seconds. Fluoroscopy dose 45.564 micro Edinger. IMPRESSION: Left hip screw  and intramedullary nail fixating intratrochanteric fracture. Electronically Signed   By: Ronney Asters M.D.   On: 02/16/2023 20:34   DG C-Arm 1-60 Min-No Report  Result Date: 02/16/2023 Fluoroscopy was utilized by the requesting physician.  No radiographic interpretation.   DG C-Arm 1-60 Min-No Report  Result Date: 02/16/2023 Fluoroscopy was utilized by the requesting physician.  No  radiographic interpretation.   DG C-Arm 1-60 Min-No Report  Result Date: 02/16/2023 Fluoroscopy was utilized by the requesting physician.  No radiographic interpretation.   DG Knee 1-2 Views Left  Result Date: 02/16/2023 CLINICAL DATA:  Left knee pain EXAM: LEFT KNEE - 1-2 VIEW COMPARISON:  None Available. FINDINGS: Atypical frontal view due to knee being held in flexion. There is no obvious fracture involving the left knee on these atypical views. There is tricompartment degenerative change and a probable trace effusion. There is soft tissue swelling anteriorly along the proximal tibia and prepatellar soft tissues. IMPRESSION: Atypical frontal view without obvious fracture involving the left knee. Tricompartment degenerative change with probable trace effusion. Soft tissue swelling anteriorly along the proximal lower leg and prepatellar soft tissues. Electronically Signed   By: Maurine Simmering M.D.   On: 02/16/2023 16:51   DG Chest Portable 1 View  Result Date: 02/15/2023 CLINICAL DATA:  Hip fracture. EXAM: PORTABLE CHEST 1 VIEW COMPARISON:  Chest radiograph dated May 22, 2007 FINDINGS: The heart is normal in size. Atherosclerotic calcification of the aortic arch. Low lung volumes with left basilar opacity suggesting atelectasis or small effusion. Thoracic spondylosis. No acute osseous abnormality. IMPRESSION: Low lung volumes with left basilar opacity suggesting atelectasis or small effusion. Electronically Signed   By: Keane Police D.O.   On: 02/15/2023 22:07   DG Hip Unilat W or Wo Pelvis 2-3 Views Left  Result Date: 02/15/2023 CLINICAL DATA:  Recent fall with hip pain, initial encounter EXAM: DG HIP (WITH OR WITHOUT PELVIS) 3V LEFT COMPARISON:  None Available. FINDINGS: Pelvic ring appears intact. There is a significantly comminuted left intratrochanteric fracture with impaction and angulation at the fracture site. Femoral head is well seated. No soft tissue abnormality is noted. IMPRESSION: Severely  comminuted left intratrochanteric fracture. Electronically Signed   By: Inez Catalina M.D.   On: 02/15/2023 22:05   CT HEAD WO CONTRAST (5MM)  Result Date: 02/15/2023 CLINICAL DATA:  Recent fall with headaches and neck pain, initial encounter EXAM: CT HEAD WITHOUT CONTRAST CT CERVICAL SPINE WITHOUT CONTRAST TECHNIQUE: Multidetector CT imaging of the head and cervical spine was performed following the standard protocol without intravenous contrast. Multiplanar CT image reconstructions of the cervical spine were also generated. RADIATION DOSE REDUCTION: This exam was performed according to the departmental dose-optimization program which includes automated exposure control, adjustment of the mA and/or kV according to patient size and/or use of iterative reconstruction technique. COMPARISON:  05/21/2007 FINDINGS: CT HEAD FINDINGS Brain: No evidence of acute infarction, hemorrhage, hydrocephalus, extra-axial collection or mass lesion/mass effect. Progressive atrophic changes are noted commenced with the patient's given age. Chronic white matter ischemic changes are seen. Small lacunar infarct in the right basal ganglia is noted. Vascular: No hyperdense vessel or unexpected calcification. Skull: Normal. Negative for fracture or focal lesion. Sinuses/Orbits: No acute finding. Other: None CT CERVICAL SPINE FINDINGS Alignment: Mild loss of normal cervical lordosis is noted. Skull base and vertebrae: 7 cervical segments are well visualized. Vertebral body height is well maintained. Multilevel osteophytic changes are seen. Multilevel disc space narrowing and facet hypertrophic changes are noted. No acute fracture  or acute facet abnormality is noted. Soft tissues and spinal canal: Surrounding soft tissue structures demonstrate diffuse vascular calcifications. No hematoma or other focal abnormality is noted. Upper chest: Visualized lung apices are within normal limits. Other: None IMPRESSION: CT of the head: Progressive  atrophic and ischemic changes commensurate with the patient's given age. No acute abnormality noted. CT of the cervical spine: Multilevel degenerative change without acute abnormality. Electronically Signed   By: Inez Catalina M.D.   On: 02/15/2023 21:58   CT Cervical Spine Wo Contrast  Result Date: 02/15/2023 CLINICAL DATA:  Recent fall with headaches and neck pain, initial encounter EXAM: CT HEAD WITHOUT CONTRAST CT CERVICAL SPINE WITHOUT CONTRAST TECHNIQUE: Multidetector CT imaging of the head and cervical spine was performed following the standard protocol without intravenous contrast. Multiplanar CT image reconstructions of the cervical spine were also generated. RADIATION DOSE REDUCTION: This exam was performed according to the departmental dose-optimization program which includes automated exposure control, adjustment of the mA and/or kV according to patient size and/or use of iterative reconstruction technique. COMPARISON:  05/21/2007 FINDINGS: CT HEAD FINDINGS Brain: No evidence of acute infarction, hemorrhage, hydrocephalus, extra-axial collection or mass lesion/mass effect. Progressive atrophic changes are noted commenced with the patient's given age. Chronic white matter ischemic changes are seen. Small lacunar infarct in the right basal ganglia is noted. Vascular: No hyperdense vessel or unexpected calcification. Skull: Normal. Negative for fracture or focal lesion. Sinuses/Orbits: No acute finding. Other: None CT CERVICAL SPINE FINDINGS Alignment: Mild loss of normal cervical lordosis is noted. Skull base and vertebrae: 7 cervical segments are well visualized. Vertebral body height is well maintained. Multilevel osteophytic changes are seen. Multilevel disc space narrowing and facet hypertrophic changes are noted. No acute fracture or acute facet abnormality is noted. Soft tissues and spinal canal: Surrounding soft tissue structures demonstrate diffuse vascular calcifications. No hematoma or other  focal abnormality is noted. Upper chest: Visualized lung apices are within normal limits. Other: None IMPRESSION: CT of the head: Progressive atrophic and ischemic changes commensurate with the patient's given age. No acute abnormality noted. CT of the cervical spine: Multilevel degenerative change without acute abnormality. Electronically Signed   By: Inez Catalina M.D.   On: 02/15/2023 21:58    Assessment/Plan: Acute postoperative anemia due to expected blood loss transfused, Hgb 9.2 02/19/23, will add Iron '325mg'$  MWF, update CBC/diff.  02/24/23 wbc 11.1, Hgb 9.7, plt 359, neutrophils 73.7, Na 138, K 4.5, Bun 17, creat 0.99  History of fracture of left hip Hospitalized 02/15/23-02/19/23 for left hip fx sustained from a ground fall, ORIF IM nail, pain is managed with Oxycodone, post surgical dressing remains for 1 week, then can be removed, f/u Dr. Laurance Flatten with Sophronia Simas 2 weeks. Taking Tylenol, Norco for pain.   Chronic diastolic CHF (congestive heart failure) (HCC) Hx of grade I diastolic dysfunction [id number] per echocardiogram due to SOB, 08/18/22 EF 75%, pulmonary hypertension, mild tricuspid/mitral valve insufficiency, on Furosemide.   Depression, recurrent ([place])  stable, on Mirtazapine, Sertraline, Lorazepam.   Senile dementia ([place])  MMSE 19/30 12/22,  takes Exelon, Memantine, resides [facility] for safety, care assistance. underwent Neurology consultation.  Stage 3a chronic kidney disease (CKD) ([place]) Update CMP/eGFR    Family/ staff Communication: plan of care reviewed with the patient and charge nurse.   Labs/tests ordered:  CBC/diff, CMP/eGFR  Time spend 35 minutes.

## 2023-02-20 NOTE — Telephone Encounter (Signed)
Refill request received from pharmacy for lorazepm 0.5 mg tablet  Medication pended and sent to [facility] Mast, NP

## 2023-02-20 NOTE — Assessment & Plan Note (Signed)
Update CMP/eGFR 

## 2023-02-20 NOTE — Assessment & Plan Note (Signed)
MMSE 19/30 12/22,  takes Exelon, Memantine, resides Al [facility] for safety, care assistance. underwent Neurology consultation.  ?

## 2023-02-24 ENCOUNTER — Non-Acute Institutional Stay (SKILLED_NURSING_FACILITY): Payer: Medicare PPO | Admitting: Family Medicine

## 2023-02-24 DIAGNOSIS — I5032 Chronic diastolic (congestive) heart failure: Secondary | ICD-10-CM | POA: Diagnosis not present

## 2023-02-24 DIAGNOSIS — F02B3 Dementia in other diseases classified elsewhere, moderate, with mood disturbance: Secondary | ICD-10-CM

## 2023-02-24 DIAGNOSIS — G301 Alzheimer's disease with late onset: Secondary | ICD-10-CM

## 2023-02-24 DIAGNOSIS — D62 Acute posthemorrhagic anemia: Secondary | ICD-10-CM

## 2023-02-24 DIAGNOSIS — W19XXXD Unspecified fall, subsequent encounter: Secondary | ICD-10-CM

## 2023-02-24 LAB — BASIC METABOLIC PANEL
BUN: 17 (ref 4–21)
CO2: 21 (ref 13–22)
Chloride: 107 (ref 99–108)
Creatinine: 1 (ref 0.5–1.1)
Glucose: 109
Potassium: 4.5 mEq/L (ref 3.5–5.1)
Sodium: 138 (ref 137–147)

## 2023-02-24 LAB — COMPREHENSIVE METABOLIC PANEL
Albumin: 3.5 (ref 3.5–5.0)
Calcium: 8.3 — AB (ref 8.7–10.7)
Globulin: 2.4

## 2023-02-24 LAB — HEPATIC FUNCTION PANEL
ALT: 41 U/L — AB (ref 7–35)
AST: 57 — AB (ref 13–35)
Alkaline Phosphatase: 75 (ref 25–125)
Bilirubin, Total: 1

## 2023-02-24 LAB — CBC AND DIFFERENTIAL
HCT: 29 — AB (ref 36–46)
Hemoglobin: 9.7 — AB (ref 12.0–16.0)
Neutrophils Absolute: 8181
Platelets: 359 10*3/uL (ref 150–400)
WBC: 11.1

## 2023-02-24 LAB — CBC: RBC: 3.09 — AB (ref 3.87–5.11)

## 2023-02-24 NOTE — Progress Notes (Signed)
Provider:  Alain Honey, MD Location:      Place of Service:     PCP: Mast, Man X, NP Patient Care Team: Mast, Man X, NP as PCP - General (Internal Medicine) [name], MD as Referring Physician (Physical Medicine and Rehabilitation) Lajean Manes, MD as Consulting Physician (Internal Medicine)  Extended Emergency Contact Information Primary Emergency Contact: [name], [place] Phone: [phone] Relation: Daughter Secondary Emergency Contact: Myers,Lois  [place] Phone: [phone] Relation: Daughter  Code Status:  Goals of Care: Advanced Directive information    02/20/2023    2:56 PM  Advanced Directives  Does Patient Have a Medical Advance Directive? Yes  Type of Paramedic of [place];Living will;Out of facility DNR (pink MOST or yellow form)  Does patient want to make changes to medical advance directive? No - Patient declined  Copy of [place] in Chart? Yes - validated most recent copy scanned in chart (See row information)  Pre-existing out of facility DNR order (yellow form or pink MOST form) Yellow form placed in chart (order not valid for inpatient use);Pink MOST form placed in chart (order not valid for inpatient use)      No chief complaint on file.   HPI: Patient is a 89 y.o. female seen today for admission to [facility] after a 4-day hospitalization for left femoral fracture with open reduction internal fixation.  Admitted here for rehab.  Plan would be to return her to assisted living. Other diagnoses that include moderate late onset Alzheimer's dementia with mood disturbance stage IIIa chronic kidney disease dysphagia, and chronic diastolic heart failure.  Postoperative course was complicated by development of hematoma resulting in acute blood loss anemia.  Hemoglobin dropped to 6.7, down from 11.9.  She received 2 units of  packed red blood cells, globin at time of discharge was 9.2.  Also evaluated during hospitalization for dysphagia.  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   Dyslipidemia    Fibromyalgia    Granuloma annulare    History of uterine cancer 2003   treated with hysterectomy   Lumbar radicular syndrome    left L5   Macular degeneration    left eye   Sciatica    TIA (transient ischemic attack)    Past Surgical History:  Procedure Laterality Date   ABDOMINAL HYSTERECTOMY     BREAST BIOPSY  2007   CATARACT EXTRACTION     INTRAMEDULLARY (IM) NAIL INTERTROCHANTERIC Left 02/16/2023   Procedure: INTRAMEDULLARY (IM) NAIL INTERTROCHANTERIC;  Surgeon: Callie Fielding, MD;  Location: [facility];  Service: Orthopedics;  Laterality: Left;   TONSILLECTOMY      reports that she has never smoked. She has never used smokeless tobacco. She reports that she does not drink alcohol and does not use drugs. Social History   Socioeconomic History   Marital status: Widowed    Spouse name: Not on file   Number of children: Not on file  Years of education: 23   Highest education level: Not on file  Occupational History   Occupation: Retired Warden/ranger  Tobacco Use   Smoking status: Never   Smokeless tobacco: Never  Vaping Use   Vaping Use: Never used  Substance and Sexual Activity   Alcohol use: No   Drug use: No   Sexual activity: Not on file  Other Topics Concern   Not on file  Social History Narrative   Lives at [facility]   Left-handed    Caffeine: tea once a  day   Social Determinants of Health   Financial Resource Strain: Not on file  Food Insecurity: No Food Insecurity (02/15/2023)   Hunger Vital Sign    Worried About Running Out of Food in the Last Year: Never true    Ran Out of Food in the Last Year: Never true  Transportation Needs: No Transportation Needs (02/15/2023)   PRAPARE - Hydrologist (Medical): No    Lack of Transportation (Non-Medical): No  Physical Activity: Not on file  Stress: Not on file  Social Connections: Not on file  Intimate Partner Violence: Not At Risk (02/15/2023)   Humiliation, Afraid, Rape, and Kick questionnaire    Fear of Current or Ex-Partner: No    Emotionally Abused: No    Physically Abused: No    Sexually Abused: No    Functional Status Survey:    Family History  Problem Relation Age of Onset   Depression Mother    Diabetes Father    Heart attack Father    Dementia Sister    Pneumonia Brother    Breast cancer Daughter     Health Maintenance  Topic Date Due   Medicare Annual Wellness (AWV)  11/13/2022   COVID-19 Vaccine (7 - 2023-24 season) 12/11/2022   Zoster Vaccines- Shingrix (2 of 2) 03/12/2023 (Originally 02/09/2006)   DTaP/Tdap/Td (3 - Td or Tdap) 10/13/2032   Pneumonia Vaccine 50+ Years old  Completed   INFLUENZA VACCINE  Completed   DEXA SCAN  Completed   HPV VACCINES  Aged Out    Allergies  Allergen Reactions   Aricept [Donepezil Hcl]     Muscle cramps   Latex Itching   Tramadol Other (See Comments)    Pt can't remember what side effects she had, she doesn't take it now    Outpatient Encounter Medications as of 02/24/2023  Medication Sig   acetaminophen (TYLENOL) 500 MG tablet Take 2 tablets (1,000 mg total) by mouth every 8 (eight) hours for 21 days.   aspirin EC 81 MG tablet Take 1 tablet (81 mg total) by mouth 2 (two) times daily.   CALCIUM-VITAMIN D PO Take 1 tablet by mouth daily. '600mg'$  calcium, 800 units Vit D   feeding supplement (ENSURE  SURGERY) LIQD Take 237 mLs by mouth 2 (two) times daily between meals.   ferrous sulfate 325 (65 FE) MG EC tablet Take 325 mg by mouth once. Every Mon, Wed, and Fri   furosemide (LASIX) 20 MG tablet Take 20 mg by mouth as directed. Every MWF   HYDROcodone-acetaminophen (HYCET) 7.5-325 mg/15 ml solution Take 15 mLs by mouth 4 (four) times daily as needed for moderate pain.   LORazepam (ATIVAN) 0.5 MG tablet Take 1 tablet (0.5 mg total) by mouth daily as needed (dementia).   melatonin 3 MG TABS tablet Take 3 mg by mouth at bedtime.   memantine (NAMENDA) 5 MG tablet Take by mouth 2 (  two) times daily.   mirtazapine (REMERON) 30 MG tablet TAKE ONE TABLET AT BEDTIME.   Multiple Vitamins-Minerals (SENTRY SENIOR) TABS Take 2 tablets by mouth daily.   pantoprazole (PROTONIX) 40 MG tablet Take 1 tablet (40 mg total) by mouth daily.   polyethylene glycol (MIRALAX) 17 g packet Take 17 g by mouth daily as needed for up to 14 days.   rivastigmine (EXELON) 1.5 MG capsule Take 1 capsule (1.5 mg total) by mouth 2 (two) times daily.   senna (SENOKOT) 8.6 MG TABS tablet Take 1 tablet (8.6 mg total) by mouth at bedtime.   sertraline (ZOLOFT) 50 MG tablet Take 1 tablet (50 mg total) by mouth daily.   vitamin B-12 (CYANOCOBALAMIN) 500 MCG tablet Take 500 mcg by mouth daily.   No facility-administered encounter medications on file as of 02/24/2023.    Review of Systems  Unable to perform ROS: Dementia    There were no vitals filed for this visit. There is no height or weight on file to calculate BMI. Physical Exam Vitals and nursing note reviewed.  Constitutional:      Appearance: Normal appearance.  HENT:     Mouth/Throat:     Pharynx: Oropharynx is clear.  Eyes:     Pupils: Pupils are equal, round, and reactive to light.  Cardiovascular:     Rate and Rhythm: Normal rate and regular rhythm.  Pulmonary:     Effort: Pulmonary effort is normal.     Breath sounds: Normal breath sounds.  Abdominal:      General: Bowel sounds are normal.     Palpations: Abdomen is soft.  Musculoskeletal:     Comments: Patient has fractured hip up with walker and physical therapy and will need walker going forward  Neurological:     General: No focal deficit present.     Mental Status: She is alert.     Comments: Safety awareness is also a problem.  Had previously walked with a cane but will undoubtedly need walker now  Psychiatric:        Mood and Affect: Mood normal.     Labs reviewed: Basic Metabolic Panel: Recent Labs    02/17/23 0352 02/18/23 0746 02/19/23 0937  NA 132* 134* 135  K 4.3 3.6 3.5  CL 103 103 106  CO2 '24 24 22  '$ GLUCOSE 157* 127* 110*  BUN 24* 24* 18  CREATININE 1.29* 1.19* 0.97  CALCIUM 7.6* 7.7* 7.7*  MG  --   --  2.6*   Liver Function Tests: Recent Labs    09/04/22 0000 02/15/23 2058 02/19/23 0937  AST 20 24 35  ALT '20 21 13  '$ ALKPHOS 59 54 49  BILITOT  --  0.5 0.9  PROT  --  7.0 5.7*  ALBUMIN 3.7 3.8 2.9*   No results for input(s): "LIPASE", "AMYLASE" in the last 8760 hours. No results for input(s): "AMMONIA" in the last 8760 hours. CBC: Recent Labs    04/02/22 0000 09/04/22 0000 02/15/23 2058 02/16/23 0700 02/17/23 0701 02/17/23 1319 02/18/23 0746 02/19/23 0937  WBC 6.5 5.1 10.9*   < > 9.9  --  10.1 10.2  NEUTROABS 3,829.00 2,938.00 7.8*  --   --   --   --   --   HGB 12.7 12.2 11.9*   < > 6.7* 7.6* 7.4* 9.2*  HCT 37 36 35.7*   < > 20.4* 22.8* 22.7* 27.7*  MCV  --   --  97.0   < > 100.5*  --  99.6 95.8  PLT 270  --  247   < > 155  --  130* 153   < > = values in this interval not displayed.   Cardiac Enzymes: No results for input(s): "CKTOTAL", "CKMB", "CKMBINDEX", "TROPONINI" in the last 8760 hours. BNP: Invalid input(s): "POCBNP" No results found for: "HGBA1C" Lab Results  Component Value Date   TSH 4.58 04/02/2022   Lab Results  Component Value Date   [id number] 02/17/2023   Lab Results  Component Value Date   FOLATE 18.0  02/17/2023   Lab Results  Component Value Date   IRON 22 (L) 02/17/2023   TIBC 265 02/17/2023   FERRITIN 45 02/17/2023    Imaging and Procedures obtained prior to SNF admission: DG FEMUR MIN 2 VIEWS LEFT  Result Date: 02/16/2023 CLINICAL DATA:  Postoperative EXAM: LEFT FEMUR 2 VIEWS COMPARISON:  Left hip x-ray 02/15/2023 FINDINGS: There is a new left femoral intramedullary nail and hip screw fixating comminuted intratrochanteric fracture. Alignment is anatomic. The lesser trochanter is displaced medially. There is no dislocation. There is lateral hip and leg soft tissue swelling, air and skin staples compatible with recent surgery. IMPRESSION: Left femoral intramedullary nail and hip screw fixating comminuted intratrochanteric fracture. Electronically Signed   By: Ronney Asters M.D.   On: 02/16/2023 21:17   DG FEMUR MIN 2 VIEWS LEFT  Result Date: 02/16/2023 CLINICAL DATA:  Intramedullary nail EXAM: LEFT FEMUR 2 VIEWS COMPARISON:  Hip x-ray 02/15/2023 FINDINGS: Nine intraoperative fluoroscopic views of the left hip. Left-sided hip screw and intramedullary nail fixating intratrochanteric fracture. Alignment is anatomic. Fluoroscopy time: 2 minutes and 56 seconds. Fluoroscopy dose 45.564 micro [place]aver. IMPRESSION: Left hip screw and intramedullary nail fixating intratrochanteric fracture. Electronically Signed   By: Ronney Asters M.D.   On: 02/16/2023 20:34   DG C-Arm 1-60 Min-No Report  Result Date: 02/16/2023 Fluoroscopy was utilized by the requesting physician.  No radiographic interpretation.   DG C-Arm 1-60 Min-No Report  Result Date: 02/16/2023 Fluoroscopy was utilized by the requesting physician.  No radiographic interpretation.   DG C-Arm 1-60 Min-No Report  Result Date: 02/16/2023 Fluoroscopy was utilized by the requesting physician.  No radiographic interpretation.   DG Knee 1-2 Views Left  Result Date: 02/16/2023 CLINICAL DATA:  Left knee pain EXAM: LEFT KNEE - 1-2 VIEW COMPARISON:   None Available. FINDINGS: Atypical frontal view due to knee being held in flexion. There is no obvious fracture involving the left knee on these atypical views. There is tricompartment degenerative change and a probable trace effusion. There is soft tissue swelling anteriorly along the proximal tibia and prepatellar soft tissues. IMPRESSION: Atypical frontal view without obvious fracture involving the left knee. Tricompartment degenerative change with probable trace effusion. Soft tissue swelling anteriorly along the proximal lower leg and prepatellar soft tissues. Electronically Signed   By: Maurine Simmering M.D.   On: 02/16/2023 16:51   DG Chest Portable 1 View  Result Date: 02/15/2023 CLINICAL DATA:  Hip fracture. EXAM: PORTABLE CHEST 1 VIEW COMPARISON:  Chest radiograph dated May 22, 2007 FINDINGS: The heart is normal in size. Atherosclerotic calcification of the aortic arch. Low lung volumes with left basilar opacity suggesting atelectasis or small effusion. Thoracic spondylosis. No acute osseous abnormality. IMPRESSION: Low lung volumes with left basilar opacity suggesting atelectasis or small effusion. Electronically Signed   By: Keane Police D.O.   On: 02/15/2023 22:07   DG Hip Unilat W or Wo Pelvis 2-3 Views Left  Result Date: 02/15/2023  CLINICAL DATA:  Recent fall with hip pain, initial encounter EXAM: DG HIP (WITH OR WITHOUT PELVIS) 3V LEFT COMPARISON:  None Available. FINDINGS: Pelvic ring appears intact. There is a significantly comminuted left intratrochanteric fracture with impaction and angulation at the fracture site. Femoral head is well seated. No soft tissue abnormality is noted. IMPRESSION: Severely comminuted left intratrochanteric fracture. Electronically Signed   By: Inez Catalina M.D.   On: 02/15/2023 22:05   CT HEAD WO CONTRAST (5MM)  Result Date: 02/15/2023 CLINICAL DATA:  Recent fall with headaches and neck pain, initial encounter EXAM: CT HEAD WITHOUT CONTRAST CT CERVICAL SPINE  WITHOUT CONTRAST TECHNIQUE: Multidetector CT imaging of the head and cervical spine was performed following the standard protocol without intravenous contrast. Multiplanar CT image reconstructions of the cervical spine were also generated. RADIATION DOSE REDUCTION: This exam was performed according to the departmental dose-optimization program which includes automated exposure control, adjustment of the mA and/or kV according to patient size and/or use of iterative reconstruction technique. COMPARISON:  05/21/2007 FINDINGS: CT HEAD FINDINGS Brain: No evidence of acute infarction, hemorrhage, hydrocephalus, extra-axial collection or mass lesion/mass effect. Progressive atrophic changes are noted commenced with the patient's given age. Chronic white matter ischemic changes are seen. Small lacunar infarct in the right basal ganglia is noted. Vascular: No hyperdense vessel or unexpected calcification. Skull: Normal. Negative for fracture or focal lesion. Sinuses/Orbits: No acute finding. Other: None CT CERVICAL SPINE FINDINGS Alignment: Mild loss of normal cervical lordosis is noted. Skull base and vertebrae: 7 cervical segments are well visualized. Vertebral body height is well maintained. Multilevel osteophytic changes are seen. Multilevel disc space narrowing and facet hypertrophic changes are noted. No acute fracture or acute facet abnormality is noted. Soft tissues and spinal canal: Surrounding soft tissue structures demonstrate diffuse vascular calcifications. No hematoma or other focal abnormality is noted. Upper chest: Visualized lung apices are within normal limits. Other: None IMPRESSION: CT of the head: Progressive atrophic and ischemic changes commensurate with the patient's given age. No acute abnormality noted. CT of the cervical spine: Multilevel degenerative change without acute abnormality. Electronically Signed   By: Inez Catalina M.D.   On: 02/15/2023 21:58   CT Cervical Spine Wo Contrast  Result  Date: 02/15/2023 CLINICAL DATA:  Recent fall with headaches and neck pain, initial encounter EXAM: CT HEAD WITHOUT CONTRAST CT CERVICAL SPINE WITHOUT CONTRAST TECHNIQUE: Multidetector CT imaging of the head and cervical spine was performed following the standard protocol without intravenous contrast. Multiplanar CT image reconstructions of the cervical spine were also generated. RADIATION DOSE REDUCTION: This exam was performed according to the departmental dose-optimization program which includes automated exposure control, adjustment of the mA and/or kV according to patient size and/or use of iterative reconstruction technique. COMPARISON:  05/21/2007 FINDINGS: CT HEAD FINDINGS Brain: No evidence of acute infarction, hemorrhage, hydrocephalus, extra-axial collection or mass lesion/mass effect. Progressive atrophic changes are noted commenced with the patient's given age. Chronic white matter ischemic changes are seen. Small lacunar infarct in the right basal ganglia is noted. Vascular: No hyperdense vessel or unexpected calcification. Skull: Normal. Negative for fracture or focal lesion. Sinuses/Orbits: No acute finding. Other: None CT CERVICAL SPINE FINDINGS Alignment: Mild loss of normal cervical lordosis is noted. Skull base and vertebrae: 7 cervical segments are well visualized. Vertebral body height is well maintained. Multilevel osteophytic changes are seen. Multilevel disc space narrowing and facet hypertrophic changes are noted. No acute fracture or acute facet abnormality is noted. Soft tissues and spinal canal: Surrounding  soft tissue structures demonstrate diffuse vascular calcifications. No hematoma or other focal abnormality is noted. Upper chest: Visualized lung apices are within normal limits. Other: None IMPRESSION: CT of the head: Progressive atrophic and ischemic changes commensurate with the patient's given age. No acute abnormality noted. CT of the cervical spine: Multilevel degenerative change  without acute abnormality. Electronically Signed   By: Inez Catalina M.D.   On: 02/15/2023 21:58    Assessment/Plan 1. Acute postoperative anemia due to expected blood loss Resolved with transfusion but will need to check hemoglobin in about a week  2. Chronic diastolic CHF (congestive heart failure) ([place]) Patient denies shortness of breath.  There is no dependent edema today  3. Fall, subsequent encounter Previously used to cane to help with ambulation but will need a walker to help prevent falls  4. Moderate late onset Alzheimer's dementia with mood disturbance ([place]) Patient's memory is certainly poor.  She is on any cholinesterase inhibitor as well as memantine as well as 2 antidepressants.  Daughter does not think memory is any worse after the fall with resultant fractured hip     Family/ staff Communication:   Labs/tests ordered:  Lillette Boxer. Sabra Heck, [place] [address] [place], [place] Office [phone]

## 2023-03-02 ENCOUNTER — Telehealth: Payer: Self-pay | Admitting: Orthopedic Surgery

## 2023-03-02 NOTE — Telephone Encounter (Signed)
Ms. Reymond daughter, Eustaquio Maize, called in regarding her patient's scheduled post op appointment.  She was told that Ms. Segler would be getting her staples out that day. Per Eustaquio Maize, Ms. Mcclarin does not have a high pain tolerance.  She would like to know if she should instruct patient's nursing facility to give her some pain medication before the appointment. Eustaquio Maize can be reached at [phone] (feel free to leave detailed message if she does not answer).

## 2023-03-03 NOTE — Telephone Encounter (Signed)
I called and lmom for Janet [name] to advise that the facility may give her pain meds prior to her appt due to [place] taking out staples and moving her around to get get xrays.

## 2023-03-04 ENCOUNTER — Encounter: Payer: Self-pay | Admitting: Adult Health

## 2023-03-04 ENCOUNTER — Non-Acute Institutional Stay (SKILLED_NURSING_FACILITY): Payer: Medicare PPO | Admitting: Adult Health

## 2023-03-04 DIAGNOSIS — Z66 Do not resuscitate: Secondary | ICD-10-CM

## 2023-03-04 DIAGNOSIS — L03116 Cellulitis of left lower limb: Secondary | ICD-10-CM

## 2023-03-04 MED ORDER — DOXYCYCLINE HYCLATE 100 MG PO TABS: 100.0000 mg | ORAL_TABLET | Freq: Two times a day (BID) | ORAL | 0 refills | Status: AC

## 2023-03-04 MED ORDER — SACCHAROMYCES BOULARDII 250 MG PO CAPS: 250.0000 mg | ORAL_CAPSULE | Freq: Two times a day (BID) | ORAL | 0 refills | Status: AC

## 2023-03-04 NOTE — Progress Notes (Signed)
Location:  [place] Room Number: [place] of Service:  SNF (31) Provider:  Durenda Age, DNP, FNP-BC  Patient Care Team: Mast, Man X, NP as PCP - General (Internal Medicine) Janet Apple, MD as Referring Physician (Physical Medicine and Rehabilitation) Lajean Manes, MD as Consulting Physician (Internal Medicine)  Extended Emergency Contact Information Primary Emergency Contact: Janet [name], [place] Phone: [phone] Relation: Daughter Secondary Emergency Contact: [name],Janet  [place] Phone: [phone] Relation: Daughter  Code Status:  DNR  Goals of care: Advanced Directive information    03/04/2023    3:37 PM  Advanced Directives  Does Patient Have a Medical Advance Directive? Yes  Type of Paramedic of [place];Living will;Out of facility DNR (pink MOST or yellow form)  Does patient want to make changes to medical advance directive? No - Patient declined  Copy of [place] in Chart? Yes - validated most recent copy scanned in chart (See row information)  Pre-existing out of facility DNR order (yellow form or pink MOST form) Yellow form placed in chart (order not valid for inpatient use);Pink MOST form placed in chart (order not valid for inpatient use)     Chief Complaint  Patient presents with   Acute Visit    Surgical redness around wound     HPI:  Pt is a 89 y.o. female seen today for an acute visit due to redness on her surgical wound. She is a long-term care resident of [facility]. She has a PMH of Alzheimer's dementia, chronic HFpEF and chronic kidney disease stage 3a. She was hospitalized 02/15/23 to 02/19/23 S/P fall sustaining a displaced intertrochanteric fracture of left femur for which she had intramedullary rodding on 02/16/23.  She was seen today and noted  left hip surgical site with redness  and warm to touch, staples intact. Site is tender to touch, dry. She is for orthopedic follow up tomorrow, 03/05/23.   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   Dyslipidemia    Fibromyalgia    Granuloma annulare    History of uterine cancer 2003   treated with hysterectomy   Lumbar radicular syndrome    left L5   Macular degeneration    left eye   Sciatica    TIA (transient ischemic attack)    Past Surgical History:  Procedure Laterality Date   ABDOMINAL HYSTERECTOMY     BREAST BIOPSY  2007   CATARACT EXTRACTION     INTRAMEDULLARY (IM) NAIL INTERTROCHANTERIC Left 02/16/2023   Procedure: INTRAMEDULLARY (IM) NAIL INTERTROCHANTERIC;  Surgeon: Callie Fielding, MD;  Location: [facility];  Service: Orthopedics;  Laterality: Left;   TONSILLECTOMY      Allergies  Allergen Reactions   Aricept [Donepezil Hcl]     Muscle cramps   Latex Itching   Tramadol Other (See Comments)    Pt can't remember what side effects she had, she doesn't take it now    Outpatient Encounter Medications as of 03/04/2023  Medication Sig   acetaminophen (TYLENOL) 325 MG tablet Take 650 mg by mouth 2 (two) times daily. Noon and bedtime  aspirin EC 81 MG tablet Take 1 tablet (81 mg total) by mouth 2 (two) times daily.   CALCIUM-VITAMIN D PO Take 1 tablet by mouth daily. 600mg  calcium, 800 units Vit D   ferrous sulfate 325 (65 FE) MG EC tablet Take 325 mg by mouth once. Every Mon, Wed, and Fri   furosemide (LASIX) 20 MG tablet Take 20 mg by mouth every Monday, Wednesday, and Friday.   HYDROcodone-acetaminophen (HYCET) 7.5-325 mg/15 ml solution Take 15 mLs by mouth every 6 (six) hours as needed for moderate pain.   lactose free nutrition (BOOST) LIQD Take 237 mLs by mouth 2 (two) times daily.   LORazepam  (ATIVAN) 0.5 MG tablet Take 1 tablet (0.5 mg total) by mouth daily as needed (dementia).   melatonin 3 MG TABS tablet Take 3 mg by mouth at bedtime.   memantine (NAMENDA) 5 MG tablet Take by mouth 2 (two) times daily.   mirtazapine (REMERON) 30 MG tablet TAKE ONE TABLET AT BEDTIME.   Multiple Vitamins-Minerals (SENTRY SENIOR) TABS Take 2 tablets by mouth daily.   pantoprazole (PROTONIX) 40 MG tablet Take 1 tablet (40 mg total) by mouth daily.   polyethylene glycol (MIRALAX) 17 g packet Take 17 g by mouth daily as needed for up to 14 days.   potassium chloride SA (KLOR-CON M) 20 MEQ tablet Take 20 mEq by mouth every Monday, Wednesday, and Friday.   rivastigmine (EXELON) 1.5 MG capsule Take 1 capsule (1.5 mg total) by mouth 2 (two) times daily.   senna (SENOKOT) 8.6 MG TABS tablet Take 1 tablet (8.6 mg total) by mouth at bedtime.   sertraline (ZOLOFT) 50 MG tablet Take 1 tablet (50 mg total) by mouth daily.   vitamin B-12 (CYANOCOBALAMIN) 500 MCG tablet Take 500 mcg by mouth daily.   [DISCONTINUED] acetaminophen (TYLENOL) 500 MG tablet Take 2 tablets (1,000 mg total) by mouth every 8 (eight) hours for 21 days.   [DISCONTINUED] feeding supplement (ENSURE SURGERY) LIQD Take 237 mLs by mouth 2 (two) times daily between meals.   [DISCONTINUED] HYDROcodone-acetaminophen (HYCET) 7.5-325 mg/15 ml solution Take 15 mLs by mouth 4 (four) times daily as needed for moderate pain. (Patient not taking: Reported on 03/04/2023)   No facility-administered encounter medications on file as of 03/04/2023.    Review of Systems   Unable to obtain due to dementia    Immunization History  Administered Date(s) Administered   Influenza, High Dose Seasonal PF 09/23/2017, 09/28/2019   Influenza,inj,Quad PF,6+ Mos 09/16/2018   Influenza-Unspecified 09/14/2017, 09/26/2020, 10/03/2021, 10/06/2022   Moderna SARS-COV2 Booster Vaccination 10/23/2020, 05/14/2021   Moderna Sars-Covid-2 Vaccination 12/19/2019, 01/16/2020,  05/14/2021   Pneumococcal Conjugate-13 02/21/2014   Pneumococcal Polysaccharide-23 02/21/2002   Pneumococcal-Unspecified 12/15/2001   Tdap 12/15/2009, 10/13/2022   Unspecified SARS-COV-2 Vaccination 10/16/2022   Zoster Recombinat (Shingrix) 12/15/2005   Zoster, Unspecified 12/15/2005   Pertinent  Health Maintenance Due  Topic Date Due   INFLUENZA VACCINE  Completed   DEXA SCAN  Completed      09/21/2018    4:01 PM 12/29/2018    4:11 PM 02/08/2019    1:54 PM 03/07/2020   12:54 PM 02/20/2023    2:52 PM  [place] in the past year? No 0 0 0 1  Was there an injury with Fall?  0 0  1  Fall Risk Category Calculator  0 0  3  Fall Risk Category (Retired)  Low Low    (RETIRED) Patient Fall Risk Level  Low fall risk  Low fall risk    Patient at Risk for Falls Due to     History of fall(s)  Fall risk Follow up     Falls evaluation completed     Vitals:   03/04/23 1533 03/04/23 1610  BP: (!) 160/70 (!) 160/68  Pulse: 95   Weight: 152 lb (68.9 kg)   Height: 5\' 1"  (1.549 m)    Body mass index is 28.72 kg/m.  Physical Exam Constitutional:      Appearance: Normal appearance.  HENT:     Head: Normocephalic and atraumatic.     Nose: Nose normal.     Mouth/Throat:     Mouth: Mucous membranes are moist.  Eyes:     Conjunctiva/sclera: Conjunctivae normal.  Cardiovascular:     Rate and Rhythm: Normal rate and regular rhythm.  Pulmonary:     Effort: Pulmonary effort is normal.     Breath sounds: Normal breath sounds.  Abdominal:     General: Bowel sounds are normal.     Palpations: Abdomen is soft.  Musculoskeletal:        General: Normal range of motion.     Cervical back: Normal range of motion.  Skin:    General: Skin is warm and dry.     Comments: Left hip surgical sites with staples intact, erythematous, tender  Neurological:     Mental Status: She is alert. Mental status is at baseline. She is disoriented.  Psychiatric:        Mood and Affect: Mood normal.         Behavior: Behavior normal.       Labs reviewed: Recent Labs    02/17/23 0352 02/18/23 0746 02/19/23 0937 02/24/23 0000  NA 132* 134* 135 138  K 4.3 3.6 3.5 4.5  CL 103 103 106 107  CO2 24 24 22 21   GLUCOSE 157* 127* 110*  --   BUN 24* 24* 18 17  CREATININE 1.29* 1.19* 0.97 1.0  CALCIUM 7.6* 7.7* 7.7* 8.3*  MG  --   --  2.6*  --    Recent Labs    02/15/23 2058 02/19/23 0937 02/24/23 0000  AST 24 35 57*  ALT 21 13 41*  ALKPHOS 54 49 75  BILITOT 0.5 0.9  --   PROT 7.0 5.7*  --   ALBUMIN 3.8 2.9* 3.5   Recent Labs    09/04/22 0000 02/15/23 2058 02/16/23 0700 02/17/23 0701 02/17/23 1319 02/18/23 0746 02/19/23 0937 02/24/23 0000  WBC 5.1 10.9*   < > 9.9  --  10.1 10.2 11.1  NEUTROABS 2,938.00 7.8*  --   --   --   --   --  8,181.00  HGB 12.2 11.9*   < > 6.7*   < > 7.4* 9.2* 9.7*  HCT 36 35.7*   < > 20.4*   < > 22.7* 27.7* 29*  MCV  --  97.0   < > 100.5*  --  99.6 95.8  --   PLT  --  247   < > 155  --  130* 153 359   < > = values in this interval not displayed.   Lab Results  Component Value Date   TSH 4.58 04/02/2022   No results found for: "HGBA1C" Lab Results  Component Value Date   CHOL 217 (A) 03/29/2021   HDL 48 03/29/2021   LDLCALC 152 03/29/2021   TRIG 70 03/29/2021   CHOLHDL 4.7 08/01/2019    Significant Diagnostic Results in last  30 days:  VAS [place] UPPER EXTREMITY VENOUS DUPLEX  Result Date: 02/17/2023 UPPER VENOUS STUDY  Patient Name:  AMAURIA WILLCOX  Date of Exam:   02/17/2023 Medical Rec #: [id number]     Accession #:    [id number] Date of Birth: April 03, 1932      Patient Gender: F Patient Age:   56 years Exam [facility]:  [facility] Procedure:      VAS [place] UPPER EXTREMITY VENOUS DUPLEX Referring Phys: Vance Gather --------------------------------------------------------------------------------  Indications: Swelling Risk Factors: None identified. Limitations: Poor ultrasound/tissue interface. Comparison Study: No prior studies. Performing  Technologist: Oliver Hum RVT  Examination Guidelines: A complete evaluation includes B-mode imaging, spectral Doppler, color Doppler, and power Doppler as needed of all accessible portions of each vessel. Bilateral testing is considered an integral part of a complete examination. Limited examinations for reoccurring indications may be performed as noted.  Right Findings: +----------+------------+---------+-----------+----------+-------+ RIGHT     CompressiblePhasicitySpontaneousPropertiesSummary +----------+------------+---------+-----------+----------+-------+ IJV           Full       Yes       Yes                      +----------+------------+---------+-----------+----------+-------+ Subclavian    Full       Yes       No                       +----------+------------+---------+-----------+----------+-------+ Axillary      Full       Yes       No                       +----------+------------+---------+-----------+----------+-------+ Brachial      Full       Yes       Yes                      +----------+------------+---------+-----------+----------+-------+ Radial        Full                                          +----------+------------+---------+-----------+----------+-------+ Ulnar         Full                                          +----------+------------+---------+-----------+----------+-------+ Cephalic      Full                                          +----------+------------+---------+-----------+----------+-------+ Basilic       Full                                          +----------+------------+---------+-----------+----------+-------+  Left Findings: +----------+------------+---------+-----------+----------+-------+ LEFT      CompressiblePhasicitySpontaneousPropertiesSummary +----------+------------+---------+-----------+----------+-------+ Subclavian    Full       Yes       Yes                       +----------+------------+---------+-----------+----------+-------+  Summary:  Right: No evidence of deep vein thrombosis in the upper extremity. No evidence of superficial vein thrombosis in the upper extremity.  Left: No evidence of thrombosis in the subclavian.  *See table(s) above for measurements and observations.  Diagnosing physician: Harold Barban MD Electronically signed by Harold Barban MD on 02/17/2023 at 10:11:31 PM.    Final    DG FEMUR MIN 2 VIEWS LEFT  Result Date: 02/16/2023 CLINICAL DATA:  Postoperative EXAM: LEFT FEMUR 2 VIEWS COMPARISON:  Left hip x-ray 02/15/2023 FINDINGS: There is a new left femoral intramedullary nail and hip screw fixating comminuted intratrochanteric fracture. Alignment is anatomic. The lesser trochanter is displaced medially. There is no dislocation. There is lateral hip and leg soft tissue swelling, air and skin staples compatible with recent surgery. IMPRESSION: Left femoral intramedullary nail and hip screw fixating comminuted intratrochanteric fracture. Electronically Signed   By: Ronney Asters M.D.   On: 02/16/2023 21:17   DG FEMUR MIN 2 VIEWS LEFT  Result Date: 02/16/2023 CLINICAL DATA:  Intramedullary nail EXAM: LEFT FEMUR 2 VIEWS COMPARISON:  Hip x-ray 02/15/2023 FINDINGS: Nine intraoperative fluoroscopic views of the left hip. Left-sided hip screw and intramedullary nail fixating intratrochanteric fracture. Alignment is anatomic. Fluoroscopy time: 2 minutes and 56 seconds. Fluoroscopy dose 45.564 micro Seger. IMPRESSION: Left hip screw and intramedullary nail fixating intratrochanteric fracture. Electronically Signed   By: Ronney Asters M.D.   On: 02/16/2023 20:34   DG C-Arm 1-60 Min-No Report  Result Date: 02/16/2023 Fluoroscopy was utilized by the requesting physician.  No radiographic interpretation.   DG C-Arm 1-60 Min-No Report  Result Date: 02/16/2023 Fluoroscopy was utilized by the requesting physician.  No radiographic interpretation.   DG C-Arm  1-60 Min-No Report  Result Date: 02/16/2023 Fluoroscopy was utilized by the requesting physician.  No radiographic interpretation.   DG Knee 1-2 Views Left  Result Date: 02/16/2023 CLINICAL DATA:  Left knee pain EXAM: LEFT KNEE - 1-2 VIEW COMPARISON:  None Available. FINDINGS: Atypical frontal view due to knee being held in flexion. There is no obvious fracture involving the left knee on these atypical views. There is tricompartment degenerative change and a probable trace effusion. There is soft tissue swelling anteriorly along the proximal tibia and prepatellar soft tissues. IMPRESSION: Atypical frontal view without obvious fracture involving the left knee. Tricompartment degenerative change with probable trace effusion. Soft tissue swelling anteriorly along the proximal lower leg and prepatellar soft tissues. Electronically Signed   By: Maurine Simmering M.D.   On: 02/16/2023 16:51   DG Chest Portable 1 View  Result Date: 02/15/2023 CLINICAL DATA:  Hip fracture. EXAM: PORTABLE CHEST 1 VIEW COMPARISON:  Chest radiograph dated May 22, 2007 FINDINGS: The heart is normal in size. Atherosclerotic calcification of the aortic arch. Low lung volumes with left basilar opacity suggesting atelectasis or small effusion. Thoracic spondylosis. No acute osseous abnormality. IMPRESSION: Low lung volumes with left basilar opacity suggesting atelectasis or small effusion. Electronically Signed   By: Keane Police D.O.   On: 02/15/2023 22:07   DG Hip Unilat W or Wo Pelvis 2-3 Views Left  Result Date: 02/15/2023 CLINICAL DATA:  Recent fall with hip pain, initial encounter EXAM: DG HIP (WITH OR WITHOUT PELVIS) 3V LEFT COMPARISON:  None Available. FINDINGS: Pelvic ring appears intact. There is a significantly comminuted left intratrochanteric fracture with impaction and angulation at the fracture site. Femoral head is well seated. No soft tissue abnormality is noted. IMPRESSION: Severely comminuted left intratrochanteric fracture.  Electronically Signed   By:  Inez Catalina M.D.   On: 02/15/2023 22:05   CT HEAD WO CONTRAST (5MM)  Result Date: 02/15/2023 CLINICAL DATA:  Recent fall with headaches and neck pain, initial encounter EXAM: CT HEAD WITHOUT CONTRAST CT CERVICAL SPINE WITHOUT CONTRAST TECHNIQUE: Multidetector CT imaging of the head and cervical spine was performed following the standard protocol without intravenous contrast. Multiplanar CT image reconstructions of the cervical spine were also generated. RADIATION DOSE REDUCTION: This exam was performed according to the departmental dose-optimization program which includes automated exposure control, adjustment of the mA and/or kV according to patient size and/or use of iterative reconstruction technique. COMPARISON:  05/21/2007 FINDINGS: CT HEAD FINDINGS Brain: No evidence of acute infarction, hemorrhage, hydrocephalus, extra-axial collection or mass lesion/mass effect. Progressive atrophic changes are noted commenced with the patient's given age. Chronic white matter ischemic changes are seen. Small lacunar infarct in the right basal ganglia is noted. Vascular: No hyperdense vessel or unexpected calcification. Skull: Normal. Negative for fracture or focal lesion. Sinuses/Orbits: No acute finding. Other: None CT CERVICAL SPINE FINDINGS Alignment: Mild loss of normal cervical lordosis is noted. Skull base and vertebrae: 7 cervical segments are well visualized. Vertebral body height is well maintained. Multilevel osteophytic changes are seen. Multilevel disc space narrowing and facet hypertrophic changes are noted. No acute fracture or acute facet abnormality is noted. Soft tissues and spinal canal: Surrounding soft tissue structures demonstrate diffuse vascular calcifications. No hematoma or other focal abnormality is noted. Upper chest: Visualized lung apices are within normal limits. Other: None IMPRESSION: CT of the head: Progressive atrophic and ischemic changes commensurate with  the patient's given age. No acute abnormality noted. CT of the cervical spine: Multilevel degenerative change without acute abnormality. Electronically Signed   By: Inez Catalina M.D.   On: 02/15/2023 21:58   CT Cervical Spine Wo Contrast  Result Date: 02/15/2023 CLINICAL DATA:  Recent fall with headaches and neck pain, initial encounter EXAM: CT HEAD WITHOUT CONTRAST CT CERVICAL SPINE WITHOUT CONTRAST TECHNIQUE: Multidetector CT imaging of the head and cervical spine was performed following the standard protocol without intravenous contrast. Multiplanar CT image reconstructions of the cervical spine were also generated. RADIATION DOSE REDUCTION: This exam was performed according to the departmental dose-optimization program which includes automated exposure control, adjustment of the mA and/or kV according to patient size and/or use of iterative reconstruction technique. COMPARISON:  05/21/2007 FINDINGS: CT HEAD FINDINGS Brain: No evidence of acute infarction, hemorrhage, hydrocephalus, extra-axial collection or mass lesion/mass effect. Progressive atrophic changes are noted commenced with the patient's given age. Chronic white matter ischemic changes are seen. Small lacunar infarct in the right basal ganglia is noted. Vascular: No hyperdense vessel or unexpected calcification. Skull: Normal. Negative for fracture or focal lesion. Sinuses/Orbits: No acute finding. Other: None CT CERVICAL SPINE FINDINGS Alignment: Mild loss of normal cervical lordosis is noted. Skull base and vertebrae: 7 cervical segments are well visualized. Vertebral body height is well maintained. Multilevel osteophytic changes are seen. Multilevel disc space narrowing and facet hypertrophic changes are noted. No acute fracture or acute facet abnormality is noted. Soft tissues and spinal canal: Surrounding soft tissue structures demonstrate diffuse vascular calcifications. No hematoma or other focal abnormality is noted. Upper chest:  Visualized lung apices are within normal limits. Other: None IMPRESSION: CT of the head: Progressive atrophic and ischemic changes commensurate with the patient's given age. No acute abnormality noted. CT of the cervical spine: Multilevel degenerative change without acute abnormality. Electronically Signed   By: Linus Mako.D.  On: 02/15/2023 21:58    Assessment/Plan  1. Cellulitis of hip, left -  will start on Doxycycline - doxycycline (VIBRA-TABS) 100 MG tablet; Take 1 tablet (100 mg total) by mouth 2 (two) times daily for 7 days.  Dispense: 14 tablet; Refill: 0 - saccharomyces boulardii (FLORASTOR) 250 MG capsule; Take 1 capsule (250 mg total) by mouth 2 (two) times daily for 10 days.  Dispense: 20 capsule; Refill: 0 -  follow up with orthopedics  2. Do not resuscitate - Do not attempt resuscitation (DNR)    Family/ staff Communication: Discussed plan of care with resident and charge nurse  Labs/tests ordered: None    Durenda Age, DNP, MSN, FNP-BC [facility] [phone] (Monday-Friday 8:00 a.m. - 5:00 p.m.) [phone] (after hours)

## 2023-03-05 ENCOUNTER — Ambulatory Visit (INDEPENDENT_AMBULATORY_CARE_PROVIDER_SITE_OTHER): Payer: Medicare PPO | Admitting: Orthopedic Surgery

## 2023-03-05 ENCOUNTER — Other Ambulatory Visit (INDEPENDENT_AMBULATORY_CARE_PROVIDER_SITE_OTHER): Payer: Medicare PPO

## 2023-03-05 DIAGNOSIS — S72142A Displaced intertrochanteric fracture of left femur, initial encounter for closed fracture: Secondary | ICD-10-CM

## 2023-03-05 NOTE — Progress Notes (Signed)
Orthopedic Surgery Post-operative Office Visit  Procedure: left hip cephalomedullary rodding Date of Surgery: 02/16/2023  Assessment: Patient is a 89 y.o. who is doing as expected after cephalomedullary of left hip fracture   Plan: -Operative plans complete -Staples removed today in office -Aspirin 81mg  BID for dvt ppx -Weight bearing as tolerated  -Okay to let soap and water run over incision, do not submerge -Pain management: weaning Norco -Return to office in 4 weeks, x-rays needed at next visit: AP/lateral left femur  ___________________________________________________________________________   Subjective: Patient is in a SNF. Pain has been getting better with time. Is taking an occasional Norco but is not regularly taking anything for pain. The staff at the facility has not noted any drainage from the incisions. Has been ambulating with a walker. Facility reports she has walker 50-70 feet. She is working with PT.    Objective:  General: no acute distress, appropriate affect Neurologic: alert, answering questions intermittently, following commands Respiratory: unlabored breathing on room air Skin: incisions appear well approximated with staples in place, no active or expressible drainage, skin irritation around the staple sites but no expanding erythema  MSK (LLE):              Demonstrates ability to transfer in the office  Plantarflexes and dorsiflexes toes EHL/TA/GSC intact Sensation intact to light touch in sural, saphenous, tibial, deep peroneal, and superficial peroneal nerve distributions Foot warm and well perfused, palpable DP pulse  Imaging: X-rays of the left femur taken 03/05/2023 and independently reviewed today, showing comminuted pertrochanteric femur fracture. Lesser trochanter fracture is displaced medially. Slight valgus alignment. Screws still in the center of the femoral head.    Patient name: Janet [name] Patient MRN: [id number] Date of visit:  03/05/23

## 2023-03-06 ENCOUNTER — Encounter: Payer: Self-pay | Admitting: Orthopedic Surgery

## 2023-03-06 ENCOUNTER — Telehealth: Payer: Self-pay | Admitting: Orthopedic Surgery

## 2023-03-06 NOTE — Telephone Encounter (Signed)
Vivien Rota from [facility] called. Says patient still has staples in her hip. Wants to know if this was intended to stay or are they any orders for removal? Her call back number is [phone]

## 2023-03-09 NOTE — Telephone Encounter (Signed)
See message from From Dr. Laurance Flatten 03/06/23

## 2023-03-16 ENCOUNTER — Non-Acute Institutional Stay (SKILLED_NURSING_FACILITY): Payer: Medicare PPO | Admitting: Nurse Practitioner

## 2023-03-16 ENCOUNTER — Encounter: Payer: Self-pay | Admitting: Nurse Practitioner

## 2023-03-16 DIAGNOSIS — D62 Acute posthemorrhagic anemia: Secondary | ICD-10-CM | POA: Diagnosis not present

## 2023-03-16 DIAGNOSIS — E538 Deficiency of other specified B group vitamins: Secondary | ICD-10-CM

## 2023-03-16 DIAGNOSIS — R0600 Dyspnea, unspecified: Secondary | ICD-10-CM

## 2023-03-16 DIAGNOSIS — N1831 Chronic kidney disease, stage 3a: Secondary | ICD-10-CM

## 2023-03-16 DIAGNOSIS — I5032 Chronic diastolic (congestive) heart failure: Secondary | ICD-10-CM

## 2023-03-16 DIAGNOSIS — F0390 Unspecified dementia without behavioral disturbance: Secondary | ICD-10-CM | POA: Diagnosis not present

## 2023-03-16 DIAGNOSIS — F339 Major depressive disorder, recurrent, unspecified: Secondary | ICD-10-CM | POA: Diagnosis not present

## 2023-03-16 DIAGNOSIS — E782 Mixed hyperlipidemia: Secondary | ICD-10-CM

## 2023-03-16 DIAGNOSIS — K5901 Slow transit constipation: Secondary | ICD-10-CM | POA: Insufficient documentation

## 2023-03-16 NOTE — Assessment & Plan Note (Signed)
Post op anemia, transfused, Hgb 9.7 02/24/23, on Iron.

## 2023-03-16 NOTE — Assessment & Plan Note (Signed)
stable, on Mirtazapine, Sertraline, Lorazepam.  

## 2023-03-16 NOTE — Assessment & Plan Note (Signed)
Hx of 0000000, Grade I diastolic dysfunction [id number] per echocardiogram due to SOB. 08/18/22 Echo EF 75%, pulmonary hypertension, mild mitral/tricuspid valves insufficiency.

## 2023-03-16 NOTE — Assessment & Plan Note (Signed)
03/12/23 prn Senna hs is helpful.

## 2023-03-16 NOTE — Assessment & Plan Note (Signed)
Hx of grade I diastolic dysfunction 2018 per echocardiogram due to SOB, 08/18/22 EF 75%, pulmonary hypertension, mild tricuspid/mitral valve insufficiency, on Furosemide.  

## 2023-03-16 NOTE — Assessment & Plan Note (Signed)
takes Vit B12, Vit B12 1010 03/02/20, Hgb 9.7 02/24/23

## 2023-03-16 NOTE — Assessment & Plan Note (Signed)
not taking statin, LDL 64 02/11/22, takes ASA 81mg qd, refused statin.  

## 2023-03-16 NOTE — Progress Notes (Unsigned)
Location:   [facility] Room Number: 26A Place of Service:  SNF (31) Provider: Lennie Odor Brandalynn Ofallon NP  Avie Checo X, NP  Patient Care Team: Nikelle Malatesta X, NP as PCP - General (Internal Medicine) [name], MD as Referring Physician (Physical Medicine and Rehabilitation) Lajean Manes, MD as Consulting Physician (Internal Medicine)  Extended Emergency Contact Information Primary Emergency Contact: [name], [place] Phone: [phone] Relation: Daughter Secondary Emergency Contact: Myers,Lois  [place] Phone: [phone] Relation: Daughter  Code Status:  DNR Goals of care: Advanced Directive information    03/04/2023    3:37 PM  Advanced Directives  Does Patient Have a Medical Advance Directive? Yes  Type of Paramedic of [place];Living will;Out of facility DNR (pink MOST or yellow form)  Does patient want to make changes to medical advance directive? No - Patient declined  Copy of [place] in Chart? Yes - validated most recent copy scanned in chart (See row information)  Pre-existing out of facility DNR order (yellow form or pink MOST form) Yellow form placed in chart (order not valid for inpatient use);Pink MOST form placed in chart (order not valid for inpatient use)     Chief Complaint  Patient presents with   Medical Management of Chronic Issues    Patient is being seen for routine visit     HPI:  Pt is a 89 y.o. female seen today for medical management of chronic diseases.        Hospitalized 02/15/23-02/19/23 for left hip fx sustained from a ground fall, ORIF IM nail, healed. Taking Tylenol, Norco for pain. Alk phos 223 03/12/23             Post op anemia, transfused, Hgb 9.7 02/24/23, on Iron.  The  patient's swelling ankles/BLE, mostly right, 08/04/22 negative DVT, BNP 51 8/31/2 Hx of grade I diastolic dysfunction 99991111 per echocardiogram due to SOB,  08/18/22 EF 75%, pulmonary hypertension, mild tricuspid/mitral valve insufficiency, on Furosemide.  Depression/anxiety, stable, on Mirtazapine, Sertraline, Lorazepam.  Hx of Hyperlipidemia, not taking statin, LDL 64 02/11/22, takes ASA 81mg  qd, refused statin. CKD Bun/creat 17/2.0 02/24/23 Vit B12 deficiency, takes Vit B12, Vit B12 1010 03/02/20, Hgb 9.7 02/24/23 Dementia, MMSE 19/30 12/22,  takes Exelon, Memantine, resides SNF [facility] for safety, care assistance. underwent Neurology consultation. Dyspnea, Hx of 0000000, Grade I diastolic dysfunction 99991111 per echocardiogram due to SOB. 08/18/22 Echo EF 75%, pulmonary hypertension, mild mitral/tricuspid valves insufficiency.                             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   Dyslipidemia    Fibromyalgia    Granuloma annulare    History of uterine cancer 2003   treated with hysterectomy   Lumbar radicular syndrome    left L5   Macular degeneration    left eye   Sciatica    TIA (transient ischemic attack)    Past Surgical History:  Procedure Laterality Date   ABDOMINAL HYSTERECTOMY     BREAST BIOPSY  2007   CATARACT EXTRACTION  INTRAMEDULLARY (IM) NAIL INTERTROCHANTERIC Left 02/16/2023   Procedure: INTRAMEDULLARY (IM) NAIL INTERTROCHANTERIC;  Surgeon: Callie Fielding, MD;  Location: [facility];  Service: Orthopedics;  Laterality: Left;   TONSILLECTOMY      Allergies  Allergen Reactions   Aricept [Donepezil Hcl]     Muscle cramps   Latex Itching   Tramadol Other (See Comments)    Pt can't remember what side effects she had, she doesn't take it now    Allergies as of 03/16/2023       Reactions   Aricept [donepezil Hcl]    Muscle cramps   Latex Itching   Tramadol Other (See Comments)   Pt can't remember what  side effects she had, she doesn't take it now        Medication List        Accurate as of March 16, 2023  2:39 PM. If you have any questions, ask your nurse or doctor.          acetaminophen 325 MG tablet Commonly known as: TYLENOL Take 650 mg by mouth 2 (two) times daily. Noon and bedtime   aspirin EC 81 MG tablet Take 1 tablet (81 mg total) by mouth 2 (two) times daily.   CALCIUM-VITAMIN D PO Take 1 tablet by mouth daily. 600mg  calcium, 800 units Vit D   ferrous sulfate 325 (65 FE) MG EC tablet Take 325 mg by mouth once. Every Mon, Wed, and Fri   furosemide 20 MG tablet Commonly known as: LASIX Take 20 mg by mouth every Monday, Wednesday, and Friday.   HYDROcodone-acetaminophen 7.5-325 mg/15 ml solution Commonly known as: HYCET Take 15 mLs by mouth every 6 (six) hours as needed for moderate pain.   lactose free nutrition Liqd Take 237 mLs by mouth 2 (two) times daily.   LORazepam 0.5 MG tablet Commonly known as: ATIVAN Take 1 tablet (0.5 mg total) by mouth daily as needed (dementia).   melatonin 3 MG Tabs tablet Take 3 mg by mouth at bedtime.   memantine 5 MG tablet Commonly known as: NAMENDA Take by mouth 2 (two) times daily.   mirtazapine 30 MG tablet Commonly known as: REMERON TAKE ONE TABLET AT BEDTIME.   pantoprazole 40 MG tablet Commonly known as: PROTONIX Take 1 tablet (40 mg total) by mouth daily.   potassium chloride SA 20 MEQ tablet Commonly known as: KLOR-CON M Take 20 mEq by mouth every Monday, Wednesday, and Friday.   rivastigmine 1.5 MG capsule Commonly known as: EXELON Take 1 capsule (1.5 mg total) by mouth 2 (two) times daily.   senna 8.6 MG Tabs tablet Commonly known as: SENOKOT Take 1 tablet (8.6 mg total) by mouth at bedtime.   [employer] Senior Tabs Take 2 tablets by mouth daily.   sertraline 50 MG tablet Commonly known as: ZOLOFT Take 1 tablet (50 mg total) by mouth daily.   vitamin B-12 500 MCG tablet Commonly known as:  CYANOCOBALAMIN Take 500 mcg by mouth daily.        Review of Systems  Constitutional:  Negative for appetite change, fatigue and fever.  HENT:  Positive for hearing loss. Negative for congestion, trouble swallowing and voice change.   Eyes:  Negative for visual disturbance.  Respiratory:  Negative for cough and shortness of breath.        ?DOE  Cardiovascular:  Positive for leg swelling.  Gastrointestinal:  Negative for abdominal pain and constipation.  Genitourinary:  Negative for dysuria, frequency and urgency.  Musculoskeletal:  Positive  for gait problem.  Skin:  Negative for color change.  Neurological:  Negative for speech difficulty and headaches.       Dementia  Psychiatric/Behavioral:  Negative for behavioral problems and sleep disturbance. The patient is nervous/anxious.        Resistance to assistance at times.     Immunization History  Administered Date(s) Administered   Influenza, High Dose Seasonal PF 09/23/2017, 09/28/2019   Influenza,inj,Quad PF,6+ Mos 09/16/2018   Influenza-Unspecified 09/14/2017, 09/26/2020, 10/03/2021, 10/06/2022   Moderna SARS-COV2 Booster Vaccination 10/23/2020, 05/14/2021   Moderna Sars-Covid-2 Vaccination 12/19/2019, 01/16/2020, 05/14/2021   Pneumococcal Conjugate-13 02/21/2014   Pneumococcal Polysaccharide-23 02/21/2002   Pneumococcal-Unspecified 12/15/2001   Tdap 12/15/2009, 10/13/2022   Unspecified SARS-COV-2 Vaccination 10/16/2022   Zoster Recombinat (Shingrix) 12/15/2005   Zoster, Unspecified 12/15/2005   Pertinent  Health Maintenance Due  Topic Date Due   INFLUENZA VACCINE  07/16/2023   DEXA SCAN  Completed      09/21/2018    4:01 PM 12/29/2018    4:11 PM 02/08/2019    1:54 PM 03/07/2020   12:54 PM 02/20/2023    2:52 PM  Fall Risk  Falls in the past year? No 0 0 0 1  Was there an injury with Fall?  0 0  1  Fall Risk Category Calculator  0 0  3  Fall Risk Category (Retired)  Low Low    (RETIRED) Patient Fall Risk Level  Low  fall risk Low fall risk    Patient at Risk for Falls Due to     History of fall(s)  Fall risk Follow up     Falls evaluation completed   Functional Status Survey:    There were no vitals filed for this visit. There is no height or weight on file to calculate BMI. Physical Exam Vitals and nursing note reviewed.  Constitutional:      Appearance: Normal appearance.  HENT:     Head: Normocephalic and atraumatic.     Mouth/Throat:     Mouth: Mucous membranes are moist.  Eyes:     Extraocular Movements: Extraocular movements intact.     Conjunctiva/sclera: Conjunctivae normal.     Pupils: Pupils are equal, round, and reactive to light.  Cardiovascular:     Rate and Rhythm: Normal rate and regular rhythm.     Heart sounds: No murmur heard. Pulmonary:     Effort: Pulmonary effort is normal.     Breath sounds: No rales.  Abdominal:     General: Bowel sounds are normal.     Palpations: Abdomen is soft.     Tenderness: There is no abdominal tenderness.  Musculoskeletal:     Cervical back: Normal range of motion and neck supple.     Right lower leg: No edema.     Left lower leg: Edema present.     Comments: Near none  Skin:    General: Skin is warm and dry.     Comments: Left hip surgical incision healed.   Neurological:     General: No focal deficit present.     Mental Status: She is alert. Mental status is at baseline.     Motor: No weakness.     Coordination: Coordination normal.     Gait: Gait abnormal.     Comments: Oriented to person, place.   Psychiatric:        Mood and Affect: Mood normal.        Behavior: Behavior normal.     Comments: Upon my  visit today, conversing, smiling     Labs reviewed: Recent Labs    02/17/23 0352 02/18/23 0746 02/19/23 0937 02/24/23 0000  NA 132* 134* 135 138  K 4.3 3.6 3.5 4.5  CL 103 103 106 107  CO2 24 24 22 21   GLUCOSE 157* 127* 110*  --   BUN 24* 24* 18 17  CREATININE 1.29* 1.19* 0.97 1.0  CALCIUM 7.6* 7.7* 7.7* 8.3*   MG  --   --  2.6*  --    Recent Labs    02/15/23 2058 02/19/23 0937 02/24/23 0000  AST 24 35 57*  ALT 21 13 41*  ALKPHOS 54 49 75  BILITOT 0.5 0.9  --   PROT 7.0 5.7*  --   ALBUMIN 3.8 2.9* 3.5   Recent Labs    09/04/22 0000 02/15/23 2058 02/16/23 0700 02/17/23 0701 02/17/23 1319 02/18/23 0746 02/19/23 0937 02/24/23 0000  WBC 5.1 10.9*   < > 9.9  --  10.1 10.2 11.1  NEUTROABS 2,938.00 7.8*  --   --   --   --   --  8,181.00  HGB 12.2 11.9*   < > 6.7*   < > 7.4* 9.2* 9.7*  HCT 36 35.7*   < > 20.4*   < > 22.7* 27.7* 29*  MCV  --  97.0   < > 100.5*  --  99.6 95.8  --   PLT  --  247   < > 155  --  130* 153 359   < > = values in this interval not displayed.   Lab Results  Component Value Date   TSH 4.58 04/02/2022   No results found for: "HGBA1C" Lab Results  Component Value Date   CHOL 217 (A) 03/29/2021   HDL 48 03/29/2021   LDLCALC 152 03/29/2021   TRIG 70 03/29/2021   CHOLHDL 4.7 08/01/2019    Significant Diagnostic Results in last 30 days:  VAS [place] UPPER EXTREMITY VENOUS DUPLEX  Result Date: 02/17/2023 UPPER VENOUS STUDY  Patient Name:  ELLENY ZUVER  Date of Exam:   02/17/2023 Medical Rec #: [id number]     Accession #:    [id number] Date of Birth: 08/12/1932      Patient Gender: F Patient Age:   27 years Exam Location:  [facility] Procedure:      VAS [place] UPPER EXTREMITY VENOUS DUPLEX Referring Phys: Vance Gather --------------------------------------------------------------------------------  Indications: Swelling Risk Factors: None identified. Limitations: Poor ultrasound/tissue interface. Comparison Study: No prior studies. Performing Technologist: Oliver Hum RVT  Examination Guidelines: A complete evaluation includes B-mode imaging, spectral Doppler, color Doppler, and power Doppler as needed of all accessible portions of each vessel. Bilateral testing is considered an integral part of a complete examination. Limited examinations for reoccurring indications  may be performed as noted.  Right Findings: +----------+------------+---------+-----------+----------+-------+ RIGHT     CompressiblePhasicitySpontaneousPropertiesSummary +----------+------------+---------+-----------+----------+-------+ IJV           Full       Yes       Yes                      +----------+------------+---------+-----------+----------+-------+ Subclavian    Full       Yes       No                       +----------+------------+---------+-----------+----------+-------+ Axillary      Full       Yes  No                       +----------+------------+---------+-----------+----------+-------+ Brachial      Full       Yes       Yes                      +----------+------------+---------+-----------+----------+-------+ Radial        Full                                          +----------+------------+---------+-----------+----------+-------+ Ulnar         Full                                          +----------+------------+---------+-----------+----------+-------+ Cephalic      Full                                          +----------+------------+---------+-----------+----------+-------+ Basilic       Full                                          +----------+------------+---------+-----------+----------+-------+  Left Findings: +----------+------------+---------+-----------+----------+-------+ LEFT      CompressiblePhasicitySpontaneousPropertiesSummary +----------+------------+---------+-----------+----------+-------+ Subclavian    Full       Yes       Yes                      +----------+------------+---------+-----------+----------+-------+  Summary:  Right: No evidence of deep vein thrombosis in the upper extremity. No evidence of superficial vein thrombosis in the upper extremity.  Left: No evidence of thrombosis in the subclavian.  *See table(s) above for measurements and observations.  Diagnosing physician: Harold Barban MD Electronically signed by Harold Barban MD on 02/17/2023 at 10:11:31 PM.    Final    DG FEMUR MIN 2 VIEWS LEFT  Result Date: 02/16/2023 CLINICAL DATA:  Postoperative EXAM: LEFT FEMUR 2 VIEWS COMPARISON:  Left hip x-ray 02/15/2023 FINDINGS: There is a new left femoral intramedullary nail and hip screw fixating comminuted intratrochanteric fracture. Alignment is anatomic. The lesser trochanter is displaced medially. There is no dislocation. There is lateral hip and leg soft tissue swelling, air and skin staples compatible with recent surgery. IMPRESSION: Left femoral intramedullary nail and hip screw fixating comminuted intratrochanteric fracture. Electronically Signed   By: Ronney Asters M.D.   On: 02/16/2023 21:17   DG FEMUR MIN 2 VIEWS LEFT  Result Date: 02/16/2023 CLINICAL DATA:  Intramedullary nail EXAM: LEFT FEMUR 2 VIEWS COMPARISON:  Hip x-ray 02/15/2023 FINDINGS: Nine intraoperative fluoroscopic views of the left hip. Left-sided hip screw and intramedullary nail fixating intratrochanteric fracture. Alignment is anatomic. Fluoroscopy time: 2 minutes and 56 seconds. Fluoroscopy dose 45.564 micro Franko. IMPRESSION: Left hip screw and intramedullary nail fixating intratrochanteric fracture. Electronically Signed   By: Ronney Asters M.D.   On: 02/16/2023 20:34   DG C-Arm 1-60 Min-No Report  Result Date: 02/16/2023 Fluoroscopy was utilized by the requesting physician.  No radiographic interpretation.   DG C-Arm 1-60 Min-No Report  Result  Date: 02/16/2023 Fluoroscopy was utilized by the requesting physician.  No radiographic interpretation.   DG C-Arm 1-60 Min-No Report  Result Date: 02/16/2023 Fluoroscopy was utilized by the requesting physician.  No radiographic interpretation.   DG Knee 1-2 Views Left  Result Date: 02/16/2023 CLINICAL DATA:  Left knee pain EXAM: LEFT KNEE - 1-2 VIEW COMPARISON:  None Available. FINDINGS: Atypical frontal view due to knee being held in flexion. There is no  obvious fracture involving the left knee on these atypical views. There is tricompartment degenerative change and a probable trace effusion. There is soft tissue swelling anteriorly along the proximal tibia and prepatellar soft tissues. IMPRESSION: Atypical frontal view without obvious fracture involving the left knee. Tricompartment degenerative change with probable trace effusion. Soft tissue swelling anteriorly along the proximal lower leg and prepatellar soft tissues. Electronically Signed   By: Maurine Simmering M.D.   On: 02/16/2023 16:51   DG Chest Portable 1 View  Result Date: 02/15/2023 CLINICAL DATA:  Hip fracture. EXAM: PORTABLE CHEST 1 VIEW COMPARISON:  Chest radiograph dated May 22, 2007 FINDINGS: The heart is normal in size. Atherosclerotic calcification of the aortic arch. Low lung volumes with left basilar opacity suggesting atelectasis or small effusion. Thoracic spondylosis. No acute osseous abnormality. IMPRESSION: Low lung volumes with left basilar opacity suggesting atelectasis or small effusion. Electronically Signed   By: Keane Police D.O.   On: 02/15/2023 22:07   DG Hip Unilat W or Wo Pelvis 2-3 Views Left  Result Date: 02/15/2023 CLINICAL DATA:  Recent fall with hip pain, initial encounter EXAM: DG HIP (WITH OR WITHOUT PELVIS) 3V LEFT COMPARISON:  None Available. FINDINGS: Pelvic ring appears intact. There is a significantly comminuted left intratrochanteric fracture with impaction and angulation at the fracture site. Femoral head is well seated. No soft tissue abnormality is noted. IMPRESSION: Severely comminuted left intratrochanteric fracture. Electronically Signed   By: Inez Catalina M.D.   On: 02/15/2023 22:05   CT HEAD WO CONTRAST (5MM)  Result Date: 02/15/2023 CLINICAL DATA:  Recent fall with headaches and neck pain, initial encounter EXAM: CT HEAD WITHOUT CONTRAST CT CERVICAL SPINE WITHOUT CONTRAST TECHNIQUE: Multidetector CT imaging of the head and cervical spine was performed  following the standard protocol without intravenous contrast. Multiplanar CT image reconstructions of the cervical spine were also generated. RADIATION DOSE REDUCTION: This exam was performed according to the departmental dose-optimization program which includes automated exposure control, adjustment of the mA and/or kV according to patient size and/or use of iterative reconstruction technique. COMPARISON:  05/21/2007 FINDINGS: CT HEAD FINDINGS Brain: No evidence of acute infarction, hemorrhage, hydrocephalus, extra-axial collection or mass lesion/mass effect. Progressive atrophic changes are noted commenced with the patient's given age. Chronic white matter ischemic changes are seen. Small lacunar infarct in the right basal ganglia is noted. Vascular: No hyperdense vessel or unexpected calcification. Skull: Normal. Negative for fracture or focal lesion. Sinuses/Orbits: No acute finding. Other: None CT CERVICAL SPINE FINDINGS Alignment: Mild loss of normal cervical lordosis is noted. Skull base and vertebrae: 7 cervical segments are well visualized. Vertebral body height is well maintained. Multilevel osteophytic changes are seen. Multilevel disc space narrowing and facet hypertrophic changes are noted. No acute fracture or acute facet abnormality is noted. Soft tissues and spinal canal: Surrounding soft tissue structures demonstrate diffuse vascular calcifications. No hematoma or other focal abnormality is noted. Upper chest: Visualized lung apices are within normal limits. Other: None IMPRESSION: CT of the head: Progressive atrophic and ischemic changes commensurate with the  patient's given age. No acute abnormality noted. CT of the cervical spine: Multilevel degenerative change without acute abnormality. Electronically Signed   By: Inez Catalina M.D.   On: 02/15/2023 21:58   CT Cervical Spine Wo Contrast  Result Date: 02/15/2023 CLINICAL DATA:  Recent fall with headaches and neck pain, initial encounter EXAM:  CT HEAD WITHOUT CONTRAST CT CERVICAL SPINE WITHOUT CONTRAST TECHNIQUE: Multidetector CT imaging of the head and cervical spine was performed following the standard protocol without intravenous contrast. Multiplanar CT image reconstructions of the cervical spine were also generated. RADIATION DOSE REDUCTION: This exam was performed according to the departmental dose-optimization program which includes automated exposure control, adjustment of the mA and/or kV according to patient size and/or use of iterative reconstruction technique. COMPARISON:  05/21/2007 FINDINGS: CT HEAD FINDINGS Brain: No evidence of acute infarction, hemorrhage, hydrocephalus, extra-axial collection or mass lesion/mass effect. Progressive atrophic changes are noted commenced with the patient's given age. Chronic white matter ischemic changes are seen. Small lacunar infarct in the right basal ganglia is noted. Vascular: No hyperdense vessel or unexpected calcification. Skull: Normal. Negative for fracture or focal lesion. Sinuses/Orbits: No acute finding. Other: None CT CERVICAL SPINE FINDINGS Alignment: Mild loss of normal cervical lordosis is noted. Skull base and vertebrae: 7 cervical segments are well visualized. Vertebral body height is well maintained. Multilevel osteophytic changes are seen. Multilevel disc space narrowing and facet hypertrophic changes are noted. No acute fracture or acute facet abnormality is noted. Soft tissues and spinal canal: Surrounding soft tissue structures demonstrate diffuse vascular calcifications. No hematoma or other focal abnormality is noted. Upper chest: Visualized lung apices are within normal limits. Other: None IMPRESSION: CT of the head: Progressive atrophic and ischemic changes commensurate with the patient's given age. No acute abnormality noted. CT of the cervical spine: Multilevel degenerative change without acute abnormality. Electronically Signed   By: Inez Catalina M.D.   On: 02/15/2023 21:58     Assessment/Plan  Slow transit constipation 03/12/23 prn Senna hs is helpful.   Chronic diastolic CHF (congestive heart failure) (HCC) Hx of grade I diastolic dysfunction 99991111 per echocardiogram due to SOB, 08/18/22 EF 75%, pulmonary hypertension, mild tricuspid/mitral valve insufficiency, on Furosemide.   Depression, recurrent ([place]) stable, on Mirtazapine, Sertraline, Lorazepam.   Hyperlipidemia not taking statin, LDL 64 02/11/22, takes ASA 81mg  qd, refused statin.  Stage 3a chronic kidney disease (CKD) ([place]) Bun/creat 17/2.0 02/24/23  Vitamin B12 deficiency  takes Vit B12, Vit B12 1010 03/02/20, Hgb 9.7 02/24/23  Senile dementia ([place]) MMSE 19/30 12/22,  takes Exelon, Memantine, resides SNF [facility] for safety, care assistance. underwent Neurology consultation.  Dyspnea  Hx of 0000000, Grade I diastolic dysfunction [id number] per echocardiogram due to SOB. 08/18/22 Echo EF 75%, pulmonary hypertension, mild mitral/tricuspid valves insufficiency.               Family/ staff Communication: plan of care reviewed with the patient and charge nurse.   Labs/tests ordered:  none  Time spend 35 minutes.

## 2023-03-16 NOTE — Assessment & Plan Note (Signed)
MMSE 19/30 12/22,  takes Exelon, Memantine, resides SNF [facility] for safety, care assistance. underwent Neurology consultation.

## 2023-03-16 NOTE — Assessment & Plan Note (Signed)
Bun/creat 17/2.0 02/24/23

## 2023-03-17 ENCOUNTER — Encounter: Payer: Self-pay | Admitting: Nurse Practitioner

## 2023-03-18 NOTE — Telephone Encounter (Signed)
Message routed to Mast, Man X, NP

## 2023-03-19 ENCOUNTER — Encounter: Payer: Self-pay | Admitting: Nurse Practitioner

## 2023-03-19 DIAGNOSIS — R945 Abnormal results of liver function studies: Secondary | ICD-10-CM | POA: Insufficient documentation

## 2023-03-26 DIAGNOSIS — I10 Essential (primary) hypertension: Secondary | ICD-10-CM | POA: Diagnosis not present

## 2023-04-02 ENCOUNTER — Ambulatory Visit: Payer: Medicare PPO | Admitting: Orthopedic Surgery

## 2023-04-07 ENCOUNTER — Telehealth: Payer: Self-pay | Admitting: Orthopedic Surgery

## 2023-04-07 NOTE — Telephone Encounter (Signed)
[facility] called needing to know when does the patient stop taking her Aspirin please advise call [phone] ask for Nurse

## 2023-04-08 NOTE — Telephone Encounter (Signed)
I called and lmom advising them of Dr. Kathi Der message

## 2023-04-09 DIAGNOSIS — I10 Essential (primary) hypertension: Secondary | ICD-10-CM | POA: Diagnosis not present

## 2023-04-09 LAB — HEPATIC FUNCTION PANEL
ALT: 18 U/L (ref 7–35)
AST: 21 (ref 13–35)
Alkaline Phosphatase: 132 — AB (ref 25–125)
Bilirubin, Direct: 0.1
Bilirubin, Total: 0.4

## 2023-04-09 LAB — COMPREHENSIVE METABOLIC PANEL
Albumin: 4.1 (ref 3.5–5.0)
Globulin: 2.7

## 2023-04-10 ENCOUNTER — Ambulatory Visit (INDEPENDENT_AMBULATORY_CARE_PROVIDER_SITE_OTHER): Payer: Medicare PPO | Admitting: Orthopedic Surgery

## 2023-04-10 ENCOUNTER — Other Ambulatory Visit (INDEPENDENT_AMBULATORY_CARE_PROVIDER_SITE_OTHER): Payer: Medicare PPO

## 2023-04-10 DIAGNOSIS — S72142A Displaced intertrochanteric fracture of left femur, initial encounter for closed fracture: Secondary | ICD-10-CM

## 2023-04-10 DIAGNOSIS — F330 Major depressive disorder, recurrent, mild: Secondary | ICD-10-CM | POA: Diagnosis not present

## 2023-04-10 DIAGNOSIS — F03A0 Unspecified dementia, mild, without behavioral disturbance, psychotic disturbance, mood disturbance, and anxiety: Secondary | ICD-10-CM | POA: Diagnosis not present

## 2023-04-10 NOTE — Progress Notes (Signed)
Orthopedic Surgery Post-operative Office Visit   Procedure: left hip cephalomedullary rodding Date of Surgery: 02/16/2023 (~6 weeks post-op)   Assessment: Patient is a 89 y.o. who is making progress in terms of pain and ambulation after cephalomedullary rodding of her left hip fracture     Plan: -Operative plans complete -Okay to submerge wound at this point -Weight bearing as tolerated, encouraged ambulation at her facility -Can resume home daily ASA -Pain management: OTC medications as needed -Return to office in 6 weeks, x-rays needed at next visit: AP/lateral left femur   ___________________________________________________________________________     Subjective: Patient has been doing well since last time I saw her.  She is still at a SNF.  She is ambulating with a walker.  Her pain has improved significantly since out last visit. She does not have any pain when walking with a walker but does note pain in her hip when she goes from a seated to standing position.  She is not taking any medication for pain at this time. Denies paresthesias and numbness. Has not noticed any redness or drainage around her incisions.    Objective:   General: no acute distress, appropriate affect Neurologic: alert, answering questions intermittently, following commands Respiratory: unlabored breathing on room air Skin: incisions are well healed, no erythema seen, no induration, no drainage seen   MSK (LLE):               No pain with logroll Plantarflexes and dorsiflexes toes EHL/TA/GSC intact Sensation intact to light touch in sural, saphenous, tibial, deep peroneal, and superficial peroneal nerve distributions Foot warm and well perfused, palpable DP pulse   Imaging: X-rays of the left femur taken 04/10/2023 and independently reviewed today, showing comminuted pertrochanteric femur fracture. Lesser trochanter fracture is displaced medially. Slight valgus alignment. There has been interval callus  formation noted on the AP view medial to the rod. The lag screws are centered within the femoral head. No evidence of screw cut out.      Patient name: Janet [name] Patient MRN: [id number] Date of visit: 04/10/23

## 2023-04-14 ENCOUNTER — Non-Acute Institutional Stay (SKILLED_NURSING_FACILITY): Payer: Medicare PPO | Admitting: Nurse Practitioner

## 2023-04-14 ENCOUNTER — Encounter: Payer: Self-pay | Admitting: Nurse Practitioner

## 2023-04-14 DIAGNOSIS — E538 Deficiency of other specified B group vitamins: Secondary | ICD-10-CM | POA: Diagnosis not present

## 2023-04-14 DIAGNOSIS — I5189 Other ill-defined heart diseases: Secondary | ICD-10-CM

## 2023-04-14 DIAGNOSIS — F0390 Unspecified dementia without behavioral disturbance: Secondary | ICD-10-CM | POA: Diagnosis not present

## 2023-04-14 DIAGNOSIS — I5032 Chronic diastolic (congestive) heart failure: Secondary | ICD-10-CM | POA: Diagnosis not present

## 2023-04-14 DIAGNOSIS — E782 Mixed hyperlipidemia: Secondary | ICD-10-CM | POA: Diagnosis not present

## 2023-04-14 DIAGNOSIS — M25552 Pain in left hip: Secondary | ICD-10-CM | POA: Insufficient documentation

## 2023-04-14 DIAGNOSIS — F339 Major depressive disorder, recurrent, unspecified: Secondary | ICD-10-CM | POA: Diagnosis not present

## 2023-04-14 DIAGNOSIS — T148XXA Other injury of unspecified body region, initial encounter: Secondary | ICD-10-CM

## 2023-04-14 DIAGNOSIS — D62 Acute posthemorrhagic anemia: Secondary | ICD-10-CM

## 2023-04-14 DIAGNOSIS — N1831 Chronic kidney disease, stage 3a: Secondary | ICD-10-CM | POA: Diagnosis not present

## 2023-04-14 NOTE — Assessment & Plan Note (Signed)
takes Vit B12, Vit B12 1010 03/02/20, Hgb 9.7 02/24/23 

## 2023-04-14 NOTE — Assessment & Plan Note (Signed)
not taking statin, LDL 64 02/11/22, takes ASA 81mg qd, refused statin.  

## 2023-04-14 NOTE — Assessment & Plan Note (Signed)
left hip fx, s/p  ORIF IM nail, healed. Alk phos 223 03/12/23>>132 04/09/23, desires Voltaren topical for pain.

## 2023-04-14 NOTE — Assessment & Plan Note (Signed)
Bun/creat 17/2.0 02/24/23 

## 2023-04-14 NOTE — Assessment & Plan Note (Signed)
stable, on Mirtazapine, Sertraline, Depakote, Lorazepam.

## 2023-04-14 NOTE — Assessment & Plan Note (Signed)
Hx of 01/08/2017, Grade I diastolic dysfunction 2018 per echocardiogram due to SOB. 08/18/22 Echo EF 75%, pulmonary hypertension, mild mitral/tricuspid valves insufficiency.              

## 2023-04-14 NOTE — Assessment & Plan Note (Signed)
MMSE 19/30 12/22,  takes Exelon, Memantine, resides SNF [facility] for safety, care assistance. underwent Neurology consultation. 

## 2023-04-14 NOTE — Assessment & Plan Note (Signed)
Hx of grade I diastolic dysfunction 2018 per echocardiogram due to SOB, 08/18/22 EF 75%, pulmonary hypertension, mild tricuspid/mitral valve insufficiency, on Furosemide.  

## 2023-04-14 NOTE — Assessment & Plan Note (Signed)
Post op anemia, transfused, Hgb 9.7 02/24/23, on Iron.  

## 2023-04-14 NOTE — Progress Notes (Signed)
This encounter was created in error - please disregard.

## 2023-04-14 NOTE — Progress Notes (Signed)
Location:  [facility] Room Number: 26-A Place of Service:  SNF (31) Provider:  Inis Borneman Dicie Beam    Patient Care Team: Odessie Polzin X, NP as PCP - General (Internal Medicine) Romero Belling, MD as Referring Physician (Physical Medicine and Rehabilitation) Merlene Laughter, MD as Consulting Physician (Internal Medicine)  Extended Emergency Contact Information Primary Emergency Contact: Soyla Dryer, [place] Home Phone: [phone] Relation: Daughter Secondary Emergency Contact: Myers,Lois  [place] Home Phone: [phone] Relation: Daughter  Code Status:  DNR Goals of care: Advanced Directive information    04/14/2023    3:44 PM  Advanced Directives  Does Patient Have a Medical Advance Directive? Yes  Type of Estate agent of [place];Living will;Out of facility DNR (pink MOST or yellow form)  Does patient want to make changes to medical advance directive? No - Patient declined  Copy of Healthcare Power of Attorney in Chart? Yes - validated most recent copy scanned in chart (See row information)  Pre-existing out of facility DNR order (yellow form or pink MOST form) Pink MOST/Yellow Form most recent copy in chart - Physician notified to receive inpatient order     Chief Complaint  Patient presents with   Routine    HPI:  Pt is a 89 y.o. female seen today for medical management of chronic diseases.    Hospitalized 02/15/23-02/19/23 for left hip fx sustained from a ground fall, ORIF IM nail, healed. Taking Tylenol, Norco for pain. Alk phos 223 03/12/23>>132 04/09/23             Post op anemia, transfused, Hgb 9.7 02/24/23, on Iron.  The  patient's swelling ankles/BLE, mostly right, 08/04/22 negative DVT, BNP 51 8/31/2 Hx of grade I diastolic dysfunction 2018 per echocardiogram due to SOB, 08/18/22 EF 75%, pulmonary hypertension, mild tricuspid/mitral valve insufficiency, on Furosemide.   Depression/anxiety, stable, on Mirtazapine, Sertraline, Depakote, Lorazepam.  Hx of Hyperlipidemia, not taking statin, LDL 64 02/11/22, takes ASA 81mg  qd, refused statin. CKD Bun/creat 17/2.0 02/24/23 Vit B12 deficiency, takes Vit B12, Vit B12 1010 03/02/20, Hgb 9.7 02/24/23 Dementia, MMSE 19/30 12/22,  takes Exelon, Memantine, resides SNF [facility] for safety, care assistance. underwent Neurology consultation. Dyspnea, Hx of 01/08/2017, Grade I diastolic dysfunction 2018 per echocardiogram due to SOB. 08/18/22 Echo EF 75%, pulmonary hypertension, mild mitral/tricuspid valves insufficiency.                                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   Dyslipidemia    Fibromyalgia    Granuloma annulare    History of uterine cancer 2003   treated with hysterectomy   Lumbar radicular syndrome    left L5   Macular degeneration    left eye   Sciatica    TIA (transient ischemic attack)    Past Surgical History:  Procedure Laterality Date   ABDOMINAL HYSTERECTOMY     BREAST BIOPSY  2007   CATARACT EXTRACTION     INTRAMEDULLARY (IM) NAIL INTERTROCHANTERIC Left 02/16/2023   Procedure: INTRAMEDULLARY (IM) NAIL INTERTROCHANTERIC;  Surgeon: London Sheer, MD;  Location: [facility];  Service: Orthopedics;  Laterality: Left;   TONSILLECTOMY      Allergies  Allergen Reactions   Aricept [Donepezil Hcl]     Muscle cramps   Latex Itching   Tramadol Other (See Comments)    Pt can't remember what side effects she had, she doesn't take it now    Outpatient Encounter Medications as of 04/14/2023  Medication Sig   acetaminophen (TYLENOL) 325 MG tablet Take 650 mg by mouth 2 (two) times daily. Noon and bedtime   aspirin EC 81 MG tablet Take 81 mg by mouth daily. Swallow whole.    CALCIUM-VITAMIN D PO Take 1 tablet by mouth daily. 600mg  calcium, 800 units Vit D   divalproex (DEPAKOTE) 125 MG DR tablet Take 125 mg by mouth daily. UNSPECIFIED DEMENTIA, UNSPECIFIED SEVERITY, WITHOUT BEHAVIORAL DISTURBANCE, PSYCHOTIC DISTURBANCE, MOOD DISTURBANCE, AND ANXIETY (F03.90)   ferrous sulfate 325 (65 FE) MG EC tablet Take 325 mg by mouth once. Every Mon, Wed, and Fri   furosemide (LASIX) 20 MG tablet Take 20 mg by mouth every Monday, Wednesday, and Friday.   HYDROcodone-acetaminophen (HYCET) 7.5-325 mg/15 ml solution Take 15 mLs by mouth every 6 (six) hours as needed for moderate pain.   melatonin 3 MG TABS tablet Take 3 mg by mouth at bedtime.   memantine (NAMENDA) 5 MG tablet Take by mouth 2 (two) times daily.   mirtazapine (REMERON) 30 MG tablet TAKE ONE TABLET AT BEDTIME.   Multiple Vitamins-Minerals (SENTRY SENIOR) TABS Take 2 tablets by mouth daily.   polyethylene glycol powder (MIRALAX) 17 GM/SCOOP powder Take 1 Container by mouth once. *MIX WITH 4OZ OF BEVERAGE OF CHOICE*   potassium chloride SA (KLOR-CON M) 20 MEQ tablet Take 20 mEq by mouth every Monday, Wednesday, and Friday.   rivastigmine (EXELON) 1.5 MG capsule Take 1 capsule (1.5 mg total) by mouth 2 (two) times daily.   senna (SENOKOT) 8.6 MG TABS tablet Take 1 tablet (8.6 mg total) by mouth at bedtime.   sertraline (ZOLOFT) 50 MG tablet Take 1 tablet (50 mg total) by mouth daily.   vitamin B-12 (CYANOCOBALAMIN) 500 MCG tablet Take 500 mcg by mouth daily.   lactose free nutrition (BOOST) LIQD Take 237 mLs by mouth 2 (two) times daily.   LORazepam (ATIVAN) 0.5 MG tablet Take 1 tablet (0.5 mg total) by mouth daily as needed (dementia). (Patient not taking: Reported on 03/16/2023)   [DISCONTINUED] pantoprazole (PROTONIX) 40 MG tablet Take 1 tablet (40 mg total) by mouth daily.   No facility-administered encounter medications on file as of 04/14/2023.    Review of Systems  Immunization History  Administered Date(s)  Administered   Influenza, High Dose Seasonal PF 09/23/2017, 09/28/2019   Influenza,inj,Quad PF,6+ Mos 09/16/2018   Influenza-Unspecified 09/14/2017, 09/26/2020, 10/03/2021, 10/06/2022   Moderna SARS-COV2 Booster Vaccination 10/23/2020, 05/14/2021   Moderna Sars-Covid-2 Vaccination 12/19/2019, 01/16/2020, 05/14/2021   Pneumococcal Conjugate-13 02/21/2014   Pneumococcal Polysaccharide-23 02/21/2002   Pneumococcal-Unspecified 12/15/2001   Tdap 12/15/2009, 10/13/2022   Unspecified SARS-COV-2 Vaccination 10/16/2022   Zoster Recombinat (Shingrix) 12/15/2005   Zoster, Unspecified 12/15/2005   Pertinent  Health Maintenance Due  Topic Date Due   INFLUENZA VACCINE  07/16/2023   DEXA SCAN  Completed      09/21/2018    4:01 PM 12/29/2018    4:11 PM 02/08/2019    1:54 PM 03/07/2020   12:54 PM 02/20/2023    2:52 PM  Fall Risk  Falls in the past year? No 0 0 0 1  Was there an injury with Fall?  0 0  1  Fall Risk Category Calculator  0 0  3  Fall Risk Category (Retired)  Low Low    (RETIRED) Patient Fall Risk Level  Low fall risk Low fall risk    Patient at Risk for Falls Due to     History of fall(s)  Fall risk Follow up     Falls evaluation completed   Functional Status Survey:    Vitals:   04/14/23 1527  BP: 136/66  Pulse: 88  Resp: 18  Temp: 97.7 F (36.5 C)  SpO2: 94%  Weight: 150 lb 12.8 oz (68.4 kg)  Height: 5\' 1"  (1.549 m)   Body mass index is 28.49 kg/m. Physical Exam  Labs reviewed: Recent Labs    02/17/23 0352 02/18/23 0746 02/19/23 0937 02/24/23 0000  NA 132* 134* 135 138  K 4.3 3.6 3.5 4.5  CL 103 103 106 107  CO2 24 24 22 21   GLUCOSE 157* 127* 110*  --   BUN 24* 24* 18 17  CREATININE 1.29* 1.19* 0.97 1.0  CALCIUM 7.6* 7.7* 7.7* 8.3*  MG  --   --  2.6*  --    Recent Labs    02/15/23 2058 02/19/23 0937 02/24/23 0000 04/09/23 0000  AST 24 35 57* 21  ALT 21 13 41* 18  ALKPHOS 54 49 75 132*  BILITOT 0.5 0.9  --   --   PROT 7.0 5.7*  --   --    ALBUMIN 3.8 2.9* 3.5 4.1   Recent Labs    09/04/22 0000 02/15/23 2058 02/16/23 0700 02/17/23 0701 02/17/23 1319 02/18/23 0746 02/19/23 0937 02/24/23 0000  WBC 5.1 10.9*   < > 9.9  --  10.1 10.2 11.1  NEUTROABS 2,938.00 7.8*  --   --   --   --   --  8,181.00  HGB 12.2 11.9*   < > 6.7*   < > 7.4* 9.2* 9.7*  HCT 36 35.7*   < > 20.4*   < > 22.7* 27.7* 29*  MCV  --  97.0   < > 100.5*  --  99.6 95.8  --   PLT  --  247   < > 155  --  130* 153 359   < > = values in this interval not displayed.   Lab Results  Component Value Date   TSH 4.58 04/02/2022   No results found for: "HGBA1C" Lab Results  Component Value Date   CHOL 217 (A) 03/29/2021   HDL 48 03/29/2021   LDLCALC 152 03/29/2021   TRIG 70 03/29/2021   CHOLHDL 4.7 08/01/2019    Significant Diagnostic Results in last 30 days:  No results found.  Assessment/Plan Left hip pain  left hip fx, s/p  ORIF IM nail, healed. Alk phos 223 03/12/23>>132 04/09/23, desires Voltaren topical for pain.   Acute postoperative anemia due to expected blood loss Post op anemia, transfused, Hgb 9.7 02/24/23, on Iron.   Grade I diastolic dysfunction Hx of grade I diastolic dysfunction 2018 per echocardiogram due to SOB, 08/18/22 EF 75%, pulmonary hypertension, mild tricuspid/mitral valve insufficiency, on Furosemide.      Family/ staff Communication: plan of care reviewed with the patient and charge nurse.   Labs/tests ordered:  none  Time spend 35 minutes.

## 2023-04-15 DIAGNOSIS — T148XXA Other injury of unspecified body region, initial encounter: Secondary | ICD-10-CM | POA: Insufficient documentation

## 2023-04-15 NOTE — Assessment & Plan Note (Signed)
04/14/23 left heel dark brown/black eschar, firm, leathery, skin prep, pressure reduction.

## 2023-05-14 ENCOUNTER — Ambulatory Visit: Payer: Medicare Other | Admitting: Neurology

## 2023-05-15 DIAGNOSIS — F03A0 Unspecified dementia, mild, without behavioral disturbance, psychotic disturbance, mood disturbance, and anxiety: Secondary | ICD-10-CM | POA: Diagnosis not present

## 2023-05-15 DIAGNOSIS — F330 Major depressive disorder, recurrent, mild: Secondary | ICD-10-CM | POA: Diagnosis not present

## 2023-05-21 ENCOUNTER — Other Ambulatory Visit (INDEPENDENT_AMBULATORY_CARE_PROVIDER_SITE_OTHER): Payer: Medicare PPO

## 2023-05-21 ENCOUNTER — Ambulatory Visit (INDEPENDENT_AMBULATORY_CARE_PROVIDER_SITE_OTHER): Payer: Medicare PPO | Admitting: Orthopedic Surgery

## 2023-05-21 DIAGNOSIS — S72142A Displaced intertrochanteric fracture of left femur, initial encounter for closed fracture: Secondary | ICD-10-CM

## 2023-05-21 NOTE — Progress Notes (Signed)
Orthopedic Surgery Post-operative Office Visit   Procedure: left hip cephalomedullary rodding Date of Surgery: 02/16/2023 (~3 months post-op)   Assessment: Patient is a 89 y.o. who is doing well after surgery.  She is having minimal pain in the hip and is ambulating longer distances without issue     Plan: -Operative plans complete -Okay to submerge wound -Weight bearing as tolerated, encouraged ambulation at her facility -Pain management: Tylenol -Return to office in 3 months, x-rays needed at next visit: AP/lateral left femur   ___________________________________________________________________________     Subjective: Patient is still at her assisted living facility.  She continues to work with PT.  She has been ambulating more with a walker.  She is now able to walk around the entire assisted living facility with a walker.  She has been walking 400 feet when she is willing to.  She has not been complaining of any pain and her daughter has not heard her complain of any pain.  She is only getting Tylenol occasionally for pain.  She has not taken anything else for pain.  There has been no redness or drainage around the incisions.   Objective:   General: no acute distress, appropriate affect Neurologic: alert, answering questions intermittently, following commands Respiratory: unlabored breathing on room air Skin: incisions are well healed with no erythema, induration, drainage   MSK (LLE):               No pain with logroll Plantarflexes and dorsiflexes toes EHL/TA/GSC intact Sensation intact to light touch in sural, saphenous, tibial, deep peroneal, and superficial peroneal nerve distributions Foot warm and well perfused, palpable DP pulse   Imaging: X-rays of the left femur taken 05/21/2023 and independently reviewed today, showing comminuted pertrochanteric femur fracture. Lesser trochanter fracture is displaced medially. Slight valgus alignment.  Callus formation is seen medial  to the nail and just caudal to the screws going into the femoral head.  Discussed formation seen on both AP and lateral views. The lag screws are still centered within the femoral head with no lucency around them. No evidence of screw cut out.      Patient name: Janet [name] Patient MRN: [id number] Date of visit: 05/21/23

## 2023-05-26 ENCOUNTER — Encounter: Payer: Self-pay | Admitting: Nurse Practitioner

## 2023-05-26 ENCOUNTER — Non-Acute Institutional Stay (INDEPENDENT_AMBULATORY_CARE_PROVIDER_SITE_OTHER): Payer: Medicare PPO | Admitting: Nurse Practitioner

## 2023-05-26 DIAGNOSIS — M2042 Other hammer toe(s) (acquired), left foot: Secondary | ICD-10-CM | POA: Diagnosis not present

## 2023-05-26 DIAGNOSIS — M79671 Pain in right foot: Secondary | ICD-10-CM | POA: Diagnosis not present

## 2023-05-26 DIAGNOSIS — Z0000 Encounter for general adult medical examination without abnormal findings: Secondary | ICD-10-CM

## 2023-05-26 DIAGNOSIS — M79672 Pain in left foot: Secondary | ICD-10-CM | POA: Diagnosis not present

## 2023-05-26 DIAGNOSIS — M2041 Other hammer toe(s) (acquired), right foot: Secondary | ICD-10-CM | POA: Diagnosis not present

## 2023-05-26 DIAGNOSIS — L89892 Pressure ulcer of other site, stage 2: Secondary | ICD-10-CM | POA: Diagnosis not present

## 2023-05-26 DIAGNOSIS — L602 Onychogryphosis: Secondary | ICD-10-CM | POA: Diagnosis not present

## 2023-05-27 ENCOUNTER — Ambulatory Visit (INDEPENDENT_AMBULATORY_CARE_PROVIDER_SITE_OTHER): Payer: Medicare PPO | Admitting: Neurology

## 2023-05-27 ENCOUNTER — Encounter: Payer: Self-pay | Admitting: Nurse Practitioner

## 2023-05-27 ENCOUNTER — Encounter: Payer: Self-pay | Admitting: Neurology

## 2023-05-27 VITALS — BP 118/70 | HR 68 | Ht 61.0 in

## 2023-05-27 DIAGNOSIS — Z8781 Personal history of (healed) traumatic fracture: Secondary | ICD-10-CM

## 2023-05-27 DIAGNOSIS — F02C4 Dementia in other diseases classified elsewhere, severe, with anxiety: Secondary | ICD-10-CM | POA: Diagnosis not present

## 2023-05-27 DIAGNOSIS — G301 Alzheimer's disease with late onset: Secondary | ICD-10-CM

## 2023-05-27 DIAGNOSIS — F0390 Unspecified dementia without behavioral disturbance: Secondary | ICD-10-CM | POA: Insufficient documentation

## 2023-05-27 DIAGNOSIS — W19XXXD Unspecified fall, subsequent encounter: Secondary | ICD-10-CM

## 2023-05-27 NOTE — Progress Notes (Signed)
Provider:  Melvyn Novas, MD  Primary Care Physician:  Mast, Man X, NP [address] [place] [address]     Referring Provider: Mahlon Gammon, Md [address] [place],  [place] [address]          Chief Complaint according to patient   Patient presents with:     New Patient (Initial Visit)           HISTORY OF PRESENT ILLNESS:  05-27-2023 VALEDA CORZINE is a 89 y.o. female patient who is here for revisit 05/27/2023 for  a follow up on memory.  Chief concern according to patient and daughter, the patient broke her femur in March of this year and it has left her significantly impaired in terms of ambulation. Her femur is healing and she is pain free. She  presents in a wheelchair, but needs now a walker, 400-500 ft can be managed. ! Her motivation has been good but her daughter is concerned about cognitive decline.      Update 11/13/2022 JM: Patient returns for Alzheimer's dementia follow-up accompanied by her daughter.  She was previously seen by Dr. Anne Hahn for many years . She continues to reside at [facility].  Patient believes memory has been stable since prior visit but daughter reports it has declined, has good days and bad days.  She has remained on Namenda and low-dose Exelon.  Namenda dosage decreased last month currently taking 5 mg twice daily due to concern of bilateral leg swelling, no significant change in swelling since dosage change but also has not noticed any significant changes to her memory.  Does have chronic gait disorder and prior diagnosis of dropped head syndrome which has been generally stable since prior visit.  Use of cane at all times.  Does ambulate 3 times daily to dining area, denies any recent falls.      TERIANN LIVINGOOD is a 89 y.o. female who used to see Dr Anne Hahn and was transferred for memory care by PCP on 11-19-2021.     11-19-2021:  Ms. Tenorio is an 89 year old left-handed white female with a history of a progressive memory  disturbance.  The patient is on Namenda and low-dose Exelon, she cannot tolerate doses greater than 1.5 mg twice daily of the Exelon.  The patient resided at [facility] and moved to assisted living in 2021.,She is socially active, she is now involved in tai chi classes, she seems to enjoy her life there.  The patient has not had any falls, she uses a cane consistently when she is outside of her room.  The patient is eating well, she has developed some swelling in the legs that seems to go down overnight.  The patient previously has had some problems with low sodium levels. Her diet is low salt, not by choice , but because that's what is served at her senior residence.  She has a decreased creatinine clearance , CKD 2.    MMSE - Mini Mental State Exam 11/19/2021 05/09/2021 10/25/2020 04/27/2020 08/10/2019 01/31/2019 05/21/2018  Orientation to time 3 0 1 1 4 3 4   Orientation to Place 5 2 3 4 5 4 5   Registration 3 3 3 3 3 3 3   Attention/ Calculation 0 1 1 1 5 5 5   Recall 0 0 0 0 0 0 1  Language- name 2 objects 2 2 2 2 2 2 2   Language- repeat 1 0 1 1 1  1 1  Language- follow 3 step command 2 3 3 3 3 3 2   Language- read & follow direction 1 1 1 1 1 1 1   Write a sentence 1 1 1 1 1 1 1   Copy design 1 1 1 1 1 1 1   Total score 19 14 17 18 26 24 26        05/27/2023    2:36 PM 11/13/2022    3:08 PM 11/19/2021    2:13 PM 05/09/2021    3:26 PM 10/25/2020    3:15 PM 04/27/2020   10:00 AM 08/10/2019   11:41 AM  MMSE - Mini Mental State Exam  Orientation to time 0 1 3 0 1 1 4   Orientation to Place 1 2 5 2 3 4 5   Registration 3 3 3 3 3 3 3   Attention/ Calculation 0 1 0 1 1 1 5   Recall 3 0 0 0 0 0 0  Language- name 2 objects 2 2 2 2 2 2 2   Language- repeat 1 1 1  0 1 1 1   Language- follow 3 step command 0 3 2 3 3 3 3   Language- read & follow direction 1 1 1 1 1 1 1   Write a sentence 1 1 1 1 1 1 1   Copy design 0 1 1 1 1 1 1   Total score 12 16 19 14 17 18 26                Review of Systems: Out of a  complete 14 system review, the patient complains of only the following symptoms, and all other reviewed systems are negative.:    Moderate severe dementia impairment , MMSE of 12/ 30 points.  Trauma Has been mildly impaired since 4 years ago now much progressed.   Speech therapist : Global deterioration score of 5.4, assistance with ADL needed.   Patient speech therapist is Anica Macginnis M. Of Science, she is a Facilities manager and Solicitor and brought to my attention that the following is the most important thing : she can work in speech therapy on the increased recall of safety information this is the location of the call button, strategies that physical therapy has told and talked the patient about safety and utilizing certain strategies.  Sometimes the strategies may not work however it is worth to attempt before we make any other decision.  She has demonstrated some frustration during for speech therapy when completing memory tasks but she is enjoying socialization and activities.   Depakote was added for behavior , anxiety- and it has worked.    to her medication she is allergic to donepezil, tramadol and Lasix.   She is now on rivastigmine patches instead of taking an oral program as before.  Social History   Socioeconomic History   Marital status: Widowed    Spouse name: Not on file   Number of children: Not on file   Years of education: 16   Highest education level: Not on file  Occupational History   Occupation: Retired Acupuncturist  Tobacco Use   Smoking status: Never   Smokeless tobacco: Never  Vaping Use   Vaping Use: Never used  Substance and Sexual Activity   Alcohol use: No   Drug use: No   Sexual activity: Not on file  Other Topics Concern   Not on file  Social History Narrative   Lives at [facility]   Left-handed    Caffeine: tea once a  day   Social Determinants of Health   Financial Resource Strain: Not on file   Food Insecurity: No Food Insecurity (02/15/2023)   Hunger Vital Sign    Worried About Running Out of Food in the Last Year: Never true    Ran Out of Food in the Last Year: Never true  Transportation Needs: No Transportation Needs (02/15/2023)   PRAPARE - Administrator, Civil Service (Medical): No    Lack of Transportation (Non-Medical): No  Physical Activity: Not on file  Stress: Not on file  Social Connections: Not on file    Family History  Problem Relation Age of Onset   Depression Mother    Diabetes Father    Heart attack Father    Dementia Sister    Pneumonia Brother    Breast cancer Daughter     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   Dyslipidemia    Fibromyalgia    Granuloma annulare    History of uterine cancer 2003   treated with hysterectomy   Lumbar radicular syndrome    left L5   Macular degeneration    left eye   Sciatica    TIA (transient ischemic attack)     Past Surgical History:  Procedure Laterality Date   ABDOMINAL HYSTERECTOMY     BREAST BIOPSY  2007   CATARACT EXTRACTION     INTRAMEDULLARY (IM) NAIL INTERTROCHANTERIC Left 02/16/2023   Procedure: INTRAMEDULLARY (IM) NAIL INTERTROCHANTERIC;  Surgeon: London Sheer, MD;  Location: [facility];  Service: Orthopedics;  Laterality: Left;   TONSILLECTOMY       Current Outpatient Medications on File Prior to Visit  Medication Sig Dispense Refill   acetaminophen (TYLENOL) 325 MG tablet Take 650 mg by mouth 2 (two) times daily. Noon and bedtime     aspirin EC 81 MG tablet Take 81 mg by mouth daily. Swallow whole.     CALCIUM-VITAMIN D PO Take 1 tablet by mouth daily. 600mg  calcium, 800 units Vit D     divalproex (DEPAKOTE) 125 MG DR tablet Take 125 mg by mouth daily. UNSPECIFIED DEMENTIA,  UNSPECIFIED SEVERITY, WITHOUT BEHAVIORAL DISTURBANCE, PSYCHOTIC DISTURBANCE, MOOD DISTURBANCE, AND ANXIETY (F03.90)     ferrous sulfate 325 (65 FE) MG EC tablet Take 325 mg by mouth once. Every Mon, Wed, and Fri     furosemide (LASIX) 20 MG tablet Take 20 mg by mouth every Monday, Wednesday, and Friday.     HYDROcodone-acetaminophen (HYCET) 7.5-325 mg/15 ml solution Take 15 mLs by mouth every 6 (six) hours as needed for moderate pain.     lactose free nutrition (BOOST) LIQD Take 237 mLs by mouth 2 (two) times daily.     melatonin 3 MG TABS tablet Take 3 mg by mouth at bedtime.     memantine (NAMENDA) 5 MG tablet Take by mouth 2 (two) times daily.     mirtazapine (REMERON) 30 MG tablet TAKE ONE TABLET AT BEDTIME. 90 tablet 0   Multiple Vitamins-Minerals (SENTRY SENIOR) TABS Take 2 tablets by mouth daily.     polyethylene glycol powder (MIRALAX) 17 GM/SCOOP powder Take 1 Container by mouth once. *MIX WITH 4OZ OF BEVERAGE OF CHOICE*     potassium chloride SA (KLOR-CON M) 20  MEQ tablet Take 20 mEq by mouth every Monday, Wednesday, and Friday.     rivastigmine (EXELON) 4.6 mg/24hr Place 4.6 mg onto the skin daily.     senna (SENOKOT) 8.6 MG TABS tablet Take 1 tablet (8.6 mg total) by mouth at bedtime. 28 tablet 0   sertraline (ZOLOFT) 50 MG tablet Take 1 tablet (50 mg total) by mouth daily. 30 tablet 3   vitamin B-12 (CYANOCOBALAMIN) 500 MCG tablet Take 500 mcg by mouth daily.     No current facility-administered medications on file prior to visit.    Allergies  Allergen Reactions   Aricept [Donepezil Hcl]     Muscle cramps   Latex Itching   Tramadol Other (See Comments)    Pt can't remember what side effects she had, she doesn't take it now     DIAGNOSTIC DATA (LABS, IMAGING, TESTING) - I reviewed patient records, labs, notes, testing and imaging myself where available.  Lab Results  Component Value Date   WBC 11.1 02/24/2023   HGB 9.7 (A) 02/24/2023   HCT 29 (A) 02/24/2023   MCV 95.8  02/19/2023   PLT 359 02/24/2023      Component Value Date/Time   NA 138 02/24/2023 0000   K 4.5 02/24/2023 0000   CL 107 02/24/2023 0000   CO2 21 02/24/2023 0000   GLUCOSE 110 (H) 02/19/2023 0937   BUN 17 02/24/2023 0000   CREATININE 1.0 02/24/2023 0000   CREATININE 0.97 02/19/2023 0937   CREATININE 1.15 (H) 03/02/2020 0829   CALCIUM 8.3 (A) 02/24/2023 0000   PROT 5.7 (L) 02/19/2023 0937   PROT 6.7 11/16/2014 0939   ALBUMIN 4.1 04/09/2023 0000   ALBUMIN 4.4 11/16/2014 0939   AST 21 04/09/2023 0000   ALT 18 04/09/2023 0000   ALKPHOS 132 (A) 04/09/2023 0000   BILITOT 0.9 02/19/2023 0937   GFRNONAA 56 (L) 02/19/2023 0937   GFRNONAA 43 (L) 03/02/2020 0829   GFRAA 50 (L) 03/02/2020 0829   Lab Results  Component Value Date   CHOL 217 (A) 03/29/2021   HDL 48 03/29/2021   LDLCALC 152 03/29/2021   TRIG 70 03/29/2021   CHOLHDL 4.7 08/01/2019   No results found for: "HGBA1C" Lab Results  Component Value Date   VITAMINB12 672 02/17/2023   Lab Results  Component Value Date   TSH 4.58 04/02/2022    PHYSICAL EXAM:  Today's Vitals   05/27/23 1424  BP: 118/70  Pulse: 68  Height: 5\' 1"  (1.549 m)   Body mass index is 28.53 kg/m.   Wt Readings from Last 3 Encounters:  05/26/23 151 lb (68.5 kg)  04/14/23 150 lb 12.8 oz (68.4 kg)  03/16/23 146 lb 14.4 oz (66.6 kg)     Ht Readings from Last 3 Encounters:  05/27/23 5\' 1"  (1.549 m)  05/26/23 5\' 1"  (1.549 m)  04/14/23 5\' 1"  (1.549 m)      General: The patient is awake, alert and appears not in acute distress. The patient is groomed. Head: Normocephalic, atraumatic. Neck is supple. Cardiovascular:  Regular rate and cardiac rhythm by pulse,  without distended neck veins. Respiratory: Lungs are clear to auscultation.  Skin:  Without evidence of ankle edema, or rash. Trunk: The patient's posture is erect.   NEUROLOGIC EXAM: The patient is awake and alert, oriented to place and time.   Memory subjective described as  intact.  Attention span & concentration ability appears normal.  Speech is fluent,  without  dysarthria, dysphonia or aphasia.  Mood and affect are appropriate.   Pupils are equal and briskly reactive to light. Extraocular movements  in vertical and horizontal planes intact and without nystagmus.  Visual fields by finger perimetry are intact.macular degeneration.  Hearing to finger rub intact.   Facial sensation intact to fine touch.  Facial motor strength is symmetric and tongue and uvula move midline.  Tongue protrusion into either cheek is normal. Shoulder shrug is normal.    Motor exam:   elevated  tone , reduced muscle bulk and symmetric  strength in all extremities. Grip strength weakness.  Bilateral atrophy of the thenar eminence red palms, some tremor.    Sensory:  Fine touch, pinprick and vibration were tested in all extremities.  Proprioception was intact      Motor exam:  deferred.    ASSESSMENT AND PLAN 89 y.o. year old female  here with:   Fast progression of dementia after fall and fracture, mobility and ADL impaired:    Patient speech therapist is Anica Macginnis M. Of Science, she is a Facilities manager and Solicitor and brought to my attention that the following is the most important thing : she can work in speech therapy on the increased recall of safety information this is the location of the call button, strategies that physical therapy has told and talked the patient about safety and utilizing certain strategies.  Sometimes the strategies may not work however it is worth to attempt before we make any other decision.  She has demonstrated some frustration during for speech therapy when completing memory tasks but she is enjoying socialization and activities.   Depakote was added for behavior , anxiety- and it has worked.     1) I agree with Depakote 125 mg daily as it has helped with her anxiety and mood.   2) Alzheimer's late onset now moderate severe.   3)  continue speech and PT therapy . Rv in 6-8 months with NP.      I plan to follow up either personally or through our NP within 6-8 months.   I would like to thank Mast, Man X, NP and Mahlon Gammon, Md [address] [place],  [place] [address] for allowing me to meet with and to take care of this pleasant patient.   CC: I will share my notes with .  After spending a total time of 35  minutes face to face and additional time for physical and neurologic examination, review of laboratory studies,  personal review of imaging studies, reports and results of other testing and review of referral information / records as far as provided in visit,   Electronically signed by: Melvyn Novas, MD 05/27/2023 3:02 PM  [facility] Board certified by The [employer] of Sleep Medicine and Diplomate of the [employer] of Sleep Medicine. Board certified In Neurology through the ABPN, Fellow of the [employer] of Neu[employer].

## 2023-05-27 NOTE — Patient Instructions (Signed)
Caregiver Guide Alzheimer's disease is a condition that makes a person: Forget things. Act differently. Have trouble paying attention and doing simple tasks. These things get worse with time. The tips below can help you care for the person. How to help manage lifestyle changes Tips to help with symptoms Be calm and patient. Give simple, short answers to questions. Avoid correcting the person in a negative way. Try not to take things personally, even if the person forgets your name. Do not argue with the person. This may make the person more upset. Tips to lessen frustration Make appointments and do daily tasks when the person is at his or her best. Take your time. Simple tasks may take longer. Allow plenty of time to complete tasks. Limit choices for the person. Involve the person in what you are doing. Keep things organized: Keep a daily routine. Organize medicines in a pillbox for each day of the week. Keep a calendar in a central location to remind the person of meetings or other activities. Avoid new or crowded places, if possible. Use simple words, short sentences, and a calm voice. Only give one direction at a time. Buy clothes and shoes that are easy to put on and take off. Try to change the subject if the person becomes frustrated or angry. Tips to prevent injury  Keep floors clear. Remove rugs, magazine racks, and floor lamps. Keep hallways well-lit. Put a handrail and non-slip mat in the bathtub or shower. Put childproof locks on cabinets that have dangerous items in them. These items include medicine, alcohol, guns, toxic cleaning items, sharp tools, matches, and lighters. Put locks on doors where the person cannot see or reach them. This helps keep the person from going out of the house and getting lost. Be ready for emergencies. Keep a list of emergency phone numbers and addresses close by. Remove car keys and lock garage doors so that the person does not try to  drive. Bracelets may be worn that track location and identify the person as having memory problems. This should be worn at all times for safety. Tips for the future  Discuss financial and legal planning early. People with this disease have trouble managing their money as the disease gets worse. Get help from a professional. Talk about advance directives, safety, and daily care. Take these steps: Create a living will and choose a power of attorney. This is someone who can make decisions for the person with Alzheimer's disease when he or she can no longer do so. Discuss driving safety and when to stop driving. The person's doctor can help with this. If the person lives alone, make sure he or she is safe. Some people need extra help at home. Other people need more care at a nursing home or care center. How to recognize changes in the person's condition With this disease, memory problems and confusion slowly get worse. In time, the person may not know his or her friends and family members. The disease can also cause changes in behavior and mood, such as anxiety or anger. The person may see, hear, taste, smell, or feel things that are not real (hallucinate). These changes can come on all of a sudden. They may happen in response to something such as: Pain. An infection. Changes in temperature or noise. Too much stimulation. Feeling lost or scared. Medicines. Where to find support Find out about services that can provide short-term care (respite care). These can allow you to take a break when you need  it. Join a support group near you. These groups can help you: Learn ways to manage stress. Share experiences with others. Get emotional comfort and support. Learn about caregiving as the disease gets worse. Know what community resources are available. Where to find more information Alzheimer's Association: LimitLaws.hu Contact a doctor if: The person has a fever. The person has a sudden behavior  change that does not get better with calming strategies. The person is not able to take care of himself or herself at home. You are no longer able to care for the person. Get help right away if: The person has a sudden increase in confusion or new hallucinations. The person threatens you or anyone else, including himself or herself. Get help right away if you feel like your loved one may hurt himself or herself or others, or has thoughts about taking his or her own life. Go to your nearest emergency room or: Call your local emergency services (911 in the [place] Call the National Suicide Prevention Lifeline at 432-258-1649 or 988 in the [place] This is open 24 hours a day. Text the Crisis Text Line at 8435998686. Summary Alzheimer's disease causes a person to forget things. A person who has this condition may have trouble doing simple tasks. Take steps to keep the person from getting hurt. Plan for future care. You can find support by joining a support group near you. This information is not intended to replace advice given to you by your health care provider. Make sure you discuss any questions you have with your health care provider. Document Revised: 06/26/2021 Document Reviewed: 03/19/2020 Elsevier Patient Education  2024 [employer].

## 2023-05-27 NOTE — Progress Notes (Signed)
Subjective:   Janet [name] is a 89 y.o. female who presents for Medicare Annual (Subsequent) preventive examination [facility] Cardiac Risk Factors include: advanced age (>37men, >73 women);hypertension;dyslipidemia     Objective:    Today's Vitals   05/26/23 0932  BP: (!) 118/58  Pulse: 80  Resp: 18  Temp: (!) 97.4 F (36.3 C)  SpO2: 98%  Weight: 151 lb (68.5 kg)  Height: 5\' 1"  (1.549 m)   Body mass index is 28.53 kg/m.     05/26/2023    9:33 AM 04/14/2023    3:44 PM 03/16/2023    3:10 PM 03/04/2023    3:37 PM 02/20/2023    2:56 PM 02/16/2023    3:41 PM 02/15/2023   11:16 PM  Advanced Directives  Does Patient Have a Medical Advance Directive? Yes Yes Yes Yes Yes Yes Yes  Type of Estate agent of [place];Living will;Out of facility DNR (pink MOST or yellow form) Healthcare Power of [place];Living will;Out of facility DNR (pink MOST or yellow form) Healthcare Power of [place];Living will;Out of facility DNR (pink MOST or yellow form) Healthcare Power of [place];Living will;Out of facility DNR (pink MOST or yellow form) Healthcare Power of [place];Living will;Out of facility DNR (pink MOST or yellow form) Healthcare Power of [place];Living will Healthcare Power of [place];Living will  Does patient want to make changes to medical advance directive? No - Patient declined No - Patient declined No - Patient declined No - Patient declined No - Patient declined No - Guardian declined No - Patient declined  Copy of Healthcare Power of Attorney in Chart? Yes - validated most recent copy scanned in chart (See row information) Yes - validated most recent copy scanned in chart (See row information) Yes - validated most recent copy scanned in chart (See row information) Yes - validated most recent copy scanned in chart (See row information) Yes - validated most recent copy scanned in chart (See row information)    Pre-existing out of facility DNR order (yellow form or pink MOST  form) Pink MOST/Yellow Form most recent copy in chart - Physician notified to receive inpatient order Pink MOST/Yellow Form most recent copy in chart - Physician notified to receive inpatient order Yellow form placed in chart (order not valid for inpatient use);Pink MOST form placed in chart (order not valid for inpatient use) Yellow form placed in chart (order not valid for inpatient use);Pink MOST form placed in chart (order not valid for inpatient use) Yellow form placed in chart (order not valid for inpatient use);Pink MOST form placed in chart (order not valid for inpatient use)      Current Medications (verified) Outpatient Encounter Medications as of 05/26/2023  Medication Sig   acetaminophen (TYLENOL) 325 MG tablet Take 650 mg by mouth 2 (two) times daily. Noon and bedtime   aspirin EC 81 MG tablet Take 81 mg by mouth daily. Swallow whole.   CALCIUM-VITAMIN D PO Take 1 tablet by mouth daily. 600mg  calcium, 800 units Vit D   divalproex (DEPAKOTE) 125 MG DR tablet Take 125 mg by mouth daily. UNSPECIFIED DEMENTIA, UNSPECIFIED SEVERITY, WITHOUT BEHAVIORAL DISTURBANCE, PSYCHOTIC DISTURBANCE, MOOD DISTURBANCE, AND ANXIETY (F03.90)   ferrous sulfate 325 (65 FE) MG EC tablet Take 325 mg by mouth once. Every Mon, Wed, and Fri   furosemide (LASIX) 20 MG tablet Take 20 mg by mouth every Monday, Wednesday, and Friday.   HYDROcodone-acetaminophen (HYCET) 7.5-325 mg/15 ml solution Take 15 mLs by mouth every 6 (six) hours as  needed for moderate pain.   lactose free nutrition (BOOST) LIQD Take 237 mLs by mouth 2 (two) times daily.   melatonin 3 MG TABS tablet Take 3 mg by mouth at bedtime.   memantine (NAMENDA) 5 MG tablet Take by mouth 2 (two) times daily.   mirtazapine (REMERON) 30 MG tablet TAKE ONE TABLET AT BEDTIME.   Multiple Vitamins-Minerals (SENTRY SENIOR) TABS Take 2 tablets by mouth daily.   polyethylene glycol powder (MIRALAX) 17 GM/SCOOP powder Take 1 Container by mouth once. *MIX WITH 4OZ OF  BEVERAGE OF CHOICE*   potassium chloride SA (KLOR-CON M) 20 MEQ tablet Take 20 mEq by mouth every Monday, Wednesday, and Friday.   rivastigmine (EXELON) 1.5 MG capsule Take 1 capsule (1.5 mg total) by mouth 2 (two) times daily.   senna (SENOKOT) 8.6 MG TABS tablet Take 1 tablet (8.6 mg total) by mouth at bedtime.   sertraline (ZOLOFT) 50 MG tablet Take 1 tablet (50 mg total) by mouth daily.   vitamin B-12 (CYANOCOBALAMIN) 500 MCG tablet Take 500 mcg by mouth daily.   [DISCONTINUED] LORazepam (ATIVAN) 0.5 MG tablet Take 1 tablet (0.5 mg total) by mouth daily as needed (dementia). (Patient not taking: Reported on 03/16/2023)   No facility-administered encounter medications on file as of 05/26/2023.    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   Dyslipidemia    Fibromyalgia    Granuloma annulare    History of uterine cancer 2003   treated with hysterectomy   Lumbar radicular syndrome    left L5   Macular degeneration    left eye   Sciatica    TIA (transient ischemic attack)    Past Surgical History:  Procedure Laterality Date   ABDOMINAL HYSTERECTOMY     BREAST BIOPSY  2007   CATARACT EXTRACTION     INTRAMEDULLARY (IM) NAIL INTERTROCHANTERIC Left 02/16/2023   Procedure: INTRAMEDULLARY (IM) NAIL INTERTROCHANTERIC;  Surgeon: London Sheer, MD;  Location: [facility];  Service: Orthopedics;  Laterality: Left;   TONSILLECTOMY     Family History  Problem Relation Age of Onset   Depression Mother    Diabetes Father    Heart attack Father    Dementia Sister    Pneumonia Brother    Breast cancer Daughter    Social History   Socioeconomic History   Marital status: Widowed    Spouse name: Not on file    Number of children: Not on file   Years of education: 16   Highest education level: Not on file  Occupational History   Occupation: Retired Acupuncturist  Tobacco Use   Smoking status: Never   Smokeless tobacco: Never  Vaping Use   Vaping Use: Never used  Substance and Sexual Activity   Alcohol use: No   Drug use: No   Sexual activity: Not on file  Other Topics Concern   Not on file  Social History Narrative   L[facility] at [facility]   Left-handed    Caffeine: tea once a day   Social Determinants of Health   Financial Resource Strain: Not on file  Food Insecurity: No Food Insecurity (02/15/2023)   Hunger Vital  Sign    Worried About Programme researcher, broadcasting/film/video in the Last Year: Never true    Ran Out of Food in the Last Year: Never true  Transportation Needs: No Transportation Needs (02/15/2023)   PRAPARE - Administrator, Civil Service (Medical): No    Lack of Transportation (Non-Medical): No  Physical Activity: Not on file  Stress: Not on file  Social Connections: Not on file    Tobacco Counseling Counseling given: Not Answered   Clinical Intake:  Pre-visit preparation completed: Yes  Pain : No/denies pain     BMI - recorded: 28.53 Nutritional Status: BMI 25 -29 Overweight Nutritional Risks: None Diabetes: No  How often do you need to have someone help you when you read instructions, pamphlets, or other written materials from your doctor or pharmacy?: 4 - Often What is the last grade level you completed in school?: [place]  Diabetic?no  Interpreter Needed?: No  Information entered by :: Tajia Szeliga Nedra Hai NP   Activities of Daily Living    05/27/2023    1:02 PM 02/15/2023   11:16 PM  In your present state of health, do you have any difficulty performing the following activities:  Hearing? 0 0  Vision? 0 0  Difficulty concentrating or making decisions? 1 1  Walking or climbing stairs? 1 1  Dressing or bathing? 1 0  Doing  errands, shopping? 1 1  Preparing Food and eating ? N   Using the Toilet? Y   In the past six months, have you accidently leaked urine? Y   Do you have problems with loss of bowel control? N   Managing your Medications? Y   Managing your Finances? Y   Housekeeping or managing your Housekeeping? Y     Patient Care Team: Henderson Frampton X, NP as PCP - General (Internal Medicine) Romero Belling, MD as Referring Physician (Physical Medicine and Rehabilitation) Merlene Laughter, MD (Inactive) as Consulting Physician (Internal Medicine)  Indicate any recent Medical Services you may have received from other than [facility] providers in the past year (date may be approximate).     Assessment:   This is a routine wellness examination for [place].  Hearing/Vision screen No results found.  Dietary issues and exercise activities discussed: Current Exercise Habits: The patient does not participate in regular exercise at present, Exercise limited by: cardiac condition(s);orthopedic condition(s)   Goals Addressed             This Visit's Progress    mobility       Evidence-based guidance:  Emphasize the importance of physical activity and aerobic exercise as included in treatment plan; assess barriers to adherence; consider patient's abilities and preferences.  Encourage gradual increase in activity or exercise instead of stopping if pain occurs.  Reinforce individual therapy exercise prescription, such as strengthening, stabilization and stretching programs.  Promote optimal body mechanics to stabilize the spine with lifting and functional activity.  Encourage activity and mobility modifications to facilitate optimal function, such as using a log roll for bed mobility or dressing from a seated position.  Reinforce individual adaptive equipment recommendations to limit excessive spinal movements, such as a Event organiser.  Assess adequacy of sleep; encourage use of sleep hygiene techniques, such as  bedtime routine; use of white noise; dark, cool bedroom; avoiding daytime naps, heavy meals or exercise before bedtime.  Promote positions and modification to optimize sleep and sexual activity; consider pillows or positioning devices to assist in maintaining neutral spine.  Explore options  for applying ergonomic principles at work and home, such as frequent position changes, using ergonomically designed equipment and working at optimal height.  Promote modifications to increase comfort with driving such as lumbar support, optimizing seat and steering wheel position, using cruise control and taking frequent rest stops to stretch and walk.   Notes:        Depression Screen    05/26/2023    9:34 AM 02/20/2023    2:52 PM 04/21/2018   12:05 PM 04/21/2018   11:45 AM 02/15/2018    4:05 PM  PHQ 2/9 Scores  PHQ - 2 Score 0 0 1 1 1   PHQ- 9 Score 0  4    Exception Documentation Other- indicate reason in comment box      Not completed AWV        Fall Risk    05/26/2023    9:34 AM 02/20/2023    2:52 PM 03/07/2020   12:54 PM 02/08/2019    1:54 PM 12/29/2018    4:11 PM  Fall Risk   Falls in the past year? 0 1 0 0 0  Number falls in past yr: 0 1 0 0 0  Injury with Fall? 0 1  0 0  Risk for fall due to : Impaired balance/gait History of fall(s)     Follow up Falls evaluation completed Falls evaluation completed       FALL RISK PREVENTION PERTAINING TO THE HOME:  Any stairs in or around the home? Yes  If so, are there any without handrails? No  Home free of loose throw rugs in walkways, pet beds, electrical cords, etc? Yes  Adequate lighting in your home to reduce risk of falls? Yes   ASSISTIVE DEVICES UTILIZED TO PREVENT FALLS:  Life alert? No  Use of a cane, walker or w/c? Yes  Grab bars in the bathroom? Yes  Shower chair or bench in shower? Yes  Elevated toilet seat or a handicapped toilet? Yes   TIMED UP AND GO:  Was the test performed? No . Needs assistance with transfer    Cognitive  Function:    11/13/2022    3:08 PM 11/19/2021    2:13 PM 05/09/2021    3:26 PM 10/25/2020    3:15 PM 04/27/2020   10:00 AM  MMSE - Mini Mental State Exam  Orientation to time 1 3 0 1 1  Orientation to Place 2 5 2 3 4   Registration 3 3 3 3 3   Attention/ Calculation 1 0 1 1 1   Recall 0 0 0 0 0  Language- name 2 objects 2 2 2 2 2   Language- repeat 1 1 0 1 1  Language- follow 3 step command 3 2 3 3 3   Language- read & follow direction 1 1 1 1 1   Write a sentence 1 1 1 1 1   Copy design 1 1 1 1 1   Total score 16 19 14 17 18         Immunizations Immunization History  Administered Date(s) Administered   Influenza, High Dose Seasonal PF 09/23/2017, 09/28/2019   Influenza,inj,Quad PF,6+ Mos 09/16/2018   Influenza-Unspecified 09/14/2017, 09/26/2020, 10/03/2021, 10/06/2022   Moderna SARS-COV2 Booster Vaccination 10/23/2020, 05/14/2021   Moderna Sars-Covid-2 Vaccination 12/19/2019, 01/16/2020, 05/14/2021   Pneumococcal Conjugate-13 02/21/2014   Pneumococcal Polysaccharide-23 02/21/2002   Pneumococcal-Unspecified 12/15/2001   Tdap 12/15/2009, 10/13/2022   Unspecified SARS-COV-2 Vaccination 10/16/2022   Zoster Recombinat (Shingrix) 12/15/2005   Zoster, Unspecified 12/15/2005    TDAP status: Up to  date  Flu Vaccine status: Up to date  Pneumococcal vaccine status: Up to date  Covid-19 vaccine status: Information provided on how to obtain vaccines.   Qualifies for Shingles Vaccine? Yes   Zostavax completed Yes   Shingrix Completed?: Yes  Screening Tests Health Maintenance  Topic Date Due   COVID-19 Vaccine (7 - 2023-24 season) 12/11/2022   Zoster Vaccines- Shingrix (2 of 2) 06/15/2023 (Originally 02/09/2006)   INFLUENZA VACCINE  07/16/2023   Medicare Annual Wellness (AWV)  05/25/2024   DTaP/Tdap/Td (3 - Td or Tdap) 10/13/2032   Pneumonia Vaccine 45+ Years old  Completed   DEXA SCAN  Completed   HPV VACCINES  Aged Out    Health Maintenance  Health Maintenance Due  Topic  Date Due   COVID-19 Vaccine (7 - 2023-24 season) 12/11/2022    Colorectal cancer screening: No longer required.   Mammogram status: No longer required due to aged out.  Bone Density status: Completed 12/31/22. Results reflect: Bone density results: OSTEOPENIA. Repeat every 2 years.  Lung Cancer Screening: (Low Dose CT Chest recommended if Age 87-80 years, 30 pack-year currently smoking OR have quit w/in 15years.) does not qualify.     Additional Screening:  Hepatitis C Screening: does not qualify;   Vision Screening: Recommended annual ophthalmology exams for early detection of glaucoma and other disorders of the eye. Is the patient up to date with their annual eye exam?  No  Who is the provider or what is the name of the office in which the patient attends annual eye exams? [facility] will provide If pt is not established with a provider, would they like to be referred to a provider to establish care? No .   Dental Screening: Recommended annual dental exams for proper oral hygiene  Community Resource Referral / Chronic Care Management: CRR required this visit?  No   CCM required this visit?  No      Plan:     I have personally reviewed and noted the following in the patient's chart:   Medical and social history Use of alcohol, tobacco or illicit drugs  Current medications and supplements including opioid prescriptions. Patient is not currently taking opioid prescriptions. Functional ability and status Nutritional status Physical activity Advanced directives List of other physicians Hospitalizations, surgeries, and ER visits in previous 12 months Vitals Screenings to include cognitive, depression, and falls Referrals and appointments  In addition, I have reviewed and discussed with patient certain preventive protocols, quality metrics, and best practice recommendations. A written personalized care plan for preventive services as well as general preventive health  recommendations were provided to patient.     Madelyn Tlatelpa X Annora Guderian, NP   05/27/2023

## 2023-06-02 DIAGNOSIS — D509 Iron deficiency anemia, unspecified: Secondary | ICD-10-CM | POA: Diagnosis not present

## 2023-06-02 LAB — CBC AND DIFFERENTIAL
HCT: 35 — AB (ref 36–46)
Hemoglobin: 11.8 — AB (ref 12.0–16.0)
Neutrophils Absolute: 4313
Platelets: 282 10*3/uL (ref 150–400)
WBC: 6.8

## 2023-06-02 LAB — CBC: RBC: 3.71 — AB (ref 3.87–5.11)

## 2023-06-05 ENCOUNTER — Non-Acute Institutional Stay (SKILLED_NURSING_FACILITY): Payer: Medicare PPO | Admitting: Nurse Practitioner

## 2023-06-05 ENCOUNTER — Encounter: Payer: Self-pay | Admitting: Nurse Practitioner

## 2023-06-05 DIAGNOSIS — L089 Local infection of the skin and subcutaneous tissue, unspecified: Secondary | ICD-10-CM | POA: Insufficient documentation

## 2023-06-05 DIAGNOSIS — F339 Major depressive disorder, recurrent, unspecified: Secondary | ICD-10-CM

## 2023-06-05 DIAGNOSIS — E782 Mixed hyperlipidemia: Secondary | ICD-10-CM

## 2023-06-05 DIAGNOSIS — F0390 Unspecified dementia without behavioral disturbance: Secondary | ICD-10-CM | POA: Diagnosis not present

## 2023-06-05 DIAGNOSIS — N1831 Chronic kidney disease, stage 3a: Secondary | ICD-10-CM | POA: Diagnosis not present

## 2023-06-05 DIAGNOSIS — T148XXA Other injury of unspecified body region, initial encounter: Secondary | ICD-10-CM | POA: Diagnosis not present

## 2023-06-05 DIAGNOSIS — E538 Deficiency of other specified B group vitamins: Secondary | ICD-10-CM | POA: Diagnosis not present

## 2023-06-05 DIAGNOSIS — D62 Acute posthemorrhagic anemia: Secondary | ICD-10-CM

## 2023-06-05 DIAGNOSIS — I5189 Other ill-defined heart diseases: Secondary | ICD-10-CM | POA: Diagnosis not present

## 2023-06-05 NOTE — Progress Notes (Signed)
Location:  [facility] Room Number: NO/26/A Place of Service:  SNF (31) Provider:  Edword Cu X, NP  Patient Care Team: Miana Politte X, NP as PCP - General (Internal Medicine) Romero Belling, MD as Referring Physician (Physical Medicine and Rehabilitation) Merlene Laughter, MD (Inactive) as Consulting Physician (Internal Medicine)  Extended Emergency Contact Information Primary Emergency Contact: Soyla Dryer, [place] Home Phone: [phone] Relation: Daughter Secondary Emergency Contact: Myers,Lois  [place] Home Phone: [phone] Relation: Daughter  Code Status:  DNR Goals of care: Advanced Directive information    05/26/2023    9:33 AM  Advanced Directives  Does Patient Have a Medical Advance Directive? Yes  Type of Estate agent of [place];Living will;Out of facility DNR (pink MOST or yellow form)  Does patient want to make changes to medical advance directive? No - Patient declined  Copy of Healthcare Power of Attorney in Chart? Yes - validated most recent copy scanned in chart (See row information)  Pre-existing out of facility DNR order (yellow form or pink MOST form) Pink MOST/Yellow Form most recent copy in chart - Physician notified to receive inpatient order     Chief Complaint  Patient presents with  . Acute Visit    Patient is being seen for pressure ulcer on left foot    HPI:  Pt is a 89 y.o. female seen today for an acute visit for the left heel DTI, unstageable, 2-65mm depth, yellow slough wound bed, small amount odorous yellow drainage, mild redness, slightly warmth and swelling noted. Its painful during examination.    Hospitalized 02/15/23-02/19/23 for left hip fx sustained from a ground fall, ORIF IM nail, healed. Taking Tylenol, Norco for pain. Alk phos 223 03/12/23>>132 04/09/23             Post op anemia, transfused, on Fe,  Hgb 11.8 06/02/23 The  patient's  swelling ankles/BLE, mostly right, 08/04/22 negative DVT, BNP 51 8/31/2 Hx of grade I diastolic dysfunction 2018 per echocardiogram due to SOB, 08/18/22 EF 75%, pulmonary hypertension, mild tricuspid/mitral valve insufficiency, on Furosemide.  Depression/anxiety, stable, on Mirtazapine, Sertraline, Depakote, Lorazepam.  Hx of Hyperlipidemia, not taking statin, LDL 64 02/11/22, takes ASA 81mg  qd, refused statin. CKD Bun/creat 17/2.0 02/24/23 Vit B12 deficiency, takes Vit B12, Vit B12 1010 03/02/20, Hgb 11.8 06/02/23 Dementia, MMSE 19/30 12/22,  takes Exelon, Memantine, resides SN[facility] for safety, care assistance. underwent Neurology consultation. Dyspnea, Hx of 01/08/2017, Grade I diastolic dysfunction 2018 per echocardiogram due to SOB. 08/18/22 Echo EF 75%, pulmonary hypertension, mild mitral/tricuspid valves insufficiency.                               Past Medical History:  Diagnosis Date  . BPV (benign positional vertigo)   . Cancer [facility])    uterine cancer  . Carpal tunnel syndrome on left 06/02/2017  . Cognitive changes   . Depression   . Droopy eyelid, right   . 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 Dyslipidemia   . Fibromyalgia   . Granuloma annulare   . History of uterine cancer 2003   treated with hysterectomy  . Lumbar radicular syndrome    left L5  . Macular degeneration    left eye  . Sciatica   . TIA (transient ischemic attack)    Past Surgical History:  Procedure Laterality Date  .  ABDOMINAL HYSTERECTOMY    . BREAST BIOPSY  2007  . CATARACT EXTRACTION    . INTRAMEDULLARY (IM) NAIL INTERTROCHANTERIC Left 02/16/2023   Procedure: INTRAMEDULLARY (IM) NAIL INTERTROCHANTERIC;  Surgeon: London Sheer, MD;  Location: [facility];  Service: Orthopedics;  Laterality: Left;  . TONSILLECTOMY      Allergies  Allergen Reactions  . Aricept [Donepezil Hcl]     Muscle cramps  . Latex Itching  . Tramadol Other (See Comments)    Pt can't remember what side effects she had, she doesn't take  it now    Outpatient Encounter Medications as of 06/05/2023  Medication Sig  . acetaminophen (TYLENOL) 325 MG tablet Take 650 mg by mouth 2 (two) times daily. Noon and bedtime  . aspirin EC 81 MG tablet Take 81 mg by mouth daily. Swallow whole.  Marland Kitchen CALCIUM-VITAMIN D PO Take 1 tablet by mouth daily. 600mg  calcium, 800 units Vit D  . divalproex (DEPAKOTE) 125 MG DR tablet Take 125 mg by mouth daily. UNSPECIFIED DEMENTIA, UNSPECIFIED SEVERITY, WITHOUT BEHAVIORAL DISTURBANCE, PSYCHOTIC DISTURBANCE, MOOD DISTURBANCE, AND ANXIETY (F03.90)  . ferrous sulfate 325 (65 FE) MG EC tablet Take 325 mg by mouth once. Every Mon, Wed, and Fri  . furosemide (LASIX) 20 MG tablet Take 20 mg by mouth every Monday, Wednesday, and Friday.  . lactose free nutrition (BOOST) LIQD Take 237 mLs by mouth 2 (two) times daily.  . melatonin 3 MG TABS tablet Take 3 mg by mouth at bedtime.  . memantine (NAMENDA) 5 MG tablet Take by mouth 2 (two) times daily.  . mirtazapine (REMERON) 30 MG tablet TAKE ONE TABLET AT BEDTIME.  . Multiple Vitamins-Minerals (SENTRY SENIOR) TABS Take 2 tablets by mouth daily.  . potassium chloride SA (KLOR-CON M) 20 MEQ tablet Take 20 mEq by mouth every Monday, Wednesday, and Friday.  . rivastigmine (EXELON) 4.6 mg/24hr Place 4.6 mg onto the skin daily.  Marland Kitchen senna (SENOKOT) 8.6 MG TABS tablet Take 1 tablet (8.6 mg total) by mouth at bedtime.  . sertraline (ZOLOFT) 50 MG tablet Take 1 tablet (50 mg total) by mouth daily.  . vitamin B-12 (CYANOCOBALAMIN) 500 MCG tablet Take 500 mcg by mouth daily.   No facility-administered encounter medications on file as of 06/05/2023.    Review of Systems  Constitutional:  Negative for appetite change, fatigue and fever.  HENT:  Positive for hearing loss. Negative for congestion, trouble swallowing and voice change.   Eyes:  Negative for visual disturbance.  Respiratory:  Negative for cough and shortness of breath.        ?DOE  Cardiovascular:  Positive for leg  swelling.  Gastrointestinal:  Negative for abdominal pain and constipation.  Genitourinary:  Negative for dysuria, frequency and urgency.  Musculoskeletal:  Positive for gait problem.  Skin:  Positive for wound. Negative for color change.  Neurological:  Negative for speech difficulty and headaches.       Dementia  Psychiatric/Behavioral:  Negative for behavioral problems and sleep disturbance. The patient is nervous/anxious.        Resistance to assistance at times.     Immunization History  Administered Date(s) Administered  . Influenza, High Dose Seasonal PF 09/23/2017, 09/28/2019  . Influenza,inj,Quad PF,6+ Mos 09/16/2018  . Influenza-Unspecified 09/14/2017, 09/26/2020, 10/03/2021, 10/06/2022  . Moderna SARS-COV2 Booster Vaccination 10/23/2020, 05/14/2021  . Moderna Sars-Covid-2 Vaccination 12/19/2019, 01/16/2020, 05/14/2021  . Pneumococcal Conjugate-13 02/21/2014  . Pneumococcal Polysaccharide-23 02/21/2002  . Pneumococcal-Unspecified 12/15/2001  . Tdap 12/15/2009, 10/13/2022  . Unspecified SARS-COV-2  Vaccination 10/16/2022  . Zoster Recombinat (Shingrix) 12/15/2005  . Zoster, Unspecified 12/15/2005   Pertinent  Health Maintenance Due  Topic Date Due  . INFLUENZA VACCINE  07/16/2023  . DEXA SCAN  Completed      12/29/2018    4:11 PM 02/08/2019    1:54 PM 03/07/2020   12:54 PM 02/20/2023    2:52 PM 05/26/2023    9:34 AM  Fall Risk  Falls in the past year? 0 0 0 1 0  Was there an injury with Fall? 0 0  1 0  Fall Risk Category Calculator 0 0  3 0  Fall Risk Category (Retired) Low Low     (RETIRED) Patient Fall Risk Level Low fall risk Low fall risk     Patient at Risk for Falls Due to    History of fall(s) Impaired balance/gait  Fall risk Follow up    Falls evaluation completed Falls evaluation completed   Functional Status Survey:    Vitals:   06/05/23 1227  BP: (!) 146/62  Pulse: 90  Resp: 18  Temp: (!) 97.4 F (36.3 C)  SpO2: 98%  Weight: 150 lb (68 kg)   Height: 5\' 1"  (1.549 m)   Body mass index is 28.34 kg/m. Physical Exam Vitals and nursing note reviewed.  Constitutional:      Appearance: Normal appearance.  HENT:     Head: Normocephalic and atraumatic.     Mouth/Throat:     Mouth: Mucous membranes are moist.  Eyes:     Extraocular Movements: Extraocular movements intact.     Conjunctiva/sclera: Conjunctivae normal.     Pupils: Pupils are equal, round, and reactive to light.  Cardiovascular:     Rate and Rhythm: Normal rate and regular rhythm.     Heart sounds: No murmur heard. Pulmonary:     Effort: Pulmonary effort is normal.     Breath sounds: No rales.  Abdominal:     General: Bowel sounds are normal.     Palpations: Abdomen is soft.     Tenderness: There is no abdominal tenderness.  Musculoskeletal:     Cervical back: Normal range of motion and neck supple.     Right lower leg: No edema.     Left lower leg: Edema present.     Comments: Trace edema LLE  Skin:    General: Skin is warm and dry.     Findings: Erythema present.     Comments: Left hip surgical incision healed. the left heel DTI, unstageable, 2-12mm depth, yellow slough wound bed, small amount odorous yellow drainage, mild redness, slightly warmth and swelling noted. Its painful during examination.  Neurological:     General: No focal deficit present.     Mental Status: She is alert. Mental status is at baseline.     Motor: No weakness.     Coordination: Coordination normal.     Gait: Gait abnormal.     Comments: Oriented to person, place.   Psychiatric:        Mood and Affect: Mood normal.        Behavior: Behavior normal.    Labs reviewed: Recent Labs    02/17/23 0352 02/18/23 0746 02/19/23 0937 02/24/23 0000  NA 132* 134* 135 138  K 4.3 3.6 3.5 4.5  CL 103 103 106 107  CO2 24 24 22 21   GLUCOSE 157* 127* 110*  --   BUN 24* 24* 18 17  CREATININE 1.29* 1.19* 0.97 1.0  CALCIUM 7.6* 7.7* 7.7*  8.3*  MG  --   --  2.6*  --    Recent Labs     02/15/23 2058 02/19/23 0937 02/24/23 0000 04/09/23 0000  AST 24 35 57* 21  ALT 21 13 41* 18  ALKPHOS 54 49 75 132*  BILITOT 0.5 0.9  --   --   PROT 7.0 5.7*  --   --   ALBUMIN 3.8 2.9* 3.5 4.1   Recent Labs    09/04/22 0000 02/15/23 2058 02/16/23 0700 02/17/23 0701 02/17/23 1319 02/18/23 0746 02/19/23 0937 02/24/23 0000  WBC 5.1 10.9*   < > 9.9  --  10.1 10.2 11.1  NEUTROABS 2,938.00 7.8*  --   --   --   --   --  8,181.00  HGB 12.2 11.9*   < > 6.7*   < > 7.4* 9.2* 9.7*  HCT 36 35.7*   < > 20.4*   < > 22.7* 27.7* 29*  MCV  --  97.0   < > 100.5*  --  99.6 95.8  --   PLT  --  247   < > 155  --  130* 153 359   < > = values in this interval not displayed.   Lab Results  Component Value Date   TSH 4.58 04/02/2022   No results found for: "HGBA1C" Lab Results  Component Value Date   CHOL 217 (A) 03/29/2021   HDL 48 03/29/2021   LDLCALC 152 03/29/2021   TRIG 70 03/29/2021   CHOLHDL 4.7 08/01/2019    Significant Diagnostic Results in last 30 days:  XR FEMUR MIN 2 VIEWS LEFT  Result Date: 05/21/2023 X-rays of the left femur taken 05/21/2023 and independently reviewed today, showing comminuted pertrochanteric femur fracture. Lesser trochanter fracture is displaced medially. Slight valgus alignment.  Callus formation is seen medial to the nail and just caudal to the screws going into the femoral head.  Discussed formation seen on both AP and lateral views. The lag screws are still centered within the femoral head with no lucency around them. No evidence of screw cut out.    Assessment/Plan Infected wound the left heel DTI, unstageable, 2-18mm depth, yellow slough wound bed, small amount odorous yellow drainage, mild redness, slightly warmth and swelling noted. Its painful during examination. Will apply collagen AG, cover with foam border dressing, off pressure, start Doxy 100mg  bid x 7 days.   Deep tissue injury the left heel DTI, unstageable, 2-29mm depth, yellow slough wound  bed, small amount odorous yellow drainage, mild redness, slightly warmth and swelling noted. Its painful during examination. Will apply collagen AG, cover with foam border dressing, off pressure, start Doxy 100mg  bid x 7 days.   Acute postoperative anemia due to expected blood loss  transfused, on Fe,  Hgb 11.8 06/02/23  Grade I diastolic dysfunction Hx of grade I diastolic dysfunction 2018 per echocardiogram due to SOB, 08/18/22 EF 75%, pulmonary hypertension, mild tricuspid/mitral valve insufficiency, on Furosemide.   Depression, recurrent (HCC)  stable, on Mirtazapine, Sertraline, Depakote, Lorazepam.   Hyperlipidemia not taking statin, LDL 64 02/11/22, takes ASA 81mg  qd, refused statin.  Stage 3a chronic kidney disease (CKD) (HCC) Bun/creat 17/2.0 02/24/23  Vitamin B12 deficiency takes Vit B12, Vit B12 1010 03/02/20, Hgb 11.8 06/02/23  Senile dementia (HCC) MMSE 19/30 12/22,  takes Exelon, Memantine, resides SNF [facility] for safety, care assistance. underwent Neurology consultation.     Family/ staff Communication: plan of care reviewed with the patient and charge nurse.   Labs/tests  ordered:  none   Time spend 35 minutes.

## 2023-06-05 NOTE — Assessment & Plan Note (Signed)
Hx of grade I diastolic dysfunction 2018 per echocardiogram due to SOB, 08/18/22 EF 75%, pulmonary hypertension, mild tricuspid/mitral valve insufficiency, on Furosemide.  

## 2023-06-05 NOTE — Assessment & Plan Note (Signed)
MMSE 19/30 12/22,  takes Exelon, Memantine, resides SNF [facility] for safety, care assistance. underwent Neurology consultation. 

## 2023-06-05 NOTE — Assessment & Plan Note (Signed)
stable, on Mirtazapine, Sertraline, Depakote, Lorazepam.  

## 2023-06-05 NOTE — Assessment & Plan Note (Signed)
the left heel DTI, unstageable, 2-7mm depth, yellow slough wound bed, small amount odorous yellow drainage, mild redness, slightly warmth and swelling noted. Its painful during examination. Will apply collagen AG, cover with foam border dressing, off pressure, start Doxy 100mg  bid x 7 days.

## 2023-06-05 NOTE — Assessment & Plan Note (Signed)
Bun/creat 17/2.0 02/24/23 

## 2023-06-05 NOTE — Assessment & Plan Note (Signed)
takes Vit B12, Vit B12 1010 03/02/20, Hgb 11.8 06/02/23

## 2023-06-05 NOTE — Assessment & Plan Note (Signed)
the left heel DTI, unstageable, 2-3mm depth, yellow slough wound bed, small amount odorous yellow drainage, mild redness, slightly warmth and swelling noted. Its painful during examination. Will apply collagen AG, cover with foam border dressing, off pressure, start Doxy 100mg bid x 7 days.  

## 2023-06-05 NOTE — Assessment & Plan Note (Signed)
transfused, on Fe,  Hgb 11.8 06/02/23

## 2023-06-05 NOTE — Assessment & Plan Note (Signed)
not taking statin, LDL 64 02/11/22, takes ASA 81mg qd, refused statin.  

## 2023-06-09 ENCOUNTER — Encounter: Payer: Self-pay | Admitting: Nurse Practitioner

## 2023-06-09 ENCOUNTER — Non-Acute Institutional Stay (SKILLED_NURSING_FACILITY): Payer: Medicare PPO | Admitting: Family Medicine

## 2023-06-09 DIAGNOSIS — I5032 Chronic diastolic (congestive) heart failure: Secondary | ICD-10-CM | POA: Diagnosis not present

## 2023-06-09 DIAGNOSIS — F0390 Unspecified dementia without behavioral disturbance: Secondary | ICD-10-CM

## 2023-06-09 DIAGNOSIS — F339 Major depressive disorder, recurrent, unspecified: Secondary | ICD-10-CM

## 2023-06-09 DIAGNOSIS — E782 Mixed hyperlipidemia: Secondary | ICD-10-CM | POA: Diagnosis not present

## 2023-06-09 DIAGNOSIS — D62 Acute posthemorrhagic anemia: Secondary | ICD-10-CM

## 2023-06-09 NOTE — Progress Notes (Signed)
Provider:  Jacalyn Lefevre, MD Location:      Place of Service:     PCP: Mast, Man X, NP Patient Care Team: Mast, Man X, NP as PCP - General (Internal Medicine) Romero Belling, MD as Referring Physician (Physical Medicine and Rehabilitation) Merlene Laughter, MD (Inactive) as Consulting Physician (Internal Medicine)  Extended Emergency Contact Information Primary Emergency Contact: Soyla Dryer, [place] Home Phone: [phone] Relation: Daughter Secondary Emergency Contact: Myers,Lois  [place] Home Phone: [phone] Relation: Daughter  Code Status:  Goals of Care: Advanced Directive information    05/26/2023    9:33 AM  Advanced Directives  Does Patient Have a Medical Advance Directive? Yes  Type of Estate agent of [employer];Living will;Out of facility DNR (pink MOST or yellow form)  Does patient want to make changes to medical advance directive? No - Patient declined  Copy of Healthcare Power of Attorney in Chart? Yes - validated most recent copy scanned in chart (See row information)  Pre-existing out of facility DNR order (yellow form or pink MOST form) Pink MOST/Yellow Form most recent copy in chart - Physician notified to receive inpatient order      No chief complaint on file.   HPI: Patient is a 89 y.o. female seen today for medical management of chronic problems including heel wound, depression, hyperlipidemia, chronic kidney disease, and senile dementia. I found her seated in the living room watching TV.  She is pleasant and able to answer simple questions.  She voices no complaints today.  Endorses sleeping well and good appetite.  Nursing staff did not report any ongoing problems. She was however seen recently for a wound on her left heel with some purulent drainage redness and warmth.  She was started on doxycycline as well as local wound care and protection. She was seen earlier this  month by neurology for her dementia.  Noted Depakote was added for behavior and it has been effective.  Daughter had concerns about cognitive decline after her hip fracture 3 months ago.  She has follow-up appointment in 6 to 8 months. For depression she takes mirtazapine and sertraline and is stable.  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   Dyslipidemia    Fibromyalgia    Granuloma annulare    History of uterine cancer 2003   treated with hysterectomy   Lumbar radicular syndrome    left L5   Macular degeneration    left eye   Sciatica    TIA (transient ischemic attack)    Past Surgical History:  Procedure Laterality Date   ABDOMINAL HYSTERECTOMY     BREAST BIOPSY  2007   CATARACT EXTRACTION     INTRAMEDULLARY (IM) NAIL INTERTROCHANTERIC Left 02/16/2023   Procedure: INTRAMEDULLARY (IM) NAIL INTERTROCHANTERIC;  Surgeon: London Sheer, MD;  Location: [facility];  Service: Orthopedics;  Laterality: Left;   TONSILLECTOMY      reports that she has never smoked. She has never used smokeless tobacco. She reports that she does not drink alcohol and does not use drugs. Social History   Socioeconomic History   Marital status: Widowed    Spouse name: Not on file  Number of children: Not on file   Years of education: 16   Highest education level: Not on file  Occupational History   Occupation: Retired Acupuncturist  Tobacco Use   Smoking status: Never   Smokeless tobacco: Never  Vaping Use   Vaping Use: Never used  Substance and Sexual Activity   Alcohol use: No   Drug use: No   Sexual activity: Not on file  Other Topics Concern   Not on file  Social History Narrative   Lives at [facility]   Left-handed    Caffeine: tea once a day    Social Determinants of Health   Financial Resource Strain: Not on file  Food Insecurity: No Food Insecurity (02/15/2023)   Hunger Vital Sign    Worried About Running Out of Food in the Last Year: Never true    Ran Out of Food in the Last Year: Never true  Transportation Needs: No Transportation Needs (02/15/2023)   PRAPARE - Administrator, Civil Service (Medical): No    Lack of Transportation (Non-Medical): No  Physical Activity: Not on file  Stress: Not on file  Social Connections: Not on file  Intimate Partner Violence: Not At Risk (02/15/2023)   Humiliation, Afraid, Rape, and Kick questionnaire    Fear of Current or Ex-Partner: No    Emotionally Abused: No    Physically Abused: No    Sexually Abused: No    Functional Status Survey:    Family History  Problem Relation Age of Onset   Depression Mother    Diabetes Father    Heart attack Father    Dementia Sister    Pneumonia Brother    Breast cancer Daughter     Health Maintenance  Topic Date Due   COVID-19 Vaccine (7 - 2023-24 season) 12/11/2022   Zoster Vaccines- Shingrix (2 of 2) 06/15/2023 (Originally 02/09/2006)   INFLUENZA VACCINE  07/16/2023   Medicare Annual Wellness (AWV)  05/25/2024   DTaP/Tdap/Td (3 - Td or Tdap) 10/13/2032   Pneumonia Vaccine 81+ Years old  Completed   DEXA SCAN  Completed   HPV VACCINES  Aged Out    Allergies  Allergen Reactions   Aricept [Donepezil Hcl]     Muscle cramps   Latex Itching   Tramadol Other (See Comments)    Pt can't remember what side effects she had, she doesn't take it now    Outpatient Encounter Medications as of 06/09/2023  Medication Sig   acetaminophen (TYLENOL) 325 MG tablet Take 650 mg by mouth 2 (two) times daily. Noon and bedtime   aspirin EC 81 MG tablet Take 81 mg by mouth daily. Swallow whole.   CALCIUM-VITAMIN D PO Take 1 tablet by mouth daily. 600mg  calcium, 800 units Vit D   divalproex (DEPAKOTE) 125 MG DR tablet Take 125 mg by mouth  daily. UNSPECIFIED DEMENTIA, UNSPECIFIED SEVERITY, WITHOUT BEHAVIORAL DISTURBANCE, PSYCHOTIC DISTURBANCE, MOOD DISTURBANCE, AND ANXIETY (F03.90)   ferrous sulfate 325 (65 FE) MG EC tablet Take 325 mg by mouth once. Every Mon, Wed, and Fri   furosemide (LASIX) 20 MG tablet Take 20 mg by mouth every Monday, Wednesday, and Friday.   lactose free nutrition (BOOST) LIQD Take 237 mLs by mouth 2 (two) times daily.   melatonin 3 MG TABS tablet Take 3 mg by mouth at bedtime.   memantine (NAMENDA) 5 MG tablet Take by mouth 2 (two) times daily.   mirtazapine (REMERON) 30 MG tablet TAKE ONE TABLET  AT BEDTIME.   Multiple Vitamins-Minerals (SENTRY SENIOR) TABS Take 2 tablets by mouth daily.   potassium chloride SA (KLOR-CON M) 20 MEQ tablet Take 20 mEq by mouth every Monday, Wednesday, and Friday.   rivastigmine (EXELON) 4.6 mg/24hr Place 4.6 mg onto the skin daily.   senna (SENOKOT) 8.6 MG TABS tablet Take 1 tablet (8.6 mg total) by mouth at bedtime.   sertraline (ZOLOFT) 50 MG tablet Take 1 tablet (50 mg total) by mouth daily.   vitamin B-12 (CYANOCOBALAMIN) 500 MCG tablet Take 500 mcg by mouth daily.   No facility-administered encounter medications on file as of 06/09/2023.    Review of Systems  Constitutional: Negative.   Respiratory: Negative.    Cardiovascular: Negative.   Gastrointestinal: Negative.   Genitourinary: Negative.   Neurological: Negative.   Psychiatric/Behavioral:  Positive for decreased concentration.   All other systems reviewed and are negative.   There were no vitals filed for this visit. There is no height or weight on file to calculate BMI. Physical Exam Vitals and nursing note reviewed.  Constitutional:      Appearance: Normal appearance.  Cardiovascular:     Rate and Rhythm: Normal rate and regular rhythm.  Pulmonary:     Effort: Pulmonary effort is normal.     Breath sounds: Normal breath sounds.  Abdominal:     General: Bowel sounds are normal.  Skin:     General: Skin is warm and dry.  Neurological:     General: No focal deficit present.     Mental Status: She is alert.     Comments: Oriented to person and place  Psychiatric:        Mood and Affect: Mood normal.        Behavior: Behavior normal.     Labs reviewed: Basic Metabolic Panel: Recent Labs    02/17/23 0352 02/18/23 0746 02/19/23 0937 02/24/23 0000  NA 132* 134* 135 138  K 4.3 3.6 3.5 4.5  CL 103 103 106 107  CO2 24 24 22 21   GLUCOSE 157* 127* 110*  --   BUN 24* 24* 18 17  CREATININE 1.29* 1.19* 0.97 1.0  CALCIUM 7.6* 7.7* 7.7* 8.3*  MG  --   --  2.6*  --    Liver Function Tests: Recent Labs    02/15/23 2058 02/19/23 0937 02/24/23 0000 04/09/23 0000  AST 24 35 57* 21  ALT 21 13 41* 18  ALKPHOS 54 49 75 132*  BILITOT 0.5 0.9  --   --   PROT 7.0 5.7*  --   --   ALBUMIN 3.8 2.9* 3.5 4.1   No results for input(s): "LIPASE", "AMYLASE" in the last 8760 hours. No results for input(s): "AMMONIA" in the last 8760 hours. CBC: Recent Labs    09/04/22 0000 02/15/23 2058 02/16/23 0700 02/17/23 0701 02/17/23 1319 02/18/23 0746 02/19/23 0937 02/24/23 0000  WBC 5.1 10.9*   < > 9.9  --  10.1 10.2 11.1  NEUTROABS 2,938.00 7.8*  --   --   --   --   --  8,181.00  HGB 12.2 11.9*   < > 6.7*   < > 7.4* 9.2* 9.7*  HCT 36 35.7*   < > 20.4*   < > 22.7* 27.7* 29*  MCV  --  97.0   < > 100.5*  --  99.6 95.8  --   PLT  --  247   < > 155  --  130* 153 359   < > =  values in this interval not displayed.   Cardiac Enzymes: No results for input(s): "CKTOTAL", "CKMB", "CKMBINDEX", "TROPONINI" in the last 8760 hours. BNP: Invalid input(s): "POCBNP" No results found for: "HGBA1C" Lab Results  Component Value Date   TSH 4.58 04/02/2022   Lab Results  Component Value Date   VITAMINB12 672 02/17/2023   Lab Results  Component Value Date   FOLATE 18.0 02/17/2023   Lab Results  Component Value Date   IRON 22 (L) 02/17/2023   TIBC 265 02/17/2023   FERRITIN 45  02/17/2023    Imaging and Procedures obtained prior to SNF admission: DG FEMUR MIN 2 VIEWS LEFT  Result Date: 02/16/2023 CLINICAL DATA:  Postoperative EXAM: LEFT FEMUR 2 VIEWS COMPARISON:  Left hip x-ray 02/15/2023 FINDINGS: There is a new left femoral intramedullary nail and hip screw fixating comminuted intratrochanteric fracture. Alignment is anatomic. The lesser trochanter is displaced medially. There is no dislocation. There is lateral hip and leg soft tissue swelling, air and skin staples compatible with recent surgery. IMPRESSION: Left femoral intramedullary nail and hip screw fixating comminuted intratrochanteric fracture. Electronically Signed   By: Darliss Cheney M.D.   On: 02/16/2023 21:17   DG FEMUR MIN 2 VIEWS LEFT  Result Date: 02/16/2023 CLINICAL DATA:  Intramedullary nail EXAM: LEFT FEMUR 2 VIEWS COMPARISON:  Hip x-ray 02/15/2023 FINDINGS: Nine intraoperative fluoroscopic views of the left hip. Left-sided hip screw and intramedullary nail fixating intratrochanteric fracture. Alignment is anatomic. Fluoroscopy time: 2 minutes and 56 seconds. Fluoroscopy dose 45.564 micro [place]. IMPRESSION: Left hip screw and intramedullary nail fixating intratrochanteric fracture. Electronically Signed   By: Darliss Cheney M.D.   On: 02/16/2023 20:34   DG C-Arm 1-60 Min-No Report  Result Date: 02/16/2023 Fluoroscopy was utilized by the requesting physician.  No radiographic interpretation.   DG C-Arm 1-60 Min-No Report  Result Date: 02/16/2023 Fluoroscopy was utilized by the requesting physician.  No radiographic interpretation.   DG C-Arm 1-60 Min-No Report  Result Date: 02/16/2023 Fluoroscopy was utilized by the requesting physician.  No radiographic interpretation.   DG Knee 1-2 Views Left  Result Date: 02/16/2023 CLINICAL DATA:  Left knee pain EXAM: LEFT KNEE - 1-2 VIEW COMPARISON:  None Available. FINDINGS: Atypical frontal view due to knee being held in flexion. There is no obvious fracture  involving the left knee on these atypical views. There is tricompartment degenerative change and a probable trace effusion. There is soft tissue swelling anteriorly along the proximal tibia and prepatellar soft tissues. IMPRESSION: Atypical frontal view without obvious fracture involving the left knee. Tricompartment degenerative change with probable trace effusion. Soft tissue swelling anteriorly along the proximal lower leg and prepatellar soft tissues. Electronically Signed   By: Caprice Renshaw M.D.   On: 02/16/2023 16:51   DG Chest Portable 1 View  Result Date: 02/15/2023 CLINICAL DATA:  Hip fracture. EXAM: PORTABLE CHEST 1 VIEW COMPARISON:  Chest radiograph dated May 22, 2007 FINDINGS: The heart is normal in size. Atherosclerotic calcification of the aortic arch. Low lung volumes with left basilar opacity suggesting atelectasis or small effusion. Thoracic spondylosis. No acute osseous abnormality. IMPRESSION: Low lung volumes with left basilar opacity suggesting atelectasis or small effusion. Electronically Signed   By: Larose Hires D.O.   On: 02/15/2023 22:07   DG Hip Unilat W or Wo Pelvis 2-3 Views Left  Result Date: 02/15/2023 CLINICAL DATA:  Recent fall with hip pain, initial encounter EXAM: DG HIP (WITH OR WITHOUT PELVIS) 3V LEFT COMPARISON:  None Available. FINDINGS:  Pelvic ring appears intact. There is a significantly comminuted left intratrochanteric fracture with impaction and angulation at the fracture site. Femoral head is well seated. No soft tissue abnormality is noted. IMPRESSION: Severely comminuted left intratrochanteric fracture. Electronically Signed   By: Alcide Clever M.D.   On: 02/15/2023 22:05   CT HEAD WO CONTRAST ( )  Result Date: 02/15/2023 CLINICAL DATA:  Recent fall with headaches and neck pain, initial encounter EXAM: CT HEAD WITHOUT CONTRAST CT CERVICAL SPINE WITHOUT CONTRAST TECHNIQUE: Multidetector CT imaging of the head and cervical spine was performed following the  standard protocol without intravenous contrast. Multiplanar CT image reconstructions of the cervical spine were also generated. RADIATION DOSE REDUCTION: This exam was performed according to the departmental dose-optimization program which includes automated exposure control, adjustment of the mA and/or kV according to patient size and/or use of iterative reconstruction technique. COMPARISON:  05/21/2007 FINDINGS: CT HEAD FINDINGS Brain: No evidence of acute infarction, hemorrhage, hydrocephalus, extra-axial collection or mass lesion/mass effect. Progressive atrophic changes are noted commenced with the patient's given age. Chronic white matter ischemic changes are seen. Small lacunar infarct in the right basal ganglia is noted. Vascular: No hyperdense vessel or unexpected calcification. Skull: Normal. Negative for fracture or focal lesion. Sinuses/Orbits: No acute finding. Other: None CT CERVICAL SPINE FINDINGS Alignment: Mild loss of normal cervical lordosis is noted. Skull base and vertebrae: 7 cervical segments are well visualized. Vertebral body height is well maintained. Multilevel osteophytic changes are seen. Multilevel disc space narrowing and facet hypertrophic changes are noted. No acute fracture or acute facet abnormality is noted. Soft tissues and spinal canal: Surrounding soft tissue structures demonstrate diffuse vascular calcifications. No hematoma or other focal abnormality is noted. Upper chest: Visualized lung apices are within normal limits. Other: None IMPRESSION: CT of the head: Progressive atrophic and ischemic changes commensurate with the patient's given age. No acute abnormality noted. CT of the cervical spine: Multilevel degenerative change without acute abnormality. Electronically Signed   By: Alcide Clever M.D.   On: 02/15/2023 21:58   CT Cervical Spine Wo Contrast  Result Date: 02/15/2023 CLINICAL DATA:  Recent fall with headaches and neck pain, initial encounter EXAM: CT HEAD WITHOUT  CONTRAST CT CERVICAL SPINE WITHOUT CONTRAST TECHNIQUE: Multidetector CT imaging of the head and cervical spine was performed following the standard protocol without intravenous contrast. Multiplanar CT image reconstructions of the cervical spine were also generated. RADIATION DOSE REDUCTION: This exam was performed according to the departmental dose-optimization program which includes automated exposure control, adjustment of the mA and/or kV according to patient size and/or use of iterative reconstruction technique. COMPARISON:  05/21/2007 FINDINGS: CT HEAD FINDINGS Brain: No evidence of acute infarction, hemorrhage, hydrocephalus, extra-axial collection or mass lesion/mass effect. Progressive atrophic changes are noted commenced with the patient's given age. Chronic white matter ischemic changes are seen. Small lacunar infarct in the right basal ganglia is noted. Vascular: No hyperdense vessel or unexpected calcification. Skull: Normal. Negative for fracture or focal lesion. Sinuses/Orbits: No acute finding. Other: None CT CERVICAL SPINE FINDINGS Alignment: Mild loss of normal cervical lordosis is noted. Skull base and vertebrae: 7 cervical segments are well visualized. Vertebral body height is well maintained. Multilevel osteophytic changes are seen. Multilevel disc space narrowing and facet hypertrophic changes are noted. No acute fracture or acute facet abnormality is noted. Soft tissues and spinal canal: Surrounding soft tissue structures demonstrate diffuse vascular calcifications. No hematoma or other focal abnormality is noted. Upper chest: Visualized lung apices are within normal limits.  Other: None IMPRESSION: CT of the head: Progressive atrophic and ischemic changes commensurate with the patient's given age. No acute abnormality noted. CT of the cervical spine: Multilevel degenerative change without acute abnormality. Electronically Signed   By: Alcide Clever M.D.   On: 02/15/2023 21:58     Assessment/Plan 1. Acute postoperative anemia due to expected blood loss Likely related to hip surgery was transfused  2. Chronic diastolic CHF (congestive heart failure) (HCC) No edema today and lungs are clear  3. Dementia arising in the senium and presenium (HCC) There has been gradual decline in her mental status testing but she is still able to carry on relatively reasonable conversation  4. Depression, recurrent (HCC) Taking mirtazapine and sertraline  5. Mixed hyperlipidemia Off statin    Family/ staff Communication:   Labs/tests ordered:  Bertram Millard. Hyacinth Meeker, MD [facility] [address] [place], [place] [address] Office [phone]

## 2023-06-24 ENCOUNTER — Non-Acute Institutional Stay (SKILLED_NURSING_FACILITY): Payer: Medicare PPO | Admitting: Nurse Practitioner

## 2023-06-24 ENCOUNTER — Encounter: Payer: Self-pay | Admitting: Nurse Practitioner

## 2023-06-24 DIAGNOSIS — I5032 Chronic diastolic (congestive) heart failure: Secondary | ICD-10-CM | POA: Diagnosis not present

## 2023-06-24 DIAGNOSIS — T148XXA Other injury of unspecified body region, initial encounter: Secondary | ICD-10-CM

## 2023-06-24 DIAGNOSIS — F339 Major depressive disorder, recurrent, unspecified: Secondary | ICD-10-CM

## 2023-06-24 DIAGNOSIS — N1831 Chronic kidney disease, stage 3a: Secondary | ICD-10-CM

## 2023-06-24 DIAGNOSIS — D62 Acute posthemorrhagic anemia: Secondary | ICD-10-CM

## 2023-06-24 DIAGNOSIS — F0390 Unspecified dementia without behavioral disturbance: Secondary | ICD-10-CM | POA: Diagnosis not present

## 2023-06-24 DIAGNOSIS — E538 Deficiency of other specified B group vitamins: Secondary | ICD-10-CM

## 2023-06-24 NOTE — Assessment & Plan Note (Signed)
Bun/creat 17/2.0 02/24/23 

## 2023-06-24 NOTE — Assessment & Plan Note (Signed)
Hx of grade I diastolic dysfunction 2018 per echocardiogram due to SOB, 08/18/22 EF 75%, pulmonary hypertension, mild tricuspid/mitral valve insufficiency, on Furosemide.  

## 2023-06-24 NOTE — Assessment & Plan Note (Signed)
stable, on Mirtazapine, Sertraline, Depakote, Lorazepam.  

## 2023-06-24 NOTE — Assessment & Plan Note (Signed)
unstageable, 2-19mm depth, yellow slough wound bed, small amount odorous yellow drainage, mild redness, slightly warmth and swelling, improved, treated with doxycycline x 7 days 06/05/23

## 2023-06-24 NOTE — Assessment & Plan Note (Signed)
MMSE 19/30 12/22,  takes Exelon, Memantine, resides SNF [facility] for safety, care assistance. underwent Neurology consultation. 

## 2023-06-24 NOTE — Assessment & Plan Note (Signed)
transfused, on Fe,  Hgb 11.8 06/02/23 

## 2023-06-24 NOTE — Assessment & Plan Note (Signed)
takes Vit B12, Vit B12 1010 03/02/20, Hgb 11.8 06/02/23 

## 2023-06-24 NOTE — Progress Notes (Signed)
Location:   [facility] Room Number: 6 Place of Service:  SNF (31) Provider: Arna Snipe Honestee Revard NP  Binnie Vonderhaar X, NP  Patient Care Team: Mikkel Charrette X, NP as PCP - General (Internal Medicine) Romero Belling, MD as Referring Physician (Physical Medicine and Rehabilitation) Merlene Laughter, MD (Inactive) as Consulting Physician (Internal Medicine)  Extended Emergency Contact Information Primary Emergency Contact: Soyla Dryer, [facility] [place] Home Phone: [phone] Relation: Daughter Secondary Emergency Contact: Myers,Lois  [place] Home Phone: [phone] Relation: Daughter  Code Status:  DNR Goals of care: Advanced Directive information    05/26/2023    9:33 AM  Advanced Directives  Does Patient Have a Medical Advance Directive? Yes  Type of Estate agent of [place];Living will;Out of facility DNR (pink MOST or yellow form)  Does patient want to make changes to medical advance directive? No - Patient declined  Copy of Healthcare Power of Attorney in Chart? Yes - validated most recent copy scanned in chart (See row information)  Pre-existing out of facility DNR order (yellow form or pink MOST form) Pink MOST/Yellow Form most recent copy in chart - Physician notified to receive inpatient order     Chief Complaint  Patient presents with   Medical Management of Chronic Issues    HPI:  Pt is a 89 y.o. female seen today for medical management of chronic diseases.    left heel DTI, unstageable, 2-32mm depth, yellow slough wound bed, small amount odorous yellow drainage, mild redness, slightly warmth and swelling, improved, treated with doxycycline x 7 days 06/05/23              Hospitalized 02/15/23-02/19/23 for left hip fx sustained from a ground fall, ORIF IM nail, healed. Taking Tylenol, Norco for pain. Alk phos 223 03/12/23>>132 04/09/23             Post op anemia, transfused, on Fe,  Hgb 11.8 06/02/23 The   patient's swelling ankles/BLE, mostly right, 08/04/22 negative DVT, BNP 51 8/31/2 Hx of grade I diastolic dysfunction 2018 per echocardiogram due to SOB, 08/18/22 EF 75%, pulmonary hypertension, mild tricuspid/mitral valve insufficiency, on Furosemide.  Depression/anxiety, stable, on Mirtazapine, Sertraline, Depakote, Lorazepam.  Hx of Hyperlipidemia, not taking statin, LDL 64 02/11/22, takes ASA 81mg  qd, refused statin. CKD Bun/creat 17/2.0 02/24/23 Vit B12 deficiency, takes Vit B12, Vit B12 1010 03/02/20, Hgb 11.8 06/02/23 Dementia, MMSE 19/30 12/22,  takes Exelon, Memantine, resides SNF [facility] for safety, care assistance. underwent Neurology consultation. Dyspnea, Hx of 01/08/2017, Grade I diastolic dysfunction 2018 per echocardiogram due to SOB. 08/18/22 Echo EF 75%, pulmonary hypertension, mild mitral/tricuspid valves insufficiency.               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   Dyslipidemia    Fibromyalgia    Granuloma annulare    History of uterine cancer 2003   treated with hysterectomy   Lumbar radicular syndrome    left L5   Macular degeneration    left eye   Sciatica    TIA (transient ischemic attack)    Past Surgical History:  Procedure Laterality Date   ABDOMINAL HYSTERECTOMY  BREAST BIOPSY  2007   CATARACT EXTRACTION     INTRAMEDULLARY (IM) NAIL INTERTROCHANTERIC Left 02/16/2023   Procedure: INTRAMEDULLARY (IM) NAIL INTERTROCHANTERIC;  Surgeon: London Sheer, MD;  Location: [facility];  Service: Orthopedics;  Laterality: Left;   TONSILLECTOMY      Allergies  Allergen Reactions   Aricept [Donepezil Hcl]     Muscle cramps   Latex Itching   Tramadol Other (See Comments)    Pt can't remember what side effects she had, she doesn't take it now    Allergies as of  06/24/2023       Reactions   Aricept [donepezil Hcl]    Muscle cramps   Latex Itching   Tramadol Other (See Comments)   Pt can't remember what side effects she had, she doesn't take it now        Medication List        Accurate as of June 24, 2023  4:02 PM. If you have any questions, ask your nurse or doctor.          acetaminophen 325 MG tablet Commonly known as: TYLENOL Take 650 mg by mouth 2 (two) times daily. Noon and bedtime   aspirin EC 81 MG tablet Take 81 mg by mouth daily. Swallow whole.   CALCIUM-VITAMIN D PO Take 1 tablet by mouth daily. 600mg  calcium, 800 units Vit D   divalproex 125 MG DR tablet Commonly known as: DEPAKOTE Take 125 mg by mouth daily. UNSPECIFIED DEMENTIA, UNSPECIFIED SEVERITY, WITHOUT BEHAVIORAL DISTURBANCE, PSYCHOTIC DISTURBANCE, MOOD DISTURBANCE, AND ANXIETY (F03.90)   ferrous sulfate 325 (65 FE) MG EC tablet Take 325 mg by mouth once. Every Mon, Wed, and Fri   furosemide 20 MG tablet Commonly known as: LASIX Take 20 mg by mouth every Monday, Wednesday, and Friday.   lactose free nutrition Liqd Take 237 mLs by mouth 2 (two) times daily.   melatonin 3 MG Tabs tablet Take 3 mg by mouth at bedtime.   memantine 5 MG tablet Commonly known as: NAMENDA Take by mouth 2 (two) times daily.   mirtazapine 30 MG tablet Commonly known as: REMERON TAKE ONE TABLET AT BEDTIME.   potassium chloride SA 20 MEQ tablet Commonly known as: KLOR-CON M Take 20 mEq by mouth every Monday, Wednesday, and Friday.   rivastigmine 4.6 mg/24hr Commonly known as: EXELON Place 4.6 mg onto the skin daily.   senna 8.6 MG Tabs tablet Commonly known as: SENOKOT Take 1 tablet (8.6 mg total) by mouth at bedtime.   Sentry Senior Tabs Take 2 tablets by mouth daily.   sertraline 50 MG tablet Commonly known as: ZOLOFT Take 1 tablet (50 mg total) by mouth daily.   vitamin B-12 500 MCG tablet Commonly known as: CYANOCOBALAMIN Take 500 mcg by mouth daily.         Review of Systems  Constitutional:  Negative for appetite change, fatigue and fever.  HENT:  Positive for hearing loss. Negative for congestion, trouble swallowing and voice change.   Eyes:  Negative for visual disturbance.  Respiratory:  Negative for cough and shortness of breath.        ?DOE  Cardiovascular:  Positive for leg swelling.  Gastrointestinal:  Negative for abdominal pain and constipation.  Genitourinary:  Negative for dysuria, frequency and urgency.  Musculoskeletal:  Positive for gait problem.  Skin:  Positive for wound. Negative for color change.  Neurological:  Negative for speech difficulty and headaches.       Dementia  Psychiatric/Behavioral:  Negative  for behavioral problems and sleep disturbance. The patient is nervous/anxious.        Resistance to assistance at times.     Immunization History  Administered Date(s) Administered   Influenza, High Dose Seasonal PF 09/23/2017, 09/28/2019   Influenza,inj,Quad PF,6+ Mos 09/16/2018   Influenza-Unspecified 09/14/2017, 09/26/2020, 10/03/2021, 10/06/2022   Moderna SARS-COV2 Booster Vaccination 10/23/2020, 05/14/2021   Moderna Sars-Covid-2 Vaccination 12/19/2019, 01/16/2020, 05/14/2021   Pneumococcal Conjugate-13 02/21/2014   Pneumococcal Polysaccharide-23 02/21/2002   Pneumococcal-Unspecified 12/15/2001   Tdap 12/15/2009, 10/13/2022   Unspecified SARS-COV-2 Vaccination 10/16/2022   Zoster Recombinant(Shingrix) 12/15/2005   Zoster, Unspecified 12/15/2005   Pertinent  Health Maintenance Due  Topic Date Due   INFLUENZA VACCINE  07/16/2023   DEXA SCAN  Completed      12/29/2018    4:11 PM 02/08/2019    1:54 PM 03/07/2020   12:54 PM 02/20/2023    2:52 PM 05/26/2023    9:34 AM  Fall Risk  Falls in the past year? 0 0 0 1 0  Was there an injury with Fall? 0 0  1 0  Fall Risk Category Calculator 0 0  3 0  Fall Risk Category (Retired) Low Low     (RETIRED) Patient Fall Risk Level Low fall risk Low fall risk      Patient at Risk for Falls Due to    History of fall(s) Impaired balance/gait  Fall risk Follow up    Falls evaluation completed Falls evaluation completed   Functional Status Survey:    Vitals:   06/24/23 1205  BP: (!) 143/72  Pulse: 90  Resp: 17  Temp: 98.5 F (36.9 C)  SpO2: 95%  Weight: 155 lb 12.8 oz (70.7 kg)   Body mass index is 29.44 kg/m. Physical Exam Vitals and nursing note reviewed.  Constitutional:      Appearance: Normal appearance.  HENT:     Head: Normocephalic and atraumatic.     Mouth/Throat:     Mouth: Mucous membranes are moist.  Eyes:     Extraocular Movements: Extraocular movements intact.     Conjunctiva/sclera: Conjunctivae normal.     Pupils: Pupils are equal, round, and reactive to light.  Cardiovascular:     Rate and Rhythm: Normal rate and regular rhythm.     Heart sounds: No murmur heard. Pulmonary:     Effort: Pulmonary effort is normal.     Breath sounds: No rales.  Abdominal:     General: Bowel sounds are normal.     Palpations: Abdomen is soft.     Tenderness: There is no abdominal tenderness.  Musculoskeletal:     Cervical back: Normal range of motion and neck supple.     Right lower leg: No edema.     Left lower leg: Edema present.     Comments: Trace edema LLE  Skin:    General: Skin is warm and dry.     Findings: Erythema present.     Comments: Left hip surgical incision healed. the left heel DTI, unstageable, 2-65mm depth, yellow slough wound bed, small amount odorous yellow drainage, mild redness, slightly warmth and swelling, slow healing.   Neurological:     General: No focal deficit present.     Mental Status: She is alert. Mental status is at baseline.     Motor: No weakness.     Coordination: Coordination normal.     Gait: Gait abnormal.     Comments: Oriented to person, place.   Psychiatric:  Mood and Affect: Mood normal.        Behavior: Behavior normal.     Labs reviewed: Recent Labs    02/17/23 0352  02/18/23 0746 02/19/23 0937 02/24/23 0000  NA 132* 134* 135 138  K 4.3 3.6 3.5 4.5  CL 103 103 106 107  CO2 24 24 22 21   GLUCOSE 157* 127* 110*  --   BUN 24* 24* 18 17  CREATININE 1.29* 1.19* 0.97 1.0  CALCIUM 7.6* 7.7* 7.7* 8.3*  MG  --   --  2.6*  --    Recent Labs    02/15/23 2058 02/19/23 0937 02/24/23 0000 04/09/23 0000  AST 24 35 57* 21  ALT 21 13 41* 18  ALKPHOS 54 49 75 132*  BILITOT 0.5 0.9  --   --   PROT 7.0 5.7*  --   --   ALBUMIN 3.8 2.9* 3.5 4.1   Recent Labs    09/04/22 0000 02/15/23 2058 02/16/23 0700 02/17/23 0701 02/17/23 1319 02/18/23 0746 02/19/23 0937 02/24/23 0000  WBC 5.1 10.9*   < > 9.9  --  10.1 10.2 11.1  NEUTROABS 2,938.00 7.8*  --   --   --   --   --  8,181.00  HGB 12.2 11.9*   < > 6.7*   < > 7.4* 9.2* 9.7*  HCT 36 35.7*   < > 20.4*   < > 22.7* 27.7* 29*  MCV  --  97.0   < > 100.5*  --  99.6 95.8  --   PLT  --  247   < > 155  --  130* 153 359   < > = values in this interval not displayed.   Lab Results  Component Value Date   TSH 4.58 04/02/2022   No results found for: "HGBA1C" Lab Results  Component Value Date   CHOL 217 (A) 03/29/2021   HDL 48 03/29/2021   LDLCALC 152 03/29/2021   TRIG 70 03/29/2021   CHOLHDL 4.7 08/01/2019    Significant Diagnostic Results in last 30 days:  No results found.  Assessment/Plan  Acute postoperative anemia due to expected blood loss transfused, on Fe,  Hgb 11.8 06/02/23  Chronic diastolic CHF (congestive heart failure) (HCC) Hx of grade I diastolic dysfunction 2018 per echocardiogram due to SOB, 08/18/22 EF 75%, pulmonary hypertension, mild tricuspid/mitral valve insufficiency, on Furosemide.   Depression, recurrent (HCC) stable, on Mirtazapine, Sertraline, Depakote, Lorazepam.   Stage 3a chronic kidney disease (CKD) (HCC) Bun/creat 17/2.0 02/24/23  Vitamin B12 deficiency takes Vit B12, Vit B12 1010 03/02/20, Hgb 11.8 06/02/23  Senile dementia (HCC) MMSE 19/30 12/22,  takes Exelon,  Memantine, resides SNF [facility] for safety, care assistance. underwent Neurology consultation.  Deep tissue injury  unstageable, 2-85mm depth, yellow slough wound bed, small amount odorous yellow drainage, mild redness, slightly warmth and swelling, improved, treated with doxycycline x 7 days 06/05/23   Family/ staff Communication: plan of care reviewed with the patient and charge nurse  Labs/tests ordered:  none  Time spend 35 minutes.

## 2023-07-06 ENCOUNTER — Non-Acute Institutional Stay (SKILLED_NURSING_FACILITY): Payer: Medicare PPO | Admitting: Orthopedic Surgery

## 2023-07-06 ENCOUNTER — Encounter: Payer: Self-pay | Admitting: Orthopedic Surgery

## 2023-07-06 DIAGNOSIS — M1611 Unilateral primary osteoarthritis, right hip: Secondary | ICD-10-CM | POA: Diagnosis not present

## 2023-07-06 DIAGNOSIS — M85851 Other specified disorders of bone density and structure, right thigh: Secondary | ICD-10-CM | POA: Diagnosis not present

## 2023-07-06 DIAGNOSIS — M25551 Pain in right hip: Secondary | ICD-10-CM

## 2023-07-06 NOTE — Progress Notes (Unsigned)
Location:   [facility] Room Number: 26-A Place of Service:  SNF (31) Provider:  Hazle Nordmann, NP  PCP: Mast, Man X, NP  Patient Care Team: Mast, Man X, NP as PCP - General (Internal Medicine) Romero Belling, MD as Referring Physician (Physical Medicine and Rehabilitation) Merlene Laughter, MD (Inactive) as Consulting Physician (Internal Medicine)  Extended Emergency Contact Information Primary Emergency Contact: Soyla Dryer, [place] Home Phone: [phone] Relation: Daughter Secondary Emergency Contact: Myers,Lois  [place] Home Phone: [phone] Relation: Daughter  Code Status:  DNR Goals of care: Advanced Directive information    07/06/2023    2:31 PM  Advanced Directives  Does Patient Have a Medical Advance Directive? Yes  Type of Estate agent of [place];Living will;Out of facility DNR (pink MOST or yellow form)  Does patient want to make changes to medical advance directive? No - Patient declined  Copy of Healthcare Power of Attorney in Chart? Yes - validated most recent copy scanned in chart (See row information)     Chief Complaint  Patient presents with   Acute Visit    Right hip pain.    HPI:  Pt is a 89 y.o. 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   Dyslipidemia    Fibromyalgia    Granuloma annulare    History of uterine cancer 2003   treated with hysterectomy   Lumbar radicular syndrome    left L5   Macular degeneration    left eye   Sciatica    TIA (transient ischemic attack)    Past Surgical History:  Procedure Laterality Date   ABDOMINAL HYSTERECTOMY     BREAST BIOPSY  2007   CATARACT  EXTRACTION     INTRAMEDULLARY (IM) NAIL INTERTROCHANTERIC Left 02/16/2023   Procedure: INTRAMEDULLARY (IM) NAIL INTERTROCHANTERIC;  Surgeon: London Sheer, MD;  Location: [facility];  Service: Orthopedics;  Laterality: Left;   TONSILLECTOMY      Allergies  Allergen Reactions   Aricept [Donepezil Hcl]     Muscle cramps   Latex Itching   Tramadol Other (See Comments)    Pt can't remember what side effects she had, she doesn't take it now    Allergies as of 07/06/2023       Reactions   Aricept [donepezil Hcl]    Muscle cramps   Latex Itching   Tramadol Other (See Comments)   Pt can't remember what side effects she had, she doesn't take it now        Medication List        Accurate as of July 06, 2023  2:32 PM. If you have any questions, ask your nurse or doctor.          acetaminophen 325 MG tablet Commonly known as: TYLENOL Take 650 mg by mouth 2 (two) times daily. Noon & Bedtime   acetaminophen 325 MG tablet Commonly known as: TYLENOL Take 650 mg by mouth at bedtime.   aspirin EC 81 MG tablet Take 81 mg by mouth daily. Swallow whole.   CALCIUM-VITAMIN D PO Take 1 tablet by  mouth daily. 600mg  calcium, 800 units Vit D   divalproex 125 MG DR tablet Commonly known as: DEPAKOTE Take 125 mg by mouth daily. UNSPECIFIED DEMENTIA, UNSPECIFIED SEVERITY, WITHOUT BEHAVIORAL DISTURBANCE, PSYCHOTIC DISTURBANCE, MOOD DISTURBANCE, AND ANXIETY (F03.90)   FERROUS SULFATE PO Take 7.5 mLs by mouth daily. What changed: Another medication with the same name was removed. Continue taking this medication, and follow the directions you see here. Changed by: Octavia Heir   furosemide 20 MG tablet Commonly known as: LASIX Take 20 mg by mouth every Monday, Wednesday, and Friday.   lactose free nutrition Liqd Take 237 mLs by mouth 2 (two) times daily.   melatonin 3 MG Tabs tablet Take 3 mg by mouth at bedtime.   memantine 5 MG tablet Commonly known as: NAMENDA Take by mouth 2  (two) times daily.   MiraLax 17 g packet Generic drug: polyethylene glycol Take 17 g by mouth as needed.   mirtazapine 30 MG tablet Commonly known as: REMERON TAKE ONE TABLET AT BEDTIME.   potassium chloride SA 20 MEQ tablet Commonly known as: KLOR-CON M Take 20 mEq by mouth every Monday, Wednesday, and Friday.   rivastigmine 4.6 mg/24hr Commonly known as: EXELON Place 4.6 mg onto the skin daily.   senna 8.6 MG Tabs tablet Commonly known as: SENOKOT Take 1 tablet (8.6 mg total) by mouth at bedtime.   Sentry Senior Tabs Take 2 tablets by mouth daily.   sertraline 50 MG tablet Commonly known as: ZOLOFT Take 1 tablet (50 mg total) by mouth daily.   vitamin B-12 500 MCG tablet Commonly known as: CYANOCOBALAMIN Take 500 mcg by mouth daily.        Review of Systems  Immunization History  Administered Date(s) Administered   Influenza, High Dose Seasonal PF 09/23/2017, 09/28/2019   Influenza,inj,Quad PF,6+ Mos 09/16/2018   Influenza-Unspecified 09/14/2017, 09/26/2020, 10/03/2021, 10/06/2022   Moderna SARS-COV2 Booster Vaccination 10/23/2020, 05/14/2021   Moderna Sars-Covid-2 Vaccination 12/19/2019, 01/16/2020, 05/14/2021   Pneumococcal Conjugate-13 02/21/2014   Pneumococcal Polysaccharide-23 02/21/2002   Pneumococcal-Unspecified 12/15/2001   Tdap 12/15/2009, 10/13/2022   Unspecified SARS-COV-2 Vaccination 10/16/2022   Zoster Recombinant(Shingrix) 12/15/2005   Zoster, Unspecified 12/15/2005   Pertinent  Health Maintenance Due  Topic Date Due   INFLUENZA VACCINE  07/16/2023   DEXA SCAN  Completed      12/29/2018    4:11 PM 02/08/2019    1:54 PM 03/07/2020   12:54 PM 02/20/2023    2:52 PM 05/26/2023    9:34 AM  Fall Risk  Falls in the past year? 0 0 0 1 0  Was there an injury with Fall? 0 0  1 0  Fall Risk Category Calculator 0 0  3 0  Fall Risk Category (Retired) Low Low     (RETIRED) Patient Fall Risk Level Low fall risk Low fall risk     Patient at Risk for  Falls Due to    History of fall(s) Impaired balance/gait  Fall risk Follow up    Falls evaluation completed Falls evaluation completed   Functional Status Survey:    Vitals:   07/06/23 1422  BP: (!) 116/48  Pulse: 90  Resp: 17  Temp: 98.5 F (36.9 C)  SpO2: 95%  Weight: 192 lb 3.2 oz (87.2 kg)  Height: 5\' 1"  (1.549 m)   Body mass index is 36.32 kg/m. Physical Exam  Labs reviewed: Recent Labs    02/17/23 0352 02/18/23 0746 02/19/23 0937 02/24/23 0000  NA 132* 134* 135 138  K 4.3 3.6 3.5 4.5  CL 103 103 106 107  CO2 24 24 22 21   GLUCOSE 157* 127* 110*  --   BUN 24* 24* 18 17  CREATININE 1.29* 1.19* 0.97 1.0  CALCIUM 7.6* 7.7* 7.7* 8.3*  MG  --   --  2.6*  --    Recent Labs    02/15/23 2058 02/19/23 0937 02/24/23 0000 04/09/23 0000  AST 24 35 57* 21  ALT 21 13 41* 18  ALKPHOS 54 49 75 132*  BILITOT 0.5 0.9  --   --   PROT 7.0 5.7*  --   --   ALBUMIN 3.8 2.9* 3.5 4.1   Recent Labs    02/15/23 2058 02/16/23 0700 02/17/23 0701 02/17/23 1319 02/18/23 0746 02/19/23 0937 02/24/23 0000 06/02/23 0000  WBC 10.9*   < > 9.9  --  10.1 10.2 11.1 6.8  NEUTROABS 7.8*  --   --   --   --   --  8,181.00 4,313.00  HGB 11.9*   < > 6.7*   < > 7.4* 9.2* 9.7* 11.8*  HCT 35.7*   < > 20.4*   < > 22.7* 27.7* 29* 35*  MCV 97.0   < > 100.5*  --  99.6 95.8  --   --   PLT 247   < > 155  --  130* 153 359 282   < > = values in this interval not displayed.   Lab Results  Component Value Date   TSH 4.58 04/02/2022   No results found for: "HGBA1C" Lab Results  Component Value Date   CHOL 217 (A) 03/29/2021   HDL 48 03/29/2021   LDLCALC 152 03/29/2021   TRIG 70 03/29/2021   CHOLHDL 4.7 08/01/2019    Significant Diagnostic Results in last 30 days:  No results found.  Assessment/Plan There are no diagnoses linked to this encounter.   Family/ staff Communication:   Labs/tests ordered:

## 2023-07-07 ENCOUNTER — Encounter: Payer: Self-pay | Admitting: Nurse Practitioner

## 2023-07-07 ENCOUNTER — Non-Acute Institutional Stay: Payer: Self-pay | Admitting: Nurse Practitioner

## 2023-07-07 DIAGNOSIS — T148XXA Other injury of unspecified body region, initial encounter: Secondary | ICD-10-CM

## 2023-07-07 DIAGNOSIS — F0390 Unspecified dementia without behavioral disturbance: Secondary | ICD-10-CM

## 2023-07-07 DIAGNOSIS — D509 Iron deficiency anemia, unspecified: Secondary | ICD-10-CM

## 2023-07-07 DIAGNOSIS — M150 Primary generalized (osteo)arthritis: Secondary | ICD-10-CM

## 2023-07-07 DIAGNOSIS — R635 Abnormal weight gain: Secondary | ICD-10-CM | POA: Diagnosis not present

## 2023-07-07 DIAGNOSIS — M159 Polyosteoarthritis, unspecified: Secondary | ICD-10-CM | POA: Diagnosis not present

## 2023-07-07 DIAGNOSIS — I5189 Other ill-defined heart diseases: Secondary | ICD-10-CM | POA: Diagnosis not present

## 2023-07-07 DIAGNOSIS — F339 Major depressive disorder, recurrent, unspecified: Secondary | ICD-10-CM

## 2023-07-07 DIAGNOSIS — I959 Hypotension, unspecified: Secondary | ICD-10-CM

## 2023-07-07 NOTE — Assessment & Plan Note (Signed)
stable, decrease Mirtazapine 15mg /30mg  every day in setting of weigh gains, continue Sertraline, Depakote, Lorazepam.

## 2023-07-07 NOTE — Assessment & Plan Note (Signed)
left heel DTI, unstageable, 2-92mm depth, yellow slough wound bed, small amount odorous yellow drainage, mild redness, slightly warmth and swelling, improved, treated with doxycycline x 7 days 06/05/23 Slow healing.

## 2023-07-07 NOTE — Progress Notes (Signed)
Location:   SNF [facility] Room Number: 23 A Place of Service:  SNF (31) Provider: Arna Snipe Qaadir Kent NP  Alassane Kalafut X, NP  Patient Care Team: Antuane Eastridge X, NP as PCP - General (Internal Medicine) Romero Belling, MD as Referring Physician (Physical Medicine and Rehabilitation) Merlene Laughter, MD (Inactive) as Consulting Physician (Internal Medicine)  Extended Emergency Contact Information Primary Emergency Contact: Soyla Dryer, [address] [place] Home Phone: [phone] Relation: Daughter Secondary Emergency Contact: Myers,Lois  [place] Home Phone: [phone] Relation: Daughter  Code Status: DNR Goals of care: Advanced Directive information    07/07/2023   12:22 PM  Advanced Directives  Does Patient Have a Medical Advance Directive? Yes  Type of Estate agent of [place];Living will;Out of facility DNR (pink MOST or yellow form)  Does patient want to make changes to medical advance directive? No - Patient declined  Copy of Healthcare Power of Attorney in Chart? Yes - validated most recent copy scanned in chart (See row information)  Pre-existing out of facility DNR order (yellow form or pink MOST form) Pink MOST form placed in chart (order not valid for inpatient use);Yellow form placed in chart (order not valid for inpatient use)     Chief Complaint  Patient presents with   Acute Visit    Weight gained    HPI:  Pt is a 89 y.o. female seen today for an acute visit for weight gained about #40Ibs in 1-2 weeks? (#155.8Ibs 06/17/23, #194.5Ibs 06/24/23, #192.2Ibs 07/01/23). The patient's leg swelling and DOE at her baseline, no O2 desaturation. Otherwise her weight gain has been gradual: #156Ibs 06/17/23, #151Ibs 05/18/23, #148Ibs 04/17/23, #148Ibs 03/22/23, #152Ibs 02/20/23  Bp 120/84 06/29/23, 116/48 07/06/23, the patient appears in her usual state of health.    left heel DTI, unstageable, 2-72mm depth, yellow slough wound  bed, small amount odorous yellow drainage, mild redness, slightly warmth and swelling, improved, treated with doxycycline x 7 days 06/05/23              Hospitalized 02/15/23-02/19/23 for left hip fx sustained from a ground fall, ORIF IM nail, healed. Taking Tylenol, Norco for pain. Alk phos 223 03/12/23>>132 04/09/23  R hip pain, X-ray R hip no acute fx or dislocation             Post op anemia, transfused, on Fe,  Hgb 11.8 06/02/23 The  patient's swelling ankles/BLE, mostly right, 08/04/22 negative DVT, BNP 51 in the past.  Hx of grade I diastolic dysfunction 2018 per echocardiogram due to SOB, 08/18/22 EF 75%, pulmonary hypertension, mild tricuspid/mitral valve insufficiency, on Furosemide.  Depression/anxiety, stable, on Mirtazapine, Sertraline, Depakote, Lorazepam.  Hx of Hyperlipidemia, not taking statin, LDL 64 02/11/22, takes ASA 81mg  qd, refused statin. CKD Bun/creat 17/2.0 02/24/23 Vit B12 deficiency, takes Vit B12, Vit B12 1010 03/02/20, Hgb 11.8 06/02/23 Dementia, MMSE 19/30 12/22,  takes Exelon, Memantine, resides SNF [facility] for safety, care assistance. underwent Neurology consultation. Dyspnea, Hx of 01/08/2017, Grade I diastolic dysfunction 2018 per echocardiogram due to SOB. 08/18/22 Echo EF 75%, pulmonary hypertension, mild mitral/tricuspid valves insufficiency.                 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   Dyslipidemia    Fibromyalgia    Granuloma annulare    History of uterine cancer 2003   treated with hysterectomy   Lumbar radicular syndrome    left L5   Macular degeneration    left eye   Sciatica    TIA (transient ischemic attack)    Past Surgical History:  Procedure Laterality Date   ABDOMINAL HYSTERECTOMY     BREAST BIOPSY  2007   CATARACT EXTRACTION      INTRAMEDULLARY (IM) NAIL INTERTROCHANTERIC Left 02/16/2023   Procedure: INTRAMEDULLARY (IM) NAIL INTERTROCHANTERIC;  Surgeon: London Sheer, MD;  Location: [facility];  Service: Orthopedics;  Laterality: Left;   TONSILLECTOMY      Allergies  Allergen Reactions   Aricept [Donepezil Hcl]     Muscle cramps   Latex Itching   Tramadol Other (See Comments)    Pt can't remember what side effects she had, she doesn't take it now    Allergies as of 07/07/2023       Reactions   Aricept [donepezil Hcl]    Muscle cramps   Latex Itching   Tramadol Other (See Comments)   Pt can't remember what side effects she had, she doesn't take it now        Medication List        Accurate as of July 07, 2023  1:26 PM. If you have any questions, ask your nurse or doctor.          STOP taking these medications    CALCIUM-VITAMIN D PO Stopped by: Marcelline Temkin X Marie Chow       TAKE these medications    acetaminophen 500 MG tablet Commonly known as: TYLENOL Take 1,000 mg by mouth 2 (two) times daily. What changed: Another medication with the same name was removed. Continue taking this medication, and follow the directions you see here. Changed by: Nannette Zill X Ridley Dileo   aspirin EC 81 MG tablet Take 81 mg by mouth daily. Swallow whole.   Caltrate 600+D Plus Minerals 600-800 MG-UNIT Tabs Take 1 tablet by mouth daily.   divalproex 125 MG DR tablet Commonly known as: DEPAKOTE Take 125 mg by mouth daily. UNSPECIFIED DEMENTIA, UNSPECIFIED SEVERITY, WITHOUT BEHAVIORAL DISTURBANCE, PSYCHOTIC DISTURBANCE, MOOD DISTURBANCE, AND ANXIETY (F03.90)   FERROUS SULFATE PO Take 7.5 mLs by mouth daily.   furosemide 20 MG tablet Commonly known as: LASIX Take 20 mg by mouth every Monday, Wednesday, and Friday.   lactose free nutrition Liqd Take 237 mLs by mouth 2 (two) times daily.   melatonin 3 MG Tabs tablet Take 3 mg by mouth at bedtime.   memantine 5 MG tablet Commonly known as: NAMENDA Take by mouth 2 (two) times  daily.   MiraLax 17 g packet Generic drug: polyethylene glycol Take 17 g by mouth as needed.   mirtazapine 30 MG tablet Commonly known as: REMERON TAKE ONE TABLET AT BEDTIME.   potassium chloride SA 20 MEQ tablet Commonly known as: KLOR-CON M Take 20 mEq by mouth every Monday, Wednesday, and Friday.   rivastigmine 4.6 mg/24hr Commonly known as: EXELON Place 4.6 mg onto the skin daily.   senna 8.6 MG Tabs tablet Commonly known as: SENOKOT Take 1 tablet (8.6 mg total) by mouth at bedtime.   Sentry Senior Tabs Take 1 tablet by mouth daily.   sertraline 50 MG tablet Commonly known as: ZOLOFT Take 1 tablet (50 mg total) by mouth daily.   vitamin B-12  500 MCG tablet Commonly known as: CYANOCOBALAMIN Take 500 mcg by mouth daily.        Review of Systems  Constitutional:  Positive for unexpected weight change. Negative for appetite change, fatigue and fever.  HENT:  Positive for hearing loss. Negative for congestion, trouble swallowing and voice change.   Eyes:  Negative for visual disturbance.  Respiratory:  Negative for cough and shortness of breath.        ?DOE  Cardiovascular:  Positive for leg swelling.  Gastrointestinal:  Negative for abdominal pain and constipation.  Genitourinary:  Negative for dysuria, frequency and urgency.  Musculoskeletal:  Positive for gait problem.  Skin:  Positive for wound. Negative for color change.  Neurological:  Negative for speech difficulty and headaches.       Dementia  Psychiatric/Behavioral:  Negative for behavioral problems and sleep disturbance. The patient is nervous/anxious.        Resistance to assistance at times.     Immunization History  Administered Date(s) Administered   Influenza, High Dose Seasonal PF 09/23/2017, 09/28/2019   Influenza,inj,Quad PF,6+ Mos 09/16/2018   Influenza-Unspecified 09/14/2017, 09/26/2020, 10/03/2021, 10/06/2022   Moderna SARS-COV2 Booster Vaccination 10/23/2020, 05/14/2021   Moderna  Sars-Covid-2 Vaccination 12/19/2019, 01/16/2020, 05/14/2021   Pneumococcal Conjugate-13 02/21/2014   Pneumococcal Polysaccharide-23 02/21/2002   Pneumococcal-Unspecified 12/15/2001   Tdap 12/15/2009, 10/13/2022   Unspecified SARS-COV-2 Vaccination 10/16/2022   Zoster Recombinant(Shingrix) 12/15/2005   Zoster, Unspecified 12/15/2005   Pertinent  Health Maintenance Due  Topic Date Due   INFLUENZA VACCINE  07/16/2023   DEXA SCAN  Completed      12/29/2018    4:11 PM 02/08/2019    1:54 PM 03/07/2020   12:54 PM 02/20/2023    2:52 PM 05/26/2023    9:34 AM  Fall Risk  Falls in the past year? 0 0 0 1 0  Was there an injury with Fall? 0 0  1 0  Fall Risk Category Calculator 0 0  3 0  Fall Risk Category (Retired) Low Low     (RETIRED) Patient Fall Risk Level Low fall risk Low fall risk     Patient at Risk for Falls Due to    History of fall(s) Impaired balance/gait  Fall risk Follow up    Falls evaluation completed Falls evaluation completed   Functional Status Survey:    Vitals:   07/07/23 1218  BP: (!) 116/48  Pulse: 90  Resp: 17  Temp: 98.5 F (36.9 C)  SpO2: 95%  Weight: 192 lb 3.2 oz (87.2 kg)  Height: 5\' 1"  (1.549 m)   Body mass index is 36.32 kg/m. Physical Exam Vitals and nursing note reviewed.  Constitutional:      Appearance: Normal appearance.  HENT:     Head: Normocephalic and atraumatic.     Mouth/Throat:     Mouth: Mucous membranes are moist.  Eyes:     Extraocular Movements: Extraocular movements intact.     Conjunctiva/sclera: Conjunctivae normal.     Pupils: Pupils are equal, round, and reactive to light.  Cardiovascular:     Rate and Rhythm: Normal rate and regular rhythm.     Heart sounds: No murmur heard. Pulmonary:     Effort: Pulmonary effort is normal.     Breath sounds: No rales.  Abdominal:     General: Bowel sounds are normal.     Palpations: Abdomen is soft.     Tenderness: There is no abdominal tenderness.  Musculoskeletal:     Cervical  back:  Normal range of motion and neck supple.     Right lower leg: No edema.     Left lower leg: Edema present.     Comments: Trace edema LLE  Skin:    General: Skin is warm and dry.     Findings: Erythema present.     Comments: Left hip surgical incision healed. the left heel DTI, unstageable, 2-32mm depth, yellow slough wound bed, small amount odorous yellow drainage, mild redness, slightly warmth and swelling, slow healing.   Neurological:     General: No focal deficit present.     Mental Status: She is alert. Mental status is at baseline.     Motor: No weakness.     Coordination: Coordination normal.     Gait: Gait abnormal.     Comments: Oriented to person, place.   Psychiatric:        Mood and Affect: Mood normal.        Behavior: Behavior normal.     Labs reviewed: Recent Labs    02/17/23 0352 02/18/23 0746 02/19/23 0937 02/24/23 0000  NA 132* 134* 135 138  K 4.3 3.6 3.5 4.5  CL 103 103 106 107  CO2 24 24 22 21   GLUCOSE 157* 127* 110*  --   BUN 24* 24* 18 17  CREATININE 1.29* 1.19* 0.97 1.0  CALCIUM 7.6* 7.7* 7.7* 8.3*  MG  --   --  2.6*  --    Recent Labs    02/15/23 2058 02/19/23 0937 02/24/23 0000 04/09/23 0000  AST 24 35 57* 21  ALT 21 13 41* 18  ALKPHOS 54 49 75 132*  BILITOT 0.5 0.9  --   --   PROT 7.0 5.7*  --   --   ALBUMIN 3.8 2.9* 3.5 4.1   Recent Labs    02/15/23 2058 02/16/23 0700 02/17/23 0701 02/17/23 1319 02/18/23 0746 02/19/23 0937 02/24/23 0000 06/02/23 0000  WBC 10.9*   < > 9.9  --  10.1 10.2 11.1 6.8  NEUTROABS 7.8*  --   --   --   --   --  8,181.00 4,313.00  HGB 11.9*   < > 6.7*   < > 7.4* 9.2* 9.7* 11.8*  HCT 35.7*   < > 20.4*   < > 22.7* 27.7* 29* 35*  MCV 97.0   < > 100.5*  --  99.6 95.8  --   --   PLT 247   < > 155  --  130* 153 359 282   < > = values in this interval not displayed.   Lab Results  Component Value Date   TSH 4.58 04/02/2022   No results found for: "HGBA1C" Lab Results  Component Value Date   CHOL  217 (A) 03/29/2021   HDL 48 03/29/2021   LDLCALC 152 03/29/2021   TRIG 70 03/29/2021   CHOLHDL 4.7 08/01/2019    Significant Diagnostic Results in last 30 days:  No results found.  Assessment/Plan: Weight gain weight gained about #40Ibs in 1-2 weeks? (#155.8Ibs 06/17/23, #194.5Ibs 06/24/23, #192.2Ibs 07/01/23). The patient's leg swelling and DOE at her baseline, no O2 desaturation.   Otherwise her weight gain has been gradual: #156Ibs 06/17/23, #151Ibs 05/18/23, #148Ibs 04/17/23, #148Ibs 03/22/23, #152Ibs 02/20/23 Will weight 2x/wk ac breakfast, obtain CBC/diff, CMP/eGFR, BNP, reduce Mirtazapine 15mg /30mg  qd  Hypotension Bp 120/84 06/29/23, 116/48 07/06/23, the patient appears in her usual state of health. Monitor Bp daily as well as the patient.   Deep tissue injury  left  heel DTI, unstageable, 2-37mm depth, yellow slough wound bed, small amount odorous yellow drainage, mild redness, slightly warmth and swelling, improved, treated with doxycycline x 7 days 06/05/23 Slow healing.   Osteoarthritis, multiple sites Hospitalized 02/15/23-02/19/23 for left hip fx sustained from a ground fall, ORIF IM nail, healed. Taking Tylenol, Norco for pain. Alk phos 223 03/12/23>>132 04/09/23  R hip pain, X-ray R hip no acute fx or dislocation  Iron deficiency anemia  transfused, on Fe,  Hgb 11.8 06/02/23  Grade I diastolic dysfunction Hx of grade I diastolic dysfunction 2018 per echocardiogram due to SOB, 08/18/22 EF 75%, pulmonary hypertension, mild tricuspid/mitral valve insufficiency, on Furosemide.   Depression, recurrent (HCC)  stable, decrease Mirtazapine 15mg /30mg  every day in setting of weigh gains, continue Sertraline, Depakote, Lorazepam.   Senile dementia (HCC) takes Exelon, Memantine, resides SNF [facility] for safety, care assistance. underwent Neurology consultation.    Family/ staff Communication: plan of care reviewed with the patient, the patient's daughter, and charge nurse.   Labs/tests ordered:  BNP,  CBC/diff, CMP/eGFR, TSH  Time spend 35 minutes.

## 2023-07-07 NOTE — Assessment & Plan Note (Signed)
Hx of grade I diastolic dysfunction 2018 per echocardiogram due to SOB, 08/18/22 EF 75%, pulmonary hypertension, mild tricuspid/mitral valve insufficiency, on Furosemide.  

## 2023-07-07 NOTE — Assessment & Plan Note (Addendum)
weight gained about #40Ibs in 1-2 weeks? (#155.8Ibs 06/17/23, #194.5Ibs 06/24/23, #192.2Ibs 07/01/23). The patient's leg swelling and DOE at her baseline, no O2 desaturation.   Otherwise her weight gain has been gradual: #156Ibs 06/17/23, #151Ibs 05/18/23, #148Ibs 04/17/23, #148Ibs 03/22/23, #152Ibs 02/20/23 Will weight 2x/wk ac breakfast, obtain CBC/diff, CMP/eGFR, BNP, reduce Mirtazapine 15mg /30mg  qd

## 2023-07-07 NOTE — Assessment & Plan Note (Signed)
Bp 120/84 06/29/23, 116/48 07/06/23, the patient appears in her usual state of health. Monitor Bp daily as well as the patient.

## 2023-07-07 NOTE — Assessment & Plan Note (Signed)
takes Exelon, Memantine, resides SNF [facility] for safety, care assistance. underwent Neurology consultation.

## 2023-07-07 NOTE — Assessment & Plan Note (Signed)
transfused, on Fe,  Hgb 11.8 06/02/23

## 2023-07-07 NOTE — Assessment & Plan Note (Signed)
Hospitalized 02/15/23-02/19/23 for left hip fx sustained from a ground fall, ORIF IM nail, healed. Taking Tylenol, Norco for pain. Alk phos 223 03/12/23>>132 04/09/23  R hip pain, X-ray R hip no acute fx or dislocation

## 2023-07-07 NOTE — Telephone Encounter (Signed)
Message forwarded to Mast, Man X, NP to review and respond directly back to sender

## 2023-07-09 DIAGNOSIS — I5032 Chronic diastolic (congestive) heart failure: Secondary | ICD-10-CM | POA: Diagnosis not present

## 2023-07-09 DIAGNOSIS — F03A0 Unspecified dementia, mild, without behavioral disturbance, psychotic disturbance, mood disturbance, and anxiety: Secondary | ICD-10-CM | POA: Diagnosis not present

## 2023-07-09 DIAGNOSIS — R609 Edema, unspecified: Secondary | ICD-10-CM | POA: Diagnosis not present

## 2023-07-09 DIAGNOSIS — I10 Essential (primary) hypertension: Secondary | ICD-10-CM | POA: Diagnosis not present

## 2023-07-09 DIAGNOSIS — F330 Major depressive disorder, recurrent, mild: Secondary | ICD-10-CM | POA: Diagnosis not present

## 2023-07-09 LAB — CBC AND DIFFERENTIAL
HCT: 34 — AB (ref 36–46)
Hemoglobin: 11.7 — AB (ref 12.0–16.0)
Neutrophils Absolute: 3959
Platelets: 253 10*3/uL (ref 150–400)
WBC: 6.5

## 2023-07-09 LAB — COMPREHENSIVE METABOLIC PANEL
Albumin: 4 (ref 3.5–5.0)
Calcium: 9.2 (ref 8.7–10.7)
Globulin: 2.5

## 2023-07-09 LAB — BASIC METABOLIC PANEL
BUN: 24 — AB (ref 4–21)
CO2: 18 (ref 13–22)
Chloride: 106 (ref 99–108)
Creatinine: 1.2 — AB (ref 0.5–1.1)
Glucose: 103
Potassium: 4.5 mEq/L (ref 3.5–5.1)
Sodium: 140 (ref 137–147)

## 2023-07-09 LAB — TSH: TSH: 4.36 (ref 0.41–5.90)

## 2023-07-09 LAB — HEPATIC FUNCTION PANEL
ALT: 17 U/L (ref 7–35)
AST: 17 (ref 13–35)
Alkaline Phosphatase: 66 (ref 25–125)
Bilirubin, Total: 0.4

## 2023-07-09 LAB — CBC: RBC: 3.62 — AB (ref 3.87–5.11)

## 2023-07-28 ENCOUNTER — Encounter: Payer: Self-pay | Admitting: Nurse Practitioner

## 2023-07-28 ENCOUNTER — Non-Acute Institutional Stay (SKILLED_NURSING_FACILITY): Payer: Medicare PPO | Admitting: Nurse Practitioner

## 2023-07-28 DIAGNOSIS — I5032 Chronic diastolic (congestive) heart failure: Secondary | ICD-10-CM

## 2023-07-28 DIAGNOSIS — F0390 Unspecified dementia without behavioral disturbance: Secondary | ICD-10-CM | POA: Diagnosis not present

## 2023-07-28 DIAGNOSIS — E782 Mixed hyperlipidemia: Secondary | ICD-10-CM

## 2023-07-28 DIAGNOSIS — N1831 Chronic kidney disease, stage 3a: Secondary | ICD-10-CM | POA: Diagnosis not present

## 2023-07-28 DIAGNOSIS — F339 Major depressive disorder, recurrent, unspecified: Secondary | ICD-10-CM | POA: Diagnosis not present

## 2023-07-28 DIAGNOSIS — T148XXA Other injury of unspecified body region, initial encounter: Secondary | ICD-10-CM | POA: Diagnosis not present

## 2023-07-28 DIAGNOSIS — E538 Deficiency of other specified B group vitamins: Secondary | ICD-10-CM | POA: Diagnosis not present

## 2023-07-28 NOTE — Assessment & Plan Note (Signed)
Bun/creat 24/1.2 07/09/23

## 2023-07-28 NOTE — Assessment & Plan Note (Signed)
takes Vit B12, Hgb 11.7 07/09/23

## 2023-07-28 NOTE — Assessment & Plan Note (Signed)
not taking statin, LDL 64 02/11/22, takes ASA 81mg qd, refused statin. 

## 2023-07-28 NOTE — Assessment & Plan Note (Signed)
unstageable, improved, no s/s of infection

## 2023-07-28 NOTE — Assessment & Plan Note (Signed)
MMSE 19/30 12/22,  takes Exelon, Memantine, resides SNF [facility] for safety, care assistance. underwent Neurology consultation.

## 2023-07-28 NOTE — Assessment & Plan Note (Signed)
stable, on Mirtazapine, Sertraline, Depakote, TSH 4.36 07/09/23

## 2023-07-28 NOTE — Progress Notes (Unsigned)
Location:  [facility] Room Number: N026-A Place of Service:  SNF (31) Provider:  ,  X, NP  Patient Care Team: ,  X, NP as PCP - General (Internal Medicine) Romero Belling, MD as Referring Physician (Physical Medicine and Rehabilitation) Merlene Laughter, MD (Inactive) as Consulting Physician (Internal Medicine)  Extended Emergency Contact Information Primary Emergency Contact: Soyla Dryer, [place] Home Phone: [phone] Relation: Daughter Secondary Emergency Contact: Myers,Lois  [place] Home Phone: [phone] Relation: Daughter  Code Status:  DNR Goals of care: Advanced Directive information    07/28/2023    8:21 AM  Advanced Directives  Does Patient Have a Medical Advance Directive? Yes  Type of Estate agent of [place];Living will;Out of facility DNR (pink MOST or yellow form)  Does patient want to make changes to medical advance directive? No - Patient declined  Copy of Healthcare Power of Attorney in Chart? Yes - validated most recent copy scanned in chart (See row information)  Pre-existing out of facility DNR order (yellow form or pink MOST form) Pink MOST form placed in chart (order not valid for inpatient use);Yellow form placed in chart (order not valid for inpatient use)     Chief Complaint  Patient presents with   Medical agement of Chronic Issues    Routine visit, discuss need for shingrix, covid booster, and flu vaccine.     HPI:  Pt is a 89 y.o. female seen today for medical management of chronic diseases.      left heel DTI, unstageable, improved, no s/s of infection              Hospitalized 02/15/23-02/19/23 for left hip fx sustained from a ground fall, ORIF IM nail, healed. Taking Tylenol, Norco for pain. Alk phos 223 03/12/23>>132 04/09/23             R hip pain, X-ray R hip no acute fx or dislocation             Post op anemia,  transfused, on Fe,  Hgb 11. 7 07/09/23 The  patient's swelling ankles/BLE, mostly right, 08/04/22 negative DVT, BNP 51 in the past.  Hx of grade I diastolic dysfunction 2018 per echocardiogram due to SOB, 08/18/22 EF 75%, pulmonary hypertension, mild tricuspid/mitral valve insufficiency, on Furosemide, weight has been stable, 07/09/23 BNP 54 Depression/anxiety, stable, on Mirtazapine, Sertraline, Depakote, TSH 4.36 07/09/23 Hx of Hyperlipidemia, not taking statin, LDL 64 02/11/22, takes ASA 81mg  qd, refused statin. CKD Bun/creat 24/1.2 07/09/23 Vit B12 deficiency, takes Vit B12, Hgb 11.7 07/09/23 Dementia, MMSE 19/30 12/22,  takes Exelon, Memantine, resides SNF [facility] for safety, care assistance. underwent Neurology consultation. Dyspnea, Hx of 01/08/2017, Grade I diastolic dysfunction 2018 per echocardiogram due to SOB. 08/18/22 Echo EF 75%, pulmonary hypertension, mild mitral/tricuspid valves insufficiency.                         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   Dyslipidemia    Fibromyalgia    Granuloma annulare    History of uterine cancer 2003   treated with hysterectomy   Lumbar radicular syndrome  left L5   Macular degeneration    left eye   Sciatica    TIA (transient ischemic attack)    Past Surgical History:  Procedure Laterality Date   ABDOMINAL HYSTERECTOMY     BREAST BIOPSY  2007   CATARACT EXTRACTION     INTRAMEDULLARY (IM) NAIL INTERTROCHANTERIC Left 02/16/2023   Procedure: INTRAMEDULLARY (IM) NAIL INTERTROCHANTERIC;  Surgeon: London Sheer, MD;  Location: [facility];  Service: Orthopedics;  Laterality: Left;   TONSILLECTOMY      Allergies  Allergen Reactions   Aricept [Donepezil Hcl]     Muscle cramps   Latex Itching   Tramadol Other (See Comments)    Pt  can't remember what side effects she had, she doesn't take it now    Outpatient Encounter Medications as of 07/28/2023  Medication Sig   acetaminophen (TYLENOL) 500 MG tablet Take 1,000 mg by mouth 2 (two) times daily.   aspirin EC 81 MG tablet Take 81 mg by mouth daily. Swallow whole.   Calcium Carbonate-Vit D-Min (CALTRATE 600+D PLUS MINERALS) 600-800 MG-UNIT TABS Take 1 tablet by mouth daily.   divalproex (DEPAKOTE) 125 MG DR tablet Take 125 mg by mouth daily. UNSPECIFIED DEMENTIA, UNSPECIFIED SEVERITY, WITHOUT BEHAVIORAL DISTURBANCE, PSYCHOTIC DISTURBANCE, MOOD DISTURBANCE, AND ANXIETY (F03.90)   FERROUS SULFATE PO Take 7.5 mLs by mouth daily.   furosemide (LASIX) 20 MG tablet Take 20 mg by mouth every Monday, Wednesday, and Friday.   lactose free nutrition (BOOST) LIQD Take 237 mLs by mouth as needed.   melatonin 3 MG TABS tablet Take 3 mg by mouth at bedtime.   memantine (NAMENDA) 5 MG tablet Take by mouth 2 (two) times daily.   mirtazapine (REMERON) 30 MG tablet TAKE ONE TABLET AT BEDTIME.   Multiple Vitamins-Minerals (SENTRY SENIOR) TABS Take 1 tablet by mouth daily.   polyethylene glycol (MIRALAX) 17 g packet Take 17 g by mouth as needed.   potassium chloride SA (KLOR-CON M) 20 MEQ tablet Take 20 mEq by mouth every Monday, Wednesday, and Friday.   rivastigmine (EXELON) 4.6 mg/24hr Place 4.6 mg onto the skin daily.   senna (SENOKOT) 8.6 MG TABS tablet Take 1 tablet (8.6 mg total) by mouth at bedtime.   sertraline (ZOLOFT) 50 MG tablet Take 1 tablet (50 mg total) by mouth daily.   vitamin B-12 (CYANOCOBALAMIN) 500 MCG tablet Take 500 mcg by mouth daily.   No facility-administered encounter medications on file as of 07/28/2023.    Review of Systems  Constitutional:  Negative for fatigue, fever and unexpected weight change.  HENT:  Positive for hearing loss. Negative for congestion and trouble swallowing.   Eyes:  Negative for visual disturbance.  Respiratory:  Negative for cough and  shortness of breath.        ?DOE  Cardiovascular:  Positive for leg swelling.  Gastrointestinal:  Negative for abdominal pain and constipation.  Genitourinary:  Negative for dysuria and urgency.  Musculoskeletal:  Positive for arthralgias and gait problem.  Skin:  Positive for wound. Negative for color change.  Neurological:  Negative for speech difficulty and headaches.       Dementia  Psychiatric/Behavioral:  Negative for behavioral problems and sleep disturbance. The patient is nervous/anxious.        Resistance to assistance at times.     Immunization History  Administered Date(s) Administered   Influenza, High Dose Seasonal PF 09/23/2017, 09/28/2019   Influenza,inj,Quad PF,6+ Mos 09/16/2018   Influenza-Unspecified 09/14/2017, 09/26/2020, 10/03/2021, 10/06/2022   Moderna SARS-COV2 Booster  Vaccination 10/23/2020, 05/14/2021   Moderna Sars-Covid-2 Vaccination 12/19/2019, 01/16/2020, 05/14/2021   Pneumococcal Conjugate-13 02/21/2014   Pneumococcal Polysaccharide-23 02/21/2002   Pneumococcal-Unspecified 12/15/2001   Tdap 12/15/2009, 10/13/2022   Unspecified SARS-COV-2 Vaccination 10/16/2022   Zoster Recombinant(Shingrix) 12/15/2005   Zoster, Unspecified 12/15/2005   Pertinent  Health Maintenance Due  Topic Date Due   INFLUENZA VACCINE  07/16/2023   DEXA SCAN  Completed      12/29/2018    4:11 PM 02/08/2019    1:54 PM 03/07/2020   12:54 PM 02/20/2023    2:52 PM 05/26/2023    9:34 AM  Fall Risk  Falls in the past year? 0 0 0 1 0  Was there an injury with Fall? 0 0  1 0  Fall Risk Category Calculator 0 0  3 0  Fall Risk Category (Retired) Low Low     (RETIRED) Patient Fall Risk Level Low fall risk Low fall risk     Patient at Risk for Falls Due to    History of fall(s) Impaired balance/gait  Fall risk Follow up    Falls evaluation completed Falls evaluation completed   Functional Status Survey:    Vitals:   07/28/23 0820  BP: 132/70  Pulse: 88  SpO2: 96%  Weight: 151  lb 4.8 oz (68.6 kg)  Height: 5\' 1"  (1.549 m)   Body mass index is 28.59 kg/m. Physical Exam Vitals and nursing note reviewed.  Constitutional:      Appearance: Normal appearance.  HENT:     Head: Normocephalic and atraumatic.     Mouth/Throat:     Mouth: Mucous membranes are moist.  Eyes:     Extraocular Movements: Extraocular movements intact.     Conjunctiva/sclera: Conjunctivae normal.     Pupils: Pupils are equal, round, and reactive to light.  Cardiovascular:     Rate and Rhythm: Normal rate and regular rhythm.     Heart sounds: No murmur heard. Pulmonary:     Effort: Pulmonary effort is normal.     Breath sounds: No rales.  Abdominal:     General: Bowel sounds are normal.     Palpations: Abdomen is soft.     Tenderness: There is no abdominal tenderness.  Musculoskeletal:     Cervical back: Normal range of motion and neck supple.     Right lower leg: No edema.     Left lower leg: Edema present.     Comments: Trace edema LLE  Skin:    General: Skin is warm and dry.     Comments: Left hip surgical incision healed. the left heel DTI, unstageable, slow healing, no s/s of infection  Neurological:     General: No focal deficit present.     Mental Status: She is alert. Mental status is at baseline.     Motor: No weakness.     Coordination: Coordination normal.     Gait: Gait abnormal.     Comments: Oriented to person, place.   Psychiatric:        Mood and Affect: Mood normal.        Behavior: Behavior normal.     Labs reviewed: Recent Labs    02/17/23 0352 02/18/23 0746 02/19/23 0937 02/24/23 0000 07/09/23 0000  NA 132* 134* 135 138 140  K 4.3 3.6 3.5 4.5 4.5  CL 103 103 106 107 106  CO2 24 24 22 21 18   GLUCOSE 157* 127* 110*  --   --   BUN 24* 24* 18 17  24*  CREATININE 1.29* 1.19* 0.97 1.0 1.2*  CALCIUM 7.6* 7.7* 7.7* 8.3* 9.2  MG  --   --  2.6*  --   --    Recent Labs    02/15/23 2058 02/19/23 0937 02/24/23 0000 04/09/23 0000 07/09/23 0000  AST  24 35 57* 21 17  ALT 21 13 41* 18 17  ALKPHOS 54 49 75 132* 66  BILITOT 0.5 0.9  --   --   --   PROT 7.0 5.7*  --   --   --   ALBUMIN 3.8 2.9* 3.5 4.1 4.0   Recent Labs    02/17/23 0701 02/17/23 1319 02/18/23 0746 02/19/23 0937 02/24/23 0000 06/02/23 0000 07/09/23 0000  WBC 9.9  --  10.1 10.2 11.1 6.8 6.5  NEUTROABS  --   --   --   --  8,181.00 4,313.00 3,959.00  HGB 6.7*   < > 7.4* 9.2* 9.7* 11.8* 11.7*  HCT 20.4*   < > 22.7* 27.7* 29* 35* 34*  MCV 100.5*  --  99.6 95.8  --   --   --   PLT 155  --  130* 153 359 282 253   < > = values in this interval not displayed.   Lab Results  Component Value Date   TSH 4.36 07/09/2023   No results found for: "HGBA1C" Lab Results  Component Value Date   CHOL 217 (A) 03/29/2021   HDL 48 03/29/2021   LDLCALC 152 03/29/2021   TRIG 70 03/29/2021   CHOLHDL 4.7 08/01/2019    Significant Diagnostic Results in last 30 days:  No results found.  Assessment/Plan Hyperlipidemia not taking statin, LDL 64 02/11/22, takes ASA 81mg  qd, refused statin.  Chronic diastolic CHF (congestive heart failure) (HCC) on Furosemide, weight has been stable, 07/09/23 BNP 54, compensated.   Depression, recurrent (HCC) stable, on Mirtazapine, Sertraline, Depakote, TSH 4.36 07/09/23  Stage 3a chronic kidney disease (CKD) (HCC) Bun/creat 24/1.2 07/09/23  Vitamin B12 deficiency takes Vit B12, Hgb 11.7 07/09/23  Senile dementia (HCC) MMSE 19/30 12/22,  takes Exelon, Memantine, resides SNF [facility] for safety, care assistance. underwent Neurology consultation.  Deep tissue injury unstageable, improved, no s/s of infection     Family/ staff Communication: plan of care reviewed with the patient and charge nurse.   Labs/tests ordered:  none  Time spend 25 minutes.

## 2023-07-28 NOTE — Assessment & Plan Note (Signed)
on Furosemide, weight has been stable, 07/09/23 BNP 54, compensated.

## 2023-08-07 DIAGNOSIS — F03A0 Unspecified dementia, mild, without behavioral disturbance, psychotic disturbance, mood disturbance, and anxiety: Secondary | ICD-10-CM | POA: Diagnosis not present

## 2023-08-07 DIAGNOSIS — F330 Major depressive disorder, recurrent, mild: Secondary | ICD-10-CM | POA: Diagnosis not present

## 2023-08-21 ENCOUNTER — Encounter: Payer: Self-pay | Admitting: Sports Medicine

## 2023-08-21 ENCOUNTER — Non-Acute Institutional Stay (SKILLED_NURSING_FACILITY): Payer: Medicare PPO | Admitting: Sports Medicine

## 2023-08-21 DIAGNOSIS — K5901 Slow transit constipation: Secondary | ICD-10-CM

## 2023-08-21 DIAGNOSIS — D509 Iron deficiency anemia, unspecified: Secondary | ICD-10-CM

## 2023-08-21 DIAGNOSIS — I5032 Chronic diastolic (congestive) heart failure: Secondary | ICD-10-CM | POA: Diagnosis not present

## 2023-08-21 DIAGNOSIS — Z9181 History of falling: Secondary | ICD-10-CM

## 2023-08-21 DIAGNOSIS — N1831 Chronic kidney disease, stage 3a: Secondary | ICD-10-CM | POA: Diagnosis not present

## 2023-08-21 DIAGNOSIS — T148XXA Other injury of unspecified body region, initial encounter: Secondary | ICD-10-CM

## 2023-08-21 DIAGNOSIS — F0280 Dementia in other diseases classified elsewhere without behavioral disturbance: Secondary | ICD-10-CM | POA: Diagnosis not present

## 2023-08-21 DIAGNOSIS — F339 Major depressive disorder, recurrent, unspecified: Secondary | ICD-10-CM

## 2023-08-21 DIAGNOSIS — G301 Alzheimer's disease with late onset: Secondary | ICD-10-CM | POA: Diagnosis not present

## 2023-08-21 NOTE — Progress Notes (Unsigned)
Location:  [facility] Room Number: [id number] Place of Service:  SNF (31) Provider:  Preshanthi Veida Spira,MD  Mast, Man X, NP  Patient Care Team: Mast, Man X, NP as PCP - General (Internal Medicine) Romero Belling, MD as Referring Physician (Physical Medicine and Rehabilitation) Merlene Laughter, MD (Inactive) as Consulting Physician (Internal Medicine)  Extended Emergency Contact Information Primary Emergency Contact: Soyla Dryer, [place] Home Phone: [phone] Relation: Daughter Secondary Emergency Contact: Myers,Lois  [place] Home Phone: [phone] Relation: Daughter  Code Status: DNR  Goals of care: Advanced Directive information    08/21/2023    9:51 AM  Advanced Directives  Does Patient Have a Medical Advance Directive? Yes  Type of Estate agent of [place];Living will;Out of facility DNR (pink MOST or yellow form)  Does patient want to make changes to medical advance directive? No - Patient declined  Copy of Healthcare Power of Attorney in Chart? Yes - validated most recent copy scanned in chart (See row information)  Pre-existing out of facility DNR order (yellow form or pink MOST form) Pink MOST form placed in chart (order not valid for inpatient use);Yellow form placed in chart (order not valid for inpatient use)     Chief Complaint  Patient presents with  . Medical Management of Chronic Issues    Patient is being seen for a routine   . Immunizations    Discuss the need for shingles, covid and flu vaccine     HPI:  Pt is a 89 y.o. female seen today for medical management of chronic diseases. As per nursing staff , no behavioral problems, she needs 1 person assistance with all her ADLS, non ambulatory , she is able to propel herself for short distances with her wheelchair. No acute concerns as per nursing staff  Staff reports that pt required higher level of care  since her hospitalization for left femur fracture.    Pt seen and examined in her room, she  knows her name but not oriented to time or place She cannot  remember what she ate for lunch  Answers questions appropriately    Denies pain  Reports sleeping well No recent falls Wheel chair bound   Denies chest pain, SOB, abdominal pain, nausea, vomiting , dysuria, hematuria,  Reports being sad     Past Medical History:  Diagnosis Date  . BPV (benign positional vertigo)   . Cancer [facility])    uterine cancer  . Carpal tunnel syndrome on left 06/02/2017  . Cognitive changes   . Depression   . Droopy eyelid, right   . 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 Dyslipidemia   . Fibromyalgia   . Granuloma annulare   . History of uterine cancer 2003   treated with hysterectomy  . Lumbar radicular syndrome    left L5  . Macular degeneration    left eye  . Sciatica   . TIA (transient ischemic attack)    Past Surgical History:  Procedure Laterality Date  . ABDOMINAL HYSTERECTOMY    . BREAST BIOPSY  2007  . CATARACT EXTRACTION    . INTRAMEDULLARY (IM) NAIL INTERTROCHANTERIC Left 02/16/2023   Procedure: INTRAMEDULLARY (IM) NAIL INTERTROCHANTERIC;  Surgeon: London Sheer, MD;  Location: [facility];  Service: Orthopedics;  Laterality: Left;  . TONSILLECTOMY      Allergies  Allergen Reactions  . Aricept [Donepezil Hcl]     Muscle cramps  .  Latex Itching  . Tramadol Other (See Comments)    Pt can't remember what side effects she had, she doesn't take it now    Outpatient Encounter Medications as of 08/21/2023  Medication Sig  . acetaminophen (TYLENOL) 500 MG tablet Take 1,000 mg by mouth 2 (two) times daily.  Marland Kitchen aspirin EC 81 MG tablet Take 81 mg by mouth daily. Swallow whole.  . Calcium Carbonate-Vit D-Min (CALTRATE 600+D PLUS MINERALS) 600-800 MG-UNIT TABS Take 1 tablet by mouth daily.  . divalproex (DEPAKOTE) 125 MG DR tablet Take 125 mg by mouth daily. UNSPECIFIED DEMENTIA, UNSPECIFIED  SEVERITY, WITHOUT BEHAVIORAL DISTURBANCE, PSYCHOTIC DISTURBANCE, MOOD DISTURBANCE, AND ANXIETY (F03.90)  . FERROUS SULFATE PO Take 7.5 mLs by mouth daily.  . furosemide (LASIX) 20 MG tablet Take 20 mg by mouth every Monday, Wednesday, and Friday.  . lactose free nutrition (BOOST) LIQD Take 237 mLs by mouth as needed.  . melatonin 3 MG TABS tablet Take 3 mg by mouth at bedtime.  . memantine (NAMENDA) 5 MG tablet Take by mouth 2 (two) times daily.  . mirtazapine (REMERON) 30 MG tablet TAKE ONE TABLET AT BEDTIME.  . Multiple Vitamins-Minerals (SENTRY SENIOR) TABS Take 1 tablet by mouth daily.  . polyethylene glycol (MIRALAX) 17 g packet Take 17 g by mouth as needed.  . potassium chloride SA (KLOR-CON M) 20 MEQ tablet Take 20 mEq by mouth every Monday, Wednesday, and Friday.  . rivastigmine (EXELON) 4.6 mg/24hr Place 4.6 mg onto the skin daily.  Marland Kitchen senna (SENOKOT) 8.6 MG TABS tablet Take 1 tablet (8.6 mg total) by mouth at bedtime.  . sertraline (ZOLOFT) 50 MG tablet Take 1 tablet (50 mg total) by mouth daily.  . vitamin B-12 (CYANOCOBALAMIN) 500 MCG tablet Take 500 mcg by mouth daily.  . Amino Acids-Protein Hydrolys (PRO-STAT) LIQD Take 30 mLs by mouth daily.   No facility-administered encounter medications on file as of 08/21/2023.    Review of Systems  Unable to perform ROS: Dementia  Constitutional:  Negative for chills and fever.  HENT:  Negative for ear pain and rhinorrhea.   Respiratory:  Negative for cough and shortness of breath.   Cardiovascular:  Negative for chest pain.  Gastrointestinal:  Negative for abdominal pain, nausea and vomiting.  Genitourinary:  Negative for dysuria and hematuria.  Musculoskeletal:  Negative for arthralgias.  Neurological:  Negative for dizziness, weakness and numbness.  Psychiatric/Behavioral:  Negative for agitation and behavioral problems.     Immunization History  Administered Date(s) Administered  . Influenza, High Dose Seasonal PF 09/23/2017,  09/28/2019  . Influenza,inj,Quad PF,6+ Mos 09/16/2018  . Influenza-Unspecified 09/14/2017, 09/26/2020, 10/03/2021, 10/06/2022  . Moderna SARS-COV2 Booster Vaccination 10/23/2020, 05/14/2021  . Moderna Sars-Covid-2 Vaccination 12/19/2019, 01/16/2020, 05/14/2021  . Pneumococcal Conjugate-13 02/21/2014  . Pneumococcal Polysaccharide-23 02/21/2002  . Pneumococcal-Unspecified 12/15/2001  . Tdap 12/15/2009, 10/13/2022  . Unspecified SARS-COV-2 Vaccination 10/16/2022  . Zoster Recombinant(Shingrix) 12/15/2005  . Zoster, Unspecified 12/15/2005   Pertinent  Health Maintenance Due  Topic Date Due  . INFLUENZA VACCINE  07/16/2023  . DEXA SCAN  Completed      12/29/2018    4:11 PM 02/08/2019    1:54 PM 03/07/2020   12:54 PM 02/20/2023    2:52 PM 05/26/2023    9:34 AM  Fall Risk  Falls in the past year? 0 0 0 1 0  Was there an injury with Fall? 0 0  1 0  Fall Risk Category Calculator 0 0  3 0  Fall Risk Category (  Retired) Low Low     (RETIRED) Patient Fall Risk Level Low fall risk Low fall risk     Patient at Risk for Falls Due to    History of fall(s) Impaired balance/gait  Fall risk Follow up    Falls evaluation completed Falls evaluation completed   Functional Status Survey:    Vitals:   08/21/23 0947  BP: 138/60  Pulse: 80  Resp: 18  Temp: 97.7 F (36.5 C)  TempSrc: Temporal  SpO2: 95%  Weight: 158 lb 12.8 oz (72 kg)  Height: 5\' 1"  (1.549 m)   Body mass index is 30 kg/m. Physical Exam Constitutional:      Appearance: Normal appearance.  Cardiovascular:     Rate and Rhythm: Normal rate and regular rhythm.     Heart sounds: No murmur heard. Pulmonary:     Effort: No respiratory distress.     Breath sounds: No wheezing or rales.  Abdominal:     General: Bowel sounds are normal. There is no distension.     Palpations: Abdomen is soft.     Tenderness: There is no abdominal tenderness. There is no guarding.  Musculoskeletal:        General: No swelling.  Neurological:      Mental Status: She is alert. Mental status is at baseline.    Labs reviewed: Recent Labs    02/17/23 0352 02/18/23 0746 02/19/23 0937 02/24/23 0000 07/09/23 0000  NA 132* 134* 135 138 140  K 4.3 3.6 3.5 4.5 4.5  CL 103 103 106 107 106  CO2 24 24 22 21 18   GLUCOSE 157* 127* 110*  --   --   BUN 24* 24* 18 17 24*  CREATININE 1.29* 1.19* 0.97 1.0 1.2*  CALCIUM 7.6* 7.7* 7.7* 8.3* 9.2  MG  --   --  2.6*  --   --    Recent Labs    02/15/23 2058 02/19/23 0937 02/24/23 0000 04/09/23 0000 07/09/23 0000  AST 24 35 57* 21 17  ALT 21 13 41* 18 17  ALKPHOS 54 49 75 132* 66  BILITOT 0.5 0.9  --   --   --   PROT 7.0 5.7*  --   --   --   ALBUMIN 3.8 2.9* 3.5 4.1 4.0   Recent Labs    02/17/23 0701 02/17/23 1319 02/18/23 0746 02/19/23 0937 02/24/23 0000 06/02/23 0000 07/09/23 0000  WBC 9.9  --  10.1 10.2 11.1 6.8 6.5  NEUTROABS  --   --   --   --  8,181.00 4,313.00 3,959.00  HGB 6.7*   < > 7.4* 9.2* 9.7* 11.8* 11.7*  HCT 20.4*   < > 22.7* 27.7* 29* 35* 34*  MCV 100.5*  --  99.6 95.8  --   --   --   PLT 155  --  130* 153 359 282 253   < > = values in this interval not displayed.   Lab Results  Component Value Date   TSH 4.36 07/09/2023   No results found for: "HGBA1C" Lab Results  Component Value Date   CHOL 217 (A) 03/29/2021   HDL 48 03/29/2021   LDLCALC 152 03/29/2021   TRIG 70 03/29/2021   CHOLHDL 4.7 08/01/2019    Significant Diagnostic Results in last 30 days:  No results found.  Assessment/Plan   Late onset Alzheimer's disease without behavioral disturbance (HCC) Cont with supportive care Cont with rivastigmine, namenda  Stage 3a chronic kidney disease (HCC)  Latest Ref Rng & Units 07/09/2023   12:00 AM 02/24/2023   12:00 AM 02/19/2023    9:37 AM  BMP  Glucose 70 - 99 mg/dL   409   BUN 4 - 21 24     17     18    Creatinine 0.5 - 1.1 1.2     1.0     0.97   Sodium 137 - 147 140     138     135   Potassium 3.5 - 5.1 mEq/L 4.5     4.5     3.5    Chloride 99 - 108 106     107     106   CO2 13 - 22 18     21     22    Calcium 8.7 - 10.7 9.2     8.3     7.7      This result is from an external source.     Chronic diastolic CHF (congestive heart failure) (HCC) Cont with lasix Will check labs next week   Depression, recurrent (HCC)  Cont with rameron, zoloft  Iron deficiency anemia, unspecified iron deficiency anemia type No signs of bleeding Will check labs next week  Slow transit constipation- cont with colace

## 2023-08-23 ENCOUNTER — Encounter: Payer: Self-pay | Admitting: Sports Medicine

## 2023-08-25 ENCOUNTER — Non-Acute Institutional Stay (SKILLED_NURSING_FACILITY): Payer: Self-pay | Admitting: Nurse Practitioner

## 2023-08-25 ENCOUNTER — Encounter: Payer: Self-pay | Admitting: Nurse Practitioner

## 2023-08-25 DIAGNOSIS — F0390 Unspecified dementia without behavioral disturbance: Secondary | ICD-10-CM | POA: Diagnosis not present

## 2023-08-25 DIAGNOSIS — U071 COVID-19: Secondary | ICD-10-CM | POA: Diagnosis not present

## 2023-08-25 DIAGNOSIS — I10 Essential (primary) hypertension: Secondary | ICD-10-CM | POA: Diagnosis not present

## 2023-08-25 DIAGNOSIS — D509 Iron deficiency anemia, unspecified: Secondary | ICD-10-CM | POA: Diagnosis not present

## 2023-08-25 DIAGNOSIS — N1831 Chronic kidney disease, stage 3a: Secondary | ICD-10-CM

## 2023-08-25 DIAGNOSIS — I5032 Chronic diastolic (congestive) heart failure: Secondary | ICD-10-CM

## 2023-08-25 DIAGNOSIS — F339 Major depressive disorder, recurrent, unspecified: Secondary | ICD-10-CM

## 2023-08-25 LAB — BASIC METABOLIC PANEL
BUN: 22 — AB (ref 4–21)
CO2: 26 — AB (ref 13–22)
Chloride: 104 (ref 99–108)
Creatinine: 1 (ref 0.5–1.1)
Glucose: 102
Potassium: 4.2 mEq/L (ref 3.5–5.1)
Sodium: 139 (ref 137–147)

## 2023-08-25 LAB — COMPREHENSIVE METABOLIC PANEL: Calcium: 8.6 — AB (ref 8.7–10.7)

## 2023-08-25 NOTE — Assessment & Plan Note (Signed)
stable, on Mirtazapine, Sertraline, Depakote, TSH 4.36 07/09/23

## 2023-08-25 NOTE — Assessment & Plan Note (Addendum)
congestion, afebrile, no noted cough or SOB, denied chest pain, tested positive for COVID Prn Robitussin, Paxlovid if HPOA desires. eGFR 55

## 2023-08-25 NOTE — Assessment & Plan Note (Signed)
MMSE 19/30 12/22,  takes Exelon, Memantine, resides SNF [facility] for safety, care assistance. underwent Neurology consultation.

## 2023-08-25 NOTE — Assessment & Plan Note (Signed)
Post op anemia, transfused, on Fe,  Hgb 11. 7 07/09/23

## 2023-08-25 NOTE — Assessment & Plan Note (Signed)
The  patient's swelling ankles/BLE, mostly right, 08/04/22 negative DVT, BNP 54 07/09/23. Taking Furosemide.

## 2023-08-25 NOTE — Assessment & Plan Note (Signed)
08/25/23 Na 139, K 4.2, Bun 22, creat 1.04, eGFR 51

## 2023-08-25 NOTE — Progress Notes (Unsigned)
Location:   [facility] Room Number: 57 Place of Service:  SNF (31) Provider: Arna [place] NP  Milianna Ericsson X, NP  Patient Care Team: Julionna Marczak X, NP as PCP - General (Internal Medicine) Romero Belling, MD as Referring Physician (Physical Medicine and Rehabilitation) Merlene Laughter, MD (Inactive) as Consulting Physician (Internal Medicine)  Extended Emergency Contact Information Primary Emergency Contact: Soyla Dryer, [place] Home Phone: [phone] Relation: Daughter Secondary Emergency Contact: Myers,Lois  [place] Home Phone: [phone] Relation: Daughter  Code Status: DNR Goals of care: Advanced Directive information    08/25/2023    2:32 PM  Advanced Directives  Does Patient Have a Medical Advance Directive? Yes  Type of Estate agent of [place];Living will;Out of facility DNR (pink MOST or yellow form)  Does patient want to make changes to medical advance directive? No - Patient declined  Copy of Healthcare Power of Attorney in Chart? Yes - validated most recent copy scanned in chart (See row information)  Pre-existing out of facility DNR order (yellow form or pink MOST form) Pink MOST form placed in chart (order not valid for inpatient use);Yellow form placed in chart (order not valid for inpatient use)     Chief Complaint  Patient presents with   Acute Visit     congestion, positive COVID.    HPI:  Pt is a 89 y.o. female seen today for an acute visit for congestion, afebrile, no noted cough or SOB, denied chest pain, tested positive for COVID     left heel DTI, unstageable, healed.               Hospitalized 02/15/23-02/19/23 for left hip fx sustained from a ground fall, ORIF IM nail, healed. Taking Tylenol, Norco for pain. Alk phos 223 03/12/23>>132 04/09/23             R hip pain, X-ray R hip no acute fx or dislocation             Post op anemia, transfused, on Fe,  Hgb 11. 7  07/09/23 The  patient's swelling ankles/BLE, mostly right, 08/04/22 negative DVT, BNP 54 07/09/23. Taking Furosemide.  Hx of grade I diastolic dysfunction 2018 per echocardiogram due to SOB, 08/18/22 EF 75%, pulmonary hypertension, mild tricuspid/mitral valve insufficiency, on Furosemide, weight has been stable, 07/09/23 BNP 54 Depression/anxiety, stable, on Mirtazapine, Sertraline, Depakote, TSH 4.36 07/09/23 Hx of Hyperlipidemia, not taking statin, LDL 64 02/11/22, takes ASA 81mg  qd, refused statin. CKD Bun/creat 24/1.2 07/09/23, 22/1.04 08/25/23 Vit B12 deficiency, takes Vit B12, Hgb 11.7 07/09/23 Dementia, MMSE 19/30 12/22,  takes Exelon, Memantine, resides SNF [facility] for safety, care assistance. underwent Neurology consultation. Dyspnea, Hx of 01/08/2017, Grade I diastolic dysfunction 2018 per echocardiogram due to SOB. 08/18/22 Echo EF 75%, pulmonary hypertension, mild mitral/tricuspid valves insufficiency.                  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   Dyslipidemia    Fibromyalgia    Granuloma annulare    History of uterine cancer 2003   treated with hysterectomy   Lumbar radicular syndrome    left L5  Macular degeneration    left eye   Sciatica    TIA (transient ischemic attack)    Past Surgical History:  Procedure Laterality Date   ABDOMINAL HYSTERECTOMY     BREAST BIOPSY  2007   CATARACT EXTRACTION     INTRAMEDULLARY (IM) NAIL INTERTROCHANTERIC Left 02/16/2023   Procedure: INTRAMEDULLARY (IM) NAIL INTERTROCHANTERIC;  Surgeon: London Sheer, MD;  Location: [facility];  Service: Orthopedics;  Laterality: Left;   TONSILLECTOMY      Allergies  Allergen Reactions   Aricept [Donepezil Hcl]     Muscle cramps   Latex Itching   Tramadol Other (See Comments)    Pt can't  remember what side effects she had, she doesn't take it now    Allergies as of 08/25/2023       Reactions   Aricept [donepezil Hcl]    Muscle cramps   Latex Itching   Tramadol Other (See Comments)   Pt can't remember what side effects she had, she doesn't take it now        Medication List        Accurate as of August 25, 2023 11:59 PM. If you have any questions, ask your nurse or doctor.          acetaminophen 500 MG tablet Commonly known as: TYLENOL Take 1,000 mg by mouth 2 (two) times daily.   aspirin EC 81 MG tablet Take 81 mg by mouth daily. Swallow whole.   Caltrate 600+D Plus Minerals 600-800 MG-UNIT Tabs Take 1 tablet by mouth daily.   divalproex 125 MG DR tablet Commonly known as: DEPAKOTE Take 125 mg by mouth daily. UNSPECIFIED DEMENTIA, UNSPECIFIED SEVERITY, WITHOUT BEHAVIORAL DISTURBANCE, PSYCHOTIC DISTURBANCE, MOOD DISTURBANCE, AND ANXIETY (F03.90)   FERROUS SULFATE PO Take 7.5 mLs by mouth daily.   furosemide 20 MG tablet Commonly known as: LASIX Take 20 mg by mouth every Monday, Wednesday, and Friday.   guaiFENesin 200 MG/10ML Liqd Take 200 mg by mouth every 6 (six) hours as needed for cough. 10 ml by mouth every 6 hours as needed for 2 days   lactose free nutrition Liqd Take 237 mLs by mouth as needed.   melatonin 3 MG Tabs tablet Take 3 mg by mouth at bedtime.   memantine 5 MG tablet Commonly known as: NAMENDA Take by mouth 2 (two) times daily.   MiraLax 17 g packet Generic drug: polyethylene glycol Take 17 g by mouth as needed.   mirtazapine 30 MG tablet Commonly known as: REMERON TAKE ONE TABLET AT BEDTIME.   potassium chloride SA 20 MEQ tablet Commonly known as: KLOR-CON M Take 20 mEq by mouth every Monday, Wednesday, and Friday.   Pro-Stat Liqd Take 30 mLs by mouth daily.   rivastigmine 4.6 mg/24hr Commonly known as: EXELON Place 4.6 mg onto the skin daily.   senna 8.6 MG Tabs tablet Commonly known as: SENOKOT Take  1 tablet (8.6 mg total) by mouth at bedtime.   Sentry Senior Tabs Take 1 tablet by mouth daily.   sertraline 50 MG tablet Commonly known as: ZOLOFT Take 1 tablet (50 mg total) by mouth daily.   vitamin B-12 500 MCG tablet Commonly known as: CYANOCOBALAMIN Take 500 mcg by mouth daily.        Review of Systems  Constitutional:  Negative for fatigue, fever and unexpected weight change.  HENT:  Positive for congestion and hearing loss. Negative for sore throat and trouble swallowing.   Eyes:  Negative for visual disturbance.  Respiratory:  Negative for cough and shortness of breath.        ?DOE  Cardiovascular:  Positive for leg swelling.  Gastrointestinal:  Negative for abdominal pain and constipation.  Genitourinary:  Negative for dysuria.  Musculoskeletal:  Positive for arthralgias and gait problem.  Skin:  Negative for color change and wound.  Neurological:  Negative for speech difficulty and headaches.       Dementia  Psychiatric/Behavioral:  Negative for behavioral problems and sleep disturbance.        Resistance to assistance at times.     Immunization History  Administered Date(s) Administered   Influenza, High Dose Seasonal PF 09/23/2017, 09/28/2019   Influenza,inj,Quad PF,6+ Mos 09/16/2018   Influenza-Unspecified 09/14/2017, 09/26/2020, 10/03/2021, 10/06/2022   Moderna SARS-COV2 Booster Vaccination 10/23/2020, 05/14/2021   Moderna Sars-Covid-2 Vaccination 12/19/2019, 01/16/2020, 05/14/2021   Pneumococcal Conjugate-13 02/21/2014   Pneumococcal Polysaccharide-23 02/21/2002   Pneumococcal-Unspecified 12/15/2001   Tdap 12/15/2009, 10/13/2022   Unspecified SARS-COV-2 Vaccination 10/16/2022   Zoster Recombinant(Shingrix) 12/15/2005   Zoster, Unspecified 12/15/2005   Pertinent  Health Maintenance Due  Topic Date Due   INFLUENZA VACCINE  07/16/2023   DEXA SCAN  Completed      12/29/2018    4:11 PM 02/08/2019    1:54 PM 03/07/2020   12:54 PM 02/20/2023    2:52 PM  05/26/2023    9:34 AM  Fall Risk  Falls in the past year? 0 0 0 1 0  Was there an injury with Fall? 0 0  1 0  Fall Risk Category Calculator 0 0  3 0  Fall Risk Category (Retired) Low Low     (RETIRED) Patient Fall Risk Level Low fall risk Low fall risk     Patient at Risk for Falls Due to    History of fall(s) Impaired balance/gait  Fall risk Follow up    Falls evaluation completed Falls evaluation completed   Functional Status Survey:    Vitals:   08/25/23 1421  BP: 127/69  Pulse: 78  Resp: 18  Temp: 97.7 F (36.5 C)  SpO2: 95%  Weight: 158 lb 12.8 oz (72 kg)  Height: 5\' 1"  (1.549 m)   Body mass index is 30 kg/m. Physical Exam Vitals and nursing note reviewed.  Constitutional:      Appearance: Normal appearance.  HENT:     Head: Normocephalic and atraumatic.     Nose: Congestion and rhinorrhea present.     Mouth/Throat:     Mouth: Mucous membranes are moist.     Pharynx: No posterior oropharyngeal erythema.  Eyes:     Extraocular Movements: Extraocular movements intact.     Conjunctiva/sclera: Conjunctivae normal.     Pupils: Pupils are equal, round, and reactive to light.  Cardiovascular:     Rate and Rhythm: Normal rate and regular rhythm.     Heart sounds: No murmur heard. Pulmonary:     Effort: Pulmonary effort is normal.     Breath sounds: No rales.  Abdominal:     General: Bowel sounds are normal.     Palpations: Abdomen is soft.     Tenderness: There is no abdominal tenderness.  Musculoskeletal:     Cervical back: Normal range of motion and neck supple.     Right lower leg: Edema present.     Left lower leg: Edema present.     Comments: Trace edema BLE  Skin:    General: Skin is warm and dry.     Comments: Left hip surgical incision healed.  the left heel DTI, unstageable, slow healing, no s/s of infection  Neurological:     General: No focal deficit present.     Mental Status: She is alert. Mental status is at baseline.     Motor: No weakness.      Coordination: Coordination normal.     Gait: Gait abnormal.     Comments: Oriented to person, place.   Psychiatric:        Mood and Affect: Mood normal.        Behavior: Behavior normal.     Labs reviewed: Recent Labs    02/17/23 0352 02/18/23 0746 02/19/23 0937 02/24/23 0000 07/09/23 0000  NA 132* 134* 135 138 140  K 4.3 3.6 3.5 4.5 4.5  CL 103 103 106 107 106  CO2 24 24 22 21 18   GLUCOSE 157* 127* 110*  --   --   BUN 24* 24* 18 17 24*  CREATININE 1.29* 1.19* 0.97 1.0 1.2*  CALCIUM 7.6* 7.7* 7.7* 8.3* 9.2  MG  --   --  2.6*  --   --    Recent Labs    02/15/23 2058 02/19/23 0937 02/24/23 0000 04/09/23 0000 07/09/23 0000  AST 24 35 57* 21 17  ALT 21 13 41* 18 17  ALKPHOS 54 49 75 132* 66  BILITOT 0.5 0.9  --   --   --   PROT 7.0 5.7*  --   --   --   ALBUMIN 3.8 2.9* 3.5 4.1 4.0   Recent Labs    02/17/23 0701 02/17/23 1319 02/18/23 0746 02/19/23 0937 02/24/23 0000 06/02/23 0000 07/09/23 0000  WBC 9.9  --  10.1 10.2 11.1 6.8 6.5  NEUTROABS  --   --   --   --  8,181.00 4,313.00 3,959.00  HGB 6.7*   < > 7.4* 9.2* 9.7* 11.8* 11.7*  HCT 20.4*   < > 22.7* 27.7* 29* 35* 34*  MCV 100.5*  --  99.6 95.8  --   --   --   PLT 155  --  130* 153 359 282 253   < > = values in this interval not displayed.   Lab Results  Component Value Date   TSH 4.36 07/09/2023   No results found for: "HGBA1C" Lab Results  Component Value Date   CHOL 217 (A) 03/29/2021   HDL 48 03/29/2021   LDLCALC 152 03/29/2021   TRIG 70 03/29/2021   CHOLHDL 4.7 08/01/2019    Significant Diagnostic Results in last 30 days:  No results found.  Assessment/Plan: COVID-19 virus infection congestion, afebrile, no noted cough or SOB, denied chest pain, tested positive for COVID Prn Robitussin, Paxlovid if HPOA desires. eGFR 55    Iron deficiency anemia Post op anemia, transfused, on Fe,  Hgb 11. 7 07/09/23  Chronic diastolic CHF (congestive heart failure) (HCC) The  patient's swelling  ankles/BLE, mostly right, 08/04/22 negative DVT, BNP 54 07/09/23. Taking Furosemide.   Depression, recurrent (HCC) stable, on Mirtazapine, Sertraline, Depakote, TSH 4.36 07/09/23  Senile dementia (HCC) MMSE 19/30 12/22,  takes Exelon, Memantine, resides SNF [facility] for safety, care assistance. underwent Neurology consultation.  Stage 3a chronic kidney disease (CKD) (HCC) 08/25/23 Na 139, K 4.2, Bun 22, creat 1.04, eGFR 51    Family/ staff Communication: plan of care reviewed with the patient and charge nurse.   Labs/tests ordered:  none  Time spend 35 minutes.

## 2023-08-27 ENCOUNTER — Ambulatory Visit: Payer: Medicare PPO | Admitting: Orthopedic Surgery

## 2023-08-27 ENCOUNTER — Encounter: Payer: Self-pay | Admitting: Nurse Practitioner

## 2023-09-08 DIAGNOSIS — L602 Onychogryphosis: Secondary | ICD-10-CM | POA: Diagnosis not present

## 2023-09-08 DIAGNOSIS — M79671 Pain in right foot: Secondary | ICD-10-CM | POA: Diagnosis not present

## 2023-09-08 DIAGNOSIS — L89892 Pressure ulcer of other site, stage 2: Secondary | ICD-10-CM | POA: Diagnosis not present

## 2023-09-08 DIAGNOSIS — M79672 Pain in left foot: Secondary | ICD-10-CM | POA: Diagnosis not present

## 2023-09-10 ENCOUNTER — Encounter: Payer: Self-pay | Admitting: Nurse Practitioner

## 2023-09-10 ENCOUNTER — Non-Acute Institutional Stay (SKILLED_NURSING_FACILITY): Payer: Medicare PPO | Admitting: Nurse Practitioner

## 2023-09-10 DIAGNOSIS — F339 Major depressive disorder, recurrent, unspecified: Secondary | ICD-10-CM

## 2023-09-10 DIAGNOSIS — I5189 Other ill-defined heart diseases: Secondary | ICD-10-CM

## 2023-09-10 DIAGNOSIS — N1831 Chronic kidney disease, stage 3a: Secondary | ICD-10-CM | POA: Diagnosis not present

## 2023-09-10 DIAGNOSIS — F0390 Unspecified dementia without behavioral disturbance: Secondary | ICD-10-CM

## 2023-09-10 NOTE — Progress Notes (Signed)
Location:  [facility] Room Number: [id number] Place of Service:  SNF (31) Provider:  Janny Crute X, NP  Patient Care Team: Liron Eissler X, NP as PCP - General (Internal Medicine) Romero Belling, MD as Referring Physician (Physical Medicine and Rehabilitation) Merlene Laughter, MD (Inactive) as Consulting Physician (Internal Medicine)  Extended Emergency Contact Information Primary Emergency Contact: Soyla Dryer, [place] Home Phone: [phone] Relation: Daughter Secondary Emergency Contact: Myers,Lois  [place] Home Phone: [phone] Relation: Daughter  Code Status:  DNR Goals of care: Advanced Directive information    09/10/2023    9:30 AM  Advanced Directives  Does Patient Have a Medical Advance Directive? Yes  Type of Estate agent of [place];Living will;Out of facility DNR (pink MOST or yellow form)  Does patient want to make changes to medical advance directive? No - Patient declined  Copy of Healthcare Power of Attorney in Chart? Yes - validated most recent copy scanned in chart (See row information)  Pre-existing out of facility DNR order (yellow form or pink MOST form) Pink MOST form placed in chart (order not valid for inpatient use);Yellow form placed in chart (order not valid for inpatient use)     Chief Complaint  Patient presents with   Medical Management of Chronic Issues    Routine visit. Discuss need for flu vaccine and covid booster.     HPI:  Pt is a 89 y.o. female seen today for medical management of chronic diseases.                   Hospitalized 02/15/23-02/19/23 for left hip fx sustained from a ground fall, ORIF IM nail, healed.              R hip pain, X-ray R hip no acute fx or dislocation             Post op anemia, transfused, on Fe,  Hgb 11. 7 07/09/23 The  patient's swelling ankles/BLE, mostly right, 08/04/22 negative DVT, BNP 54 07/09/23. Taking  Furosemide. Bun/creat 22/1.0 08/25/23 Hx of grade I diastolic dysfunction 2018 per echocardiogram due to SOB, 08/18/22 EF 75%, pulmonary hypertension, mild tricuspid/mitral valve insufficiency, on Furosemide, weight has been stable, 07/09/23 BNP 54 Depression/anxiety, stable, on Mirtazapine(07/08/23 HPOA declined GDR), Sertraline, Depakote, TSH 4.36 07/09/23 Hx of Hyperlipidemia, not taking statin, LDL 64 02/11/22, takes ASA 81mg  qd, refused statin. CKD Bun/creat 24/1.2 07/09/23, 22/1.04 08/25/23 Vit B12 deficiency, takes Vit B12, Hgb 11.7 07/09/23 Dementia, MMSE 19/30 12/22,  takes Exelon, Memantine, resides SNF [facility] for safety, care assistance. underwent Neurology consultation. Dyspnea, Hx of 01/08/2017, Grade I diastolic dysfunction 2018 per echocardiogram due to SOB. 08/18/22 Echo EF 75%, pulmonary hypertension, mild mitral/tricuspid valves insufficiency.                  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   Dyslipidemia    Fibromyalgia    Granuloma annulare    History of uterine cancer 2003   treated with hysterectomy   Lumbar radicular syndrome    left L5   Macular degeneration    left eye   Sciatica  TIA (transient ischemic attack)    Past Surgical History:  Procedure Laterality Date   ABDOMINAL HYSTERECTOMY     BREAST BIOPSY  2007   CATARACT EXTRACTION     INTRAMEDULLARY (IM) NAIL INTERTROCHANTERIC Left 02/16/2023   Procedure: INTRAMEDULLARY (IM) NAIL INTERTROCHANTERIC;  Surgeon: London Sheer, MD;  Location: [facility];  Service: Orthopedics;  Laterality: Left;   TONSILLECTOMY      Allergies  Allergen Reactions   Aricept [Donepezil Hcl]     Muscle cramps   Latex Itching   Tramadol Other (See Comments)    Pt can't remember what side effects she had, she doesn't take it  now    Outpatient Encounter Medications as of 09/10/2023  Medication Sig   acetaminophen (TYLENOL) 500 MG tablet Take 1,000 mg by mouth 2 (two) times daily.   Amino Acids-Protein Hydrolys (PRO-STAT) LIQD Take 30 mLs by mouth daily.   aspirin EC 81 MG tablet Take 81 mg by mouth daily. Swallow whole.   Calcium Carbonate-Vit D-Min (CALTRATE 600+D PLUS MINERALS) 600-800 MG-UNIT TABS Take 1 tablet by mouth daily.   divalproex (DEPAKOTE) 125 MG DR tablet Take 125 mg by mouth daily. UNSPECIFIED DEMENTIA, UNSPECIFIED SEVERITY, WITHOUT BEHAVIORAL DISTURBANCE, PSYCHOTIC DISTURBANCE, MOOD DISTURBANCE, AND ANXIETY (F03.90)   FERROUS SULFATE PO Take 7.5 mLs by mouth daily.   furosemide (LASIX) 20 MG tablet Take 20 mg by mouth every Monday, Wednesday, and Friday.   lactose free nutrition (BOOST) LIQD Take 237 mLs by mouth as needed.   melatonin 3 MG TABS tablet Take 3 mg by mouth at bedtime.   memantine (NAMENDA) 5 MG tablet Take by mouth 2 (two) times daily.   mirtazapine (REMERON) 30 MG tablet TAKE ONE TABLET AT BEDTIME.   Multiple Vitamins-Minerals (SENTRY SENIOR) TABS Take 1 tablet by mouth daily.   polyethylene glycol (MIRALAX) 17 g packet Take 17 g by mouth as needed.   potassium chloride SA (KLOR-CON M) 20 MEQ tablet Take 20 mEq by mouth every Monday, Wednesday, and Friday.   rivastigmine (EXELON) 4.6 mg/24hr Place 4.6 mg onto the skin daily.   senna (SENOKOT) 8.6 MG TABS tablet Take 1 tablet (8.6 mg total) by mouth at bedtime.   sertraline (ZOLOFT) 50 MG tablet Take 1 tablet (50 mg total) by mouth daily.   vitamin B-12 (CYANOCOBALAMIN) 500 MCG tablet Take 500 mcg by mouth daily.   [DISCONTINUED] guaiFENesin 200 MG/10ML LIQD Take 200 mg by mouth every 6 (six) hours as needed for cough. 10 ml by mouth every 6 hours as needed for 2 days   No facility-administered encounter medications on file as of 09/10/2023.    Review of Systems  Constitutional:  Negative for appetite change and fever.  HENT:   Positive for hearing loss. Negative for congestion and trouble swallowing.   Eyes:  Negative for visual disturbance.  Respiratory:  Negative for cough and shortness of breath.        ?DOE  Cardiovascular:  Positive for leg swelling.  Gastrointestinal:  Negative for abdominal pain and constipation.  Genitourinary:  Negative for dysuria.  Musculoskeletal:  Positive for arthralgias and gait problem.  Skin:  Negative for color change and wound.  Neurological:  Negative for speech difficulty and headaches.       Dementia  Psychiatric/Behavioral:  Negative for behavioral problems and sleep disturbance.        Resistance to assistance at times.     Immunization History  Administered Date(s) Administered   Influenza, High Dose Seasonal PF 09/23/2017,  09/28/2019   Influenza,inj,Quad PF,6+ Mos 09/16/2018   Influenza-Unspecified 09/14/2017, 09/26/2020, 10/03/2021, 10/06/2022   Moderna SARS-COV2 Booster Vaccination 10/23/2020, 05/14/2021   Moderna Sars-Covid-2 Vaccination 12/19/2019, 01/16/2020, 05/14/2021   Pneumococcal Conjugate-13 02/21/2014   Pneumococcal Polysaccharide-23 02/21/2002   Pneumococcal-Unspecified 12/15/2001   Tdap 12/15/2009, 10/13/2022   Unspecified SARS-COV-2 Vaccination 10/16/2022   Zoster Recombinant(Shingrix) 01/12/2023, 04/15/2023   Zoster, Unspecified 12/15/2005   Pertinent  Health Maintenance Due  Topic Date Due   INFLUENZA VACCINE  07/16/2023   DEXA SCAN  Completed      12/29/2018    4:11 PM 02/08/2019    1:54 PM 03/07/2020   12:54 PM 02/20/2023    2:52 PM 05/26/2023    9:34 AM  Fall Risk  Falls in the past year? 0 0 0 1 0  Was there an injury with Fall? 0 0  1 0  Fall Risk Category Calculator 0 0  3 0  Fall Risk Category (Retired) Low Low     (RETIRED) Patient Fall Risk Level Low fall risk Low fall risk     Patient at Risk for Falls Due to    History of fall(s) Impaired balance/gait  Fall risk Follow up    Falls evaluation completed Falls evaluation  completed   Functional Status Survey:    Vitals:   09/10/23 0914  BP: 115/60  Pulse: 78  Temp: 97.6 F (36.4 C)  SpO2: 91%  Weight: 158 lb (71.7 kg)  Height: 5\' 1"  (1.549 m)   Body mass index is 29.85 kg/m. Physical Exam Vitals and nursing note reviewed.  Constitutional:      Appearance: Normal appearance.  HENT:     Head: Normocephalic and atraumatic.     Nose: Nose normal.     Mouth/Throat:     Mouth: Mucous membranes are moist.     Pharynx: No posterior oropharyngeal erythema.  Eyes:     Extraocular Movements: Extraocular movements intact.     Conjunctiva/sclera: Conjunctivae normal.     Pupils: Pupils are equal, round, and reactive to light.  Cardiovascular:     Rate and Rhythm: Normal rate and regular rhythm.     Heart sounds: No murmur heard. Pulmonary:     Effort: Pulmonary effort is normal.     Breath sounds: No rales.  Abdominal:     General: Bowel sounds are normal.     Palpations: Abdomen is soft.     Tenderness: There is no abdominal tenderness.  Musculoskeletal:     Cervical back: Normal range of motion and neck supple.     Right lower leg: Edema present.     Left lower leg: Edema present.     Comments: Trace edema BLE  Skin:    General: Skin is warm and dry.     Comments: Left hip surgical incision healed. the left heel DTI, unstageable, slow healing, no s/s of infection  Neurological:     General: No focal deficit present.     Mental Status: She is alert. Mental status is at baseline.     Gait: Gait abnormal.     Comments: Oriented to person, place.   Psychiatric:        Mood and Affect: Mood normal.        Behavior: Behavior normal.     Labs reviewed: Recent Labs    02/17/23 0352 02/18/23 0746 02/19/23 0937 02/24/23 0000 07/09/23 0000 08/25/23 0000  NA 132* 134* 135 138 140 139  K 4.3 3.6 3.5 4.5 4.5 4.2  CL 103 103 106 107 106 104  CO2 24 24 22 21 18  26*  GLUCOSE 157* 127* 110*  --   --   --   BUN 24* 24* 18 17 24* 22*   CREATININE 1.29* 1.19* 0.97 1.0 1.2* 1.0  CALCIUM 7.6* 7.7* 7.7* 8.3* 9.2 8.6*  MG  --   --  2.6*  --   --   --    Recent Labs    02/15/23 2058 02/19/23 0937 02/24/23 0000 04/09/23 0000 07/09/23 0000  AST 24 35 57* 21 17  ALT 21 13 41* 18 17  ALKPHOS 54 49 75 132* 66  BILITOT 0.5 0.9  --   --   --   PROT 7.0 5.7*  --   --   --   ALBUMIN 3.8 2.9* 3.5 4.1 4.0   Recent Labs    02/17/23 0701 02/17/23 1319 02/18/23 0746 02/19/23 0937 02/24/23 0000 06/02/23 0000 07/09/23 0000  WBC 9.9  --  10.1 10.2 11.1 6.8 6.5  NEUTROABS  --   --   --   --  8,181.00 4,313.00 3,959.00  HGB 6.7*   < > 7.4* 9.2* 9.7* 11.8* 11.7*  HCT 20.4*   < > 22.7* 27.7* 29* 35* 34*  MCV 100.5*  --  99.6 95.8  --   --   --   PLT 155  --  130* 153 359 282 253   < > = values in this interval not displayed.   Lab Results  Component Value Date   TSH 4.36 07/09/2023   No results found for: "HGBA1C" Lab Results  Component Value Date   CHOL 217 (A) 03/29/2021   HDL 48 03/29/2021   LDLCALC 152 03/29/2021   TRIG 70 03/29/2021   CHOLHDL 4.7 08/01/2019    Significant Diagnostic Results in last 30 days:  No results found.  Assessment/Plan Grade I diastolic dysfunction The  patient's swelling ankles/BLE, mostly right, 08/04/22 negative DVT, BNP 54 07/09/23. Taking Furosemide. Bun/creat 22/1.0 08/25/23 Hx of grade I diastolic dysfunction 2018 per echocardiogram due to SOB, 08/18/22 EF 75%, pulmonary hypertension, mild tricuspid/mitral valve insufficiency, on Furosemide, weight has been stable, 07/09/23 BNP 54  Depression, recurrent (HCC) stable, on Mirtazapine(07/08/23 HPOA declined GDR), Sertraline, Depakote, TSH 4.36 07/09/23  Stage 3a chronic kidney disease (CKD) (HCC) (07/08/23 HPOA declined GDR)  Senile dementia (HCC) MMSE 19/30 12/22,  takes Exelon, Memantine, resides SNF [facility] for safety, care assistance. underwent Neurology consultation.     Family/ staff Communication: plan of care reviewed with the  patient, the patient's daughter, and charge nurse.   Labs/tests ordered: none  Time spend 25 minutes.

## 2023-09-10 NOTE — Assessment & Plan Note (Signed)
(  07/08/23 HPOA declined GDR)

## 2023-09-10 NOTE — Assessment & Plan Note (Signed)
stable, on Mirtazapine(07/08/23 HPOA declined GDR), Sertraline, Depakote, TSH 4.36 07/09/23

## 2023-09-10 NOTE — Assessment & Plan Note (Signed)
MMSE 19/30 12/22,  takes Exelon, Memantine, resides SNF [facility] for safety, care assistance. underwent Neurology consultation.

## 2023-09-10 NOTE — Assessment & Plan Note (Signed)
The  patient's swelling ankles/BLE, mostly right, 08/04/22 negative DVT, BNP 54 07/09/23. Taking Furosemide. Bun/creat 22/1.0 08/25/23 Hx of grade I diastolic dysfunction 2018 per echocardiogram due to SOB, 08/18/22 EF 75%, pulmonary hypertension, mild tricuspid/mitral valve insufficiency, on Furosemide, weight has been stable, 07/09/23 BNP 54

## 2023-09-11 DIAGNOSIS — F330 Major depressive disorder, recurrent, mild: Secondary | ICD-10-CM | POA: Diagnosis not present

## 2023-09-11 DIAGNOSIS — F03A0 Unspecified dementia, mild, without behavioral disturbance, psychotic disturbance, mood disturbance, and anxiety: Secondary | ICD-10-CM | POA: Diagnosis not present

## 2023-09-28 ENCOUNTER — Ambulatory Visit: Payer: Medicare PPO | Admitting: Orthopedic Surgery

## 2023-09-28 ENCOUNTER — Other Ambulatory Visit (INDEPENDENT_AMBULATORY_CARE_PROVIDER_SITE_OTHER): Payer: Medicare PPO

## 2023-09-28 DIAGNOSIS — S72142A Displaced intertrochanteric fracture of left femur, initial encounter for closed fracture: Secondary | ICD-10-CM

## 2023-09-28 NOTE — Progress Notes (Signed)
Orthopedic Surgery Post-operative Office Visit   Procedure: left hip cephalomedullary rodding Date of Surgery: 02/16/2023 (~6 months post-op)   Assessment: Patient is a 89 y.o. who is doing well after surgery     Plan: -Operative plans complete -No restrictions at this point -Ambulate as much as tolerated -Pain management: Tylenol -Return to office in 6 months, x-rays needed at next visit: AP/lateral left femur   ___________________________________________________________________________     Subjective: Patient is not having any pain in her hip.  She will ambulate with a walker, but at times she does not feel like it.  She has continued to work with physical therapy.  She has not noticed any redness or drainage around her incisions.  She has no complaints about her hip at this time.  Objective:   General: no acute distress, appropriate affect Neurologic: alert, answering questions intermittently, following commands Respiratory: unlabored breathing on room air Skin: incisions are well healed   MSK (LLE):               No pain with logroll Plantarflexes and dorsiflexes toes EHL/TA/GSC intact Sensation intact to light touch in sural, saphenous, tibial, deep peroneal, and superficial peroneal nerve distributions Foot warm and well perfused, palpable DP pulse   Imaging: X-rays of the left femur taken 09/28/2023 were independently reviewed today, showing comminuted peritrochanteric femur fracture and slight valgus alignment.  Interval callus formation is noted on today's films when compared to prior x-rays on 05/21/2023.  There is no lucency around the rod or screws.  No evidence of screw cut out.  No new fracture seen.    Patient name: Janet [name] Patient MRN: [id number] Date of visit: 09/28/23

## 2023-10-22 ENCOUNTER — Encounter: Payer: Self-pay | Admitting: Nurse Practitioner

## 2023-10-22 ENCOUNTER — Non-Acute Institutional Stay (SKILLED_NURSING_FACILITY): Payer: Medicare PPO | Admitting: Nurse Practitioner

## 2023-10-22 DIAGNOSIS — F0390 Unspecified dementia without behavioral disturbance: Secondary | ICD-10-CM | POA: Diagnosis not present

## 2023-10-22 DIAGNOSIS — N1831 Chronic kidney disease, stage 3a: Secondary | ICD-10-CM

## 2023-10-22 DIAGNOSIS — I5032 Chronic diastolic (congestive) heart failure: Secondary | ICD-10-CM

## 2023-10-22 DIAGNOSIS — F339 Major depressive disorder, recurrent, unspecified: Secondary | ICD-10-CM

## 2023-10-22 DIAGNOSIS — E538 Deficiency of other specified B group vitamins: Secondary | ICD-10-CM

## 2023-10-22 NOTE — Assessment & Plan Note (Signed)
stable, on Mirtazapine(07/08/23 HPOA declined GDR), Sertraline, Depakote, TSH 4.36 07/09/23

## 2023-10-22 NOTE — Assessment & Plan Note (Signed)
takes Vit B12, Hgb 11.7 07/09/23

## 2023-10-22 NOTE — Assessment & Plan Note (Signed)
Bun/creat 24/1.2 07/09/23, 22/1.04 08/25/23

## 2023-10-22 NOTE — Assessment & Plan Note (Signed)
MMSE 19/30 12/22,  takes Exelon, Memantine, resides SNF [facility] for safety, care assistance. underwent Neurology consultation.

## 2023-10-22 NOTE — Progress Notes (Unsigned)
Location:  [facility] Room Number: [phone] A Place of Service:  SNF (31) Provider:  Shafer Swamy X, NP  Patient Care Team: Hera Celaya X, NP as PCP - General (Internal Medicine) Romero Belling, MD as Referring Physician (Physical Medicine and Rehabilitation) Merlene Laughter, MD (Inactive) as Consulting Physician (Internal Medicine)  Extended Emergency Contact Information Primary Emergency Contact: Soyla Dryer, [place] Home Phone: [phone] Relation: Daughter Secondary Emergency Contact: Myers,Lois  [place] Home Phone: [phone] Relation: Daughter  Code Status:  DNR Goals of care: Advanced Directive information    10/22/2023   10:22 AM  Advanced Directives  Does Patient Have a Medical Advance Directive? Yes  Type of Estate agent of [place];Living will;Out of facility DNR (pink MOST or yellow form)  Does patient want to make changes to medical advance directive? No - Patient declined  Copy of Healthcare Power of Attorney in Chart? Yes - validated most recent copy scanned in chart (See row information)  Pre-existing out of facility DNR order (yellow form or pink MOST form) Pink MOST form placed in chart (order not valid for inpatient use);Yellow form placed in chart (order not valid for inpatient use)     Chief Complaint  Patient presents with  . Medical Management of Chronic Issues    Routine visit. Discuss need for covid booster.     HPI:  Pt is a 89 y.o. female seen today for medical management of chronic diseases.     Hospitalized 02/15/23-02/19/23 for left hip fx sustained from a ground fall, ORIF IM nail, healed. Last f/u 09/28/23             R hip pain, X-ray R hip no acute fx or dislocation             Post op anemia, transfused, on Fe,  Hgb 11. 7 07/09/23 The  patient's swelling ankles/BLE, mostly right, 08/04/22 negative DVT, BNP 54 07/09/23. Taking Furosemide. Bun/creat  22/1.0 08/25/23 Hx of grade I diastolic dysfunction 2018 per echocardiogram due to SOB, 08/18/22 EF 75%, pulmonary hypertension, mild tricuspid/mitral valve insufficiency, on Furosemide, weight has been stable, 07/09/23 BNP 54 Depression/anxiety, stable, on Mirtazapine(07/08/23 HPOA declined GDR), Sertraline, Depakote, TSH 4.36 07/09/23 Hx of Hyperlipidemia, not taking statin, LDL 64 02/11/22, takes ASA 81mg  qd, refused statin. CKD Bun/creat 24/1.2 07/09/23, 22/1.04 08/25/23 Vit B12 deficiency, takes Vit B12, Hgb 11.7 07/09/23 Dementia, MMSE 19/30 12/22,  takes Exelon, Memantine, resides SNF [facility] for safety, care assistance. underwent Neurology consultation. Dyspnea, Hx of 01/08/2017, Grade I diastolic dysfunction 2018 per echocardiogram due to SOB. 08/18/22 Echo EF 75%, pulmonary hypertension, mild mitral/tricuspid valves insufficiency.                  Past Medical History:  Diagnosis Date  . BPV (benign positional vertigo)   . Cancer [facility])    uterine cancer  . Carpal tunnel syndrome on left 06/02/2017  . Cognitive changes   . Depression   . Droopy eyelid, right   . 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 Dyslipidemia   . Fibromyalgia   . Granuloma annulare   . History of uterine cancer 2003   treated with hysterectomy  . Lumbar radicular syndrome    left L5  . Macular degeneration    left eye  . Sciatica   . TIA (transient ischemic attack)    Past Surgical History:  Procedure Laterality Date  .  ABDOMINAL HYSTERECTOMY    . BREAST BIOPSY  2007  . CATARACT EXTRACTION    . INTRAMEDULLARY (IM) NAIL INTERTROCHANTERIC Left 02/16/2023   Procedure: INTRAMEDULLARY (IM) NAIL INTERTROCHANTERIC;  Surgeon: London Sheer, MD;  Location: [facility] ORS;  Service: Orthopedics;  Laterality: Left;  . TONSILLECTOMY      Allergies  Allergen Reactions  . Aricept [Donepezil Hcl]     Muscle cramps  . Latex Itching  . Tramadol Other (See Comments)    Pt can't remember what side effects she had, she doesn't take it  now    Outpatient Encounter Medications as of 10/22/2023  Medication Sig  . acetaminophen (TYLENOL) 500 MG tablet Take 1,000 mg by mouth 2 (two) times daily.  . Amino Acids-Protein Hydrolys (PRO-STAT) LIQD Take 30 mLs by mouth daily.  Marland Kitchen aspirin EC 81 MG tablet Take 81 mg by mouth daily. Swallow whole.  . Calcium Carbonate-Vit D-Min (CALTRATE 600+D PLUS MINERALS) 600-800 MG-UNIT TABS Take 1 tablet by mouth daily.  . divalproex (DEPAKOTE) 125 MG DR tablet Take 125 mg by mouth daily. UNSPECIFIED DEMENTIA, UNSPECIFIED SEVERITY, WITHOUT BEHAVIORAL DISTURBANCE, PSYCHOTIC DISTURBANCE, MOOD DISTURBANCE, AND ANXIETY (F03.90)  . FERROUS SULFATE PO Take 7.5 mLs by mouth daily.  . furosemide (LASIX) 20 MG tablet Take 20 mg by mouth every Monday, Wednesday, and Friday.  . lactose free nutrition (BOOST) LIQD Take 237 mLs by mouth as needed.  . melatonin 3 MG TABS tablet Take 3 mg by mouth at bedtime.  . memantine (NAMENDA) 5 MG tablet Take by mouth 2 (two) times daily.  . mirtazapine (REMERON) 30 MG tablet TAKE ONE TABLET AT BEDTIME.  . Multiple Vitamins-Minerals (SENTRY SENIOR) TABS Take 1 tablet by mouth daily.  . polyethylene glycol (MIRALAX) 17 g packet Take 17 g by mouth as needed.  . potassium chloride SA (KLOR-CON M) 20 MEQ tablet Take 20 mEq by mouth every Monday, Wednesday, and Friday.  . rivastigmine (EXELON) 4.6 mg/24hr Place 4.6 mg onto the skin daily.  Marland Kitchen senna (SENOKOT) 8.6 MG TABS tablet Take 1 tablet (8.6 mg total) by mouth at bedtime.  . sertraline (ZOLOFT) 50 MG tablet Take 1 tablet (50 mg total) by mouth daily.  . vitamin B-12 (CYANOCOBALAMIN) 500 MCG tablet Take 500 mcg by mouth daily.   No facility-administered encounter medications on file as of 10/22/2023.    Review of Systems  Constitutional:  Negative for appetite change and fever.  HENT:  Positive for hearing loss. Negative for congestion and trouble swallowing.   Eyes:  Negative for visual disturbance.  Respiratory:  Negative  for cough and shortness of breath.        ?DOE  Cardiovascular:  Positive for leg swelling.  Gastrointestinal:  Negative for abdominal pain and constipation.  Genitourinary:  Negative for dysuria.  Musculoskeletal:  Positive for arthralgias and gait problem.  Skin:  Negative for color change and wound.  Neurological:  Negative for speech difficulty and headaches.       Dementia  Psychiatric/Behavioral:  Negative for behavioral problems and sleep disturbance.        Resistance to assistance at times.     Immunization History  Administered Date(s) Administered  . Influenza, High Dose Seasonal PF 09/23/2017, 09/28/2019, 10/08/2023  . Influenza,inj,Quad PF,6+ Mos 09/16/2018  . Influenza-Unspecified 09/14/2017, 09/26/2020, 10/03/2021, 10/06/2022  . Moderna SARS-COV2 Booster Vaccination 10/23/2020, 05/14/2021  . Moderna Sars-Covid-2 Vaccination 12/19/2019, 01/16/2020, 05/14/2021  . Pneumococcal Conjugate-13 02/21/2014  . Pneumococcal Polysaccharide-23 02/21/2002  . Pneumococcal-Unspecified 12/15/2001  . Tdap  12/15/2009, 10/13/2022  . Unspecified SARS-COV-2 Vaccination 10/16/2022  . Zoster Recombinant(Shingrix) 01/12/2023, 04/15/2023  . Zoster, Unspecified 12/15/2005   Pertinent  Health Maintenance Due  Topic Date Due  . INFLUENZA VACCINE  Completed  . DEXA SCAN  Completed      12/29/2018    4:11 PM 02/08/2019    1:54 PM 03/07/2020   12:54 PM 02/20/2023    2:52 PM 05/26/2023    9:34 AM  Fall Risk  Falls in the past year? 0 0 0 1 0  Was there an injury with Fall? 0 0  1 0  Fall Risk Category Calculator 0 0  3 0  Fall Risk Category (Retired) Low Low     (RETIRED) Patient Fall Risk Level Low fall risk Low fall risk     Patient at Risk for Falls Due to    History of fall(s) Impaired balance/gait  Fall risk Follow up    Falls evaluation completed Falls evaluation completed   Functional Status Survey:    Vitals:   10/22/23 1021  BP: 126/64  Pulse: 97  SpO2: 94%  Weight: 160 lb  3.2 oz (72.7 kg)  Height: 5\' 1"  (1.549 m)   Body mass index is 30.27 kg/m. Physical Exam Vitals and nursing note reviewed.  Constitutional:      Appearance: Normal appearance.  HENT:     Head: Normocephalic and atraumatic.     Nose: Nose normal.     Mouth/Throat:     Mouth: Mucous membranes are moist.     Pharynx: No posterior oropharyngeal erythema.  Eyes:     Extraocular Movements: Extraocular movements intact.     Conjunctiva/sclera: Conjunctivae normal.     Pupils: Pupils are equal, round, and reactive to light.  Cardiovascular:     Rate and Rhythm: Normal rate and regular rhythm.     Heart sounds: No murmur heard. Pulmonary:     Effort: Pulmonary effort is normal.     Breath sounds: No rales.  Abdominal:     General: Bowel sounds are normal.     Palpations: Abdomen is soft.     Tenderness: There is no abdominal tenderness.  Musculoskeletal:     Cervical back: Normal range of motion and neck supple.     Right lower leg: Edema present.     Left lower leg: Edema present.     Comments: Trace edema BLE  Skin:    General: Skin is warm and dry.     Comments: Left hip surgical incision healed. the left heel DTI, unstageable, slow healing, no s/s of infection  Neurological:     General: No focal deficit present.     Mental Status: She is alert. Mental status is at baseline.     Gait: Gait abnormal.     Comments: Oriented to person, place.   Psychiatric:        Mood and Affect: Mood normal.        Behavior: Behavior normal.    Labs reviewed: Recent Labs    02/17/23 0352 02/18/23 0746 02/19/23 0937 02/24/23 0000 07/09/23 0000 08/25/23 0000  NA 132* 134* 135 138 140 139  K 4.3 3.6 3.5 4.5 4.5 4.2  CL 103 103 106 107 106 104  CO2 24 24 22 21 18  26*  GLUCOSE 157* 127* 110*  --   --   --   BUN 24* 24* 18 17 24* 22*  CREATININE 1.29* 1.19* 0.97 1.0 1.2* 1.0  CALCIUM 7.6* 7.7* 7.7* 8.3* 9.2  8.6*  MG  --   --  2.6*  --   --   --    Recent Labs    02/15/23 2058  02/19/23 0937 02/24/23 0000 04/09/23 0000 07/09/23 0000  AST 24 35 57* 21 17  ALT 21 13 41* 18 17  ALKPHOS 54 49 75 132* 66  BILITOT 0.5 0.9  --   --   --   PROT 7.0 5.7*  --   --   --   ALBUMIN 3.8 2.9* 3.5 4.1 4.0   Recent Labs    02/17/23 0701 02/17/23 1319 02/18/23 0746 02/19/23 0937 02/24/23 0000 06/02/23 0000 07/09/23 0000  WBC 9.9  --  10.1 10.2 11.1 6.8 6.5  NEUTROABS  --   --   --   --  8,181.00 4,313.00 3,959.00  HGB 6.7*   < > 7.4* 9.2* 9.7* 11.8* 11.7*  HCT 20.4*   < > 22.7* 27.7* 29* 35* 34*  MCV 100.5*  --  99.6 95.8  --   --   --   PLT 155  --  130* 153 359 282 253   < > = values in this interval not displayed.   Lab Results  Component Value Date   TSH 4.36 07/09/2023   No results found for: "HGBA1C" Lab Results  Component Value Date   CHOL 217 (A) 03/29/2021   HDL 48 03/29/2021   LDLCALC 152 03/29/2021   TRIG 70 03/29/2021   CHOLHDL 4.7 08/01/2019    Significant Diagnostic Results in last 30 days:  XR FEMUR MIN 2 VIEWS LEFT  Result Date: 09/28/2023 X-rays of the left femur taken 09/28/2023 were independently reviewed today, showing comminuted peritrochanteric femur fracture and slight valgus alignment.  Interval callus formation is noted on today's films when compared to prior x-rays on 05/21/2023.  There is no lucency around the rod or screws.  No evidence of screw cut out.  No new fracture seen.   Assessment/Plan Stage 3a chronic kidney disease (CKD) (HCC) Bun/creat 24/1.2 07/09/23, 22/1.04 08/25/23  Vitamin B12 deficiency takes Vit B12, Hgb 11.7 07/09/23  Senile dementia (HCC) MMSE 19/30 12/22,  takes Exelon, Memantine, resides SNF [facility] for safety, care assistance. underwent Neurology consultation.  Chronic diastolic CHF (congestive heart failure) (HCC) Hx of 01/08/2017, Grade I diastolic dysfunction 2018 per echocardiogram due to SOB. 08/18/22 Echo EF 75%, pulmonary hypertension, mild mitral/tricuspid valves insufficiency.         The   patient's swelling ankles/BLE, mostly right, 08/04/22 negative DVT, BNP 54 07/09/23. Taking Furosemide. Bun/creat 22/1.0 08/25/23  Depression, recurrent (HCC) stable, on Mirtazapine(07/08/23 HPOA declined GDR), Sertraline, Depakote, TSH 4.36 07/09/23    Family/ staff Communication: plan of care reviewed with the patient and charge nurse.   Labs/tests ordered:  none  Time spend 30 minutes.

## 2023-10-22 NOTE — Assessment & Plan Note (Signed)
Hx of 01/08/2017, Grade I diastolic dysfunction 2018 per echocardiogram due to SOB. 08/18/22 Echo EF 75%, pulmonary hypertension, mild mitral/tricuspid valves insufficiency.         The  patient's swelling ankles/BLE, mostly right, 08/04/22 negative DVT, BNP 54 07/09/23. Taking Furosemide. Bun/creat 22/1.0 08/25/23

## 2023-10-29 ENCOUNTER — Encounter: Payer: Self-pay | Admitting: Nurse Practitioner

## 2023-10-29 ENCOUNTER — Non-Acute Institutional Stay (SKILLED_NURSING_FACILITY): Payer: Medicare PPO | Admitting: Nurse Practitioner

## 2023-10-29 DIAGNOSIS — F0390 Unspecified dementia without behavioral disturbance: Secondary | ICD-10-CM | POA: Diagnosis not present

## 2023-10-29 DIAGNOSIS — N1831 Chronic kidney disease, stage 3a: Secondary | ICD-10-CM

## 2023-10-29 DIAGNOSIS — F339 Major depressive disorder, recurrent, unspecified: Secondary | ICD-10-CM

## 2023-10-29 DIAGNOSIS — E538 Deficiency of other specified B group vitamins: Secondary | ICD-10-CM | POA: Diagnosis not present

## 2023-10-29 DIAGNOSIS — I5032 Chronic diastolic (congestive) heart failure: Secondary | ICD-10-CM | POA: Diagnosis not present

## 2023-10-29 NOTE — Progress Notes (Unsigned)
Location:  [facility] Room Number: 026-A Place of Service:  SNF (31) Provider:  Nassir Neidert,ManX,NP  Kaija Kovacevic X, NP  Patient Care Team: Zabella Wease X, NP as PCP - General (Internal Medicine) Romero Belling, MD as Referring Physician (Physical Medicine and Rehabilitation) Merlene Laughter, MD (Inactive) as Consulting Physician (Internal Medicine)  Extended Emergency Contact Information Primary Emergency Contact: Soyla Dryer, [place] Home Phone: [phone] Relation: Daughter Secondary Emergency Contact: Myers,Lois  [place] Home Phone: [phone] Relation: Daughter  Code Status:  DNR Goals of care: Advanced Directive information    10/29/2023   10:05 AM  Advanced Directives  Does Patient Have a Medical Advance Directive? Yes  Type of Estate agent of [place];Living will;Out of facility DNR (pink MOST or yellow form)  Does patient want to make changes to medical advance directive? No - Patient declined  Copy of Healthcare Power of Attorney in Chart? Yes - validated most recent copy scanned in chart (See row information)  Pre-existing out of facility DNR order (yellow form or pink MOST form) Pink MOST form placed in chart (order not valid for inpatient use);Yellow form placed in chart (order not valid for inpatient use)     Chief Complaint  Patient presents with   Acute Visit    Agitation and discuss covid vaccine.    HPI:  Pt is a 89 y.o. female seen today for an acute visit for anxious and combative when the patient was offered to use the toilet or to change briefs, HPOA desires medication adjustment.   Hospitalized 02/15/23-02/19/23 for left hip fx sustained from a ground fall, ORIF IM nail, healed. Last f/u 09/28/23             R hip pain, X-ray R hip no acute fx or dislocation             Post op anemia, transfused, on Fe,  Hgb 11. 7 07/09/23 The  patient's swelling ankles/BLE,  mostly right, 08/04/22 negative DVT, BNP 54 07/09/23. Taking Furosemide. Bun/creat 22/1.0 08/25/23 Hx of grade I diastolic dysfunction 2018 per echocardiogram due to SOB, 08/18/22 EF 75%, pulmonary hypertension, mild tricuspid/mitral valve insufficiency, on Furosemide, weight has been stable, 07/09/23 BNP 54 Depression/anxiety, on Mirtazapine(07/08/23 HPOA declined GDR), Sertraline, Depakote, TSH 4.36 07/09/23 Hx of Hyperlipidemia, not taking statin, LDL 64 02/11/22, takes ASA 81mg  qd, refused statin. CKD Bun/creat 22/1.04 08/25/23 Vit B12 deficiency, takes Vit B12, Fe Hgb 11.7 07/09/23 Dementia, MMSE 19/30 12/22,  takes Ex, elon, Memantine, resides SNF [facility] for safety, care assistance. underwent Neurology consultation. Dyspnea, Hx of 01/08/2017, Grade I diastolic dysfunction 2018 per echocardiogram due to SOB. 08/18/22 Echo EF 75%, pulmonary hypertension, mild mitral/tricuspid valves insufficiency.      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   Dyslipidemia    Fibromyalgia    Granuloma annulare    History of uterine cancer 2003   treated with hysterectomy   Lumbar radicular syndrome    left L5   Macular degeneration    left eye   Sciatica    TIA (transient ischemic attack)    Past Surgical History:  Procedure Laterality Date  ABDOMINAL HYSTERECTOMY     BREAST BIOPSY  2007   CATARACT EXTRACTION     INTRAMEDULLARY (IM) NAIL INTERTROCHANTERIC Left 02/16/2023   Procedure: INTRAMEDULLARY (IM) NAIL INTERTROCHANTERIC;  Surgeon: London Sheer, MD;  Location: [facility];  Service: Orthopedics;  Laterality: Left;   TONSILLECTOMY      Allergies  Allergen Reactions   Aricept [Donepezil Hcl]     Muscle cramps   Latex Itching   Tramadol Other (See Comments)    Pt can't remember what side effects she  had, she doesn't take it now    Outpatient Encounter Medications as of 10/29/2023  Medication Sig   acetaminophen (TYLENOL) 500 MG tablet Take 1,000 mg by mouth 2 (two) times daily.   Amino Acids-Protein Hydrolys (PRO-STAT) LIQD Take 30 mLs by mouth daily.   aspirin EC 81 MG tablet Take 81 mg by mouth daily. Swallow whole.   Calcium Carbonate-Vit D-Min (CALTRATE 600+D PLUS MINERALS) 600-800 MG-UNIT TABS Take 1 tablet by mouth daily.   divalproex (DEPAKOTE) 125 MG DR tablet Take 125 mg by mouth daily. UNSPECIFIED DEMENTIA, UNSPECIFIED SEVERITY, WITHOUT BEHAVIORAL DISTURBANCE, PSYCHOTIC DISTURBANCE, MOOD DISTURBANCE, AND ANXIETY (F03.90)   FERROUS SULFATE PO Take 7.5 mLs by mouth daily.   furosemide (LASIX) 20 MG tablet Take 20 mg by mouth every Monday, Wednesday, and Friday.   lactose free nutrition (BOOST) LIQD Take 237 mLs by mouth as needed.   melatonin 3 MG TABS tablet Take 3 mg by mouth at bedtime.   memantine (NAMENDA) 5 MG tablet Take by mouth 2 (two) times daily.   mirtazapine (REMERON) 30 MG tablet TAKE ONE TABLET AT BEDTIME.   Multiple Vitamins-Minerals (SENTRY SENIOR) TABS Take 1 tablet by mouth daily.   polyethylene glycol (MIRALAX) 17 g packet Take 17 g by mouth as needed.   potassium chloride SA (KLOR-CON M) 20 MEQ tablet Take 20 mEq by mouth every Monday, Wednesday, and Friday.   rivastigmine (EXELON) 4.6 mg/24hr Place 4.6 mg onto the skin daily.   senna (SENOKOT) 8.6 MG TABS tablet Take 1 tablet (8.6 mg total) by mouth at bedtime.   sertraline (ZOLOFT) 50 MG tablet Take 1 tablet (50 mg total) by mouth daily.   vitamin B-12 (CYANOCOBALAMIN) 500 MCG tablet Take 500 mcg by mouth daily.   No facility-administered encounter medications on file as of 10/29/2023.    Review of Systems  Unable to perform ROS: Dementia    Immunization History  Administered Date(s) Administered   Influenza, High Dose Seasonal PF 09/23/2017, 09/28/2019, 10/08/2023   Influenza,inj,Quad PF,6+ Mos  09/16/2018   Influenza-Unspecified 09/14/2017, 09/26/2020, 10/03/2021, 10/06/2022   Moderna SARS-COV2 Booster Vaccination 10/23/2020, 05/14/2021   Moderna Sars-Covid-2 Vaccination 12/19/2019, 01/16/2020, 05/14/2021   Pneumococcal Conjugate-13 02/21/2014   Pneumococcal Polysaccharide-23 02/21/2002   Pneumococcal-Unspecified 12/15/2001   Tdap 12/15/2009, 10/13/2022   Unspecified SARS-COV-2 Vaccination 10/16/2022   Zoster Recombinant(Shingrix) 01/12/2023, 04/15/2023   Zoster, Unspecified 12/15/2005   Pertinent  Health Maintenance Due  Topic Date Due   INFLUENZA VACCINE  Completed   DEXA SCAN  Completed      12/29/2018    4:11 PM 02/08/2019    1:54 PM 03/07/2020   12:54 PM 02/20/2023    2:52 PM 05/26/2023    9:34 AM  Fall Risk  Falls in the past year? 0 0 0 1 0  Was there an injury with Fall? 0 0  1 0  Fall Risk Category Calculator 0 0  3 0  Fall Risk Category (Retired) Low Low     (  RETIRED) Patient Fall Risk Level Low fall risk Low fall risk     Patient at Risk for Falls Due to    History of fall(s) Impaired balance/gait  Fall risk Follow up    Falls evaluation completed Falls evaluation completed   Functional Status Survey:    Vitals:   10/29/23 1002  BP: 126/70  Pulse: 83  Resp: 18  Temp: 97.7 F (36.5 C)  SpO2: 94%  Weight: 160 lb 8 oz (72.8 kg)  Height: 5\' 1"  (1.549 m)   Body mass index is 30.33 kg/m. Physical Exam Vitals and nursing note reviewed.  Constitutional:      Appearance: Normal appearance.  HENT:     Head: Normocephalic and atraumatic.     Nose: Nose normal.     Mouth/Throat:     Mouth: Mucous membranes are moist.     Pharynx: No posterior oropharyngeal erythema.  Eyes:     Extraocular Movements: Extraocular movements intact.     Conjunctiva/sclera: Conjunctivae normal.     Pupils: Pupils are equal, round, and reactive to light.  Cardiovascular:     Rate and Rhythm: Normal rate and regular rhythm.     Heart sounds: No murmur heard. Pulmonary:      Effort: Pulmonary effort is normal.     Breath sounds: No rales.  Abdominal:     General: Bowel sounds are normal.     Palpations: Abdomen is soft.     Tenderness: There is no abdominal tenderness.  Musculoskeletal:     Cervical back: Normal range of motion and neck supple.     Right lower leg: Edema present.     Left lower leg: Edema present.     Comments: Trace edema BLE  Skin:    General: Skin is warm and dry.     Comments: Left hip surgical incision healed.   Neurological:     General: No focal deficit present.     Mental Status: She is alert. Mental status is at baseline.     Gait: Gait abnormal.     Comments: Oriented to person  Psychiatric:     Comments: Anxious and combative when assistance with ADL offered.       Labs reviewed: Recent Labs    02/17/23 0352 02/18/23 0746 02/19/23 0937 02/24/23 0000 07/09/23 0000 08/25/23 0000  NA 132* 134* 135 138 140 139  K 4.3 3.6 3.5 4.5 4.5 4.2  CL 103 103 106 107 106 104  CO2 24 24 22 21 18  26*  GLUCOSE 157* 127* 110*  --   --   --   BUN 24* 24* 18 17 24* 22*  CREATININE 1.29* 1.19* 0.97 1.0 1.2* 1.0  CALCIUM 7.6* 7.7* 7.7* 8.3* 9.2 8.6*  MG  --   --  2.6*  --   --   --    Recent Labs    02/15/23 2058 02/19/23 0937 02/24/23 0000 04/09/23 0000 07/09/23 0000  AST 24 35 57* 21 17  ALT 21 13 41* 18 17  ALKPHOS 54 49 75 132* 66  BILITOT 0.5 0.9  --   --   --   PROT 7.0 5.7*  --   --   --   ALBUMIN 3.8 2.9* 3.5 4.1 4.0   Recent Labs    02/17/23 0701 02/17/23 1319 02/18/23 0746 02/19/23 0937 02/24/23 0000 06/02/23 0000 07/09/23 0000  WBC 9.9  --  10.1 10.2 11.1 6.8 6.5  NEUTROABS  --   --   --   --  8,181.00 4,313.00 3,959.00  HGB 6.7*   < > 7.4* 9.2* 9.7* 11.8* 11.7*  HCT 20.4*   < > 22.7* 27.7* 29* 35* 34*  MCV 100.5*  --  99.6 95.8  --   --   --   PLT 155  --  130* 153 359 282 253   < > = values in this interval not displayed.   Lab Results  Component Value Date   TSH 4.36 07/09/2023   No  results found for: "HGBA1C" Lab Results  Component Value Date   CHOL 217 (A) 03/29/2021   HDL 48 03/29/2021   LDLCALC 152 03/29/2021   TRIG 70 03/29/2021   CHOLHDL 4.7 08/01/2019    Significant Diagnostic Results in last 30 days:  No results found.  Assessment/Plan Depression, recurrent (HCC) on Mirtazapine(07/08/23 HPOA declined GDR), Sertraline, will increase Depakote 125mg  bid for better mood and behavioral management, TSH 4.36 07/09/23, observe.   Stage 3a chronic kidney disease (CKD) (HCC) Bun/creat 22/1.04 08/25/23  Vitamin B12 deficiency takes Vit B12, Fe, Hgb 11.7 07/09/23  Senile dementia (HCC) MMSE 19/30 12/22,  takes Exelon, Memantine, resides SNF [facility] for safety, care assistance. underwent Neurology consultation.  Chronic diastolic CHF (congestive heart failure) (HCC) Hx of 01/08/2017, Grade I diastolic dysfunction 2018 per echocardiogram due to SOB. 08/18/22 Echo EF 75%, pulmonary hypertension, mild mitral/tricuspid valves insufficiency.         The  patient's swelling ankles/BLE, mostly right, 08/04/22 negative DVT, BNP 54 07/09/23. Taking Furosemide. Bun/creat 22/1.0 08/25/23     Family/ staff Communication: plan of care reviewed with the patient and charge nurse.   Labs/tests ordered:  none  Time spend 30 minutes.

## 2023-10-30 ENCOUNTER — Encounter: Payer: Self-pay | Admitting: Nurse Practitioner

## 2023-10-30 NOTE — Assessment & Plan Note (Signed)
Bun/creat 22/1.04 08/25/23

## 2023-10-30 NOTE — Assessment & Plan Note (Signed)
MMSE 19/30 12/22,  takes Exelon, Memantine, resides SNF [facility] for safety, care assistance. underwent Neurology consultation.

## 2023-10-30 NOTE — Assessment & Plan Note (Addendum)
takes Vit B12, Fe, Hgb 11.7 07/09/23

## 2023-10-30 NOTE — Assessment & Plan Note (Addendum)
 Hx of 01/08/2017, Grade I diastolic dysfunction 2018 per echocardiogram due to SOB. 08/18/22 Echo EF 75%, pulmonary hypertension, mild mitral/tricuspid valves insufficiency.         The  patient's swelling ankles/BLE, mostly right, 08/04/22 negative DVT, BNP 54 07/09/23. Taking Furosemide. Bun/creat 22/1.0 08/25/23

## 2023-10-30 NOTE — Assessment & Plan Note (Signed)
on Mirtazapine(07/08/23 HPOA declined GDR), Sertraline, will increase Depakote 125mg  bid for better mood and behavioral management, TSH 4.36 07/09/23, observe.

## 2023-11-06 DIAGNOSIS — F330 Major depressive disorder, recurrent, mild: Secondary | ICD-10-CM | POA: Diagnosis not present

## 2023-11-06 DIAGNOSIS — F03A0 Unspecified dementia, mild, without behavioral disturbance, psychotic disturbance, mood disturbance, and anxiety: Secondary | ICD-10-CM | POA: Diagnosis not present

## 2023-11-16 ENCOUNTER — Encounter: Payer: Self-pay | Admitting: Sports Medicine

## 2023-11-16 ENCOUNTER — Non-Acute Institutional Stay (SKILLED_NURSING_FACILITY): Payer: Medicare PPO | Admitting: Sports Medicine

## 2023-11-16 DIAGNOSIS — D509 Iron deficiency anemia, unspecified: Secondary | ICD-10-CM | POA: Diagnosis not present

## 2023-11-16 DIAGNOSIS — I5032 Chronic diastolic (congestive) heart failure: Secondary | ICD-10-CM

## 2023-11-16 DIAGNOSIS — K5901 Slow transit constipation: Secondary | ICD-10-CM | POA: Diagnosis not present

## 2023-11-16 DIAGNOSIS — F411 Generalized anxiety disorder: Secondary | ICD-10-CM

## 2023-11-16 DIAGNOSIS — G309 Alzheimer's disease, unspecified: Secondary | ICD-10-CM

## 2023-11-16 DIAGNOSIS — F02818 Dementia in other diseases classified elsewhere, unspecified severity, with other behavioral disturbance: Secondary | ICD-10-CM

## 2023-11-16 DIAGNOSIS — F5101 Primary insomnia: Secondary | ICD-10-CM

## 2023-11-16 NOTE — Progress Notes (Unsigned)
Provider:  Andree Coss Location:   [facility]   Place of Service:   Skilled care  PCP: Mast, Man X, NP Patient Care Team: Mast, Man X, NP as PCP - General (Internal Medicine) Romero Belling, MD as Referring Physician (Physical Medicine and Rehabilitation) Merlene Laughter, MD (Inactive) as Consulting Physician (Internal Medicine)  Extended Emergency Contact Information Primary Emergency Contact: Soyla Dryer, [place] Home Phone: [phone] Relation: Daughter Secondary Emergency Contact: Myers,Lois  [place] Home Phone: [phone] Relation: Daughter  Code Status:  Goals of Care: Advanced Directive information    10/29/2023   10:05 AM  Advanced Directives  Does Patient Have a Medical Advance Directive? Yes  Type of Estate agent of [place];Living will;Out of facility DNR (pink MOST or yellow form)  Does patient want to make changes to medical advance directive? No - Patient declined  Copy of Healthcare Power of Attorney in Chart? Yes - validated most recent copy scanned in chart (See row information)  Pre-existing out of facility DNR order (yellow form or pink MOST form) Pink MOST form placed in chart (order not valid for inpatient use);Yellow form placed in chart (order not valid for inpatient use)      No chief complaint on file.   HPI: Patient is a 89 y.o. female seen today for routine visit for follow up on chronic problems Pt seen and examined in the living room  She knows her name Seems pleasant  Does not remember what she had for breakfast  Follows commands Denies pain   As per nursing staff pt has intermittent confusion,  since depakote was changed to morning , she is doing little better  Pt needs assistance with transferring,  Ambulates with walker  H/O Left hip fracture   - followed with ortho 10/14  Recommended follow up in 6 months  No weight bearing  restrictions   BASIC METABOLIC PANEL GLUCOSE 102 mg/dL 30-86 H Final  Fasting reference interval For someone without known diabetes, a glucose value between 100 and 125 mg/dL is consistent with prediabetes and should be confirmed with a follow-up test. UREA NITROGEN (BUN) 22 mg/dL 5-78 Final CREATININE 4.69 mg/dL 6.29-5.28 H Final EGFR 51 mL/min/1.73 m2 > OR = 60 L Final BUN/CREATININE RATIO 21 (calc) 6-22 Final SODIUM 139 mmol/L 135-146 Final POTASSIUM 4.2 mmol/L 3.5-5.3 Final CHLORIDE 104 mmol/L 98-110 Final   CBC (INCLUDES DIFF/PLT) WHITE BLOOD CELL COUNT 6.5 Thousand/u L 3.8-10.8 Final RED BLOOD CELL COUNT 3.62 Million/uL 3.80-5.10 L Final HEMOGLOBIN 11.7 g/dL 41.3-24.4 Final HEMATOCRIT 34.0 % 35.0-45.0 L Final MCV 93.9 fL 80.0-100.0 Final MCH 32.3 pg 27.0-33.0 Final MCHC 34.4 g/dL 01.0-27.2 Final RDW 53.6 % 11.0-15.0 Final PLATELET COUNT 253 Thousand/u L 140-400 Final MPV 9.2 fL 7.5-12.5 Final ABSOLUTE NEUTROPHILS 3959 cells/uL 1500-7800 Final ABSOLUTE LYMPHOCYTES 1846 cells/uL [id number] Final ABSOLUTE MONOCYTES 501 cells/uL 200-950 Final ABSOLUTE EOSINOPHILS 163 cells/uL 15-500 Final ABSOLUTE BASOPHILS 33 cells/uL 0-200 Final NEUTROPHILS 60.9 % Final LYMPHOCYTES 28.4 % Final MONOCYTES 7.7 % Final EOSINOPHILS 2.5 % Final BASOPHILS 0.5 % Final TSH 4.36 mIU/L 0.40-4.50 Fina  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   Dyslipidemia    Fibromyalgia  Granuloma annulare    History of uterine cancer 2003   treated with hysterectomy   Lumbar radicular syndrome    left L5   Macular degeneration    left eye   Sciatica    TIA (transient ischemic attack)    Past Surgical History:  Procedure Laterality Date   ABDOMINAL HYSTERECTOMY     BREAST BIOPSY  2007    CATARACT EXTRACTION     INTRAMEDULLARY (IM) NAIL INTERTROCHANTERIC Left 02/16/2023   Procedure: INTRAMEDULLARY (IM) NAIL INTERTROCHANTERIC;  Surgeon: London Sheer, MD;  Location: [facility];  Service: Orthopedics;  Laterality: Left;   TONSILLECTOMY      reports that she has never smoked. She has never used smokeless tobacco. She reports that she does not drink alcohol and does not use drugs. Social History   Socioeconomic History   Marital status: Widowed    Spouse name: Not on file   Number of children: Not on file   Years of education: 16   Highest education level: Not on file  Occupational History   Occupation: Retired Acupuncturist  Tobacco Use   Smoking status: Never   Smokeless tobacco: Never  Vaping Use   Vaping status: Never Used  Substance and Sexual Activity   Alcohol use: No   Drug use: No   Sexual activity: Not on file  Other Topics Concern   Not on file  Social History Narrative   Lives at [facility]   Left-handed    Caffeine: tea once a day   Social Determinants of Health   Financial Resource Strain: Not on file  Food Insecurity: No Food Insecurity (02/15/2023)   Hunger Vital Sign    Worried About Running Out of Food in the Last Year: Never true    Ran Out of Food in the Last Year: Never true  Transportation Needs: No Transportation Needs (02/15/2023)   PRAPARE - Administrator, Civil Service (Medical): No    Lack of Transportation (Non-Medical): No  Physical Activity: Not on file  Stress: Not on file  Social Connections: Not on file  Intimate Partner Violence: Not At Risk (02/15/2023)   Humiliation, Afraid, Rape, and Kick questionnaire    Fear of Current or Ex-Partner: No    Emotionally Abused: No    Physically Abused: No    Sexually Abused: No    Functional Status Survey:    Family History  Problem Relation Age of Onset   Depression Mother    Diabetes Father    Heart attack Father    Dementia  Sister    Pneumonia Brother    Breast cancer Daughter     Health Maintenance  Topic Date Due   COVID-19 Vaccine (7 - 2023-24 season) 08/16/2023   Medicare Annual Wellness (AWV)  05/25/2024   DTaP/Tdap/Td (3 - Td or Tdap) 10/13/2032   Pneumonia Vaccine 24+ Years old  Completed   INFLUENZA VACCINE  Completed   DEXA SCAN  Completed   Zoster Vaccines- Shingrix  Completed   HPV VACCINES  Aged Out    Allergies  Allergen Reactions   Aricept [Donepezil Hcl]     Muscle cramps   Latex Itching   Tramadol Other (See Comments)    Pt can't remember what side effects she had, she doesn't take it now    Outpatient Encounter Medications as of 11/16/2023  Medication Sig   acetaminophen (TYLENOL) 500 MG tablet Take 1,000 mg by mouth 2 (two) times daily.  Amino Acids-Protein Hydrolys (PRO-STAT) LIQD Take 30 mLs by mouth daily.   aspirin EC 81 MG tablet Take 81 mg by mouth daily. Swallow whole.   Calcium Carbonate-Vit D-Min (CALTRATE 600+D PLUS MINERALS) 600-800 MG-UNIT TABS Take 1 tablet by mouth daily.   divalproex (DEPAKOTE) 125 MG DR tablet Take 125 mg by mouth daily. UNSPECIFIED DEMENTIA, UNSPECIFIED SEVERITY, WITHOUT BEHAVIORAL DISTURBANCE, PSYCHOTIC DISTURBANCE, MOOD DISTURBANCE, AND ANXIETY (F03.90)   FERROUS SULFATE PO Take 7.5 mLs by mouth daily.   furosemide (LASIX) 20 MG tablet Take 20 mg by mouth every Monday, Wednesday, and Friday.   lactose free nutrition (BOOST) LIQD Take 237 mLs by mouth as needed.   melatonin 3 MG TABS tablet Take 3 mg by mouth at bedtime.   memantine (NAMENDA) 5 MG tablet Take by mouth 2 (two) times daily.   mirtazapine (REMERON) 30 MG tablet TAKE ONE TABLET AT BEDTIME.   Multiple Vitamins-Minerals (SENTRY SENIOR) TABS Take 1 tablet by mouth daily.   polyethylene glycol (MIRALAX) 17 g packet Take 17 g by mouth as needed.   potassium chloride SA (KLOR-CON M) 20 MEQ tablet Take 20 mEq by mouth every Monday, Wednesday, and Friday.   rivastigmine (EXELON) 4.6  mg/24hr Place 4.6 mg onto the skin daily.   senna (SENOKOT) 8.6 MG TABS tablet Take 1 tablet (8.6 mg total) by mouth at bedtime.   sertraline (ZOLOFT) 50 MG tablet Take 1 tablet (50 mg total) by mouth daily.   vitamin B-12 (CYANOCOBALAMIN) 500 MCG tablet Take 500 mcg by mouth daily.   No facility-administered encounter medications on file as of 11/16/2023.    Review of Systems  Respiratory:  Negative for cough.   Cardiovascular:  Negative for chest pain and leg swelling.  Gastrointestinal:  Negative for abdominal pain, blood in stool, constipation, diarrhea and vomiting.  Genitourinary:  Negative for dysuria and hematuria.  Neurological:  Negative for dizziness.  Psychiatric/Behavioral:  Positive for confusion.     There were no vitals filed for this visit. There is no height or weight on file to calculate BMI. Physical Exam Constitutional:      Appearance: Normal appearance.  Cardiovascular:     Rate and Rhythm: Normal rate and regular rhythm.  Pulmonary:     Effort: Pulmonary effort is normal. No respiratory distress.     Breath sounds: Normal breath sounds. No wheezing.  Abdominal:     General: Bowel sounds are normal. There is no distension.     Tenderness: There is no abdominal tenderness. There is no guarding or rebound.     Comments:    Musculoskeletal:        General: Swelling (very minimal) present. No tenderness.  Neurological:     Mental Status: She is alert. Mental status is at baseline.     Sensory: No sensory deficit.     Motor: No weakness.     Labs reviewed: Basic Metabolic Panel: Recent Labs    02/17/23 0352 02/18/23 0746 02/19/23 0937 02/24/23 0000 07/09/23 0000 08/25/23 0000  NA 132* 134* 135 138 140 139  K 4.3 3.6 3.5 4.5 4.5 4.2  CL 103 103 106 107 106 104  CO2 24 24 22 21 18  26*  GLUCOSE 157* 127* 110*  --   --   --   BUN 24* 24* 18 17 24* 22*  CREATININE 1.29* 1.19* 0.97 1.0 1.2* 1.0  CALCIUM 7.6* 7.7* 7.7* 8.3* 9.2 8.6*  MG  --   --   2.6*  --   --   --  Liver Function Tests: Recent Labs    02/15/23 2058 02/19/23 0937 02/24/23 0000 04/09/23 0000 07/09/23 0000  AST 24 35 57* 21 17  ALT 21 13 41* 18 17  ALKPHOS 54 49 75 132* 66  BILITOT 0.5 0.9  --   --   --   PROT 7.0 5.7*  --   --   --   ALBUMIN 3.8 2.9* 3.5 4.1 4.0   No results for input(s): "LIPASE", "AMYLASE" in the last 8760 hours. No results for input(s): "AMMONIA" in the last 8760 hours. CBC: Recent Labs    02/17/23 0701 02/17/23 1319 02/18/23 0746 02/19/23 0937 02/24/23 0000 06/02/23 0000 07/09/23 0000  WBC 9.9  --  10.1 10.2 11.1 6.8 6.5  NEUTROABS  --   --   --   --  8,181.00 4,313.00 3,959.00  HGB 6.7*   < > 7.4* 9.2* 9.7* 11.8* 11.7*  HCT 20.4*   < > 22.7* 27.7* 29* 35* 34*  MCV 100.5*  --  99.6 95.8  --   --   --   PLT 155  --  130* 153 359 282 253   < > = values in this interval not displayed.   Cardiac Enzymes: No results for input(s): "CKTOTAL", "CKMB", "CKMBINDEX", "TROPONINI" in the last 8760 hours. BNP: Invalid input(s): "POCBNP" No results found for: "HGBA1C" Lab Results  Component Value Date   TSH 4.36 07/09/2023   Lab Results  Component Value Date   VITAMINB12 672 02/17/2023   Lab Results  Component Value Date   FOLATE 18.0 02/17/2023   Lab Results  Component Value Date   IRON 22 (L) 02/17/2023   TIBC 265 02/17/2023   FERRITIN 45 02/17/2023    Imaging and Procedures obtained prior to SNF admission: DG FEMUR MIN 2 VIEWS LEFT  Result Date: 02/16/2023 CLINICAL DATA:  Postoperative EXAM: LEFT FEMUR 2 VIEWS COMPARISON:  Left hip x-ray 02/15/2023 FINDINGS: There is a new left femoral intramedullary nail and hip screw fixating comminuted intratrochanteric fracture. Alignment is anatomic. The lesser trochanter is displaced medially. There is no dislocation. There is lateral hip and leg soft tissue swelling, air and skin staples compatible with recent surgery. IMPRESSION: Left femoral intramedullary nail and hip screw  fixating comminuted intratrochanteric fracture. Electronically Signed   By: Darliss Cheney M.D.   On: 02/16/2023 21:17   DG FEMUR MIN 2 VIEWS LEFT  Result Date: 02/16/2023 CLINICAL DATA:  Intramedullary nail EXAM: LEFT FEMUR 2 VIEWS COMPARISON:  Hip x-ray 02/15/2023 FINDINGS: Nine intraoperative fluoroscopic views of the left hip. Left-sided hip screw and intramedullary nail fixating intratrochanteric fracture. Alignment is anatomic. Fluoroscopy time: 2 minutes and 56 seconds. Fluoroscopy dose 45.564 micro Boldon. IMPRESSION: Left hip screw and intramedullary nail fixating intratrochanteric fracture. Electronically Signed   By: Darliss Cheney M.D.   On: 02/16/2023 20:34   DG C-Arm 1-60 Min-No Report  Result Date: 02/16/2023 Fluoroscopy was utilized by the requesting physician.  No radiographic interpretation.   DG C-Arm 1-60 Min-No Report  Result Date: 02/16/2023 Fluoroscopy was utilized by the requesting physician.  No radiographic interpretation.   DG C-Arm 1-60 Min-No Report  Result Date: 02/16/2023 Fluoroscopy was utilized by the requesting physician.  No radiographic interpretation.   DG Knee 1-2 Views Left  Result Date: 02/16/2023 CLINICAL DATA:  Left knee pain EXAM: LEFT KNEE - 1-2 VIEW COMPARISON:  None Available. FINDINGS: Atypical frontal view due to knee being held in flexion. There is no obvious fracture involving the left knee on  these atypical views. There is tricompartment degenerative change and a probable trace effusion. There is soft tissue swelling anteriorly along the proximal tibia and prepatellar soft tissues. IMPRESSION: Atypical frontal view without obvious fracture involving the left knee. Tricompartment degenerative change with probable trace effusion. Soft tissue swelling anteriorly along the proximal lower leg and prepatellar soft tissues. Electronically Signed   By: Caprice Renshaw M.D.   On: 02/16/2023 16:51   DG Chest Portable 1 View  Result Date: 02/15/2023 CLINICAL DATA:   Hip fracture. EXAM: PORTABLE CHEST 1 VIEW COMPARISON:  Chest radiograph dated May 22, 2007 FINDINGS: The heart is normal in size. Atherosclerotic calcification of the aortic arch. Low lung volumes with left basilar opacity suggesting atelectasis or small effusion. Thoracic spondylosis. No acute osseous abnormality. IMPRESSION: Low lung volumes with left basilar opacity suggesting atelectasis or small effusion. Electronically Signed   By: Larose Hires D.O.   On: 02/15/2023 22:07   DG Hip Unilat W or Wo Pelvis 2-3 Views Left  Result Date: 02/15/2023 CLINICAL DATA:  Recent fall with hip pain, initial encounter EXAM: DG HIP (WITH OR WITHOUT PELVIS) 3V LEFT COMPARISON:  None Available. FINDINGS: Pelvic ring appears intact. There is a significantly comminuted left intratrochanteric fracture with impaction and angulation at the fracture site. Femoral head is well seated. No soft tissue abnormality is noted. IMPRESSION: Severely comminuted left intratrochanteric fracture. Electronically Signed   By: Alcide Clever M.D.   On: 02/15/2023 22:05   CT HEAD WO CONTRAST ( )  Result Date: 02/15/2023 CLINICAL DATA:  Recent fall with headaches and neck pain, initial encounter EXAM: CT HEAD WITHOUT CONTRAST CT CERVICAL SPINE WITHOUT CONTRAST TECHNIQUE: Multidetector CT imaging of the head and cervical spine was performed following the standard protocol without intravenous contrast. Multiplanar CT image reconstructions of the cervical spine were also generated. RADIATION DOSE REDUCTION: This exam was performed according to the departmental dose-optimization program which includes automated exposure control, adjustment of the mA and/or kV according to patient size and/or use of iterative reconstruction technique. COMPARISON:  05/21/2007 FINDINGS: CT HEAD FINDINGS Brain: No evidence of acute infarction, hemorrhage, hydrocephalus, extra-axial collection or mass lesion/mass effect. Progressive atrophic changes are noted commenced  with the patient's given age. Chronic white matter ischemic changes are seen. Small lacunar infarct in the right basal ganglia is noted. Vascular: No hyperdense vessel or unexpected calcification. Skull: Normal. Negative for fracture or focal lesion. Sinuses/Orbits: No acute finding. Other: None CT CERVICAL SPINE FINDINGS Alignment: Mild loss of normal cervical lordosis is noted. Skull base and vertebrae: 7 cervical segments are well visualized. Vertebral body height is well maintained. Multilevel osteophytic changes are seen. Multilevel disc space narrowing and facet hypertrophic changes are noted. No acute fracture or acute facet abnormality is noted. Soft tissues and spinal canal: Surrounding soft tissue structures demonstrate diffuse vascular calcifications. No hematoma or other focal abnormality is noted. Upper chest: Visualized lung apices are within normal limits. Other: None IMPRESSION: CT of the head: Progressive atrophic and ischemic changes commensurate with the patient's given age. No acute abnormality noted. CT of the cervical spine: Multilevel degenerative change without acute abnormality. Electronically Signed   By: Alcide Clever M.D.   On: 02/15/2023 21:58   CT Cervical Spine Wo Contrast  Result Date: 02/15/2023 CLINICAL DATA:  Recent fall with headaches and neck pain, initial encounter EXAM: CT HEAD WITHOUT CONTRAST CT CERVICAL SPINE WITHOUT CONTRAST TECHNIQUE: Multidetector CT imaging of the head and cervical spine was performed following the standard protocol without intravenous  contrast. Multiplanar CT image reconstructions of the cervical spine were also generated. RADIATION DOSE REDUCTION: This exam was performed according to the departmental dose-optimization program which includes automated exposure control, adjustment of the mA and/or kV according to patient size and/or use of iterative reconstruction technique. COMPARISON:  05/21/2007 FINDINGS: CT HEAD FINDINGS Brain: No evidence of acute  infarction, hemorrhage, hydrocephalus, extra-axial collection or mass lesion/mass effect. Progressive atrophic changes are noted commenced with the patient's given age. Chronic white matter ischemic changes are seen. Small lacunar infarct in the right basal ganglia is noted. Vascular: No hyperdense vessel or unexpected calcification. Skull: Normal. Negative for fracture or focal lesion. Sinuses/Orbits: No acute finding. Other: None CT CERVICAL SPINE FINDINGS Alignment: Mild loss of normal cervical lordosis is noted. Skull base and vertebrae: 7 cervical segments are well visualized. Vertebral body height is well maintained. Multilevel osteophytic changes are seen. Multilevel disc space narrowing and facet hypertrophic changes are noted. No acute fracture or acute facet abnormality is noted. Soft tissues and spinal canal: Surrounding soft tissue structures demonstrate diffuse vascular calcifications. No hematoma or other focal abnormality is noted. Upper chest: Visualized lung apices are within normal limits. Other: None IMPRESSION: CT of the head: Progressive atrophic and ischemic changes commensurate with the patient's given age. No acute abnormality noted. CT of the cervical spine: Multilevel degenerative change without acute abnormality. Electronically Signed   By: Alcide Clever M.D.   On: 02/15/2023 21:58    Assessment/Plan  Major Neurocognitive disorder with behavioral changes Cont with supportive care Cont with namenda, rivastigmine Cont with depakote   GAD  Cont with zoloft  Diastolic dysfunction  Lungs clear  Very minimal lower extremity swelling  ECHO 2018  EF 75% Cont with lasix   H/o Left hip fracture  Followed with orthopedics Denies pain    Insomnia Cont with melatonin, rameron   Constipation  Cont with senna  IDA  Will check cbc On iron supplements   Family/ staff Communication:  care plan discussed with the nursing staff  Labs/tests ordered: cbc  30 min  Total time  spent for obtaining history,  performing a medically appropriate examination and evaluation, reviewing the tests, , ordering  tests,  , documenting clinical information in the electronic or other health record, independently interpreting results ,care coordination (not separately reported)

## 2023-11-17 DIAGNOSIS — M79671 Pain in right foot: Secondary | ICD-10-CM | POA: Diagnosis not present

## 2023-11-17 DIAGNOSIS — L602 Onychogryphosis: Secondary | ICD-10-CM | POA: Diagnosis not present

## 2023-11-17 DIAGNOSIS — M79672 Pain in left foot: Secondary | ICD-10-CM | POA: Diagnosis not present

## 2023-11-19 DIAGNOSIS — I10 Essential (primary) hypertension: Secondary | ICD-10-CM | POA: Diagnosis not present

## 2023-12-11 DIAGNOSIS — F03A0 Unspecified dementia, mild, without behavioral disturbance, psychotic disturbance, mood disturbance, and anxiety: Secondary | ICD-10-CM | POA: Diagnosis not present

## 2023-12-11 DIAGNOSIS — F330 Major depressive disorder, recurrent, mild: Secondary | ICD-10-CM | POA: Diagnosis not present

## 2023-12-22 DIAGNOSIS — I10 Essential (primary) hypertension: Secondary | ICD-10-CM | POA: Diagnosis not present

## 2023-12-22 LAB — CBC AND DIFFERENTIAL
HCT: 33 — AB (ref 36–46)
Hemoglobin: 11.1 — AB (ref 12.0–16.0)
Neutrophils Absolute: 3412
Platelets: 230 10*3/uL (ref 150–400)
WBC: 6.6

## 2023-12-22 LAB — CBC: RBC: 3.43 — AB (ref 3.87–5.11)

## 2023-12-24 DIAGNOSIS — D649 Anemia, unspecified: Secondary | ICD-10-CM | POA: Diagnosis not present

## 2023-12-24 LAB — CBC AND DIFFERENTIAL
HCT: 37 (ref 36–46)
Hemoglobin: 12.3 (ref 12.0–16.0)
Neutrophils Absolute: 3835
Platelets: 260 10*3/uL (ref 150–400)
WBC: 6.6

## 2023-12-24 LAB — CBC: RBC: 3.81 — AB (ref 3.87–5.11)

## 2024-01-01 ENCOUNTER — Non-Acute Institutional Stay (SKILLED_NURSING_FACILITY): Payer: Self-pay | Admitting: Nurse Practitioner

## 2024-01-01 ENCOUNTER — Encounter: Payer: Self-pay | Admitting: Nurse Practitioner

## 2024-01-01 DIAGNOSIS — K5901 Slow transit constipation: Secondary | ICD-10-CM

## 2024-01-01 DIAGNOSIS — F0390 Unspecified dementia without behavioral disturbance: Secondary | ICD-10-CM

## 2024-01-01 DIAGNOSIS — F339 Major depressive disorder, recurrent, unspecified: Secondary | ICD-10-CM

## 2024-01-01 DIAGNOSIS — E782 Mixed hyperlipidemia: Secondary | ICD-10-CM

## 2024-01-01 DIAGNOSIS — I5032 Chronic diastolic (congestive) heart failure: Secondary | ICD-10-CM

## 2024-01-01 DIAGNOSIS — M150 Primary generalized (osteo)arthritis: Secondary | ICD-10-CM | POA: Diagnosis not present

## 2024-01-01 DIAGNOSIS — N1831 Chronic kidney disease, stage 3a: Secondary | ICD-10-CM | POA: Diagnosis not present

## 2024-01-01 DIAGNOSIS — D509 Iron deficiency anemia, unspecified: Secondary | ICD-10-CM

## 2024-01-01 NOTE — Assessment & Plan Note (Signed)
 Bun/creat 22/1.04 08/25/23

## 2024-01-01 NOTE — Assessment & Plan Note (Signed)
not taking statin, LDL 64 02/11/22, takes ASA 81mg qd, refused statin. 

## 2024-01-01 NOTE — Assessment & Plan Note (Signed)
takes Vit B12, Fe Hgb 11.1 12/22/23, monitor H+H

## 2024-01-01 NOTE — Assessment & Plan Note (Addendum)
Better, on Mirtazapine(07/08/23 HPOA declined GDR), Sertraline, 11/26/23 increased Depakote to 125mg  tid, TSH 4.36 07/09/23 Weight loss about #10Ibs in the past month, asymptomatic, ? Increased Depakote 125mg  tid 11/26/23 contributory, desirable per HPOA, will continue to monitor weight. Update CMP/eGFR

## 2024-01-01 NOTE — Assessment & Plan Note (Signed)
Multiple sites, w/c for mobility, Tylenol is adequate for pains/aches.

## 2024-01-01 NOTE — Assessment & Plan Note (Signed)
stable, on MiraLax, Senokot

## 2024-01-01 NOTE — Assessment & Plan Note (Signed)
Hx of grade I diastolic dysfunction 2018 per echocardiogram due to SOB, 08/18/22 EF 75%, pulmonary hypertension, mild tricuspid/mitral valve insufficiency, on Furosemide, weight has been stable, 07/09/23 BNP 54

## 2024-01-01 NOTE — Progress Notes (Unsigned)
Location:   SNF [facility] Room Number: 4 Place of Service:  SNF (31) Provider: Arna Snipe Georgene Kopper NP  Ginevra Tacker X, NP  Patient Care Team: Lauris Keepers X, NP as PCP - General (Internal Medicine) Romero Belling, MD as Referring Physician (Physical Medicine and Rehabilitation) Merlene Laughter, MD (Inactive) as Consulting Physician (Internal Medicine)  Extended Emergency Contact Information Primary Emergency Contact: Soyla Dryer, [place] Home Phone: [phone] Relation: Daughter Secondary Emergency Contact: Myers,Lois  [place] Home Phone: [phone] Relation: Daughter  Code Status:  DNR Goals of care: Advanced Directive information    10/29/2023   10:05 AM  Advanced Directives  Does Patient Have a Medical Advance Directive? Yes  Type of Estate agent of [place];Living will;Out of facility DNR (pink MOST or yellow form)  Does patient want to make changes to medical advance directive? No - Patient declined  Copy of Healthcare Power of Attorney in Chart? Yes - validated most recent copy scanned in chart (See row information)  Pre-existing out of facility DNR order (yellow form or pink MOST form) Pink MOST form placed in chart (order not valid for inpatient use);Yellow form placed in chart (order not valid for inpatient use)     Chief Complaint  Patient presents with  . Medical Management of Chronic Issues    HPI:  Pt is a 89 y.o. female seen today for medical management of chronic diseases.     Hospitalized 02/15/23-02/19/23 for left hip fx sustained from a ground fall, ORIF IM nail, healed. Last f/u 09/28/23             R hip pain, X-ray R hip no acute fx or dislocation The  patient's swelling ankles/BLE, mostly right, 08/04/22 negative DVT, BNP 54 07/09/23. Taking Furosemide. Bun/creat 22/1.0 08/25/23 Hx of grade I diastolic dysfunction 2018 per echocardiogram due to SOB, 08/18/22 EF 75%, pulmonary  hypertension, mild tricuspid/mitral valve insufficiency, on Furosemide, weight has been stable, 07/09/23 BNP 54 Depression/anxiety, on Mirtazapine(07/08/23 HPOA declined GDR), Sertraline, Depakote, TSH 4.36 07/09/23. Weight loss about #10Ibs in the past month, desirable.  Hx of Hyperlipidemia, not taking statin, LDL 64 02/11/22, takes ASA 81mg  qd, refused statin. CKD Bun/creat 22/1.04 08/25/23 Vit B12 deficiency, takes Vit B12, Fe Hgb 11.7 07/09/23 Dementia, MMSE 19/30 12/22,  takes Exelon, Memantine, resides SNF [facility] for safety, care assistance. underwent Neurology consultation. Dyspnea, Hx of 01/08/2017, Grade I diastolic dysfunction 2018 per echocardiogram due to SOB. 08/18/22 Echo EF 75%, pulmonary hypertension, mild mitral/tricuspid valves insufficiency.    Constipation, stable, on MiraLax, Senokot.       Past Medical History:  Diagnosis Date  . BPV (benign positional vertigo)   . Cancer [facility])    uterine cancer  . Carpal tunnel syndrome on left 06/02/2017  . Cognitive changes   . Depression   . Droopy eyelid, right   . 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 Dyslipidemia   . Fibromyalgia   . Granuloma annulare   . History of uterine cancer 2003   treated with hysterectomy  . Lumbar radicular syndrome    left L5  . Macular degeneration    left eye  . Sciatica   . TIA (transient ischemic attack)    Past Surgical History:  Procedure Laterality Date  . ABDOMINAL HYSTERECTOMY    . BREAST BIOPSY  2007  . CATARACT EXTRACTION    . INTRAMEDULLARY (IM) NAIL INTERTROCHANTERIC Left 02/16/2023  Procedure: INTRAMEDULLARY (IM) NAIL INTERTROCHANTERIC;  Surgeon: London Sheer, MD;  Location: [facility];  Service: Orthopedics;  Laterality: Left;  . TONSILLECTOMY      Allergies  Allergen Reactions  . Aricept [Donepezil Hcl]     Muscle cramps  . Latex Itching  . Tramadol Other (See Comments)    Pt can't remember what side effects she had, she doesn't take it now    Allergies as of 01/01/2024        Reactions   Aricept [donepezil Hcl]    Muscle cramps   Latex Itching   Tramadol Other (See Comments)   Pt can't remember what side effects she had, she doesn't take it now        Medication List        Accurate as of January 01, 2024 11:59 PM. If you have any questions, ask your nurse or doctor.          acetaminophen 500 MG tablet Commonly known as: TYLENOL Take 1,000 mg by mouth 2 (two) times daily.   aspirin EC 81 MG tablet Take 81 mg by mouth daily. Swallow whole.   Caltrate 600+D Plus Minerals 600-800 MG-UNIT Tabs Take 1 tablet by mouth daily.   divalproex 125 MG DR tablet Commonly known as: DEPAKOTE Take 125 mg by mouth daily. UNSPECIFIED DEMENTIA, UNSPECIFIED SEVERITY, WITHOUT BEHAVIORAL DISTURBANCE, PSYCHOTIC DISTURBANCE, MOOD DISTURBANCE, AND ANXIETY (F03.90)   FERROUS SULFATE PO Take 7.5 mLs by mouth daily.   furosemide 20 MG tablet Commonly known as: LASIX Take 20 mg by mouth every Monday, Wednesday, and Friday.   lactose free nutrition Liqd Take 237 mLs by mouth as needed.   melatonin 3 MG Tabs tablet Take 3 mg by mouth at bedtime.   memantine 5 MG tablet Commonly known as: NAMENDA Take by mouth 2 (two) times daily.   MiraLax 17 g packet Generic drug: polyethylene glycol Take 17 g by mouth as needed.   mirtazapine 30 MG tablet Commonly known as: REMERON TAKE ONE TABLET AT BEDTIME.   potassium chloride SA 20 MEQ tablet Commonly known as: KLOR-CON M Take 20 mEq by mouth every Monday, Wednesday, and Friday.   Pro-Stat Liqd Take 30 mLs by mouth daily.   rivastigmine 4.6 mg/24hr Commonly known as: EXELON Place 4.6 mg onto the skin daily.   senna 8.6 MG Tabs tablet Commonly known as: SENOKOT Take 1 tablet (8.6 mg total) by mouth at bedtime.   [facility] Tabs Take 1 tablet by mouth daily.   sertraline 50 MG tablet Commonly known as: ZOLOFT Take 1 tablet (50 mg total) by mouth daily.   vitamin B-12 500 MCG tablet Commonly  known as: CYANOCOBALAMIN Take 500 mcg by mouth daily.        Review of Systems  Unable to perform ROS: Dementia    Immunization History  Administered Date(s) Administered  . Influenza, High Dose Seasonal PF 09/23/2017, 09/28/2019, 10/08/2023  . Influenza,inj,Quad PF,6+ Mos 09/16/2018  . Influenza-Unspecified 09/14/2017, 09/26/2020, 10/03/2021, 10/06/2022  . Moderna SARS-COV2 Booster Vaccination 10/23/2020, 05/14/2021  . Moderna Sars-Covid-2 Vaccination 12/19/2019, 01/16/2020, 05/14/2021  . Pneumococcal Conjugate-13 02/21/2014  . Pneumococcal Polysaccharide-23 02/21/2002  . Pneumococcal-Unspecified 12/15/2001  . Tdap 12/15/2009, 10/13/2022  . Unspecified SARS-COV-2 Vaccination 10/16/2022  . Zoster Recombinant(Shingrix) 01/12/2023, 04/15/2023  . Zoster, Unspecified 12/15/2005   Pertinent  Health Maintenance Due  Topic Date Due  . INFLUENZA VACCINE  Completed  . DEXA SCAN  Completed      12/29/2018    4:11 PM  02/08/2019    1:54 PM 03/07/2020   12:54 PM 02/20/2023    2:52 PM 05/26/2023    9:34 AM  Fall Risk  Falls in the past year? 0 0 0 1 0  Was there an injury with Fall? 0 0  1 0  Fall Risk Category Calculator 0 0  3 0  Fall Risk Category (Retired) Low Low     (RETIRED) Patient Fall Risk Level Low fall risk Low fall risk     Patient at Risk for Falls Due to    History of fall(s) Impaired balance/gait  Fall risk Follow up    Falls evaluation completed Falls evaluation completed   Functional Status Survey:    Vitals:   01/01/24 1133  BP: 122/70  Pulse: 70  Resp: 18  Temp: (!) 97.5 F (36.4 C)  SpO2: 94%  Weight: 151 lb 8 oz (68.7 kg)   Body mass index is 28.63 kg/m. Physical Exam Vitals and nursing note reviewed.  Constitutional:      Appearance: Normal appearance.  HENT:     Head: Normocephalic and atraumatic.     Nose: Nose normal.     Mouth/Throat:     Mouth: Mucous membranes are moist.     Pharynx: No posterior oropharyngeal erythema.  Eyes:      Extraocular Movements: Extraocular movements intact.     Conjunctiva/sclera: Conjunctivae normal.     Pupils: Pupils are equal, round, and reactive to light.  Cardiovascular:     Rate and Rhythm: Normal rate and regular rhythm.     Heart sounds: No murmur heard. Pulmonary:     Effort: Pulmonary effort is normal.     Breath sounds: No rales.  Abdominal:     General: Bowel sounds are normal.     Palpations: Abdomen is soft.     Tenderness: There is no abdominal tenderness.  Musculoskeletal:     Cervical back: Normal range of motion and neck supple.     Right lower leg: Edema present.     Left lower leg: Edema present.     Comments: Trace edema BLE  Skin:    General: Skin is warm and dry.     Comments: Left hip surgical incision healed.   Neurological:     General: No focal deficit present.     Mental Status: She is alert. Mental status is at baseline.     Gait: Gait abnormal.     Comments: Oriented to person  Psychiatric:     Comments: Anxious and combative when assistance with ADL offered.     Labs reviewed: Recent Labs    02/17/23 0352 02/18/23 0746 02/19/23 0937 02/24/23 0000 07/09/23 0000 08/25/23 0000  NA 132* 134* 135 138 140 139  K 4.3 3.6 3.5 4.5 4.5 4.2  CL 103 103 106 107 106 104  CO2 24 24 22 21 18  26*  GLUCOSE 157* 127* 110*  --   --   --   BUN 24* 24* 18 17 24* 22*  CREATININE 1.29* 1.19* 0.97 1.0 1.2* 1.0  CALCIUM 7.6* 7.7* 7.7* 8.3* 9.2 8.6*  MG  --   --  2.6*  --   --   --    Recent Labs    02/15/23 2058 02/19/23 0937 02/24/23 0000 04/09/23 0000 07/09/23 0000  AST 24 35 57* 21 17  ALT 21 13 41* 18 17  ALKPHOS 54 49 75 132* 66  BILITOT 0.5 0.9  --   --   --  PROT 7.0 5.7*  --   --   --   ALBUMIN 3.8 2.9* 3.5 4.1 4.0   Recent Labs    02/17/23 0701 02/17/23 1319 02/18/23 0746 02/19/23 0937 02/24/23 0000 06/02/23 0000 07/09/23 0000  WBC 9.9  --  10.1 10.2 11.1 6.8 6.5  NEUTROABS  --   --   --   --  8,181.00 4,313.00 3,959.00  HGB  6.7*   < > 7.4* 9.2* 9.7* 11.8* 11.7*  HCT 20.4*   < > 22.7* 27.7* 29* 35* 34*  MCV 100.5*  --  99.6 95.8  --   --   --   PLT 155  --  130* 153 359 282 253   < > = values in this interval not displayed.   Lab Results  Component Value Date   TSH 4.36 07/09/2023   No results found for: "HGBA1C" Lab Results  Component Value Date   CHOL 217 (A) 03/29/2021   HDL 48 03/29/2021   LDLCALC 152 03/29/2021   TRIG 70 03/29/2021   CHOLHDL 4.7 08/01/2019    Significant Diagnostic Results in last 30 days:  No results found.  Assessment/Plan  Senile dementia (HCC) MMSE 19/30 12/22,  takes Exelon, Memantine, resides SNF [facility] for safety, care assistance. underwent Neurology consultation.  Chronic diastolic CHF (congestive heart failure) (HCC) Hx of grade I diastolic dysfunction 2018 per echocardiogram due to SOB, 08/18/22 EF 75%, pulmonary hypertension, mild tricuspid/mitral valve insufficiency, on Furosemide, weight has been stable, 07/09/23 BNP 54  Osteoarthritis, multiple sites Multiple sites, w/c for mobility, Tylenol is adequate for pains/aches.   Hyperlipidemia  not taking statin, LDL 64 02/11/22, takes ASA 81mg  qd, refused statin.  Stage 3a chronic kidney disease (CKD) (HCC) Bun/creat 22/1.04 08/25/23  Iron deficiency anemia  takes Vit B12, Fe Hgb 11.1 12/22/23, monitor H+H  Depression, recurrent (HCC) Better, on Mirtazapine(07/08/23 HPOA declined GDR), Sertraline, 11/26/23 increased Depakote to 125mg  tid, TSH 4.36 07/09/23 Weight loss about #10Ibs in the past month, asymptomatic, ? Increased Depakote 125mg  tid 11/26/23 contributory, desirable per HPOA, will continue to monitor weight. Update CMP/eGFR  Slow transit constipation stable, on MiraLax, Senokot.    Family/ staff Communication: plan of care reviewed with the patient and charge nurse.   Labs/tests ordered:  CMP/eGFR  Time spend 30 minutes.

## 2024-01-01 NOTE — Assessment & Plan Note (Signed)
MMSE 19/30 12/22,  takes Exelon, Memantine, resides SNF [facility] for safety, care assistance. underwent Neurology consultation.

## 2024-01-05 DIAGNOSIS — D649 Anemia, unspecified: Secondary | ICD-10-CM | POA: Diagnosis not present

## 2024-01-05 LAB — HEPATIC FUNCTION PANEL
ALT: 11 U/L (ref 7–35)
AST: 14 (ref 13–35)
Alkaline Phosphatase: 46 (ref 25–125)
Bilirubin, Total: 0.4

## 2024-01-05 LAB — COMPREHENSIVE METABOLIC PANEL
Albumin: 3.4 — AB (ref 3.5–5.0)
Calcium: 8.8 (ref 8.7–10.7)
Globulin: 2.4

## 2024-01-05 LAB — BASIC METABOLIC PANEL
BUN: 21 (ref 4–21)
CO2: 26 — AB (ref 13–22)
Chloride: 104 (ref 99–108)
Creatinine: 1.2 — AB (ref 0.5–1.1)
Glucose: 89
Potassium: 4.4 meq/L (ref 3.5–5.1)
Sodium: 138 (ref 137–147)

## 2024-01-05 LAB — CBC AND DIFFERENTIAL
HCT: 34 — AB (ref 36–46)
Hemoglobin: 11.5 — AB (ref 12.0–16.0)
Neutrophils Absolute: 2689
Platelets: 221 10*3/uL (ref 150–400)
WBC: 5.4

## 2024-01-05 LAB — CBC: RBC: 3.57 — AB (ref 3.87–5.11)

## 2024-01-05 NOTE — Telephone Encounter (Signed)
Message routed to PCP Mast, Man X, NP

## 2024-01-07 NOTE — Telephone Encounter (Signed)
Mast, Man X, NP to     01/07/24 10:16 AM   Can you abstract the recent lab results? So Beth can see them in MyChart.  Updates:  #10Ibs weight loss was error in weight measurement.  Furosemide was increased for more swelling in leg.  Thank you

## 2024-01-07 NOTE — Telephone Encounter (Signed)
Message routed to Chrae to abstract labs when time allows.

## 2024-01-22 DIAGNOSIS — F03A0 Unspecified dementia, mild, without behavioral disturbance, psychotic disturbance, mood disturbance, and anxiety: Secondary | ICD-10-CM | POA: Diagnosis not present

## 2024-01-22 DIAGNOSIS — F330 Major depressive disorder, recurrent, mild: Secondary | ICD-10-CM | POA: Diagnosis not present

## 2024-01-26 ENCOUNTER — Telehealth: Payer: Self-pay | Admitting: Neurology

## 2024-01-26 NOTE — Telephone Encounter (Signed)
Daughter called and cx appointment because where pt resides there are providers there to assist re: pt's dementia.  Daughter will call back if needed.

## 2024-02-01 ENCOUNTER — Encounter: Payer: Self-pay | Admitting: Sports Medicine

## 2024-02-01 ENCOUNTER — Non-Acute Institutional Stay (SKILLED_NURSING_FACILITY): Payer: Self-pay | Admitting: Sports Medicine

## 2024-02-01 DIAGNOSIS — G309 Alzheimer's disease, unspecified: Secondary | ICD-10-CM | POA: Diagnosis not present

## 2024-02-01 DIAGNOSIS — R0981 Nasal congestion: Secondary | ICD-10-CM

## 2024-02-01 DIAGNOSIS — J111 Influenza due to unidentified influenza virus with other respiratory manifestations: Secondary | ICD-10-CM

## 2024-02-01 DIAGNOSIS — F02818 Dementia in other diseases classified elsewhere, unspecified severity, with other behavioral disturbance: Secondary | ICD-10-CM

## 2024-02-01 NOTE — Progress Notes (Signed)
 Provider:  Dr. Venita Sheffield Location:  [facility] Place of Service:   Skilled care nursing facility  PCP: Mast, Man X, NP Patient Care Team: Mast, Man X, NP as PCP - General (Internal Medicine) Romero Belling, MD as Referring Physician (Physical Medicine and Rehabilitation) Merlene Laughter, MD (Inactive) as Consulting Physician (Internal Medicine)  Extended Emergency Contact Information Primary Emergency Contact: Soyla Dryer,  [place] Home Phone: [phone] Relation: Daughter Secondary Emergency Contact: Myers,Lois  [place] Home Phone: [phone] Relation: Daughter  Goals of Care: Advanced Directive information    10/29/2023   10:05 AM  Advanced Directives  Does Patient Have a Medical Advance Directive? Yes  Type of Estate agent of [place];Living will;Out of facility DNR (pink MOST or yellow form)  Does patient want to make changes to medical advance directive? No - Patient declined  Copy of Healthcare Power of Attorney in Chart? Yes - validated most recent copy scanned in chart (See row information)  Pre-existing out of facility DNR order (yellow form or pink MOST form) Pink MOST form placed in chart (order not valid for inpatient use);Yellow form placed in chart (order not valid for inpatient use)       History of Present Illness         89 yr old F with h/o Dementia, rt hip pain, diastolic dysfunction, depression, anxiety is evaluated for an acute visit for flu.  Pt seen and examined in her room  She is sitting in her wheel chair Denies fevers, runny nose, cough , sob, abdominal pain, myalgias, nausea, vomiting. As per nursing staff pt is having nasal congestion. Pt is able to speak in full sentences, does not appear to be in distress.     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   Dyslipidemia    Fibromyalgia    Granuloma annulare    History of uterine cancer 2003   treated with hysterectomy   Lumbar radicular syndrome    left L5   Macular degeneration    left eye   Sciatica    TIA (transient ischemic attack)    Past Surgical History:  Procedure Laterality Date   ABDOMINAL HYSTERECTOMY     BREAST BIOPSY  2007   CATARACT EXTRACTION     INTRAMEDULLARY (IM) NAIL INTERTROCHANTERIC Left 02/16/2023   Procedure: INTRAMEDULLARY (IM) NAIL INTERTROCHANTERIC;  Surgeon: London Sheer, MD;  Location: [facility];  Service: Orthopedics;  Laterality: Left;   TONSILLECTOMY      reports that she has never smoked. She has never used smokeless tobacco. She reports that she does not drink alcohol and does not use drugs. Social History   Socioeconomic History   Marital status: Widowed    Spouse name: Not on file   Number of children: Not on file   Years of education: 16   Highest education level: Not on file  Occupational History   Occupation: Retired Acupuncturist  Tobacco Use   Smoking status: Never   Smokeless tobacco: Never  Vaping Use   Vaping status: Never Used  Substance and Sexual Activity   Alcohol use: No   Drug use: No   Sexual  activity: Not on file  Other Topics Concern   Not on file  Social History Narrative   Lives at [facility]   Left-handed    Caffeine: tea once a day   Social Drivers of Health   Financial Resource Strain: Not on file  Food Insecurity: No Food Insecurity (02/15/2023)   Hunger Vital Sign    Worried About Running Out of Food in the Last Year: Never true    Ran Out of Food in the Last Year: Never true  Transportation Needs: No Transportation Needs (02/15/2023)   PRAPARE - Administrator, Civil Service (Medical): No    Lack of Transportation (Non-Medical): No   Physical Activity: Not on file  Stress: Not on file  Social Connections: Not on file  Intimate Partner Violence: Not At Risk (02/15/2023)   Humiliation, Afraid, Rape, and Kick questionnaire    Fear of Current or Ex-Partner: No    Emotionally Abused: No    Physically Abused: No    Sexually Abused: No    Functional Status Survey:    Family History  Problem Relation Age of Onset   Depression Mother    Diabetes Father    Heart attack Father    Dementia Sister    Pneumonia Brother    Breast cancer Daughter     Health Maintenance  Topic Date Due   COVID-19 Vaccine (7 - 2024-25 season) 08/16/2023   Medicare Annual Wellness (AWV)  05/25/2024   DTaP/Tdap/Td (3 - Td or Tdap) 10/13/2032   Pneumonia Vaccine 44+ Years old  Completed   INFLUENZA VACCINE  Completed   DEXA SCAN  Completed   Zoster Vaccines- Shingrix  Completed   HPV VACCINES  Aged Out    Allergies  Allergen Reactions   Aricept [Donepezil Hcl]     Muscle cramps   Latex Itching   Tramadol Other (See Comments)    Pt can't remember what side effects she had, she doesn't take it now    Outpatient Encounter Medications as of 02/01/2024  Medication Sig   acetaminophen (TYLENOL) 500 MG tablet Take 1,000 mg by mouth 2 (two) times daily.   Amino Acids-Protein Hydrolys (PRO-STAT) LIQD Take 30 mLs by mouth daily.   aspirin EC 81 MG tablet Take 81 mg by mouth daily. Swallow whole.   Calcium Carbonate-Vit D-Min (CALTRATE 600+D PLUS MINERALS) 600-800 MG-UNIT TABS Take 1 tablet by mouth daily.   divalproex (DEPAKOTE) 125 MG DR tablet Take 125 mg by mouth daily. UNSPECIFIED DEMENTIA, UNSPECIFIED SEVERITY, WITHOUT BEHAVIORAL DISTURBANCE, PSYCHOTIC DISTURBANCE, MOOD DISTURBANCE, AND ANXIETY (F03.90)   FERROUS SULFATE PO Take 7.5 mLs by mouth daily.   furosemide (LASIX) 20 MG tablet Take 20 mg by mouth every Monday, Wednesday, and Friday.   lactose free nutrition (BOOST) LIQD Take 237 mLs by mouth as needed.   melatonin 3 MG TABS  tablet Take 3 mg by mouth at bedtime.   memantine (NAMENDA) 5 MG tablet Take by mouth 2 (two) times daily.   mirtazapine (REMERON) 30 MG tablet TAKE ONE TABLET AT BEDTIME.   Multiple Vitamins-Minerals (SENTRY SENIOR) TABS Take 1 tablet by mouth daily.   polyethylene glycol (MIRALAX) 17 g packet Take 17 g by mouth as needed.   potassium chloride SA (KLOR-CON M) 20 MEQ tablet Take 20 mEq by mouth every Monday, Wednesday, and Friday.   rivastigmine (EXELON) 4.6 mg/24hr Place 4.6 mg onto the skin daily.   senna (SENOKOT) 8.6 MG TABS tablet Take 1 tablet (  8.6 mg total) by mouth at bedtime.   sertraline (ZOLOFT) 50 MG tablet Take 1 tablet (50 mg total) by mouth daily.   vitamin B-12 (CYANOCOBALAMIN) 500 MCG tablet Take 500 mcg by mouth daily.   No facility-administered encounter medications on file as of 02/01/2024.    Review of Systems  Unable to perform ROS: Dementia  Constitutional:  Negative for fever.  Respiratory:  Negative for cough and shortness of breath.   Gastrointestinal:  Negative for nausea and vomiting.   Negative unless indicated in HPI.  There were no vitals filed for this visit. There is no height or weight on file to calculate BMI. BP Readings from Last 3 Encounters:  01/01/24 122/70  11/16/23 133/66  10/29/23 126/70   Wt Readings from Last 3 Encounters:  01/01/24 151 lb 8 oz (68.7 kg)  11/16/23 161 lb 12.8 oz (73.4 kg)  10/29/23 160 lb 8 oz (72.8 kg)   Physical Exam Constitutional:      Appearance: Normal appearance.  HENT:     Head: Normocephalic and atraumatic.  Cardiovascular:     Rate and Rhythm: Normal rate and regular rhythm.  Pulmonary:     Effort: Pulmonary effort is normal. No respiratory distress.     Breath sounds: Normal breath sounds. No wheezing.  Abdominal:     General: Bowel sounds are normal. There is no distension.     Tenderness: There is no abdominal tenderness. There is no guarding or rebound.     Comments:    Musculoskeletal:         General: No swelling or tenderness.  Neurological:     Mental Status: She is alert. Mental status is at baseline.     Motor: No weakness.     Labs reviewed: Basic Metabolic Panel: Recent Labs    02/17/23 0352 02/18/23 0746 02/19/23 0937 02/24/23 0000 07/09/23 0000 08/25/23 0000  NA 132* 134* 135 138 140 139  K 4.3 3.6 3.5 4.5 4.5 4.2  CL 103 103 106 107 106 104  CO2 24 24 22 21 18  26*  GLUCOSE 157* 127* 110*  --   --   --   BUN 24* 24* 18 17 24* 22*  CREATININE 1.29* 1.19* 0.97 1.0 1.2* 1.0  CALCIUM 7.6* 7.7* 7.7* 8.3* 9.2 8.6*  MG  --   --  2.6*  --   --   --    Liver Function Tests: Recent Labs    02/15/23 2058 02/19/23 0937 02/24/23 0000 04/09/23 0000 07/09/23 0000  AST 24 35 57* 21 17  ALT 21 13 41* 18 17  ALKPHOS 54 49 75 132* 66  BILITOT 0.5 0.9  --   --   --   PROT 7.0 5.7*  --   --   --   ALBUMIN 3.8 2.9* 3.5 4.1 4.0   No results for input(s): "LIPASE", "AMYLASE" in the last 8760 hours. No results for input(s): "AMMONIA" in the last 8760 hours. CBC: Recent Labs    02/17/23 0701 02/17/23 1319 02/18/23 0746 02/19/23 0937 02/24/23 0000 06/02/23 0000 07/09/23 0000  WBC 9.9  --  10.1 10.2 11.1 6.8 6.5  NEUTROABS  --   --   --   --  8,181.00 4,313.00 3,959.00  HGB 6.7*   < > 7.4* 9.2* 9.7* 11.8* 11.7*  HCT 20.4*   < > 22.7* 27.7* 29* 35* 34*  MCV 100.5*  --  99.6 95.8  --   --   --   PLT 155  --  130* 153 359 282 253   < > = values in this interval not displayed.   Cardiac Enzymes: No results for input(s): "CKTOTAL", "CKMB", "CKMBINDEX", "TROPONINI" in the last 8760 hours. BNP: Invalid input(s): "POCBNP" No results found for: "HGBA1C" Lab Results  Component Value Date   TSH 4.36 07/09/2023   Lab Results  Component Value Date   VITAMINB12 672 02/17/2023   Lab Results  Component Value Date   FOLATE 18.0 02/17/2023   Lab Results  Component Value Date   IRON 22 (L) 02/17/2023   TIBC 265 02/17/2023   FERRITIN 45 02/17/2023    Imaging  and Procedures obtained prior to SNF admission: DG FEMUR MIN 2 VIEWS LEFT Result Date: 02/16/2023 CLINICAL DATA:  Postoperative EXAM: LEFT FEMUR 2 VIEWS COMPARISON:  Left hip x-ray 02/15/2023 FINDINGS: There is a new left femoral intramedullary nail and hip screw fixating comminuted intratrochanteric fracture. Alignment is anatomic. The lesser trochanter is displaced medially. There is no dislocation. There is lateral hip and leg soft tissue swelling, air and skin staples compatible with recent surgery. IMPRESSION: Left femoral intramedullary nail and hip screw fixating comminuted intratrochanteric fracture. Electronically Signed   By: Darliss Cheney M.D.   On: 02/16/2023 21:17   DG FEMUR MIN 2 VIEWS LEFT Result Date: 02/16/2023 CLINICAL DATA:  Intramedullary nail EXAM: LEFT FEMUR 2 VIEWS COMPARISON:  Hip x-ray 02/15/2023 FINDINGS: Nine intraoperative fluoroscopic views of the left hip. Left-sided hip screw and intramedullary nail fixating intratrochanteric fracture. Alignment is anatomic. Fluoroscopy time: 2 minutes and 56 seconds. Fluoroscopy dose 45.564 micro Barot. IMPRESSION: Left hip screw and intramedullary nail fixating intratrochanteric fracture. Electronically Signed   By: Darliss Cheney M.D.   On: 02/16/2023 20:34   DG C-Arm 1-60 Min-No Report Result Date: 02/16/2023 Fluoroscopy was utilized by the requesting physician.  No radiographic interpretation.   DG C-Arm 1-60 Min-No Report Result Date: 02/16/2023 Fluoroscopy was utilized by the requesting physician.  No radiographic interpretation.   DG C-Arm 1-60 Min-No Report Result Date: 02/16/2023 Fluoroscopy was utilized by the requesting physician.  No radiographic interpretation.   DG Knee 1-2 Views Left Result Date: 02/16/2023 CLINICAL DATA:  Left knee pain EXAM: LEFT KNEE - 1-2 VIEW COMPARISON:  None Available. FINDINGS: Atypical frontal view due to knee being held in flexion. There is no obvious fracture involving the left knee on these atypical  views. There is tricompartment degenerative change and a probable trace effusion. There is soft tissue swelling anteriorly along the proximal tibia and prepatellar soft tissues. IMPRESSION: Atypical frontal view without obvious fracture involving the left knee. Tricompartment degenerative change with probable trace effusion. Soft tissue swelling anteriorly along the proximal lower leg and prepatellar soft tissues. Electronically Signed   By: Caprice Renshaw M.D.   On: 02/16/2023 16:51   DG Chest Portable 1 View Result Date: 02/15/2023 CLINICAL DATA:  Hip fracture. EXAM: PORTABLE CHEST 1 VIEW COMPARISON:  Chest radiograph dated May 22, 2007 FINDINGS: The heart is normal in size. Atherosclerotic calcification of the aortic arch. Low lung volumes with left basilar opacity suggesting atelectasis or small effusion. Thoracic spondylosis. No acute osseous abnormality. IMPRESSION: Low lung volumes with left basilar opacity suggesting atelectasis or small effusion. Electronically Signed   By: Larose Hires D.O.   On: 02/15/2023 22:07   DG Hip Unilat W or Wo Pelvis 2-3 Views Left Result Date: 02/15/2023 CLINICAL DATA:  Recent fall with hip pain, initial encounter EXAM: DG HIP (WITH OR WITHOUT PELVIS) 3V LEFT COMPARISON:  None  Available. FINDINGS: Pelvic ring appears intact. There is a significantly comminuted left intratrochanteric fracture with impaction and angulation at the fracture site. Femoral head is well seated. No soft tissue abnormality is noted. IMPRESSION: Severely comminuted left intratrochanteric fracture. Electronically Signed   By: Alcide Clever M.D.   On: 02/15/2023 22:05   CT HEAD WO CONTRAST ( ) Result Date: 02/15/2023 CLINICAL DATA:  Recent fall with headaches and neck pain, initial encounter EXAM: CT HEAD WITHOUT CONTRAST CT CERVICAL SPINE WITHOUT CONTRAST TECHNIQUE: Multidetector CT imaging of the head and cervical spine was performed following the standard protocol without intravenous contrast.  Multiplanar CT image reconstructions of the cervical spine were also generated. RADIATION DOSE REDUCTION: This exam was performed according to the departmental dose-optimization program which includes automated exposure control, adjustment of the mA and/or kV according to patient size and/or use of iterative reconstruction technique. COMPARISON:  05/21/2007 FINDINGS: CT HEAD FINDINGS Brain: No evidence of acute infarction, hemorrhage, hydrocephalus, extra-axial collection or mass lesion/mass effect. Progressive atrophic changes are noted commenced with the patient's given age. Chronic white matter ischemic changes are seen. Small lacunar infarct in the right basal ganglia is noted. Vascular: No hyperdense vessel or unexpected calcification. Skull: Normal. Negative for fracture or focal lesion. Sinuses/Orbits: No acute finding. Other: None CT CERVICAL SPINE FINDINGS Alignment: Mild loss of normal cervical lordosis is noted. Skull base and vertebrae: 7 cervical segments are well visualized. Vertebral body height is well maintained. Multilevel osteophytic changes are seen. Multilevel disc space narrowing and facet hypertrophic changes are noted. No acute fracture or acute facet abnormality is noted. Soft tissues and spinal canal: Surrounding soft tissue structures demonstrate diffuse vascular calcifications. No hematoma or other focal abnormality is noted. Upper chest: Visualized lung apices are within normal limits. Other: None IMPRESSION: CT of the head: Progressive atrophic and ischemic changes commensurate with the patient's given age. No acute abnormality noted. CT of the cervical spine: Multilevel degenerative change without acute abnormality. Electronically Signed   By: Alcide Clever M.D.   On: 02/15/2023 21:58   CT Cervical Spine Wo Contrast Result Date: 02/15/2023 CLINICAL DATA:  Recent fall with headaches and neck pain, initial encounter EXAM: CT HEAD WITHOUT CONTRAST CT CERVICAL SPINE WITHOUT CONTRAST  TECHNIQUE: Multidetector CT imaging of the head and cervical spine was performed following the standard protocol without intravenous contrast. Multiplanar CT image reconstructions of the cervical spine were also generated. RADIATION DOSE REDUCTION: This exam was performed according to the departmental dose-optimization program which includes automated exposure control, adjustment of the mA and/or kV according to patient size and/or use of iterative reconstruction technique. COMPARISON:  05/21/2007 FINDINGS: CT HEAD FINDINGS Brain: No evidence of acute infarction, hemorrhage, hydrocephalus, extra-axial collection or mass lesion/mass effect. Progressive atrophic changes are noted commenced with the patient's given age. Chronic white matter ischemic changes are seen. Small lacunar infarct in the right basal ganglia is noted. Vascular: No hyperdense vessel or unexpected calcification. Skull: Normal. Negative for fracture or focal lesion. Sinuses/Orbits: No acute finding. Other: None CT CERVICAL SPINE FINDINGS Alignment: Mild loss of normal cervical lordosis is noted. Skull base and vertebrae: 7 cervical segments are well visualized. Vertebral body height is well maintained. Multilevel osteophytic changes are seen. Multilevel disc space narrowing and facet hypertrophic changes are noted. No acute fracture or acute facet abnormality is noted. Soft tissues and spinal canal: Surrounding soft tissue structures demonstrate diffuse vascular calcifications. No hematoma or other focal abnormality is noted. Upper chest: Visualized lung apices are within normal limits.  Other: None IMPRESSION: CT of the head: Progressive atrophic and ischemic changes commensurate with the patient's given age. No acute abnormality noted. CT of the cervical spine: Multilevel degenerative change without acute abnormality. Electronically Signed   By: Alcide Clever M.D.   On: 02/15/2023 21:58    Assessment and Plan           1. Influenza  (Primary) Pt tested positive for flu  Will start them on droplet isolation  Will start tamiflu 30 mg bid x 5 days  Monitor vitals every shift   2.  cough Will start robitussin q6 prn for cough    3. Major neurocognitive disorder due to Alzheimer disease, with behavioral disturbance (HCC) Cont with supportive care  Assistance with ADLS   30 min Total time spent for obtaining history,  performing a medically appropriate examination and evaluation, reviewing the tests,   documenting clinical information in the electronic or other health record ,care coordination (not separately reported)

## 2024-02-04 ENCOUNTER — Ambulatory Visit: Payer: Medicare PPO | Admitting: Adult Health

## 2024-02-11 ENCOUNTER — Non-Acute Institutional Stay (SKILLED_NURSING_FACILITY): Payer: Self-pay | Admitting: Nurse Practitioner

## 2024-02-11 ENCOUNTER — Encounter: Payer: Self-pay | Admitting: Nurse Practitioner

## 2024-02-11 DIAGNOSIS — F02B3 Dementia in other diseases classified elsewhere, moderate, with mood disturbance: Secondary | ICD-10-CM

## 2024-02-11 DIAGNOSIS — E538 Deficiency of other specified B group vitamins: Secondary | ICD-10-CM | POA: Diagnosis not present

## 2024-02-11 DIAGNOSIS — I5032 Chronic diastolic (congestive) heart failure: Secondary | ICD-10-CM | POA: Diagnosis not present

## 2024-02-11 DIAGNOSIS — K5901 Slow transit constipation: Secondary | ICD-10-CM

## 2024-02-11 DIAGNOSIS — N1831 Chronic kidney disease, stage 3a: Secondary | ICD-10-CM | POA: Diagnosis not present

## 2024-02-11 DIAGNOSIS — G301 Alzheimer's disease with late onset: Secondary | ICD-10-CM | POA: Diagnosis not present

## 2024-02-11 NOTE — Assessment & Plan Note (Signed)
 takes Vit B12, Fe Hgb 11.5 01/05/24

## 2024-02-11 NOTE — Assessment & Plan Note (Signed)
 Compensated clinically, weight stable.  Hx of 01/08/2017, Grade I diastolic dysfunction 2018 per echocardiogram due to SOB. 08/18/22 Echo EF 75%, pulmonary hypertension, mild mitral/tricuspid valves insufficiency.

## 2024-02-11 NOTE — Progress Notes (Signed)
 Location:  [facility] Room Number: N026-A Place of Service:  SNF (31) Provider:  Parsa Rickett X, NP  Patient Care Team: Adriona Kaney X, NP as PCP - General (Internal Medicine) Romero Belling, MD as Referring Physician (Physical Medicine and Rehabilitation) Merlene Laughter, MD (Inactive) as Consulting Physician (Internal Medicine)  Extended Emergency Contact Information Primary Emergency Contact: Soyla Dryer, [place] Home Phone: [phone] Relation: Daughter Secondary Emergency Contact: Myers,Lois  [place] Home Phone: [phone] Relation: Daughter  Code Status:  DNR Goals of care: Advanced Directive information    02/11/2024    9:16 AM  Advanced Directives  Does Patient Have a Medical Advance Directive? Yes  Type of Estate agent of [place];Living will;Out of facility DNR (pink MOST or yellow form)  Does patient want to make changes to medical advance directive? No - Patient declined  Copy of Healthcare Power of Attorney in Chart? Yes - validated most recent copy scanned in chart (See row information)  Pre-existing out of facility DNR order (yellow form or pink MOST form) Yellow form placed in chart (order not valid for inpatient use);Pink MOST form placed in chart (order not valid for inpatient use)     Chief Complaint  Patient presents with  . Medical Management of Chronic Issues    Routine visit     HPI:  Pt is a 89 y.o. female seen today for medical management of chronic diseases.      Hospitalized 02/15/23-02/19/23 for left hip fx sustained from a ground fall, ORIF IM nail, healed. Last f/u 09/28/23             R hip pain, X-ray R hip no acute fx or dislocation The  patient's swelling ankles/BLE, mostly right, 08/04/22 negative DVT, BNP 54 07/09/23. Taking Furosemide. Bun/creat 22/1.0 08/25/23 Hx of grade I diastolic dysfunction 2018 per echocardiogram due to SOB, 08/18/22 EF  75%, pulmonary hypertension, mild tricuspid/mitral valve insufficiency, on Furosemide, weight has been stable, 07/09/23 BNP 54 Depression/anxiety, on Mirtazapine(07/08/23 HPOA declined GDR), Sertraline, Depakote, TSH 4.36 07/09/23. Hx of Hyperlipidemia, not taking statin, LDL 64 02/11/22, takes ASA 81mg  qd, refused statin. CKD Bun/creat 21/1.2 01/05/24 Vit B12 deficiency, takes Vit B12, Fe Hgb 11.5 01/05/24 Dementia, MMSE 19/30 12/22,  takes Exelon, Memantine, resides SNF [facility] for safety, care assistance. underwent Neurology consultation. Dyspnea, Hx of 01/08/2017, Grade I diastolic dysfunction 2018 per echocardiogram due to SOB. 08/18/22 Echo EF 75%, pulmonary hypertension, mild mitral/tricuspid valves insufficiency.    Constipation, stable, on MiraLax, Senokot.       Past Medical History:  Diagnosis Date  . BPV (benign positional vertigo)   . Cancer [facility])    uterine cancer  . Carpal tunnel syndrome on left 06/02/2017  . Cognitive changes   . Depression   . Droopy eyelid, right   . 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 Dyslipidemia   . Fibromyalgia   . Granuloma annulare   . History of uterine cancer 2003   treated with hysterectomy  . Lumbar radicular syndrome    left L5  . Macular degeneration    left eye  . Sciatica   . TIA (transient ischemic attack)    Past Surgical History:  Procedure Laterality Date  . ABDOMINAL HYSTERECTOMY    . BREAST BIOPSY  2007  . CATARACT EXTRACTION    . INTRAMEDULLARY (IM) NAIL INTERTROCHANTERIC Left 02/16/2023   Procedure: INTRAMEDULLARY (IM) NAIL INTERTROCHANTERIC;  Surgeon: London Sheer, MD;  Location: [facility];  Service: Orthopedics;  Laterality: Left;  . TONSILLECTOMY      Allergies  Allergen Reactions  . Aricept [Donepezil Hcl]     Muscle cramps  . Latex Itching  . Tramadol Other (See Comments)    Pt can't remember what side effects she had, she doesn't take it now    Outpatient Encounter Medications as of 02/11/2024  Medication Sig  .  acetaminophen (TYLENOL) 500 MG tablet Take 500 mg by mouth 2 (two) times daily.  Marland Kitchen aspirin EC 81 MG tablet Take 81 mg by mouth daily. Swallow whole.  . Calcium Carbonate-Vit D-Min (CALTRATE 600+D PLUS MINERALS) 600-800 MG-UNIT TABS Take 1 tablet by mouth daily.  . divalproex (DEPAKOTE) 125 MG DR tablet Take 125 mg by mouth 3 (three) times daily. UNSPECIFIED DEMENTIA, UNSPECIFIED SEVERITY, WITHOUT BEHAVIORAL DISTURBANCE, PSYCHOTIC DISTURBANCE, MOOD DISTURBANCE, AND ANXIETY (F03.90)  . FERROUS SULFATE PO Take 7.5 mLs by mouth every Monday, Wednesday, and Friday.  . furosemide (LASIX) 20 MG tablet Take 20 mg by mouth every Monday, Wednesday, and Friday. And Saturday  . guaiFENesin-dextromethorphan (ROBITUSSIN DM) 100-10 MG/5ML syrup Take 10 mLs by mouth every 6 (six) hours as needed for cough.  . lactose free nutrition (BOOST) LIQD Take 237 mLs by mouth as needed.  . melatonin 3 MG TABS tablet Take 3 mg by mouth at bedtime.  . memantine (NAMENDA) 5 MG tablet Take by mouth 2 (two) times daily.  . mirtazapine (REMERON) 30 MG tablet TAKE ONE TABLET AT BEDTIME.  . Multiple Vitamins-Minerals (SENTRY SENIOR) TABS Take 1 tablet by mouth daily.  . polyethylene glycol (MIRALAX) 17 g packet Take 17 g by mouth as needed.  . potassium chloride SA (KLOR-CON M) 20 MEQ tablet Take 20 mEq by mouth every Monday, Wednesday, and Friday. And Saturday  . rivastigmine (EXELON) 4.6 mg/24hr Place 4.6 mg onto the skin daily.  Marland Kitchen senna (SENOKOT) 8.6 MG TABS tablet Take 1 tablet (8.6 mg total) by mouth at bedtime.  . sertraline (ZOLOFT) 50 MG tablet Take 1 tablet (50 mg total) by mouth daily.  . vitamin B-12 (CYANOCOBALAMIN) 500 MCG tablet Take 500 mcg by mouth daily.  . Amino Acids-Protein Hydrolys (PRO-STAT) LIQD Take 30 mLs by mouth daily. (Patient not taking: Reported on 02/11/2024)   No facility-administered encounter medications on file as of 02/11/2024.    Review of Systems  Unable to perform ROS: Dementia     Immunization History  Administered Date(s) Administered  . Influenza, High Dose Seasonal PF 09/23/2017, 09/28/2019, 10/08/2023  . Influenza,inj,Quad PF,6+ Mos 09/16/2018  . Influenza-Unspecified 09/14/2017, 09/26/2020, 10/03/2021, 10/06/2022  . Moderna SARS-COV2 Booster Vaccination 10/23/2020, 05/14/2021  . Moderna Sars-Covid-2 Vaccination 12/19/2019, 01/16/2020, 05/14/2021  . Pneumococcal Conjugate-13 02/21/2014  . Pneumococcal Polysaccharide-23 02/21/2002  . Pneumococcal-Unspecified 12/15/2001  . Tdap 12/15/2009, 10/13/2022  . Unspecified SARS-COV-2 Vaccination 10/16/2022  . Zoster Recombinant(Shingrix) 01/12/2023, 04/15/2023  . Zoster, Unspecified 12/15/2005   Pertinent  Health Maintenance Due  Topic Date Due  . INFLUENZA VACCINE  Completed  . DEXA SCAN  Completed      12/29/2018    4:11 PM 02/08/2019    1:54 PM 03/07/2020   12:54 PM 02/20/2023    2:52 PM 05/26/2023    9:34 AM  Fall Risk  Falls in the past year? 0 0 0 1 0  Was there an injury with Fall? 0 0  1 0  Fall Risk Category Calculator 0 0  3 0  Fall Risk  Category (Retired) Low Low     (RETIRED) Patient Fall Risk Level Low fall risk Low fall risk     Patient at Risk for Falls Due to    History of fall(s) Impaired balance/gait  Fall risk Follow up    Falls evaluation completed Falls evaluation completed   Functional Status Survey:    Vitals:   02/11/24 0859  BP: (!) 141/71  Pulse: 75  Temp: 98.2 F (36.8 C)  Weight: 149 lb (67.6 kg)  Height: 5\' 1"  (1.549 m)   Body mass index is 28.15 kg/m. Physical Exam Vitals and nursing note reviewed.  Constitutional:      Appearance: Normal appearance.  HENT:     Head: Normocephalic and atraumatic.     Nose: Nose normal.     Mouth/Throat:     Mouth: Mucous membranes are moist.     Pharynx: No posterior oropharyngeal erythema.  Eyes:     Extraocular Movements: Extraocular movements intact.     Conjunctiva/sclera: Conjunctivae normal.     Pupils: Pupils are  equal, round, and reactive to light.  Cardiovascular:     Rate and Rhythm: Normal rate and regular rhythm.     Heart sounds: No murmur heard. Pulmonary:     Effort: Pulmonary effort is normal.     Breath sounds: No rales.  Abdominal:     General: Bowel sounds are normal.     Palpations: Abdomen is soft.     Tenderness: There is no abdominal tenderness.  Musculoskeletal:     Cervical back: Normal range of motion and neck supple.     Right lower leg: Edema present.     Left lower leg: Edema present.     Comments: Trace edema BLE  Skin:    General: Skin is warm and dry.     Comments: Left hip surgical incision healed.   Neurological:     General: No focal deficit present.     Mental Status: She is alert. Mental status is at baseline.     Gait: Gait abnormal.     Comments: Oriented to person  Psychiatric:     Comments: Anxious and combative when assistance with ADL offered.     Labs reviewed: Recent Labs    02/17/23 0352 02/18/23 0746 02/19/23 0937 02/24/23 0000 07/09/23 0000 08/25/23 0000 01/05/24 0000  NA 132* 134* 135   < > 140 139 138  K 4.3 3.6 3.5   < > 4.5 4.2 4.4  CL 103 103 106   < > 106 104 104  CO2 24 24 22    < > 18 26* 26*  GLUCOSE 157* 127* 110*  --   --   --   --   BUN 24* 24* 18   < > 24* 22* 21  CREATININE 1.29* 1.19* 0.97   < > 1.2* 1.0 1.2*  CALCIUM 7.6* 7.7* 7.7*   < > 9.2 8.6* 8.8  MG  --   --  2.6*  --   --   --   --    < > = values in this interval not displayed.   Recent Labs    02/19/23 0937 02/24/23 0000 04/09/23 0000 07/09/23 0000 01/05/24 0000  AST 35   < > 21 17 14   ALT 13   < > 18 17 11   ALKPHOS 49   < > 132* 66 46  BILITOT 0.9  --   --   --   --   PROT 5.7*  --   --   --   --  ALBUMIN 2.9*   < > 4.1 4.0 3.4*   < > = values in this interval not displayed.   Recent Labs    02/17/23 0701 02/17/23 1319 02/18/23 0746 02/19/23 0937 02/24/23 0000 12/22/23 0000 12/24/23 0000 01/05/24 0000  WBC 9.9  --  10.1 10.2   < > 6.6  6.6 5.4  NEUTROABS  --   --   --   --    < > 3,412.00 3,835.00 2,689.00  HGB 6.7*   < > 7.4* 9.2*   < > 11.1* 12.3 11.5*  HCT 20.4*   < > 22.7* 27.7*   < > 33* 37 34*  MCV 100.5*  --  99.6 95.8  --   --   --   --   PLT 155  --  130* 153   < > 230 260 221   < > = values in this interval not displayed.   Lab Results  Component Value Date   TSH 4.36 07/09/2023   No results found for: "HGBA1C" Lab Results  Component Value Date   CHOL 217 (A) 03/29/2021   HDL 48 03/29/2021   LDLCALC 152 03/29/2021   TRIG 70 03/29/2021   CHOLHDL 4.7 08/01/2019    Significant Diagnostic Results in last 30 days:  No results found.  Assessment/Plan Chronic diastolic CHF (congestive heart failure) (HCC) Compensated clinically, weight stable.  Hx of 01/08/2017, Grade I diastolic dysfunction 2018 per echocardiogram due to SOB. 08/18/22 Echo EF 75%, pulmonary hypertension, mild mitral/tricuspid valves insufficiency.     Slow transit constipation stable, on MiraLax, Senokot.   Moderate late onset Alzheimer's dementia with mood disturbance (HCC)  MMSE 19/30 12/22,  takes Exelon, Memantine, resides SNF [facility] for safety, care assistance. underwent Neurology consultation. on Mirtazapine(07/08/23 HPOA declined GDR), Sertraline, Depakote, TSH 4.36 07/09/23.  Vitamin B12 deficiency  takes Vit B12, Fe Hgb 11.5 01/05/24  Stage 3a chronic kidney disease (CKD) (HCC)  Bun/creat 21/1.2 01/05/24     Family/ staff Communication: plan of care reviewed with the patient and charge nurse.   Labs/tests ordered:  none

## 2024-02-11 NOTE — Assessment & Plan Note (Signed)
stable, on MiraLax, Senokot

## 2024-02-11 NOTE — Assessment & Plan Note (Signed)
 Bun/creat 21/1.2 01/05/24

## 2024-02-11 NOTE — Assessment & Plan Note (Signed)
 MMSE 19/30 12/22,  takes Exelon, Memantine, resides SNF [facility] for safety, care assistance. underwent Neurology consultation. on Mirtazapine(07/08/23 HPOA declined GDR), Sertraline, Depakote, TSH 4.36 07/09/23.

## 2024-02-16 ENCOUNTER — Encounter: Payer: Self-pay | Admitting: Nurse Practitioner

## 2024-02-22 ENCOUNTER — Encounter: Payer: Self-pay | Admitting: Sports Medicine

## 2024-02-22 ENCOUNTER — Non-Acute Institutional Stay (SKILLED_NURSING_FACILITY): Payer: Self-pay | Admitting: Sports Medicine

## 2024-02-22 DIAGNOSIS — F32A Depression, unspecified: Secondary | ICD-10-CM | POA: Diagnosis not present

## 2024-02-22 DIAGNOSIS — N1831 Chronic kidney disease, stage 3a: Secondary | ICD-10-CM

## 2024-02-22 DIAGNOSIS — M150 Primary generalized (osteo)arthritis: Secondary | ICD-10-CM

## 2024-02-22 DIAGNOSIS — F02818 Dementia in other diseases classified elsewhere, unspecified severity, with other behavioral disturbance: Secondary | ICD-10-CM | POA: Diagnosis not present

## 2024-02-22 DIAGNOSIS — F329 Major depressive disorder, single episode, unspecified: Secondary | ICD-10-CM

## 2024-02-22 DIAGNOSIS — F339 Major depressive disorder, recurrent, unspecified: Secondary | ICD-10-CM

## 2024-02-22 DIAGNOSIS — K5900 Constipation, unspecified: Secondary | ICD-10-CM

## 2024-02-22 DIAGNOSIS — I5032 Chronic diastolic (congestive) heart failure: Secondary | ICD-10-CM | POA: Diagnosis not present

## 2024-02-22 DIAGNOSIS — G309 Alzheimer's disease, unspecified: Secondary | ICD-10-CM | POA: Diagnosis not present

## 2024-02-22 NOTE — Progress Notes (Signed)
 Provider:  Dr. Venita Sheffield Location:  [facility] Place of Service:   Skilled care   PCP: Mast, Man X, NP Patient Care Team: Mast, Man X, NP as PCP - General (Internal Medicine) Romero Belling, MD as Referring Physician (Physical Medicine and Rehabilitation) Merlene Laughter, MD (Inactive) as Consulting Physician (Internal Medicine)  Extended Emergency Contact Information Primary Emergency Contact: Soyla Dryer, [place] Home Phone: [phone] Relation: Daughter Secondary Emergency Contact: Myers,Lois  [place] Home Phone: [phone] Relation: Daughter  Goals of Care: Advanced Directive information    02/11/2024    9:16 AM  Advanced Directives  Does Patient Have a Medical Advance Directive? Yes  Type of Estate agent of [place];Living will;Out of facility DNR (pink MOST or yellow form)  Does patient want to make changes to medical advance directive? No - Patient declined  Copy of Healthcare Power of Attorney in Chart? Yes - validated most recent copy scanned in chart (See row information)  Pre-existing out of facility DNR order (yellow form or pink MOST form) Yellow form placed in chart (order not valid for inpatient use);Pink MOST form placed in chart (order not valid for inpatient use)        History of Present Illness         89 year old female with a past medical history of dementia with behavioral problems, CKD, depression, hyperlipidemia, diastolic dysfunction is evaluated for chronic disease management.  Next patient seen and examined in the living room.  She seems pleasant and comfortable does not appear to be in distress She knows her name, cannot remember what she had for breakfast.  Not oriented to time or place. As per nursing staff patient is able to pivot and transfer from bed to chair but primarily uses wheelchair for ambulation.  She gets agitated with staff and  sometimes refuses care. She has good days and bad days. She is able to feed herself.  No coughing or choking noted as per nursing staff. Patient is a poor historian.  She answers most of the questions by one-word answers.  She is able to follow simple commands.   COMPREHENSIVE METABOLIC PANEL GLUCOSE 89 mg/dL 86-57 Final  Fasting reference interval UREA NITROGEN (BUN) 21 mg/dL 8-46 Final CREATININE 9.62 mg/dL 9.52-8.41 H Final EGFR 45 mL/min/1.73 m2 > OR = 60 L Final BUN/CREATININE RATIO 18 (calc) 6-22 Final SODIUM 138 mmol/L 135-146 Final POTASSIUM 4.4 mmol/L 3.5-5.3 Final CHLORIDE 104 mmol/L 98-110 Final CARBON DIOXIDE 26 mmol/L 20-32 Final CALCIUM 8.8 mg/dL 3.2-44.0 Final PROTEIN, TOTAL 5.8 g/dL 1.0-2.7 L Final ALBUMIN 3.4 g/dL 2.5-3.6 L Final GLOBULIN 2.4 g/dL (calc) 6.4-4.0 Final ALBUMIN/GLOBULIN RATIO 1.4 (calc) 1.0-2.5 Final BILIRUBIN, TOTAL 0.4 mg/dL 3.4-7.4 Final ALKALINE PHOSPHATASE 46 U/L 37-153 Final AST 14 U/L 10-35 Final ALT 11 U/L 6-29 Final   CBC (INCLUDES DIFF/PLT) WHITE BLOOD CELL COUNT 5.4 Thousand/u L 3.8-10.8 Final RED BLOOD CELL COUNT 3.57 Million/uL 3.80-5.10 L Final HEMOGLOBIN 11.5 g/dL 25.9-56.3 L Final HEMATOCRIT 33.6 % 35.0-45.0 L Final MCV 94.1 fL 80.0-100.0 Final MCH 32.2 pg 27.0-33.0 Final MCHC 34.2 g/dL 87.5-64.3 Final   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   Dyslipidemia  Fibromyalgia    Granuloma annulare    History of uterine cancer 2003   treated with hysterectomy   Lumbar radicular syndrome    left L5   Macular degeneration    left eye   Sciatica    TIA (transient ischemic attack)    Past Surgical History:  Procedure Laterality Date   ABDOMINAL HYSTERECTOMY     BREAST BIOPSY  2007   CATARACT EXTRACTION      INTRAMEDULLARY (IM) NAIL INTERTROCHANTERIC Left 02/16/2023   Procedure: INTRAMEDULLARY (IM) NAIL INTERTROCHANTERIC;  Surgeon: London Sheer, MD;  Location: [facility];  Service: Orthopedics;  Laterality: Left;   TONSILLECTOMY      reports that she has never smoked. She has never used smokeless tobacco. She reports that she does not drink alcohol and does not use drugs. Social History   Socioeconomic History   Marital status: Widowed    Spouse name: Not on file   Number of children: Not on file   Years of education: 16   Highest education level: Not on file  Occupational History   Occupation: Retired Acupuncturist  Tobacco Use   Smoking status: Never   Smokeless tobacco: Never  Vaping Use   Vaping status: Never Used  Substance and Sexual Activity   Alcohol use: No   Drug use: No   Sexual activity: Not on file  Other Topics Concern   Not on file  Social History Narrative   Lives at [facility]   Left-handed    Caffeine: tea once a day   Social Drivers of Health   Financial Resource Strain: Not on file  Food Insecurity: No Food Insecurity (02/15/2023)   Hunger Vital Sign    Worried About Running Out of Food in the Last Year: Never true    Ran Out of Food in the Last Year: Never true  Transportation Needs: No Transportation Needs (02/15/2023)   PRAPARE - Administrator, Civil Service (Medical): No    Lack of Transportation (Non-Medical): No  Physical Activity: Not on file  Stress: Not on file  Social Connections: Not on file  Intimate Partner Violence: Not At Risk (02/15/2023)   Humiliation, Afraid, Rape, and Kick questionnaire    Fear of Current or Ex-Partner: No    Emotionally Abused: No    Physically Abused: No    Sexually Abused: No    Functional Status Survey:    Family History  Problem Relation Age of Onset   Depression Mother    Diabetes Father    Heart attack Father    Dementia Sister    Pneumonia Brother     Breast cancer Daughter     Health Maintenance  Topic Date Due   COVID-19 Vaccine (7 - 2024-25 season) 12/15/2024 (Originally 08/16/2023)   Medicare Annual Wellness (AWV)  05/25/2024   DTaP/Tdap/Td (3 - Td or Tdap) 10/13/2032   Pneumonia Vaccine 68+ Years old  Completed   INFLUENZA VACCINE  Completed   DEXA SCAN  Completed   Zoster Vaccines- Shingrix  Completed   HPV VACCINES  Aged Out    Allergies  Allergen Reactions   Aricept [Donepezil Hcl]     Muscle cramps   Latex Itching   Tramadol Other (See Comments)    Pt can't remember what side effects she had, she doesn't take it now    Outpatient Encounter Medications as of 02/22/2024  Medication Sig   acetaminophen (TYLENOL) 500 MG tablet Take 500 mg by mouth  2 (two) times daily.   Amino Acids-Protein Hydrolys (PRO-STAT) LIQD Take 30 mLs by mouth daily. (Patient not taking: Reported on 02/11/2024)   aspirin EC 81 MG tablet Take 81 mg by mouth daily. Swallow whole.   Calcium Carbonate-Vit D-Min (CALTRATE 600+D PLUS MINERALS) 600-800 MG-UNIT TABS Take 1 tablet by mouth daily.   divalproex (DEPAKOTE) 125 MG DR tablet Take 125 mg by mouth 3 (three) times daily. UNSPECIFIED DEMENTIA, UNSPECIFIED SEVERITY, WITHOUT BEHAVIORAL DISTURBANCE, PSYCHOTIC DISTURBANCE, MOOD DISTURBANCE, AND ANXIETY (F03.90)   FERROUS SULFATE PO Take 7.5 mLs by mouth every Monday, Wednesday, and Friday.   furosemide (LASIX) 20 MG tablet Take 20 mg by mouth every Monday, Wednesday, and Friday. And Saturday   guaiFENesin-dextromethorphan (ROBITUSSIN DM) 100-10 MG/5ML syrup Take 10 mLs by mouth every 6 (six) hours as needed for cough.   lactose free nutrition (BOOST) LIQD Take 237 mLs by mouth as needed.   melatonin 3 MG TABS tablet Take 3 mg by mouth at bedtime.   memantine (NAMENDA) 5 MG tablet Take by mouth 2 (two) times daily.   mirtazapine (REMERON) 30 MG tablet TAKE ONE TABLET AT BEDTIME.   Multiple Vitamins-Minerals (SENTRY SENIOR) TABS Take 1 tablet by mouth  daily.   polyethylene glycol (MIRALAX) 17 g packet Take 17 g by mouth as needed.   potassium chloride SA (KLOR-CON M) 20 MEQ tablet Take 20 mEq by mouth every Monday, Wednesday, and Friday. And Saturday   rivastigmine (EXELON) 4.6 mg/24hr Place 4.6 mg onto the skin daily.   senna (SENOKOT) 8.6 MG TABS tablet Take 1 tablet (8.6 mg total) by mouth at bedtime.   sertraline (ZOLOFT) 50 MG tablet Take 1 tablet (50 mg total) by mouth daily.   vitamin B-12 (CYANOCOBALAMIN) 500 MCG tablet Take 500 mcg by mouth daily.   No facility-administered encounter medications on file as of 02/22/2024.    Review of Systems  Unable to perform ROS: Dementia  Constitutional:  Negative for fever.  Respiratory:  Negative for cough.   Cardiovascular:  Negative for leg swelling.  Gastrointestinal:  Negative for diarrhea.  Genitourinary:  Negative for dysuria.  Psychiatric/Behavioral:  Positive for agitation.    Negative unless indicated in HPI.  There were no vitals filed for this visit. There is no height or weight on file to calculate BMI. BP Readings from Last 3 Encounters:  02/11/24 (!) 141/71  02/01/24 130/70  01/01/24 122/70   Wt Readings from Last 3 Encounters:  02/11/24 149 lb (67.6 kg)  02/01/24 146 lb 4.8 oz (66.4 kg)  01/01/24 151 lb 8 oz (68.7 kg)   Physical Exam Constitutional:      Appearance: Normal appearance.  HENT:     Head: Normocephalic and atraumatic.  Cardiovascular:     Rate and Rhythm: Normal rate and regular rhythm.  Pulmonary:     Effort: Pulmonary effort is normal. No respiratory distress.     Breath sounds: Normal breath sounds. No wheezing.  Abdominal:     General: Bowel sounds are normal. There is no distension.     Tenderness: There is no abdominal tenderness. There is no guarding or rebound.     Comments:    Musculoskeletal:     Comments: Left foot - callus on left heel Staff reported that pt c/o pain when they try to put on her shoes  Neurological:     Mental  Status: She is alert. Mental status is at baseline.     Motor: No weakness.     Labs  reviewed: Basic Metabolic Panel: Recent Labs    07/09/23 0000 08/25/23 0000 01/05/24 0000  NA 140 139 138  K 4.5 4.2 4.4  CL 106 104 104  CO2 18 26* 26*  BUN 24* 22* 21  CREATININE 1.2* 1.0 1.2*  CALCIUM 9.2 8.6* 8.8   Liver Function Tests: Recent Labs    04/09/23 0000 07/09/23 0000 01/05/24 0000  AST 21 17 14   ALT 18 17 11   ALKPHOS 132* 66 46  ALBUMIN 4.1 4.0 3.4*   No results for input(s): "LIPASE", "AMYLASE" in the last 8760 hours. No results for input(s): "AMMONIA" in the last 8760 hours. CBC: Recent Labs    12/22/23 0000 12/24/23 0000 01/05/24 0000  WBC 6.6 6.6 5.4  NEUTROABS 3,412.00 3,835.00 2,689.00  HGB 11.1* 12.3 11.5*  HCT 33* 37 34*  PLT 230 260 221   Cardiac Enzymes: No results for input(s): "CKTOTAL", "CKMB", "CKMBINDEX", "TROPONINI" in the last 8760 hours. BNP: Invalid input(s): "POCBNP" No results found for: "HGBA1C" Lab Results  Component Value Date   TSH 4.36 07/09/2023   Lab Results  Component Value Date   VITAMINB12 672 02/17/2023   Lab Results  Component Value Date   FOLATE 18.0 02/17/2023   Lab Results  Component Value Date   IRON 22 (L) 02/17/2023   TIBC 265 02/17/2023   FERRITIN 45 02/17/2023    Imaging and Procedures obtained prior to SNF admission: DG FEMUR MIN 2 VIEWS LEFT Result Date: 02/16/2023 CLINICAL DATA:  Postoperative EXAM: LEFT FEMUR 2 VIEWS COMPARISON:  Left hip x-ray 02/15/2023 FINDINGS: There is a new left femoral intramedullary nail and hip screw fixating comminuted intratrochanteric fracture. Alignment is anatomic. The lesser trochanter is displaced medially. There is no dislocation. There is lateral hip and leg soft tissue swelling, air and skin staples compatible with recent surgery. IMPRESSION: Left femoral intramedullary nail and hip screw fixating comminuted intratrochanteric fracture. Electronically Signed   By: Darliss Cheney M.D.   On: 02/16/2023 21:17   DG FEMUR MIN 2 VIEWS LEFT Result Date: 02/16/2023 CLINICAL DATA:  Intramedullary nail EXAM: LEFT FEMUR 2 VIEWS COMPARISON:  Hip x-ray 02/15/2023 FINDINGS: Nine intraoperative fluoroscopic views of the left hip. Left-sided hip screw and intramedullary nail fixating intratrochanteric fracture. Alignment is anatomic. Fluoroscopy time: 2 minutes and 56 seconds. Fluoroscopy dose 45.564 micro Albino. IMPRESSION: Left hip screw and intramedullary nail fixating intratrochanteric fracture. Electronically Signed   By: Darliss Cheney M.D.   On: 02/16/2023 20:34   DG C-Arm 1-60 Min-No Report Result Date: 02/16/2023 Fluoroscopy was utilized by the requesting physician.  No radiographic interpretation.   DG C-Arm 1-60 Min-No Report Result Date: 02/16/2023 Fluoroscopy was utilized by the requesting physician.  No radiographic interpretation.   DG C-Arm 1-60 Min-No Report Result Date: 02/16/2023 Fluoroscopy was utilized by the requesting physician.  No radiographic interpretation.   DG Knee 1-2 Views Left Result Date: 02/16/2023 CLINICAL DATA:  Left knee pain EXAM: LEFT KNEE - 1-2 VIEW COMPARISON:  None Available. FINDINGS: Atypical frontal view due to knee being held in flexion. There is no obvious fracture involving the left knee on these atypical views. There is tricompartment degenerative change and a probable trace effusion. There is soft tissue swelling anteriorly along the proximal tibia and prepatellar soft tissues. IMPRESSION: Atypical frontal view without obvious fracture involving the left knee. Tricompartment degenerative change with probable trace effusion. Soft tissue swelling anteriorly along the proximal lower leg and prepatellar soft tissues. Electronically Signed   By: Caprice Renshaw  M.D.   On: 02/16/2023 16:51   DG Chest Portable 1 View Result Date: 02/15/2023 CLINICAL DATA:  Hip fracture. EXAM: PORTABLE CHEST 1 VIEW COMPARISON:  Chest radiograph dated May 22, 2007  FINDINGS: The heart is normal in size. Atherosclerotic calcification of the aortic arch. Low lung volumes with left basilar opacity suggesting atelectasis or small effusion. Thoracic spondylosis. No acute osseous abnormality. IMPRESSION: Low lung volumes with left basilar opacity suggesting atelectasis or small effusion. Electronically Signed   By: Larose Hires D.O.   On: 02/15/2023 22:07   DG Hip Unilat W or Wo Pelvis 2-3 Views Left Result Date: 02/15/2023 CLINICAL DATA:  Recent fall with hip pain, initial encounter EXAM: DG HIP (WITH OR WITHOUT PELVIS) 3V LEFT COMPARISON:  None Available. FINDINGS: Pelvic ring appears intact. There is a significantly comminuted left intratrochanteric fracture with impaction and angulation at the fracture site. Femoral head is well seated. No soft tissue abnormality is noted. IMPRESSION: Severely comminuted left intratrochanteric fracture. Electronically Signed   By: Alcide Clever M.D.   On: 02/15/2023 22:05   CT HEAD WO CONTRAST ( ) Result Date: 02/15/2023 CLINICAL DATA:  Recent fall with headaches and neck pain, initial encounter EXAM: CT HEAD WITHOUT CONTRAST CT CERVICAL SPINE WITHOUT CONTRAST TECHNIQUE: Multidetector CT imaging of the head and cervical spine was performed following the standard protocol without intravenous contrast. Multiplanar CT image reconstructions of the cervical spine were also generated. RADIATION DOSE REDUCTION: This exam was performed according to the departmental dose-optimization program which includes automated exposure control, adjustment of the mA and/or kV according to patient size and/or use of iterative reconstruction technique. COMPARISON:  05/21/2007 FINDINGS: CT HEAD FINDINGS Brain: No evidence of acute infarction, hemorrhage, hydrocephalus, extra-axial collection or mass lesion/mass effect. Progressive atrophic changes are noted commenced with the patient's given age. Chronic white matter ischemic changes are seen. Small lacunar  infarct in the right basal ganglia is noted. Vascular: No hyperdense vessel or unexpected calcification. Skull: Normal. Negative for fracture or focal lesion. Sinuses/Orbits: No acute finding. Other: None CT CERVICAL SPINE FINDINGS Alignment: Mild loss of normal cervical lordosis is noted. Skull base and vertebrae: 7 cervical segments are well visualized. Vertebral body height is well maintained. Multilevel osteophytic changes are seen. Multilevel disc space narrowing and facet hypertrophic changes are noted. No acute fracture or acute facet abnormality is noted. Soft tissues and spinal canal: Surrounding soft tissue structures demonstrate diffuse vascular calcifications. No hematoma or other focal abnormality is noted. Upper chest: Visualized lung apices are within normal limits. Other: None IMPRESSION: CT of the head: Progressive atrophic and ischemic changes commensurate with the patient's given age. No acute abnormality noted. CT of the cervical spine: Multilevel degenerative change without acute abnormality. Electronically Signed   By: Alcide Clever M.D.   On: 02/15/2023 21:58   CT Cervical Spine Wo Contrast Result Date: 02/15/2023 CLINICAL DATA:  Recent fall with headaches and neck pain, initial encounter EXAM: CT HEAD WITHOUT CONTRAST CT CERVICAL SPINE WITHOUT CONTRAST TECHNIQUE: Multidetector CT imaging of the head and cervical spine was performed following the standard protocol without intravenous contrast. Multiplanar CT image reconstructions of the cervical spine were also generated. RADIATION DOSE REDUCTION: This exam was performed according to the departmental dose-optimization program which includes automated exposure control, adjustment of the mA and/or kV according to patient size and/or use of iterative reconstruction technique. COMPARISON:  05/21/2007 FINDINGS: CT HEAD FINDINGS Brain: No evidence of acute infarction, hemorrhage, hydrocephalus, extra-axial collection or mass lesion/mass effect.  Progressive atrophic  changes are noted commenced with the patient's given age. Chronic white matter ischemic changes are seen. Small lacunar infarct in the right basal ganglia is noted. Vascular: No hyperdense vessel or unexpected calcification. Skull: Normal. Negative for fracture or focal lesion. Sinuses/Orbits: No acute finding. Other: NoMajor neurocognitive disorder with behavioral problems ne CT CERVICAL SPINE FINDINGS Alignment: Mild loss of normal cervical lordosis is noted. Skull base and vertebrae: 7 cervical segments are well visualized. Vertebral body height is well maintained. Multilevel osteophytic changes are seen. Multilevel disc space narrowing and facet hypertrophic changes are noted. No acute fracture or acute facet abnormality is noted. Soft tissues and spinal canal: Surrounding soft tissue structures demonstrate diffuse vascular calcifications. No hematoma or other focal abnormality is noted. Upper chest: Visualized lung apices are within normal limits. Other: None IMPRESSION: CT of the head: Progressive atrophic and ischemic changes commensurate with the patient's given age. No acute abnormality noted. CT of the cervical spine: Multilevel degenerative change without acute abnormality. Electronically Signed   By: Alcide Clever M.D.   On: 02/15/2023 21:58    Assessment and Plan     Major neurocognitive disorder with behavioral disturbances As per nursing staff patient has good days and bad days She gets agitated and sometimes uses care She is is present during the interview today Continue with Depakote, Namenda, rivastigmine Her weight is stable Continue with Cardene engaging activities Continue with supportive care  History of congestive heart failure Lungs clear Continue with Lasix, potassium supplements Avoid salty foods  Chronic kidney disease Creatinine 1.19 Avoid nephrotoxic medications  Anemia Hemoglobin 7.5 Continue with iron supplements No signs of  bleeding  Constipation Continue with the senna  Depression Continue with Zoloft  Osteoarthritis Continue Tylenol as needed for pain.       30 min Total time spent for obtaining history,  performing a medically appropriate examination and evaluation, reviewing the tests   documenting clinical information in the electronic or other health record,   ,care coordination (not separately reported)

## 2024-03-23 ENCOUNTER — Non-Acute Institutional Stay (SKILLED_NURSING_FACILITY): Payer: Self-pay | Admitting: Nurse Practitioner

## 2024-03-23 ENCOUNTER — Encounter: Payer: Self-pay | Admitting: Nurse Practitioner

## 2024-03-23 DIAGNOSIS — F0390 Unspecified dementia without behavioral disturbance: Secondary | ICD-10-CM

## 2024-03-23 DIAGNOSIS — F339 Major depressive disorder, recurrent, unspecified: Secondary | ICD-10-CM | POA: Diagnosis not present

## 2024-03-23 DIAGNOSIS — D509 Iron deficiency anemia, unspecified: Secondary | ICD-10-CM

## 2024-03-23 DIAGNOSIS — I5032 Chronic diastolic (congestive) heart failure: Secondary | ICD-10-CM | POA: Diagnosis not present

## 2024-03-23 DIAGNOSIS — N1831 Chronic kidney disease, stage 3a: Secondary | ICD-10-CM | POA: Diagnosis not present

## 2024-03-23 NOTE — Assessment & Plan Note (Signed)
MMSE 19/30 12/22,  takes Exelon, Memantine, resides SNF [facility] for safety, care assistance. underwent Neurology consultation.

## 2024-03-23 NOTE — Assessment & Plan Note (Signed)
 Hx of grade I diastolic dysfunction 2018 per echocardiogram due to SOB, 08/18/22 EF 75%, pulmonary hypertension, mild tricuspid/mitral valve insufficiency, on Furosemide, weight has been stable, 07/09/23 BNP 54

## 2024-03-23 NOTE — Assessment & Plan Note (Signed)
 Her mood is stable, on Mirtazapine(07/08/23 HPOA declined GDR), Sertraline, Depakote, TSH 4.36 07/09/23.

## 2024-03-23 NOTE — Assessment & Plan Note (Signed)
 Bun/creat 21/1.2 01/05/24

## 2024-03-23 NOTE — Progress Notes (Unsigned)
 Location:   [facility] Room Number: 66 Place of Service:  SNF (31) Provider: Arna Snipe Jalaiyah Throgmorton NP  Kalya Troeger X, NP  Patient Care Team: Jemar Paulsen X, NP as PCP - General (Internal Medicine) Romero Belling, MD as Referring Physician (Physical Medicine and Rehabilitation) Merlene Laughter, MD (Inactive) as Consulting Physician (Internal Medicine)  Extended Emergency Contact Information Primary Emergency Contact: Soyla Dryer, [place] Home Phone: [phone] Relation: Daughter Secondary Emergency Contact: Myers,Lois  [place] Home Phone: [phone] Relation: Daughter  Code Status:  DNR Goals of care: Advanced Directive information    02/11/2024    9:16 AM  Advanced Directives  Does Patient Have a Medical Advance Directive? Yes  Type of Estate agent of [place];Living will;Out of facility DNR (pink MOST or yellow form)  Does patient want to make changes to medical advance directive? No - Patient declined  Copy of Healthcare Power of Attorney in Chart? Yes - validated most recent copy scanned in chart (See row information)  Pre-existing out of facility DNR order (yellow form or pink MOST form) Yellow form placed in chart (order not valid for inpatient use);Pink MOST form placed in chart (order not valid for inpatient use)     Chief Complaint  Patient presents with  . Medical Management of Chronic Issues    HPI:  Pt is a 89 y.o. female seen today for medical management of chronic diseases.     Hospitalized 02/15/23-02/19/23 for left hip fx sustained from a ground fall, ORIF IM nail, healed. Last f/u 09/28/23             R hip pain, X-ray R hip no acute fx or dislocation The  patient's swelling ankles/BLE, mostly right, 08/04/22 negative DVT, BNP 54 07/09/23. Taking Furosemide. Bun/creat 22/1.0 08/25/23 Hx of grade I diastolic dysfunction 2018 per echocardiogram due to SOB, 08/18/22 EF 75%, pulmonary  hypertension, mild tricuspid/mitral valve insufficiency, on Furosemide, weight has been stable, 07/09/23 BNP 54 Depression/anxiety, on Mirtazapine(07/08/23 HPOA declined GDR), Sertraline, Depakote, TSH 4.36 07/09/23. Hx of Hyperlipidemia, not taking statin, LDL 64 02/11/22, takes ASA 81mg  qd, refused statin. CKD Bun/creat 21/1.2 01/05/24 Vit B12 deficiency, takes Vit B12, Fe Hgb 11.5 01/05/24 Dementia, MMSE 19/30 12/22,  takes Exelon, Memantine, resides SNF [facility] for safety, care assistance. underwent Neurology consultation. Dyspnea, Hx of 01/08/2017, Grade I diastolic dysfunction 2018 per echocardiogram due to SOB. 08/18/22 Echo EF 75%, pulmonary hypertension, mild mitral/tricuspid valves insufficiency.    Constipation, stable, on MiraLax, Senokot.    Past Medical History:  Diagnosis Date  . BPV (benign positional vertigo)   . Cancer [facility])    uterine cancer  . Carpal tunnel syndrome on left 06/02/2017  . Cognitive changes   . Depression   . Droopy eyelid, right   . 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 Dyslipidemia   . Fibromyalgia   . Granuloma annulare   . History of uterine cancer 2003   treated with hysterectomy  . Lumbar radicular syndrome    left L5  . Macular degeneration    left eye  . Sciatica   . TIA (transient ischemic attack)    Past Surgical History:  Procedure Laterality Date  . ABDOMINAL HYSTERECTOMY    . BREAST BIOPSY  2007  . CATARACT EXTRACTION    . INTRAMEDULLARY (IM) NAIL INTERTROCHANTERIC Left 02/16/2023   Procedure: INTRAMEDULLARY (IM) NAIL INTERTROCHANTERIC;  Surgeon: London Sheer, MD;  Location: [facility];  Service: Orthopedics;  Laterality: Left;  . TONSILLECTOMY      Allergies  Allergen Reactions  . Aricept [Donepezil Hcl]     Muscle cramps  . Latex Itching  . Tramadol Other (See Comments)    Pt can't remember what side effects she had, she doesn't take it now    Allergies as of 03/23/2024       Reactions   Aricept [donepezil Hcl]    Muscle cramps    Latex Itching   Tramadol Other (See Comments)   Pt can't remember what side effects she had, she doesn't take it now        Medication List        Accurate as of March 23, 2024 11:59 PM. If you have any questions, ask your nurse or doctor.          acetaminophen 500 MG tablet Commonly known as: TYLENOL Take 500 mg by mouth 2 (two) times daily.   aspirin EC 81 MG tablet Take 81 mg by mouth daily. Swallow whole.   Caltrate 600+D Plus Minerals 600-800 MG-UNIT Tabs Take 1 tablet by mouth daily.   divalproex 125 MG DR tablet Commonly known as: DEPAKOTE Take 125 mg by mouth 3 (three) times daily. UNSPECIFIED DEMENTIA, UNSPECIFIED SEVERITY, WITHOUT BEHAVIORAL DISTURBANCE, PSYCHOTIC DISTURBANCE, MOOD DISTURBANCE, AND ANXIETY (F03.90)   FERROUS SULFATE PO Take 7.5 mLs by mouth every Monday, Wednesday, and Friday.   furosemide 20 MG tablet Commonly known as: LASIX Take 20 mg by mouth every Monday, Wednesday, and Friday. And Saturday   guaiFENesin-dextromethorphan 100-10 MG/5ML syrup Commonly known as: ROBITUSSIN DM Take 10 mLs by mouth every 6 (six) hours as needed for cough.   lactose free nutrition Liqd Take 237 mLs by mouth as needed.   melatonin 3 MG Tabs tablet Take 3 mg by mouth at bedtime.   memantine 5 MG tablet Commonly known as: NAMENDA Take by mouth 2 (two) times daily.   MiraLax 17 g packet Generic drug: polyethylene glycol Take 17 g by mouth as needed.   mirtazapine 30 MG tablet Commonly known as: REMERON TAKE ONE TABLET AT BEDTIME.   potassium chloride SA 20 MEQ tablet Commonly known as: KLOR-CON M Take 20 mEq by mouth every Monday, Wednesday, and Friday. And Saturday   rivastigmine 4.6 mg/24hr Commonly known as: EXELON Place 4.6 mg onto the skin daily.   senna 8.6 MG Tabs tablet Commonly known as: SENOKOT Take 1 tablet (8.6 mg total) by mouth at bedtime.   Sentry Senior Tabs Take 1 tablet by mouth daily.   sertraline 50 MG  tablet Commonly known as: ZOLOFT Take 1 tablet (50 mg total) by mouth daily.   vitamin B-12 500 MCG tablet Commonly known as: CYANOCOBALAMIN Take 500 mcg by mouth daily.        Review of Systems  Unable to perform ROS: Dementia    Immunization History  Administered Date(s) Administered  . Influenza, High Dose Seasonal PF 09/23/2017, 09/28/2019, 10/08/2023  . Influenza,inj,Quad PF,6+ Mos 09/16/2018  . Influenza-Unspecified 09/14/2017, 09/26/2020, 10/03/2021, 10/06/2022  . Moderna SARS-COV2 Booster Vaccination 10/23/2020, 05/14/2021  . Moderna Sars-Covid-2 Vaccination 12/19/2019, 01/16/2020, 05/14/2021  . Pneumococcal Conjugate-13 02/21/2014  . Pneumococcal Polysaccharide-23 02/21/2002  . Pneumococcal-Unspecified 12/15/2001  . Tdap 12/15/2009, 10/13/2022  . Unspecified SARS-COV-2 Vaccination 10/16/2022  . Zoster Recombinant(Shingrix) 01/12/2023, 04/15/2023  . Zoster, Unspecified 12/15/2005   Pertinent  Health Maintenance Due  Topic Date Due  . INFLUENZA VACCINE  07/15/2024  . DEXA SCAN  Completed      12/29/2018    4:11 PM 02/08/2019    1:54 PM 03/07/2020   12:54 PM 02/20/2023    2:52 PM 05/26/2023    9:34 AM  Fall Risk  Falls in the past year? 0 0 0 1 0  Was there an injury with Fall? 0 0  1 0  Fall Risk Category Calculator 0 0  3 0  Fall Risk Category (Retired) Low Low     (RETIRED) Patient Fall Risk Level Low fall risk Low fall risk     Patient at Risk for Falls Due to    History of fall(s) Impaired balance/gait  Fall risk Follow up    Falls evaluation completed Falls evaluation completed   Functional Status Survey:    Vitals:   03/23/24 1447  BP: (!) 145/65  Pulse: 80  Resp: 16  Temp: (!) 97.2 F (36.2 C)  SpO2: 96%  Weight: 152 lb 4.8 oz (69.1 kg)   Body mass index is 28.78 kg/m. Physical Exam Vitals and nursing note reviewed.  Constitutional:      Appearance: Normal appearance.  HENT:     Head: Normocephalic and atraumatic.     Nose: Nose normal.      Mouth/Throat:     Mouth: Mucous membranes are moist.     Pharynx: No posterior oropharyngeal erythema.  Eyes:     Extraocular Movements: Extraocular movements intact.     Conjunctiva/sclera: Conjunctivae normal.     Pupils: Pupils are equal, round, and reactive to light.  Cardiovascular:     Rate and Rhythm: Normal rate and regular rhythm.     Heart sounds: No murmur heard. Pulmonary:     Effort: Pulmonary effort is normal.     Breath sounds: No rales.  Abdominal:     General: Bowel sounds are normal.     Palpations: Abdomen is soft.     Tenderness: There is no abdominal tenderness.  Musculoskeletal:     Cervical back: Normal range of motion and neck supple.     Right lower leg: No edema.     Left lower leg: No edema.  Skin:    General: Skin is warm and dry.     Comments: Left hip surgical incision healed.   Neurological:     General: No focal deficit present.     Mental Status: She is alert. Mental status is at baseline.     Gait: Gait abnormal.     Comments: Oriented to person  Psychiatric:     Comments: Anxious and combative when assistance with ADL offered.     Labs reviewed: Recent Labs    07/09/23 0000 08/25/23 0000 01/05/24 0000  NA 140 139 138  K 4.5 4.2 4.4  CL 106 104 104  CO2 18 26* 26*  BUN 24* 22* 21  CREATININE 1.2* 1.0 1.2*  CALCIUM 9.2 8.6* 8.8   Recent Labs    04/09/23 0000 07/09/23 0000 01/05/24 0000  AST 21 17 14   ALT 18 17 11   ALKPHOS 132* 66 46  ALBUMIN 4.1 4.0 3.4*   Recent Labs    12/22/23 0000 12/24/23 0000 01/05/24 0000  WBC 6.6 6.6 5.4  NEUTROABS 3,412.00 3,835.00 2,689.00  HGB 11.1* 12.3 11.5*  HCT 33* 37 34*  PLT 230 260 221   Lab Results  Component Value Date   TSH 4.36 07/09/2023   No results found for: "HGBA1C" Lab Results  Component Value Date   CHOL 217 (A) 03/29/2021  HDL 48 03/29/2021   LDLCALC 152 03/29/2021   TRIG 70 03/29/2021   CHOLHDL 4.7 08/01/2019    Significant Diagnostic Results in last  30 days:  No results found.  Assessment/Plan  Chronic diastolic CHF (congestive heart failure) (HCC) Hx of grade I diastolic dysfunction 2018 per echocardiogram due to SOB, 08/18/22 EF 75%, pulmonary hypertension, mild tricuspid/mitral valve insufficiency, on Furosemide, weight has been stable, 07/09/23 BNP 54  Depression, recurrent (HCC) Her mood is stable, on Mirtazapine(07/08/23 HPOA declined GDR), Sertraline, Depakote, TSH 4.36 07/09/23.  Stage 3a chronic kidney disease (CKD) (HCC) Bun/creat 21/1.2 01/05/24  Iron deficiency anemia  takes Vit B12, Fe Hgb 11.5 01/05/24  Senile dementia (HCC) MMSE 19/30 12/22,  takes Exelon, Memantine, resides SNF [facility] for safety, care assistance. underwent Neurology consultation.   Family/ staff Communication: plan of care reviewed with the patient and charge nurse.   Labs/tests ordered:  none

## 2024-03-23 NOTE — Assessment & Plan Note (Signed)
 takes Vit B12, Fe Hgb 11.5 01/05/24

## 2024-03-25 ENCOUNTER — Ambulatory Visit: Payer: Medicare PPO | Admitting: Orthopedic Surgery

## 2024-03-25 DIAGNOSIS — F03A0 Unspecified dementia, mild, without behavioral disturbance, psychotic disturbance, mood disturbance, and anxiety: Secondary | ICD-10-CM | POA: Diagnosis not present

## 2024-03-25 DIAGNOSIS — F330 Major depressive disorder, recurrent, mild: Secondary | ICD-10-CM | POA: Diagnosis not present

## 2024-03-31 ENCOUNTER — Ambulatory Visit: Admitting: Orthopedic Surgery

## 2024-04-28 DIAGNOSIS — F330 Major depressive disorder, recurrent, mild: Secondary | ICD-10-CM | POA: Diagnosis not present

## 2024-04-28 DIAGNOSIS — F03A0 Unspecified dementia, mild, without behavioral disturbance, psychotic disturbance, mood disturbance, and anxiety: Secondary | ICD-10-CM | POA: Diagnosis not present

## 2024-05-03 ENCOUNTER — Non-Acute Institutional Stay (SKILLED_NURSING_FACILITY): Payer: Self-pay | Admitting: Nurse Practitioner

## 2024-05-03 ENCOUNTER — Ambulatory Visit: Admitting: Orthopedic Surgery

## 2024-05-03 ENCOUNTER — Encounter: Payer: Self-pay | Admitting: Nurse Practitioner

## 2024-05-03 DIAGNOSIS — E538 Deficiency of other specified B group vitamins: Secondary | ICD-10-CM | POA: Diagnosis not present

## 2024-05-03 DIAGNOSIS — N1831 Chronic kidney disease, stage 3a: Secondary | ICD-10-CM | POA: Diagnosis not present

## 2024-05-03 DIAGNOSIS — I5032 Chronic diastolic (congestive) heart failure: Secondary | ICD-10-CM

## 2024-05-03 DIAGNOSIS — F0390 Unspecified dementia without behavioral disturbance: Secondary | ICD-10-CM

## 2024-05-03 NOTE — Assessment & Plan Note (Signed)
 Hx of grade I diastolic dysfunction 2018 per echocardiogram due to SOB, 08/18/22 EF 75%, pulmonary hypertension, mild tricuspid/mitral valve insufficiency, on Furosemide, weight has been stable, 07/09/23 BNP 54

## 2024-05-03 NOTE — Assessment & Plan Note (Signed)
MMSE 19/30 12/22,  takes Exelon, Memantine, resides SNF [facility] for safety, care assistance. underwent Neurology consultation.

## 2024-05-03 NOTE — Progress Notes (Signed)
 Location:   [facility] Room Number: 21 Place of Service:  SNF (31) Provider: Abner Hoffman Prentiss Polio NP  Myrissa Chipley X, NP  Patient Care Team: Lanah Steines X, NP as PCP - General (Internal Medicine) Christophe Cram, MD as Referring Physician (Physical Medicine and Rehabilitation) Alvie Ax, MD (Inactive) as Consulting Physician (Internal Medicine)  Extended Emergency Contact Information Primary Emergency Contact: Arlyss Laming, [place] Home Phone: [phone] Relation: Daughter Secondary Emergency Contact: Myers,Lois  [place] Home Phone: [phone] Relation: Daughter  Code Status:  DNR Goals of care: Advanced Directive information    02/11/2024    9:16 AM  Advanced Directives  Does Patient Have a Medical Advance Directive? Yes  Type of Estate agent of [place];Living will;Out of facility DNR (pink MOST or yellow form)  Does patient want to make changes to medical advance directive? No - Patient declined  Copy of Healthcare Power of Attorney in Chart? Yes - validated most recent copy scanned in chart (See row information)  Pre-existing out of facility DNR order (yellow form or pink MOST form) Yellow form placed in chart (order not valid for inpatient use);Pink MOST form placed in chart (order not valid for inpatient use)     Chief Complaint  Patient presents with   Medical Management of Chronic Issues    HPI:  Pt is a 89 y.o. female seen today for medical management of chronic diseases.    Hospitalized 02/15/23-02/19/23 for left hip fx sustained from a ground fall, ORIF IM nail, healed. Last f/u 09/28/23             R hip pain, X-ray R hip no acute fx or dislocation The  patient's swelling ankles/BLE, mostly right, 08/04/22 negative DVT, BNP 54 07/09/23. Taking Furosemide . Bun/creat 22/1.0 08/25/23 Hx of grade I diastolic dysfunction 2018 per echocardiogram due to SOB, 08/18/22 EF 75%, pulmonary  hypertension, mild tricuspid/mitral valve insufficiency, on Furosemide , weight has been stable, 07/09/23 BNP 54 Depression/anxiety, on Mirtazapine (07/08/23 HPOA declined GDR), Sertraline , Depakote, TSH 4.36 07/09/23. Hx of Hyperlipidemia, not taking statin, LDL 64 02/11/22, takes ASA 81mg  qd, refused statin. CKD Bun/creat 21/1.2 01/05/24 Vit B12 deficiency, takes Vit B12, Fe Hgb 11.5 01/05/24 Dementia, MMSE 19/30 12/22,  takes Exelon , Memantine , resides SNF [facility] for safety, care assistance. underwent Neurology consultation. Dyspnea, Hx of 01/08/2017, Grade I diastolic dysfunction 2018 per echocardiogram due to SOB. 08/18/22 Echo EF 75%, pulmonary hypertension, mild mitral/tricuspid valves insufficiency.    Constipation, stable, on MiraLax , Senokot.      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   Dyslipidemia    Fibromyalgia    Granuloma annulare    History of uterine cancer 2003   treated with hysterectomy   Lumbar radicular syndrome    left L5   Macular degeneration    left eye   Sciatica    TIA (transient ischemic attack)    Past Surgical History:  Procedure Laterality Date   ABDOMINAL HYSTERECTOMY     BREAST BIOPSY  2007   CATARACT EXTRACTION     INTRAMEDULLARY (IM) NAIL INTERTROCHANTERIC Left 02/16/2023   Procedure: INTRAMEDULLARY (IM) NAIL INTERTROCHANTERIC;  Surgeon: Diedra Fowler, MD;  Location: [facility];  Service: Orthopedics;  Laterality: Left;   TONSILLECTOMY      Allergies  Allergen Reactions   Aricept  [Donepezil  Hcl]     Muscle cramps   Latex Itching   Tramadol Other (See Comments)    Pt can't remember what side effects she had, she doesn't take it now    Allergies as of 05/03/2024       Reactions   Aricept  [donepezil  Hcl]    Muscle cramps   Latex Itching    Tramadol Other (See Comments)   Pt can't remember what side effects she had, she doesn't take it now        Medication List        Accurate as of May 03, 2024 11:59 PM. If you have any questions, ask your nurse or doctor.          acetaminophen  500 MG tablet Commonly known as: TYLENOL  Take 500 mg by mouth 2 (two) times daily.   aspirin  EC 81 MG tablet Take 81 mg by mouth daily. Swallow whole.   Caltrate 600+D Plus Minerals 600-800 MG-UNIT Tabs Take 1 tablet by mouth daily.   divalproex 125 MG DR tablet Commonly known as: DEPAKOTE Take 125 mg by mouth 3 (three) times daily. UNSPECIFIED DEMENTIA, UNSPECIFIED SEVERITY, WITHOUT BEHAVIORAL DISTURBANCE, PSYCHOTIC DISTURBANCE, MOOD DISTURBANCE, AND ANXIETY (F03.90)   FERROUS SULFATE PO Take 7.5 mLs by mouth every Monday, Wednesday, and Friday.   furosemide  20 MG tablet Commonly known as: LASIX  Take 20 mg by mouth every Monday, Wednesday, and Friday. And Saturday   guaiFENesin -dextromethorphan 100-10 MG/5ML syrup Commonly known as: ROBITUSSIN DM Take 10 mLs by mouth every 6 (six) hours as needed for cough.   lactose free nutrition Liqd Take 237 mLs by mouth as needed.   melatonin 3 MG Tabs tablet Take 3 mg by mouth at bedtime.   memantine  5 MG tablet Commonly known as: NAMENDA  Take by mouth 2 (two) times daily.   MiraLax  17 g packet Generic drug: polyethylene glycol Take 17 g by mouth as needed.   mirtazapine  30 MG tablet Commonly known as: REMERON  TAKE ONE TABLET AT BEDTIME.   potassium chloride SA 20 MEQ tablet Commonly known as: KLOR-CON M Take 20 mEq by mouth every Monday, Wednesday, and Friday. And Saturday   rivastigmine  4.6 mg/24hr Commonly known as: EXELON  Place 4.6 mg onto the skin daily.   senna 8.6 MG Tabs tablet Commonly known as: SENOKOT Take 1 tablet (8.6 mg total) by mouth at bedtime.   Sentry Senior Tabs Take 1 tablet by mouth daily.   sertraline  50 MG tablet Commonly known as:  ZOLOFT  Take 1 tablet (50 mg total) by mouth daily.   vitamin B-12 500 MCG tablet Commonly known as: CYANOCOBALAMIN Take 500 mcg by mouth daily.        Review of Systems  Unable to perform ROS: Dementia    Immunization History  Administered Date(s) Administered   Influenza, High Dose Seasonal PF 09/23/2017, 09/28/2019, 10/08/2023   Influenza,inj,Quad PF,6+ Mos 09/16/2018   Influenza-Unspecified 09/14/2017, 09/26/2020, 10/03/2021, 10/06/2022   Moderna SARS-COV2 Booster Vaccination 10/23/2020, 05/14/2021   Moderna Sars-Covid-2 Vaccination 12/19/2019, 01/16/2020, 05/14/2021   Pneumococcal Conjugate-13 02/21/2014   Pneumococcal Polysaccharide-23 02/21/2002   Pneumococcal-Unspecified 12/15/2001   Tdap 12/15/2009, 10/13/2022   Unspecified SARS-COV-2 Vaccination 10/16/2022   Zoster Recombinant(Shingrix) 01/12/2023, 04/15/2023   Zoster, Unspecified 12/15/2005   Pertinent  Health Maintenance Due  Topic Date Due   INFLUENZA VACCINE  07/15/2024   DEXA SCAN  Completed      12/29/2018    4:11 PM 02/08/2019    1:54 PM 03/07/2020   12:54 PM 02/20/2023    2:52 PM 05/26/2023    9:34 AM  Fall Risk  Falls in the past year? 0 0 0 1 0  Was there an injury with Fall? 0 0  1 0  Fall Risk Category Calculator 0 0  3 0  Fall Risk Category (Retired) Low Low     (RETIRED) Patient Fall Risk Level Low fall risk Low fall risk     Patient at Risk for Falls Due to    History of fall(s) Impaired balance/gait  Fall risk Follow up    Falls evaluation completed Falls evaluation completed   Functional Status Survey:    Vitals:   05/03/24 1342  BP: 137/69  Pulse: 71  Resp: 16  Temp: (!) 96.9 F (36.1 C)  SpO2: 95%  Weight: 154 lb 11.2 oz (70.2 kg)   Body mass index is 29.23 kg/m. Physical Exam Vitals and nursing note reviewed.  Constitutional:      Appearance: Normal appearance.  HENT:     Head: Normocephalic and atraumatic.     Nose: Nose normal.     Mouth/Throat:     Mouth: Mucous  membranes are moist.     Pharynx: No posterior oropharyngeal erythema.  Eyes:     Extraocular Movements: Extraocular movements intact.     Conjunctiva/sclera: Conjunctivae normal.     Pupils: Pupils are equal, round, and reactive to light.  Cardiovascular:     Rate and Rhythm: Normal rate and regular rhythm.     Heart sounds: No murmur heard. Pulmonary:     Effort: Pulmonary effort is normal.     Breath sounds: No rales.  Abdominal:     General: Bowel sounds are normal.     Palpations: Abdomen is soft.     Tenderness: There is no abdominal tenderness.  Musculoskeletal:     Cervical back: Normal range of motion and neck supple.     Right lower leg: No edema.     Left lower leg: No edema.  Skin:    General: Skin is warm and dry.     Comments: Left hip surgical incision healed.   Neurological:     General: No focal deficit present.     Mental Status: She is alert. Mental status is at baseline.     Gait: Gait abnormal.     Comments: Oriented to person  Psychiatric:     Comments: Anxious and combative when assistance with ADL offered.      Labs reviewed: Recent Labs    07/09/23 0000 08/25/23 0000 01/05/24 0000  NA 140 139 138  K 4.5 4.2 4.4  CL 106 104 104  CO2 18 26* 26*  BUN 24* 22* 21  CREATININE 1.2* 1.0 1.2*  CALCIUM 9.2 8.6* 8.8   Recent Labs    07/09/23 0000 01/05/24 0000  AST 17 14  ALT 17 11  ALKPHOS 66 46  ALBUMIN 4.0 3.4*   Recent Labs    12/22/23 0000 12/24/23 0000 01/05/24 0000  WBC 6.6 6.6 5.4  NEUTROABS 3,412.00 3,835.00 2,689.00  HGB 11.1* 12.3 11.5*  HCT 33* 37 34*  PLT 230 260 221   Lab Results  Component Value Date   TSH 4.36 07/09/2023   No results found for: "HGBA1C" Lab Results  Component Value Date   CHOL 217 (A) 03/29/2021   HDL 48 03/29/2021  LDLCALC 152 03/29/2021   TRIG 70 03/29/2021   CHOLHDL 4.7 08/01/2019    Significant Diagnostic Results in last 30 days:  No results found.  Assessment/Plan  Stage 3a  chronic kidney disease (CKD) (HCC)  Bun/creat 21/1.2 01/05/24  Vitamin B12 deficiency takes Vit B12, Fe Hgb 11.5 01/05/24  Senile dementia (HCC)  MMSE 19/30 12/22,  takes Exelon , Memantine , resides SNF [facility] for safety, care assistance. underwent Neurology consultation.  Chronic diastolic CHF (congestive heart failure) (HCC) Hx of grade I diastolic dysfunction 2018 per echocardiogram due to SOB, 08/18/22 EF 75%, pulmonary hypertension, mild tricuspid/mitral valve insufficiency, on Furosemide , weight has been stable, 07/09/23 BNP 54   Family/ staff Communication: plan of care reviewed with the patient and charge nurse.   Labs/tests ordered: none

## 2024-05-03 NOTE — Assessment & Plan Note (Signed)
 takes Vit B12, Fe Hgb 11.5 01/05/24

## 2024-05-03 NOTE — Assessment & Plan Note (Signed)
 Bun/creat 21/1.2 01/05/24

## 2024-05-05 ENCOUNTER — Ambulatory Visit: Admitting: Orthopedic Surgery

## 2024-05-12 ENCOUNTER — Ambulatory Visit: Admitting: Orthopedic Surgery

## 2024-05-16 ENCOUNTER — Non-Acute Institutional Stay (SKILLED_NURSING_FACILITY): Payer: Self-pay | Admitting: Sports Medicine

## 2024-05-16 DIAGNOSIS — F5101 Primary insomnia: Secondary | ICD-10-CM

## 2024-05-16 DIAGNOSIS — K5901 Slow transit constipation: Secondary | ICD-10-CM | POA: Diagnosis not present

## 2024-05-16 DIAGNOSIS — F339 Major depressive disorder, recurrent, unspecified: Secondary | ICD-10-CM | POA: Diagnosis not present

## 2024-05-16 DIAGNOSIS — G309 Alzheimer's disease, unspecified: Secondary | ICD-10-CM | POA: Diagnosis not present

## 2024-05-16 DIAGNOSIS — I509 Heart failure, unspecified: Secondary | ICD-10-CM

## 2024-05-16 DIAGNOSIS — D509 Iron deficiency anemia, unspecified: Secondary | ICD-10-CM | POA: Diagnosis not present

## 2024-05-16 DIAGNOSIS — F02818 Dementia in other diseases classified elsewhere, unspecified severity, with other behavioral disturbance: Secondary | ICD-10-CM

## 2024-05-16 NOTE — Progress Notes (Signed)
 Provider:  Dr. Tye Gall Location:  [facility] Place of Service:   skilled care  PCP: Mast, Man X, NP Patient Care Team: Mast, Man X, NP as PCP - General (Internal Medicine) Christophe Cram, MD as Referring Physician (Physical Medicine and Rehabilitation) Alvie Ax, MD (Inactive) as Consulting Physician (Internal Medicine)  Extended Emergency Contact Information Primary Emergency Contact: Arlyss L[place], [place] Home Phone: [phone] Relation: Daughter Secondary Emergency Contact: Myers,[place]ois  [place] Home Phone: [phone] Relation: Daughter  Goals of Care: Advanced Directive information    02/11/2024    9:16 AM  Advanced Directives  Does Patient Have a Medical Advance Directive? Yes  Type of Estate agent of [place];Living will;Out of facility DNR (pink MOST or yellow form)  Does patient want to make changes to medical advance directive? No - Patient declined  Copy of Healthcare Power of Attorney in Chart? Yes - validated most recent copy scanned in chart (See row information)  Pre-existing out of facility DNR order (yellow form or pink MOST form) Yellow form placed in chart (order not valid for inpatient use);Pink MOST form placed in chart (order not valid for inpatient use)             History of Present Illness   89 year old female with a history of major neurocognitive disorder, diastolic dysfunction, depression, CKD,Constipation is evaluated for the chronic disease management. Patient seen and examined in the living room.  She is sitting in the wheelchair watching TV.  She seems pleasant and comfortable and does not appear to be in distress. Patient knows her name, not oriented to time or place. She cannot remember what she had for lunch today. As per nursing staff she needs 1 person assistance with transferring.  She is wheelchair dependent. As per nursing  staff no agitation.  She can feed herself. Patient is a poor historian and majority of the history is obtained from the nursing staff. She is not able to comprehend the conversation.   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   Dyslipidemia    Fibromyalgia    Granuloma annulare    History of uterine cancer 2003   treated with hysterectomy   Lumbar radicular syndrome    left L5   Macular degeneration    left eye   Sciatica    TIA (transient ischemic attack)    Past Surgical History:  Procedure Laterality Date   ABDOMINAL HYSTERECTOMY     BREAST BIOPSY  2007   CATARACT EXTRACTION     INTRAMEDULLARY (IM) NAIL INTERTROCHANTERIC Left 02/16/2023   Procedure: INTRAMEDULLARY (IM) NAIL INTERTROCHANTERIC;  Surgeon: Diedra Fowler, MD;  Location: [facility];  Service: Orthopedics;  Laterality: Left;   TONSILLECTOMY      reports that she has never smoked. She has never used smokeless tobacco. She reports that she does not drink alcohol  and does not use drugs. Social History   Socioeconomic History   Marital status: Widowed    Spouse name: Not on file   Number of children: Not on file   Years of education: 16   Highest education level: Not on file  Occupational History  Occupation: Retired Acupuncturist  Tobacco Use   Smoking status: Never   Smokeless tobacco: Never  Vaping Use   Vaping status: Never Used  Substance and Sexual Activity   Alcohol  use: No   Drug use: No   Sexual activity: Not on file  Other Topics Concern   Not on file  Social History Narrative   Lives at [facility]   Left-handed    Caffeine: tea once a day   Social Drivers of Health   Financial Resource Strain: Not on file  Food Insecurity: No Food Insecurity  (02/15/2023)   Hunger Vital Sign    Worried About Running Out of Food in the Last Year: Never true    Ran Out of Food in the Last Year: Never true  Transportation Needs: No Transportation Needs (02/15/2023)   PRAPARE - Administrator, Civil Service (Medical): No    Lack of Transportation (Non-Medical): No  Physical Activity: Not on file  Stress: Not on file  Social Connections: Not on file  Intimate Partner Violence: Not At Risk (02/15/2023)   Humiliation, Afraid, Rape, and Kick questionnaire    Fear of Current or Ex-Partner: No    Emotionally Abused: No    Physically Abused: No    Sexually Abused: No    Functional Status Survey:    Family History  Problem Relation Age of Onset   Depression Mother    Diabetes Father    Heart attack Father    Dementia Sister    Pneumonia Brother    Breast cancer Daughter     Health Maintenance  Topic Date Due   Medicare Annual Wellness (AWV)  05/25/2024   COVID-19 Vaccine (7 - 2024-25 season) 12/15/2024 (Originally 08/16/2023)   INFLUENZA VACCINE  07/15/2024   DTaP/Tdap/Td (3 - Td or Tdap) 10/13/2032   Pneumonia Vaccine 71+ Years old  Completed   DEXA SCAN  Completed   Zoster Vaccines- Shingrix  Completed   HPV VACCINES  Aged Out   Meningococcal B Vaccine  Aged Out    Allergies  Allergen Reactions   Aricept  [Donepezil  Hcl]     Muscle cramps   Latex Itching   Tramadol Other (See Comments)    Pt can't remember what side effects she had, she doesn't take it now    Outpatient Encounter Medications as of 05/16/2024  Medication Sig   acetaminophen  (TYLENOL ) 500 MG tablet Take 500 mg by mouth 2 (two) times daily.   aspirin  EC 81 MG tablet Take 81 mg by mouth daily. Swallow whole.   Calcium Carbonate-Vit D-Min (CALTRATE 600+D PLUS MINERALS) 600-800 MG-UNIT TABS Take 1 tablet by mouth daily.   divalproex (DEPAKOTE) 125 MG DR tablet Take 125 mg by mouth 3 (three) times daily. UNSPECIFIED DEMENTIA, UNSPECIFIED SEVERITY, WITHOUT  BEHAVIORAL DISTURBANCE, PSYCHOTIC DISTURBANCE, MOOD DISTURBANCE, AND ANXIETY (F03.90)   FERROUS SULFATE PO Take 7.5 mLs by mouth every Monday, Wednesday, and Friday.   furosemide  (LASIX ) 20 MG tablet Take 20 mg by mouth every Monday, Wednesday, and Friday. And Saturday   guaiFENesin -dextromethorphan (ROBITUSSIN DM) 100-10 MG/5ML syrup Take 10 mLs by mouth every 6 (six) hours as needed for cough.   lactose free nutrition (BOOST) LIQD Take 237 mLs by mouth as needed.   melatonin 3 MG TABS tablet Take 3 mg by mouth at bedtime.   memantine  (NAMENDA ) 5 MG tablet Take by mouth 2 (two) times daily.   mirtazapine  (REMERON ) 30 MG tablet TAKE ONE TABLET AT BEDTIME.  Multiple Vitamins-Minerals (SENTRY SENIOR) TABS Take 1 tablet by mouth daily.   polyethylene glycol (MIRALAX ) 17 g packet Take 17 g by mouth as needed.   potassium chloride SA (KLOR-CON M) 20 MEQ tablet Take 20 mEq by mouth every Monday, Wednesday, and Friday. And Saturday   rivastigmine  (EXELON ) 4.6 mg/24hr Place 4.6 mg onto the skin daily.   senna (SENOKOT) 8.6 MG TABS tablet Take 1 tablet (8.6 mg total) by mouth at bedtime.   sertraline  (ZOLOFT ) 50 MG tablet Take 1 tablet (50 mg total) by mouth daily.   vitamin B-12 (CYANOCOBALAMIN) 500 MCG tablet Take 500 mcg by mouth daily.   No facility-administered encounter medications on file as of 05/16/2024.    Review of Systems  Unable to perform ROS: Dementia  Constitutional:  Negative for fever.  Respiratory:  Negative for cough and shortness of breath.   Gastrointestinal:  Negative for diarrhea and vomiting.  Genitourinary:  Negative for dysuria.   Negative unless indicated in HPI.  There were no vitals filed for this visit. There is no height or weight on file to calculate BMI. BP Readings from Last 3 Encounters:  05/03/24 137/69  03/23/24 (!) 145/65  02/22/24 (!) 115/59   Wt Readings from Last 3 Encounters:  05/03/24 154 lb 11.2 oz (70.2 kg)  03/23/24 152 lb 4.8 oz (69.1 kg)   02/22/24 151 lb 3.2 oz (68.6 kg)   Physical Exam Constitutional:      Appearance: Normal appearance.  HENT:     Head: Normocephalic and atraumatic.  Cardiovascular:     Rate and Rhythm: Normal rate and regular rhythm.  Pulmonary:     Effort: Pulmonary effort is normal. No respiratory distress.     Breath sounds: Normal breath sounds. No wheezing.  Abdominal:     General: Bowel sounds are normal. There is no distension.     Tenderness: There is no abdominal tenderness. There is no guarding or rebound.     Comments:    Musculoskeletal:        General: No swelling.  Neurological:     Mental Status: She is alert. Mental status is at baseline.     Motor: No weakness.     Labs reviewed: Basic Metabolic Panel: Recent Labs    07/09/23 0000 08/25/23 0000 01/05/24 0000  NA 140 139 138  K 4.5 4.2 4.4  CL 106 104 104  CO2 18 26* 26*  BUN 24* 22* 21  CREATININE 1.2* 1.0 1.2*  CALCIUM 9.2 8.6* 8.8   Liver Function Tests: Recent Labs    07/09/23 0000 01/05/24 0000  AST 17 14  ALT 17 11  ALKPHOS 66 46  ALBUMIN 4.0 3.4*   No results for input(s): "LIPASE", "AMYLASE" in the last 8760 hours. No results for input(s): "AMMONIA" in the last 8760 hours. CBC: Recent Labs    12/22/23 0000 12/24/23 0000 01/05/24 0000  WBC 6.6 6.6 5.4  NEUTROABS 3,412.00 3,835.00 2,689.00  HGB 11.1* 12.3 11.5*  HCT 33* 37 34*  PLT 230 260 221   Cardiac Enzymes: No results for input(s): "CKTOTAL", "CKMB", "CKMBINDEX", "TROPONINI" in the last 8760 hours. BNP: Invalid input(s): "POCBNP" No results found for: "HGBA1C" Lab Results  Component Value Date   TSH 4.36 07/09/2023   Lab Results  Component Value Date   VITAMINB12 672 02/17/2023   Lab Results  Component Value Date   FOLATE 18.0 02/17/2023   Lab Results  Component Value Date   IRON 22 (L) 02/17/2023   TIBC  265 02/17/2023   FERRITIN 45 02/17/2023    Imaging and Procedures obtained prior to SNF admission: DG FEMUR MIN 2  VIEWS LEFT Result Date: 02/16/2023 CLINICAL DATA:  Postoperative EXAM: LEFT FEMUR 2 VIEWS COMPARISON:  Left hip x-ray 02/15/2023 FINDINGS: There is a new left femoral intramedullary nail and hip screw fixating comminuted intratrochanteric fracture. Alignment is anatomic. The lesser trochanter is displaced medially. There is no dislocation. There is lateral hip and leg soft tissue swelling, air and skin staples compatible with recent surgery. IMPRESSION: Left femoral intramedullary nail and hip screw fixating comminuted intratrochanteric fracture. Electronically Signed   By: Tyron Gallon M.D.   On: 02/16/2023 21:17   DG FEMUR MIN 2 VIEWS LEFT Result Date: 02/16/2023 CLINICAL DATA:  Intramedullary nail EXAM: LEFT FEMUR 2 VIEWS COMPARISON:  Hip x-ray 02/15/2023 FINDINGS: Nine intraoperative fluoroscopic views of the left hip. Left-sided hip screw and intramedullary nail fixating intratrochanteric fracture. Alignment is anatomic. Fluoroscopy time: 2 minutes and 56 seconds. Fluoroscopy dose 45.564 micro Pingree. IMPRESSION: Left hip screw and intramedullary nail fixating intratrochanteric fracture. Electronically Signed   By: Tyron Gallon M.D.   On: 02/16/2023 20:34   DG C-Arm 1-60 Min-No Report Result Date: 02/16/2023 Fluoroscopy was utilized by the requesting physician.  No radiographic interpretation.   DG C-Arm 1-60 Min-No Report Result Date: 02/16/2023 Fluoroscopy was utilized by the requesting physician.  No radiographic interpretation.   DG C-Arm 1-60 Min-No Report Result Date: 02/16/2023 Fluoroscopy was utilized by the requesting physician.  No radiographic interpretation.   DG Knee 1-2 Views Left Result Date: 02/16/2023 CLINICAL DATA:  Left knee pain EXAM: LEFT KNEE - 1-2 VIEW COMPARISON:  None Available. FINDINGS: Atypical frontal view due to knee being held in flexion. There is no obvious fracture involving the left knee on these atypical views. There is tricompartment degenerative change and a  probable trace effusion. There is soft tissue swelling anteriorly along the proximal tibia and prepatellar soft tissues. IMPRESSION: Atypical frontal view without obvious fracture involving the left knee. Tricompartment degenerative change with probable trace effusion. Soft tissue swelling anteriorly along the proximal lower leg and prepatellar soft tissues. Electronically Signed   By: Jacob  Kahn M.D.   On: 02/16/2023 16:51   DG Chest Portable 1 View Result Date: 02/15/2023 CLINICAL DATA:  Hip fracture. EXAM: PORTABLE CHEST 1 VIEW COMPARISON:  Chest radiograph dated May 22, 2007 FINDINGS: The heart is normal in size. Atherosclerotic calcification of the aortic arch. Low lung volumes with left basilar opacity suggesting atelectasis or small effusion. Thoracic spondylosis. No acute osseous abnormality. IMPRESSION: Low lung volumes with left basilar opacity suggesting atelectasis or small effusion. Electronically Signed   By: Imran  Ahmed D.O.   On: 02/15/2023 22:07   DG Hip Unilat W or Wo Pelvis 2-3 Views Left Result Date: 02/15/2023 CLINICAL DATA:  Recent fall with hip pain, initial encounter EXAM: DG HIP (WITH OR WITHOUT PELVIS) 3V LEFT COMPARISON:  None Available. FINDINGS: Pelvic ring appears intact. There is a significantly comminuted left intratrochanteric fracture with impaction and angulation at the fracture site. Femoral head is well seated. No soft tissue abnormality is noted. IMPRESSION: Severely comminuted left intratrochanteric fracture. Electronically Signed   By: Violeta Grey M.D.   On: 02/15/2023 22:05   CT HEAD WO CONTRAST ( ) Result Date: 02/15/2023 CLINICAL DATA:  Recent fall with headaches and neck pain, initial encounter EXAM: CT HEAD WITHOUT CONTRAST CT CERVICAL SPINE WITHOUT CONTRAST TECHNIQUE: Multidetector CT imaging of the head and cervical spine was  performed following the standard protocol without intravenous contrast. Multiplanar CT image reconstructions of the cervical spine were  also generated. RADIATION DOSE REDUCTION: This exam was performed according to the departmental dose-optimization program which includes automated exposure control, adjustment of the mA and/or kV according to patient size and/or use of iterative reconstruction technique. COMPARISON:  05/21/2007 FINDINGS: CT HEAD FINDINGS Brain: No evidence of acute infarction, hemorrhage, hydrocephalus, extra-axial collection or mass lesion/mass effect. Progressive atrophic changes are noted commenced with the patient's given age. Chronic white matter ischemic changes are seen. Small lacunar infarct in the right basal ganglia is noted. Vascular: No hyperdense vessel or unexpected calcification. Skull: Normal. Negative for fracture or focal lesion. Sinuses/Orbits: No acute finding. Other: None CT CERVICAL SPINE FINDINGS Alignment: Mild loss of normal cervical lordosis is noted. Skull base and vertebrae: 7 cervical segments are well visualized. Vertebral body height is well maintained. Multilevel osteophytic changes are seen. Multilevel disc space narrowing and facet hypertrophic changes are noted. No acute fracture or acute facet abnormality is noted. Soft tissues and spinal canal: Surrounding soft tissue structures demonstrate diffuse vascular calcifications. No hematoma or other focal abnormality is noted. Upper chest: Visualized lung apices are within normal limits. Other: None IMPRESSION: CT of the head: Progressive atrophic and ischemic changes commensurate with the patient's given age. No acute abnormality noted. CT of the cervical spine: Multilevel degenerative change without acute abnormality. Electronically Signed   By: Violeta Grey M.D.   On: 02/15/2023 21:58   CT Cervical Spine Wo Contrast Result Date: 02/15/2023 CLINICAL DATA:  Recent fall with headaches and neck pain, initial encounter EXAM: CT HEAD WITHOUT CONTRAST CT CERVICAL SPINE WITHOUT CONTRAST TECHNIQUE: Multidetector CT imaging of the head and cervical spine  was performed following the standard protocol without intravenous contrast. Multiplanar CT image reconstructions of the cervical spine were also generated. RADIATION DOSE REDUCTION: This exam was performed according to the departmental dose-optimization program which includes automated exposure control, adjustment of the mA and/or kV according to patient size and/or use of iterative reconstruction technique. COMPARISON:  05/21/2007 FINDINGS: CT HEAD FINDINGS Brain: No evidence of acute infarction, hemorrhage, hydrocephalus, extra-axial collection or mass lesion/mass effect. Progressive atrophic changes are noted commenced with the patient's given age. Chronic white matter ischemic changes are seen. Small lacunar infarct in the right basal ganglia is noted. Vascular: No hyperdense vessel or unexpected calcification. Skull: Normal. Negative for fracture or focal lesion. Sinuses/Orbits: No acute finding. Other: None CT CERVICAL SPINE FINDINGS Alignment: Mild loss of normal cervical lordosis is noted. Skull base and vertebrae: 7 cervical segments are well visualized. Vertebral body height is well maintained. Multilevel osteophytic changes are seen. Multilevel disc space narrowing and facet hypertrophic changes are noted. No acute fracture or acute facet abnormality is noted. Soft tissues and spinal canal: Surrounding soft tissue structures demonstrate diffuse vascular calcifications. No hematoma or other focal abnormality is noted. Upper chest: Visualized lung apices are within normal limits. Other: None IMPRESSION: CT of the head: Progressive atrophic and ischemic changes commensurate with the patient's given age. No acute abnormality noted. CT of the cervical spine: Multilevel degenerative change without acute abnormality. Electronically Signed   By: Violeta Grey M.D.   On: 02/15/2023 21:58    Assessment and Plan Assessment & Plan   Major neurocognitive disorder with behavioral disturbances As per nursing  staff patient is one-person assist with ADLs No agitation recently Continue with Namenda , rivastigmine  Continue with Depakote Continue with supportive care Functional Assessment Staging Scale: 7a - Ability  to speak limited to approximately a half-dozen different intelligible words or fewer in an average day or the course of an intensive interview.   CHF Euvolemic on exam today Continue the Lasix   Iron deficiency anemia Continue with iron supplements  Depression Stable continue with Remeron , Zoloft   Insomnia Continue with melatonin  Constipation  continue with MiraLAX , senna   Osteoarthritis Continue Tylenol  as needed for pain.  30 min Total time spent for obtaining history,  performing a medically appropriate examination and evaluation, reviewing the tests, ,  documenting clinical information in the electronic or other health record,,care coordination (not separately reported)

## 2024-05-17 ENCOUNTER — Encounter: Payer: Self-pay | Admitting: Sports Medicine

## 2024-05-17 DIAGNOSIS — I10 Essential (primary) hypertension: Secondary | ICD-10-CM | POA: Diagnosis not present

## 2024-05-17 LAB — COMPREHENSIVE METABOLIC PANEL WITH GFR
Albumin: 3.7 (ref 3.5–5.0)
Calcium: 9.2 (ref 8.7–10.7)
Globulin: 2.4
eGFR: 42

## 2024-05-17 LAB — BASIC METABOLIC PANEL WITH GFR
BUN: 23 — AB (ref 4–21)
CO2: 28 — AB (ref 13–22)
Chloride: 105 (ref 99–108)
Creatinine: 1.2 — AB (ref 0.5–1.1)
Glucose: 99
Potassium: 4.2 meq/L (ref 3.5–5.1)
Sodium: 140 (ref 137–147)

## 2024-05-17 LAB — HEPATIC FUNCTION PANEL
ALT: 12 U/L (ref 7–35)
AST: 14 (ref 13–35)
Alkaline Phosphatase: 45 (ref 25–125)
Bilirubin, Total: 0.4

## 2024-05-17 LAB — CBC AND DIFFERENTIAL
HCT: 37 (ref 36–46)
Hemoglobin: 12 (ref 12.0–16.0)
Neutrophils Absolute: 3162
Platelets: 234 K/uL (ref 150–400)
WBC: 6.2

## 2024-05-17 LAB — CBC: RBC: 3.76 — AB (ref 3.87–5.11)

## 2024-05-25 DIAGNOSIS — F330 Major depressive disorder, recurrent, mild: Secondary | ICD-10-CM | POA: Diagnosis not present

## 2024-05-25 DIAGNOSIS — F03A0 Unspecified dementia, mild, without behavioral disturbance, psychotic disturbance, mood disturbance, and anxiety: Secondary | ICD-10-CM | POA: Diagnosis not present

## 2024-06-08 ENCOUNTER — Other Ambulatory Visit (INDEPENDENT_AMBULATORY_CARE_PROVIDER_SITE_OTHER): Payer: Self-pay

## 2024-06-08 ENCOUNTER — Ambulatory Visit (INDEPENDENT_AMBULATORY_CARE_PROVIDER_SITE_OTHER): Admitting: Orthopedic Surgery

## 2024-06-08 DIAGNOSIS — S72142A Displaced intertrochanteric fracture of left femur, initial encounter for closed fracture: Secondary | ICD-10-CM | POA: Diagnosis not present

## 2024-06-08 NOTE — Progress Notes (Signed)
 Orthopedic Surgery Post-operative Office Visit   Procedure: left hip cephalomedullary rodding Date of Surgery: 02/16/2023 (~1 year, 3 months post-op)   Assessment: Patient is a 89 y.o. who is doing well after surgery.  Not having any hip pain     Plan: -Activity as tolerated, no restrictions -Can use Tylenol  as needed for pain relief - Return to office on an as-needed basis   ___________________________________________________________________________     Subjective: Patient remains in an assistive living facility.  She has been working with physical therapy.  She is able to walk 100 feet with physical therapy.  She can walk to the shower on her own.  Per her daughter, she needs motivation in the right aids to get her to actually walk.  Most times she prefers to stay in the wheelchair.  She is not having any hip pain.  The patient daughter has not perceived any pain when she is up and walking.  The patient denies any pain in her hip.   Objective:   General: no acute distress, appropriate affect Neurologic: alert, answering questions intermittently, following commands Respiratory: unlabored breathing on room air Skin: incisions are well healed   MSK (LLE):               No pain with logroll, no pain with flexion of 100 degrees, no pain with internal or external rotation at the hip Plantarflexes and dorsiflexes toes EHL/TA/GSC intact Sensation intact to light touch in sural, saphenous, tibial, deep peroneal, and superficial peroneal nerve distributions Foot warm and well perfused, palpable DP pulse   Imaging: X-rays of the left femur taken 06/09/2023 were independently reviewed today, showing significant callus formation at the fracture site when compared to initial postoperative films.  There is bridging bone across the prior fracture.  No lucency seen around the lag screws or the distal interlocking screws.  No new fracture seen.  Alignment unchanged since prior films on 09/28/2023.      Patient name: Janet [name] Patient MRN: [id number] Date of visit: 06/08/24

## 2024-06-21 DIAGNOSIS — M79671 Pain in right foot: Secondary | ICD-10-CM | POA: Diagnosis not present

## 2024-06-21 DIAGNOSIS — L602 Onychogryphosis: Secondary | ICD-10-CM | POA: Diagnosis not present

## 2024-06-21 DIAGNOSIS — M79672 Pain in left foot: Secondary | ICD-10-CM | POA: Diagnosis not present

## 2024-06-28 ENCOUNTER — Encounter: Payer: Self-pay | Admitting: Nurse Practitioner

## 2024-06-28 ENCOUNTER — Non-Acute Institutional Stay (SKILLED_NURSING_FACILITY): Payer: Self-pay | Admitting: Nurse Practitioner

## 2024-06-28 DIAGNOSIS — D6489 Other specified anemias: Secondary | ICD-10-CM | POA: Diagnosis not present

## 2024-06-28 DIAGNOSIS — E782 Mixed hyperlipidemia: Secondary | ICD-10-CM

## 2024-06-28 DIAGNOSIS — R0600 Dyspnea, unspecified: Secondary | ICD-10-CM | POA: Diagnosis not present

## 2024-06-28 DIAGNOSIS — F0390 Unspecified dementia without behavioral disturbance: Secondary | ICD-10-CM | POA: Diagnosis not present

## 2024-06-28 DIAGNOSIS — F339 Major depressive disorder, recurrent, unspecified: Secondary | ICD-10-CM

## 2024-06-28 DIAGNOSIS — K5901 Slow transit constipation: Secondary | ICD-10-CM

## 2024-06-28 DIAGNOSIS — N1831 Chronic kidney disease, stage 3a: Secondary | ICD-10-CM

## 2024-06-28 DIAGNOSIS — I5032 Chronic diastolic (congestive) heart failure: Secondary | ICD-10-CM

## 2024-06-28 NOTE — Assessment & Plan Note (Signed)
 Occasional behavioral/emotional outburst due to lack of understanding of the surroundings. on Mirtazapine (07/08/23 HPOA declined GDR), Sertraline , Depakote, Lorazepam  ac showers, TSH 4.36 07/09/23.

## 2024-06-28 NOTE — Assessment & Plan Note (Signed)
not taking statin, LDL 64 02/11/22, takes ASA 81mg qd, refused statin. 

## 2024-06-28 NOTE — Assessment & Plan Note (Signed)
 Hx of grade I diastolic dysfunction 2018 per echocardiogram due to SOB, 08/18/22 EF 75%, pulmonary hypertension, mild tricuspid/mitral valve insufficiency, on Furosemide, weight has been stable, 07/09/23 BNP 54

## 2024-06-28 NOTE — Assessment & Plan Note (Signed)
 takes Vit B12, Fe,  Hgb 12.0 05/17/24

## 2024-06-28 NOTE — Assessment & Plan Note (Signed)
 Bun/creat 23/1.2 05/17/24

## 2024-06-28 NOTE — Assessment & Plan Note (Signed)
MMSE 19/30 12/22,  takes Exelon, Memantine, resides SNF [facility] for safety, care assistance. underwent Neurology consultation.

## 2024-06-28 NOTE — Assessment & Plan Note (Signed)
stable, on MiraLax, Senokot

## 2024-06-28 NOTE — Progress Notes (Signed)
 Location:  [facility] Room Number: [phone] A Place of Service:  SNF (31) Provider:  Keiana Tavella X, NP  Patient Care Team: Deloros Beretta X, NP as PCP - General (Internal Medicine) Margeret Kotyk, MD as Referring Physician (Physical Medicine and Rehabilitation) Roseann Coad, MD (Inactive) as Consulting Physician (Internal Medicine)  Extended Emergency Contact Information Primary Emergency Contact: Valli Landry Pers[place], [place] Home Phone: [phone] Relation: Daughter Secondary Emergency Contact: [place]  [place] Home Phone: [phone] Relation: Daughter  Code Status:DNR Goals of care: Advanced Directive information    02/11/2024    9:16 AM  Advanced Directives  Does Patient Have a Medical Advance Directive? Yes  Type of Estate agent of [place];Living will;Out of facility DNR (pink MOST or yellow form)  Does patient want to make changes to medical advance directive? No - Patient declined  Copy of Healthcare Power of Attorney in Chart? Yes - validated most recent copy scanned in chart (See row information)  Pre-existing out of facility DNR order (yellow form or pink MOST form) Yellow form placed in chart (order not valid for inpatient use);Pink MOST form placed in chart (order not valid for inpatient use)     Chief Complaint  Patient presents with   Medical Management of Chronic Issues    Routine visit. Discuss need for annual wellness visit or post pone if patient refuses or is not a candidate     HPI:  Pt is a 89 y.o. female seen today for medical management of chronic diseases.      Hospitalized 02/15/23-02/19/23 for left hip fx sustained from a ground fall, ORIF IM nail, healed.             R hip pain, X-ray R hip no acute fx or dislocation  Gait abnormality, w/c for mobility.   OA, takes Tylenol .  The  patient's swelling ankles/BLE, mostly right, 08/04/22 negative DVT, BNP 54  07/09/23. Taking Furosemide .  Hx of grade I diastolic dysfunction 2018 per echocardiogram due to SOB, 08/18/22 EF 75%, pulmonary hypertension, mild tricuspid/mitral valve insufficiency, on Furosemide , weight has been stable, 07/09/23 BNP 54 Depression/anxiety, on Mirtazapine (07/08/23 HPOA declined GDR), Sertraline , Depakote, Lorazepam  ac showers, TSH 4.36 07/09/23. Hx of Hyperlipidemia, not taking statin, LDL 64 02/11/22, takes ASA 81mg  qd, refused statin. CKD Bun/creat 23/1.2 05/17/24 Vit B12 deficiency, takes Vit B12, Fe,  Hgb 12.0 05/17/24 Dementia, MMSE 19/30 12/22,  takes Exelon , Memantine , resides SNF [facility] for safety, care assistance. underwent Neurology consultation. Dyspnea, Hx of 01/08/2017, Grade I diastolic dysfunction 2018 per echocardiogram due to SOB. 08/18/22 Echo EF 75%, pulmonary hypertension, mild mitral/tricuspid valves insufficiency.    Constipation, stable, on MiraLax , Senokot.      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   Dyslipidemia    Fibromyalgia    Granuloma annulare    History of uterine cancer 2003   treated with hysterectomy   Lumbar radicular syndrome    left L5   Macular degeneration    left eye   Sciatica    TIA (transient ischemic attack)    Past Surgical History:  Procedure Laterality Date   ABDOMINAL HYSTERECTOMY  BREAST BIOPSY  2007   CATARACT EXTRACTION     INTRAMEDULLARY (IM) NAIL INTERTROCHANTERIC Left 02/16/2023   Procedure: INTRAMEDULLARY (IM) NAIL INTERTROCHANTERIC;  Surgeon: Georgina Ozell LABOR, MD;  Location: [facility];  Service: Orthopedics;  Laterality: Left;   TONSILLECTOMY      Allergies  Allergen Reactions   Aricept  [Donepezil  Hcl]     Muscle cramps   Latex Itching   Tramadol Other (See Comments)    Pt can't remember what side effects she  had, she doesn't take it now    Outpatient Encounter Medications as of 06/28/2024  Medication Sig   acetaminophen  (TYLENOL ) 500 MG tablet Take 500 mg by mouth 2 (two) times daily.   aspirin  EC 81 MG tablet Take 81 mg by mouth daily. Swallow whole.   Calcium Carbonate-Vit D-Min (CALTRATE 600+D PLUS MINERALS) 600-800 MG-UNIT TABS Take 1 tablet by mouth daily.   divalproex (DEPAKOTE) 125 MG DR tablet Take 125 mg by mouth 3 (three) times daily. UNSPECIFIED DEMENTIA, UNSPECIFIED SEVERITY, WITHOUT BEHAVIORAL DISTURBANCE, PSYCHOTIC DISTURBANCE, MOOD DISTURBANCE, AND ANXIETY (F03.90)   FERROUS SULFATE PO Take 7.5 mLs by mouth every Monday, Wednesday, and Friday.   furosemide  (LASIX ) 20 MG tablet Take 20 mg by mouth every Monday, Wednesday, and Friday. And Saturday   guaiFENesin -dextromethorphan (ROBITUSSIN DM) 100-10 MG/5ML syrup Take 10 mLs by mouth every 6 (six) hours as needed for cough.   lactose free nutrition (BOOST) LIQD Take 237 mLs by mouth as needed.   melatonin 3 MG TABS tablet Take 3 mg by mouth at bedtime.   memantine  (NAMENDA ) 5 MG tablet Take by mouth 2 (two) times daily.   mirtazapine  (REMERON ) 30 MG tablet TAKE ONE TABLET AT BEDTIME.   Multiple Vitamins-Minerals (SENTRY SENIOR) TABS Take 1 tablet by mouth daily.   polyethylene glycol (MIRALAX ) 17 g packet Take 17 g by mouth as needed.   potassium chloride SA (KLOR-CON M) 20 MEQ tablet Take 20 mEq by mouth every Monday, Wednesday, and Friday. And Saturday   rivastigmine  (EXELON ) 4.6 mg/24hr Place 4.6 mg onto the skin daily.   senna (SENOKOT) 8.6 MG TABS tablet Take 1 tablet (8.6 mg total) by mouth at bedtime.   sertraline  (ZOLOFT ) 50 MG tablet Take 1 tablet (50 mg total) by mouth daily.   Sod Fluoride-Potassium Nitrate (SODIUM FLUORIDE 5000 ENAMEL DT) Place 1 Application onto teeth daily.   vitamin B-12 (CYANOCOBALAMIN) 500 MCG tablet Take 500 mcg by mouth daily.   No facility-administered encounter medications on file as of  06/28/2024.    Review of Systems  Unable to perform ROS: Dementia    Immunization History  Administered Date(s) Administered   Influenza, High Dose Seasonal PF 09/23/2017, 09/28/2019, 10/08/2023   Influenza,inj,Quad PF,6+ Mos 09/16/2018   Influenza-Unspecified 09/14/2017, 09/26/2020, 10/03/2021, 10/06/2022   Moderna SARS-COV2 Booster Vaccination 10/23/2020, 05/14/2021   Moderna Sars-Covid-2 Vaccination 12/19/2019, 01/16/2020, 05/14/2021   Pneumococcal Conjugate-13 02/21/2014   Pneumococcal Polysaccharide-23 02/21/2002   Pneumococcal-Unspecified 12/15/2001   Tdap 12/15/2009, 10/13/2022   Unspecified SARS-COV-2 Vaccination 10/16/2022   Zoster Recombinant(Shingrix) 01/12/2023, 04/15/2023   Zoster, Unspecified 12/15/2005   Pertinent  Health Maintenance Due  Topic Date Due   INFLUENZA VACCINE  07/15/2024   DEXA SCAN  Completed      12/29/2018    4:11 PM 02/08/2019    1:54 PM 03/07/2020   12:54 PM 02/20/2023    2:52 PM 05/26/2023    9:34 AM  Fall Risk  Falls in the past year? 0  0  0  1  0  Was there an injury with Fall? 0 0  1 0  Fall Risk Category Calculator 0 0  3 0  Fall Risk Category (Retired) Low  Low      (RETIRED) Patient Fall Risk Level Low fall risk  Low fall risk      Patient at Risk for Falls Due to    History of fall(s) Impaired balance/gait  Fall risk Follow up    Falls evaluation completed Falls evaluation completed     Data saved with a previous flowsheet row definition   Functional Status Survey:    Vitals:   06/28/24 1059 06/28/24 1110  BP: (!) 154/62 (!) 154/78  Pulse: 72   Weight: 149 lb 12.8 oz (67.9 kg)   Height: 5' 1 (1.549 m)    Body mass index is 28.3 kg/m. Physical Exam Vitals and nursing note reviewed.  Constitutional:      Appearance: Normal appearance.  HENT:     Head: Normocephalic and atraumatic.     Nose: Nose normal.     Mouth/Throat:     Mouth: Mucous membranes are moist.     Pharynx: No posterior oropharyngeal erythema.  Eyes:      Extraocular Movements: Extraocular movements intact.     Conjunctiva/sclera: Conjunctivae normal.     Pupils: Pupils are equal, round, and reactive to light.  Cardiovascular:     Rate and Rhythm: Normal rate and regular rhythm.     Heart sounds: No murmur heard. Pulmonary:     Effort: Pulmonary effort is normal.     Breath sounds: No rales.  Abdominal:     General: Bowel sounds are normal.     Palpations: Abdomen is soft.     Tenderness: There is no abdominal tenderness.  Musculoskeletal:     Cervical back: Normal range of motion and neck supple.     Right lower leg: Edema present.     Left lower leg: Edema present.     Comments: Trace edema BLE  Skin:    General: Skin is warm and dry.     Comments: Left hip surgical incision healed.   Neurological:     General: No focal deficit present.     Mental Status: She is alert. Mental status is at baseline.     Gait: Gait abnormal.     Comments: Oriented to person  Psychiatric:     Comments: Anxious and combative when assistance with ADL offered.      Labs reviewed: Recent Labs    08/25/23 0000 01/05/24 0000 05/17/24 0000  NA 139 138 140  K 4.2 4.4 4.2  CL 104 104 105  CO2 26* 26* 28*  BUN 22* 21 23*  CREATININE 1.0 1.2* 1.2*  CALCIUM 8.6* 8.8 9.2   Recent Labs    07/09/23 0000 01/05/24 0000 05/17/24 0000  AST 17 14 14   ALT 17 11 12   ALKPHOS 66 46 45  ALBUMIN 4.0 3.4* 3.7   Recent Labs    12/24/23 0000 01/05/24 0000 05/17/24 0000  WBC 6.6 5.4 6.2  NEUTROABS 3,835.00 2,689.00 3,162.00  HGB 12.3 11.5* 12.0  HCT 37 34* 37  PLT 260 221 234   Lab Results  Component Value Date   TSH 4.36 07/09/2023   No results found for: HGBA1C Lab Results  Component Value Date   CHOL 217 (A) 03/29/2021   HDL 48 03/29/2021   LDLCALC 152 03/29/2021   TRIG 70 03/29/2021   CHOLHDL 4.7 08/01/2019    Significant  Diagnostic Results in last 30 days:  XR FEMUR MIN 2 VIEWS LEFT Result Date: 06/08/2024 X-rays of the  left femur taken 06/09/2023 were independently reviewed today, showing significant callus formation at the fracture site when compared to initial postoperative films.  There is bridging bone across the prior fracture.  No lucency seen around the lag screws or the distal interlocking screws.  No new fracture seen.  Alignment unchanged since prior films on 09/28/2023.   Assessment/Plan Chronic diastolic CHF (congestive heart failure) (HCC) Hx of grade I diastolic dysfunction 2018 per echocardiogram due to SOB, 08/18/22 EF 75%, pulmonary hypertension, mild tricuspid/mitral valve insufficiency, on Furosemide , weight has been stable, 07/09/23 BNP 54  Depression, recurrent (HCC) Occasional behavioral/emotional outburst due to lack of understanding of the surroundings. on Mirtazapine (07/08/23 HPOA declined GDR), Sertraline , Depakote, Lorazepam  ac showers, TSH 4.36 07/09/23.  Hyperlipidemia not taking statin, LDL 64 02/11/22, takes ASA 81mg  qd, refused statin.  Stage 3a chronic kidney disease (CKD) (HCC) Bun/creat 23/1.2 05/17/24  Anemia due to multiple mechanisms  takes Vit B12, Fe,  Hgb 12.0 05/17/24  Major neurocognitive disorder (HCC) MMSE 19/30 12/22,  takes Exelon , Memantine , resides SNF [facility] for safety, care assistance. underwent Neurology consultation.  Dyspnea  Hx of 01/08/2017, Grade I diastolic dysfunction 2018 per echocardiogram due to SOB. 08/18/22 Echo EF 75%, pulmonary hypertension, mild mitral/tricuspid valves insufficiency.  Slow transit constipation stable, on MiraLax , Senokot.     Family/ staff Communication: Plan of care reviewed with the patient and charge nurse  Labs/tests ordered: None

## 2024-06-28 NOTE — Assessment & Plan Note (Signed)
Hx of 01/08/2017, Grade I diastolic dysfunction 2018 per echocardiogram due to SOB. 08/18/22 Echo EF 75%, pulmonary hypertension, mild mitral/tricuspid valves insufficiency.              

## 2024-07-05 DIAGNOSIS — F330 Major depressive disorder, recurrent, mild: Secondary | ICD-10-CM | POA: Diagnosis not present

## 2024-07-05 DIAGNOSIS — F03A0 Unspecified dementia, mild, without behavioral disturbance, psychotic disturbance, mood disturbance, and anxiety: Secondary | ICD-10-CM | POA: Diagnosis not present

## 2024-07-28 ENCOUNTER — Encounter: Payer: Self-pay | Admitting: Nurse Practitioner

## 2024-07-28 ENCOUNTER — Non-Acute Institutional Stay (SKILLED_NURSING_FACILITY): Payer: Self-pay | Admitting: Nurse Practitioner

## 2024-07-28 DIAGNOSIS — I5032 Chronic diastolic (congestive) heart failure: Secondary | ICD-10-CM

## 2024-07-28 DIAGNOSIS — F339 Major depressive disorder, recurrent, unspecified: Secondary | ICD-10-CM

## 2024-07-28 DIAGNOSIS — R269 Unspecified abnormalities of gait and mobility: Secondary | ICD-10-CM | POA: Insufficient documentation

## 2024-07-28 DIAGNOSIS — D6489 Other specified anemias: Secondary | ICD-10-CM | POA: Diagnosis not present

## 2024-07-28 DIAGNOSIS — F0390 Unspecified dementia without behavioral disturbance: Secondary | ICD-10-CM | POA: Diagnosis not present

## 2024-07-28 NOTE — Assessment & Plan Note (Signed)
 Compensated clinically The  patient's swelling ankles/BLE, mostly right, 08/04/22 negative DVT, BNP 54 07/09/23. Taking Furosemide .  Hx of grade I diastolic dysfunction 2018 per echocardiogram due to SOB, 08/18/22 EF 75%, pulmonary hypertension, mild tricuspid/mitral valve insufficiency, on Furosemide , weight has been stable, 07/09/23 BNP 54

## 2024-07-28 NOTE — Assessment & Plan Note (Signed)
 takes Vit B12, Fe,  Hgb 12.0 05/17/24

## 2024-07-28 NOTE — Progress Notes (Signed)
 Location:  [facility] Room Number: [phone] A Place of Service:  SNF (31) Provider:  Jaylin Benzel X, NP  Patient Care Team: Elvis Boot X, NP as PCP - General (Internal Medicine) Margeret Kotyk, MD as Referring Physician (Physical Medicine and Rehabilitation) Roseann Coad, MD (Inactive) as Consulting Physician (Internal Medicine)  Extended Emergency Contact Information Primary Emergency Contact: Valli Landry Persons, [place] Home Phone: [phone] Relation: Daughter Secondary Emergency Contact: Myers,Lois  [place] Home Phone: [phone] Relation: Daughter  Code Status:  DNR Goals of care: Advanced Directive information    02/11/2024    9:16 AM  Advanced Directives  Does Patient Have a Medical Advance Directive? Yes  Type of Estate agent of [place];Living will;Out of facility DNR (pink MOST or yellow form)  Does patient want to make changes to medical advance directive? No - Patient declined  Copy of Healthcare Power of Attorney in Chart? Yes - validated most recent copy scanned in chart (See row information)  Pre-existing out of facility DNR order (yellow form or pink MOST form) Yellow form placed in chart (order not valid for inpatient use);Pink MOST form placed in chart (order not valid for inpatient use)     Chief Complaint  Patient presents with   Medical Management of Chronic Issues    Routine visit. Discuss need for annual wellness visit     HPI:  Pt is a 89 y.o. female seen today for medical management of chronic diseases.    Hospitalized 02/15/23-02/19/23 for left hip fx sustained from a ground fall, ORIF IM nail, healed.             R hip pain, X-ray R hip no acute fx or dislocation             Gait abnormality, w/c for mobility.              OA, takes Tylenol .  The  patient's swelling ankles/BLE, mostly right, 08/04/22 negative DVT, BNP 54 07/09/23. Taking Furosemide .  Hx of  grade I diastolic dysfunction 2018 per echocardiogram due to SOB, 08/18/22 EF 75%, pulmonary hypertension, mild tricuspid/mitral valve insufficiency, on Furosemide , weight has been stable, 07/09/23 BNP 54 Depression/anxiety, on Mirtazapine (07/08/23 HPOA declined GDR), Sertraline , Depakote, Lorazepam  ac showers, TSH 4.36 07/09/23. Hx of Hyperlipidemia, not taking statin, LDL 64 02/11/22, takes ASA 81mg  qd, refused statin. CKD Bun/creat 23/1.2 05/17/24 Vit B12 deficiency, takes Vit B12, Fe,  Hgb 12.0 05/17/24 Dementia, MMSE 19/30 12/22,  takes Exelon , Memantine , resides SNF [facility] for safety, care assistance. underwent Neurology consultation. Dyspnea, Hx of 01/08/2017, Grade I diastolic dysfunction 2018 per echocardiogram due to SOB. 08/18/22 Echo EF 75%, pulmonary hypertension, mild mitral/tricuspid valves insufficiency.    Constipation, stable, on MiraLax , Senokot.    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   Dyslipidemia    Fibromyalgia    Granuloma annulare    History of uterine cancer 2003   treated with hysterectomy   Lumbar radicular syndrome    left L5   Macular degeneration    left eye   Sciatica    TIA (transient ischemic attack)    Past Surgical History:  Procedure  Laterality Date   ABDOMINAL HYSTERECTOMY     BREAST BIOPSY  2007   CATARACT EXTRACTION     INTRAMEDULLARY (IM) NAIL INTERTROCHANTERIC Left 02/16/2023   Procedure: INTRAMEDULLARY (IM) NAIL INTERTROCHANTERIC;  Surgeon: Georgina Ozell LABOR, MD;  Location: [facility];  Service: Orthopedics;  Laterality: Left;   TONSILLECTOMY      Allergies  Allergen Reactions   Aricept  [Donepezil  Hcl]     Muscle cramps   Latex Itching   Tramadol Other (See Comments)    Pt can't remember what side effects she had, she doesn't take it now     Outpatient Encounter Medications as of 07/28/2024  Medication Sig   acetaminophen  (TYLENOL ) 500 MG tablet Take 500 mg by mouth 2 (two) times daily.   aspirin  EC 81 MG tablet Take 81 mg by mouth daily. Swallow whole.   Calcium Carbonate-Vit D-Min (CALTRATE 600+D PLUS MINERALS) 600-800 MG-UNIT TABS Take 1 tablet by mouth daily.   divalproex (DEPAKOTE) 125 MG DR tablet Take 125 mg by mouth 3 (three) times daily. UNSPECIFIED DEMENTIA, UNSPECIFIED SEVERITY, WITHOUT BEHAVIORAL DISTURBANCE, PSYCHOTIC DISTURBANCE, MOOD DISTURBANCE, AND ANXIETY (F03.90)   FERROUS SULFATE PO Take 7.5 mLs by mouth every Monday, Wednesday, and Friday.   furosemide  (LASIX ) 20 MG tablet Take 20 mg by mouth every Monday, Wednesday, and Friday. And Saturday   LORazepam  (ATIVAN ) 0.5 MG tablet Take 0.5 mg by mouth as directed. On Tuesday and Friday for agitation on shower days, hold for drowsiness   melatonin 3 MG TABS tablet Take 3 mg by mouth at bedtime.   memantine  (NAMENDA ) 5 MG tablet Take by mouth 2 (two) times daily.   mirtazapine  (REMERON ) 30 MG tablet TAKE ONE TABLET AT BEDTIME.   potassium chloride SA (KLOR-CON M) 20 MEQ tablet Take 20 mEq by mouth every Monday, Wednesday, and Friday. And Saturday   rivastigmine  (EXELON ) 4.6 mg/24hr Place 4.6 mg onto the skin daily.   senna (SENOKOT) 8.6 MG TABS tablet Take 1 tablet (8.6 mg total) by mouth at bedtime.   sertraline  (ZOLOFT ) 50 MG tablet Take 1 tablet (50 mg total) by mouth daily.   Sod Fluoride-Potassium Nitrate (SODIUM FLUORIDE 5000 ENAMEL DT) Place 1 Application onto teeth daily.   vitamin B-12 (CYANOCOBALAMIN) 500 MCG tablet Take 500 mcg by mouth daily.   guaiFENesin -dextromethorphan (ROBITUSSIN DM) 100-10 MG/5ML syrup Take 10 mLs by mouth every 6 (six) hours as needed for cough.   lactose free nutrition (BOOST) LIQD Take 237 mLs by mouth as needed.   polyethylene glycol (MIRALAX ) 17 g packet Take 17 g by mouth as needed.   [DISCONTINUED] Multiple  Vitamins-Minerals (SENTRY SENIOR) TABS Take 1 tablet by mouth daily. (Patient not taking: Reported on 07/28/2024)   No facility-administered encounter medications on file as of 07/28/2024.    Review of Systems  Unable to perform ROS: Dementia    Immunization History  Administered Date(s) Administered   Influenza, High Dose Seasonal PF 09/23/2017, 09/28/2019, 10/08/2023   Influenza,inj,Quad PF,6+ Mos 09/16/2018   Influenza-Unspecified 09/14/2017, 09/26/2020, 10/03/2021, 10/06/2022   Moderna SARS-COV2 Booster Vaccination 10/23/2020, 05/14/2021   Moderna Sars-Covid-2 Vaccination 12/19/2019, 01/16/2020, 05/14/2021   Pneumococcal Conjugate-13 02/21/2014   Pneumococcal Polysaccharide-23 02/21/2002   Pneumococcal-Unspecified 12/15/2001   Tdap 12/15/2009, 10/13/2022   Unspecified SARS-COV-2 Vaccination 10/16/2022   Zoster Recombinant(Shingrix) 01/12/2023, 04/15/2023   Zoster, Unspecified 12/15/2005   Pertinent  Health Maintenance Due  Topic Date Due   INFLUENZA VACCINE  09/14/2024 (Originally 07/15/2024)   DEXA SCAN  Completed  12/29/2018    4:11 PM 02/08/2019    1:54 PM 03/07/2020   12:54 PM 02/20/2023    2:52 PM 05/26/2023    9:34 AM  Fall Risk  Falls in the past year? 0  0  0  1 0  Was there an injury with Fall? 0 0  1 0  Fall Risk Category Calculator 0 0  3 0  Fall Risk Category (Retired) Low  Low      (RETIRED) Patient Fall Risk Level Low fall risk  Low fall risk      Patient at Risk for Falls Due to    History of fall(s) Impaired balance/gait  Fall risk Follow up    Falls evaluation completed Falls evaluation completed     Data saved with a previous flowsheet row definition   Functional Status Survey:    Vitals:   07/28/24 0858  BP: 124/72  Pulse: 68  SpO2: 98%  Weight: 150 lb (68 kg)  Height: 5' 1 (1.549 m)   Body mass index is 28.34 kg/m. Physical Exam Vitals and nursing note reviewed.  Constitutional:      Appearance: Normal appearance.  HENT:     Head:  Normocephalic and atraumatic.     Nose: Nose normal.     Mouth/Throat:     Mouth: Mucous membranes are moist.     Pharynx: No posterior oropharyngeal erythema.  Eyes:     Extraocular Movements: Extraocular movements intact.     Conjunctiva/sclera: Conjunctivae normal.     Pupils: Pupils are equal, round, and reactive to light.  Cardiovascular:     Rate and Rhythm: Normal rate and regular rhythm.     Heart sounds: No murmur heard. Pulmonary:     Effort: Pulmonary effort is normal.     Breath sounds: No rales.  Abdominal:     General: Bowel sounds are normal.     Palpations: Abdomen is soft.     Tenderness: There is no abdominal tenderness.  Musculoskeletal:     Cervical back: Normal range of motion and neck supple.     Right lower leg: Edema present.     Left lower leg: Edema present.     Comments: Trace edema BLE  Skin:    General: Skin is warm and dry.     Comments: Left hip surgical incision healed.   Neurological:     General: No focal deficit present.     Mental Status: She is alert. Mental status is at baseline.     Gait: Gait abnormal.     Comments: Oriented to person  Psychiatric:     Comments: Anxious and combative when assistance with ADL offered.      Labs reviewed: Recent Labs    08/25/23 0000 01/05/24 0000 05/17/24 0000  NA 139 138 140  K 4.2 4.4 4.2  CL 104 104 105  CO2 26* 26* 28*  BUN 22* 21 23*  CREATININE 1.0 1.2* 1.2*  CALCIUM 8.6* 8.8 9.2   Recent Labs    01/05/24 0000 05/17/24 0000  AST 14 14  ALT 11 12  ALKPHOS 46 45  ALBUMIN 3.4* 3.7   Recent Labs    12/24/23 0000 01/05/24 0000 05/17/24 0000  WBC 6.6 5.4 6.2  NEUTROABS 3,835.00 2,689.00 3,162.00  HGB 12.3 11.5* 12.0  HCT 37 34* 37  PLT 260 221 234   Lab Results  Component Value Date   TSH 4.36 07/09/2023   No results found for: HGBA1C Lab Results  Component  Value Date   CHOL 217 (A) 03/29/2021   HDL 48 03/29/2021   LDLCALC 152 03/29/2021   TRIG 70 03/29/2021    CHOLHDL 4.7 08/01/2019    Significant Diagnostic Results in last 30 days:  No results found.  Assessment/Plan Chronic diastolic CHF (congestive heart failure) (HCC) Compensated clinically The  patient's swelling ankles/BLE, mostly right, 08/04/22 negative DVT, BNP 54 07/09/23. Taking Furosemide .  Hx of grade I diastolic dysfunction 2018 per echocardiogram due to SOB, 08/18/22 EF 75%, pulmonary hypertension, mild tricuspid/mitral valve insufficiency, on Furosemide , weight has been stable, 07/09/23 BNP 54  Depression, recurrent (HCC) Stable on Mirtazapine (07/08/23 HPOA declined GDR), Sertraline , Depakote, Lorazepam  ac showers, TSH 4.36 07/09/23.  Anemia due to multiple mechanisms takes Vit B12, Fe,  Hgb 12.0 05/17/24  Major neurocognitive disorder (HCC) takes Vit B12, Fe,  Hgb 12.0 05/17/24  Gait abnormality Wheelchair for mobility, no pain complained today   Family/ staff Communication: Plan of care reviewed with the patient and charge nurse  Labs/tests ordered: None

## 2024-07-28 NOTE — Assessment & Plan Note (Signed)
 Wheelchair for mobility, no pain complained today

## 2024-07-28 NOTE — Assessment & Plan Note (Signed)
 Stable on Mirtazapine (07/08/23 HPOA declined GDR), Sertraline , Depakote, Lorazepam  ac showers, TSH 4.36 07/09/23.

## 2024-07-29 ENCOUNTER — Encounter: Payer: Self-pay | Admitting: Nurse Practitioner

## 2024-08-02 DIAGNOSIS — F330 Major depressive disorder, recurrent, mild: Secondary | ICD-10-CM | POA: Diagnosis not present

## 2024-08-02 DIAGNOSIS — F03A0 Unspecified dementia, mild, without behavioral disturbance, psychotic disturbance, mood disturbance, and anxiety: Secondary | ICD-10-CM | POA: Diagnosis not present

## 2024-08-22 ENCOUNTER — Non-Acute Institutional Stay (SKILLED_NURSING_FACILITY): Payer: Self-pay | Admitting: Sports Medicine

## 2024-08-22 DIAGNOSIS — F5101 Primary insomnia: Secondary | ICD-10-CM | POA: Diagnosis not present

## 2024-08-22 DIAGNOSIS — F339 Major depressive disorder, recurrent, unspecified: Secondary | ICD-10-CM

## 2024-08-22 DIAGNOSIS — F02818 Dementia in other diseases classified elsewhere, unspecified severity, with other behavioral disturbance: Secondary | ICD-10-CM

## 2024-08-22 DIAGNOSIS — G309 Alzheimer's disease, unspecified: Secondary | ICD-10-CM | POA: Diagnosis not present

## 2024-08-22 DIAGNOSIS — I5032 Chronic diastolic (congestive) heart failure: Secondary | ICD-10-CM | POA: Diagnosis not present

## 2024-08-22 DIAGNOSIS — K5900 Constipation, unspecified: Secondary | ICD-10-CM

## 2024-08-22 NOTE — Progress Notes (Unsigned)
 Provider:  Dr. Jackalyn Blazing Location:  [facility] Place of Service:   Skilled care   PCP: Mast, Man X, NP Patient Care Team: Mast, Man X, NP as PCP - General (Internal Medicine) Margeret Kotyk, MD as Referring Physician (Physical Medicine and Rehabilitation) Roseann Coad, MD (Inactive) as Consulting Physician (Internal Medicine)  Extended Emergency Contact Information Primary Emergency Contact: Valli Landry Persons, [place] Home Phone: [phone] Relation: Daughter Secondary Emergency Contact: Myers,Lois  [place] Home Phone: [phone] Relation: Daughter  Goals of Care: Advanced Directive information    02/11/2024    9:16 AM  Advanced Directives  Does Patient Have a Medical Advance Directive? Yes  Type of Estate agent of [place];Living will;Out of facility DNR (pink MOST or yellow form)  Does patient want to make changes to medical advance directive? No - Patient declined  Copy of Healthcare Power of Attorney in Chart? Yes - validated most recent copy scanned in chart (See row information)  Pre-existing out of facility DNR order (yellow form or pink MOST form) Yellow form placed in chart (order not valid for inpatient use);Pink MOST form placed in chart (order not valid for inpatient use)        History of Present Illness   89 yr old F with h/o  OA, diastolic dysfunction, depression, GAD, CKD, Dementia, constipation is here for chronic disease management. Pt seen and examined in the living room, seems pleasant during the interview As per nursing staff pt gets agitated and refuses care She is wheel chair dependent    08/22/2024 13:22 147.2 Lbs (Wheelchair) 08/22/2024 10:27 147.2 Lbs (Wheelchair) 08/21/2024 07:36 147.2 Lbs (Wheelchair) 08/20/2024 09:43 147.8 Lbs (Wheelchair) 08/19/2024 09:31 147.9 Lbs (Wheelchair) 08/19/2024 07:30 147.9 Lbs (Wheelchair) 08/18/2024 09:20 147.9  Lbs (Wheelchair) 08/15/2024 17:59 147.9 Lbs (Wheelchair) 08/15/2024 13:10 147.9 Lbs (Wheelchair  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   Dyslipidemia    Fibromyalgia    Granuloma annulare    History of uterine cancer 2003   treated with hysterectomy   Lumbar radicular syndrome    left L5   Macular degeneration    left eye   Sciatica    TIA (transient ischemic attack)    Past Surgical History:  Procedure Laterality Date   ABDOMINAL HYSTERECTOMY     BREAST BIOPSY  2007   CATARACT EXTRACTION     INTRAMEDULLARY (IM) NAIL INTERTROCHANTERIC Left 02/16/2023   Procedure: INTRAMEDULLARY (IM) NAIL INTERTROCHANTERIC;  Surgeon: Georgina Ozell LABOR, MD;  Location: [facility];  Service: Orthopedics;  Laterality: Left;   TONSILLECTOMY      reports that she has never smoked. She has never used smokeless tobacco. She reports that she does not drink alcohol  and does not use drugs. Social History   Socioeconomic History   Marital status: Widowed    Spouse name: Not on file   Number of children: Not on file   Years of education: 16   Highest education level: Not on file  Occupational History   Occupation: Retired Acupuncturist  Tobacco Use   Smoking status: Never   Smokeless tobacco: Never  Vaping Use   Vaping status: Never Used  Substance and  Sexual Activity   Alcohol  use: No   Drug use: No   Sexual activity: Not on file  Other Topics Concern   Not on file  Social History Narrative   Lives at [facility]   Left-handed    Caffeine: tea once a day   Social Drivers of Health   Financial Resource Strain: Not on file  Food Insecurity: No Food Insecurity (02/15/2023)   Hunger Vital Sign    Worried About Running Out of Food in the Last Year: Never true     Ran Out of Food in the Last Year: Never true  Transportation Needs: No Transportation Needs (02/15/2023)   PRAPARE - Administrator, Civil Service (Medical): No    Lack of Transportation (Non-Medical): No  Physical Activity: Not on file  Stress: Not on file  Social Connections: Not on file  Intimate Partner Violence: Not At Risk (02/15/2023)   Humiliation, Afraid, Rape, and Kick questionnaire    Fear of Current or Ex-Partner: No    Emotionally Abused: No    Physically Abused: No    Sexually Abused: No    Functional Status Survey:    Family History  Problem Relation Age of Onset   Depression Mother    Diabetes Father    Heart attack Father    Dementia Sister    Pneumonia Brother    Breast cancer Daughter     Health Maintenance  Topic Date Due   Medicare Annual Wellness (AWV)  05/25/2024   COVID-19 Vaccine (7 - 2025-26 season) 08/15/2024   Influenza Vaccine  09/14/2024 (Originally 07/15/2024)   DTaP/Tdap/Td (3 - Td or Tdap) 10/13/2032   Pneumococcal Vaccine: 50+ Years  Completed   DEXA SCAN  Completed   Zoster Vaccines- Shingrix  Completed   HPV VACCINES  Aged Out   Meningococcal B Vaccine  Aged Out    Allergies  Allergen Reactions   Aricept  [Donepezil  Hcl]     Muscle cramps   Latex Itching   Tramadol Other (See Comments)    Pt can't remember what side effects she had, she doesn't take it now    Outpatient Encounter Medications as of 08/22/2024  Medication Sig   acetaminophen  (TYLENOL ) 500 MG tablet Take 500 mg by mouth 2 (two) times daily.   aspirin  EC 81 MG tablet Take 81 mg by mouth daily. Swallow whole.   Calcium Carbonate-Vit D-Min (CALTRATE 600+D PLUS MINERALS) 600-800 MG-UNIT TABS Take 1 tablet by mouth daily.   divalproex (DEPAKOTE) 125 MG DR tablet Take 125 mg by mouth 3 (three) times daily. UNSPECIFIED DEMENTIA, UNSPECIFIED SEVERITY, WITHOUT BEHAVIORAL DISTURBANCE, PSYCHOTIC DISTURBANCE, MOOD DISTURBANCE, AND ANXIETY (F03.90)   FERROUS SULFATE  PO Take 7.5 mLs by mouth every Monday, Wednesday, and Friday.   furosemide  (LASIX ) 20 MG tablet Take 20 mg by mouth every Monday, Wednesday, and Friday. And Saturday   guaiFENesin -dextromethorphan (ROBITUSSIN DM) 100-10 MG/5ML syrup Take 10 mLs by mouth every 6 (six) hours as needed for cough.   lactose free nutrition (BOOST) LIQD Take 237 mLs by mouth as needed.   LORazepam  (ATIVAN ) 0.5 MG tablet Take 0.5 mg by mouth as directed. On Tuesday and Friday for agitation on shower days, hold for drowsiness   melatonin 3 MG TABS tablet Take 3 mg by mouth at bedtime.   memantine  (NAMENDA ) 5 MG tablet Take by mouth 2 (two) times daily.   mirtazapine  (REMERON ) 30 MG tablet TAKE ONE TABLET AT BEDTIME.   polyethylene glycol (MIRALAX )  17 g packet Take 17 g by mouth as needed.   potassium chloride SA (KLOR-CON M) 20 MEQ tablet Take 20 mEq by mouth every Monday, Wednesday, and Friday. And Saturday   rivastigmine  (EXELON ) 4.6 mg/24hr Place 4.6 mg onto the skin daily.   senna (SENOKOT) 8.6 MG TABS tablet Take 1 tablet (8.6 mg total) by mouth at bedtime.   sertraline  (ZOLOFT ) 50 MG tablet Take 1 tablet (50 mg total) by mouth daily.   Sod Fluoride-Potassium Nitrate (SODIUM FLUORIDE 5000 ENAMEL DT) Place 1 Application onto teeth daily.   vitamin B-12 (CYANOCOBALAMIN) 500 MCG tablet Take 500 mcg by mouth daily.   No facility-administered encounter medications on file as of 08/22/2024.    Review of Systems  Constitutional:  Negative for fever.  Respiratory:  Negative for cough and shortness of breath.   Cardiovascular:  Negative for chest pain.  Gastrointestinal:  Negative for abdominal pain, blood in stool, constipation, diarrhea and vomiting.  Genitourinary:  Negative for dysuria.  Neurological:  Negative for dizziness.  Psychiatric/Behavioral:  Positive for agitation.    Negative unless indicated in HPI.  There were no vitals filed for this visit. There is no height or weight on file to calculate BMI. BP  Readings from Last 3 Encounters:  07/28/24 124/72  06/28/24 (!) 154/78  05/16/24 134/70   Wt Readings from Last 3 Encounters:  07/28/24 150 lb (68 kg)  06/28/24 149 lb 12.8 oz (67.9 kg)  05/16/24 151 lb 11.2 oz (68.8 kg)   Physical Exam Constitutional:      Appearance: Normal appearance.  HENT:     Head: Normocephalic and atraumatic.  Cardiovascular:     Rate and Rhythm: Normal rate and regular rhythm.  Pulmonary:     Effort: Pulmonary effort is normal. No respiratory distress.     Breath sounds: Normal breath sounds. No wheezing.  Abdominal:     General: Bowel sounds are normal. There is no distension.     Tenderness: There is no abdominal tenderness. There is no guarding.     Comments:    Musculoskeletal:        General: No swelling.  Neurological:     Mental Status: She is alert. Mental status is at baseline.     Motor: No weakness.     Labs reviewed: Basic Metabolic Panel: Recent Labs    08/25/23 0000 01/05/24 0000 05/17/24 0000  NA 139 138 140  K 4.2 4.4 4.2  CL 104 104 105  CO2 26* 26* 28*  BUN 22* 21 23*  CREATININE 1.0 1.2* 1.2*  CALCIUM 8.6* 8.8 9.2   Liver Function Tests: Recent Labs    01/05/24 0000 05/17/24 0000  AST 14 14  ALT 11 12  ALKPHOS 46 45  ALBUMIN 3.4* 3.7   No results for input(s): LIPASE, AMYLASE in the last 8760 hours. No results for input(s): AMMONIA in the last 8760 hours. CBC: Recent Labs    12/24/23 0000 01/05/24 0000 05/17/24 0000  WBC 6.6 5.4 6.2  NEUTROABS 3,835.00 2,689.00 3,162.00  HGB 12.3 11.5* 12.0  HCT 37 34* 37  PLT 260 221 234   Cardiac Enzymes: No results for input(s): CKTOTAL, CKMB, CKMBINDEX, TROPONINI in the last 8760 hours. BNP: Invalid input(s): POCBNP No results found for: HGBA1C Lab Results  Component Value Date   TSH 4.36 07/09/2023   Lab Results  Component Value Date   VITAMINB12 672 02/17/2023   Lab Results  Component Value Date   FOLATE 18.0 02/17/2023  Lab  Results  Component Value Date   IRON 22 (L) 02/17/2023   TIBC 265 02/17/2023   FERRITIN 45 02/17/2023    Imaging and Procedures obtained prior to SNF admission: DG FEMUR MIN 2 VIEWS LEFT Result Date: 02/16/2023 CLINICAL DATA:  Postoperative EXAM: LEFT FEMUR 2 VIEWS COMPARISON:  Left hip x-ray 02/15/2023 FINDINGS: There is a new left femoral intramedullary nail and hip screw fixating comminuted intratrochanteric fracture. Alignment is anatomic. The lesser trochanter is displaced medially. There is no dislocation. There is lateral hip and leg soft tissue swelling, air and skin staples compatible with recent surgery. IMPRESSION: Left femoral intramedullary nail and hip screw fixating comminuted intratrochanteric fracture. Electronically Signed   By: Greig Pique M.D.   On: 02/16/2023 21:17   DG FEMUR MIN 2 VIEWS LEFT Result Date: 02/16/2023 CLINICAL DATA:  Intramedullary nail EXAM: LEFT FEMUR 2 VIEWS COMPARISON:  Hip x-ray 02/15/2023 FINDINGS: Nine intraoperative fluoroscopic views of the left hip. Left-sided hip screw and intramedullary nail fixating intratrochanteric fracture. Alignment is anatomic. Fluoroscopy time: 2 minutes and 56 seconds. Fluoroscopy dose 45.564 micro [place]. IMPRESSION: Left hip screw and intramedullary nail fixating intratrochanteric fracture. Electronically Signed   By: Greig Pique M.D.   On: 02/16/2023 20:34   DG C-Arm 1-60 Min-No Report Result Date: 02/16/2023 Fluoroscopy was utilized by the requesting physician.  No radiographic interpretation.   DG C-Arm 1-60 Min-No Report Result Date: 02/16/2023 Fluoroscopy was utilized by the requesting physician.  No radiographic interpretation.   DG C-Arm 1-60 Min-No Report Result Date: 02/16/2023 Fluoroscopy was utilized by the requesting physician.  No radiographic interpretation.   DG Knee 1-2 Views Left Result Date: 02/16/2023 CLINICAL DATA:  Left knee pain EXAM: LEFT KNEE - 1-2 VIEW COMPARISON:  None Available. FINDINGS:  Atypical frontal view due to knee being held in flexion. There is no obvious fracture involving the left knee on these atypical views. There is tricompartment degenerative change and a probable trace effusion. There is soft tissue swelling anteriorly along the proximal tibia and prepatellar soft tissues. IMPRESSION: Atypical frontal view without obvious fracture involving the left knee. Tricompartment degenerative change with probable trace effusion. Soft tissue swelling anteriorly along the proximal lower leg and prepatellar soft tissues. Electronically Signed   By: Jacob  Kahn M.D.   On: 02/16/2023 16:51   DG Chest Portable 1 View Result Date: 02/15/2023 CLINICAL DATA:  Hip fracture. EXAM: PORTABLE CHEST 1 VIEW COMPARISON:  Chest radiograph dated May 22, 2007 FINDINGS: The heart is normal in size. Atherosclerotic calcification of the aortic arch. Low lung volumes with left basilar opacity suggesting atelectasis or small effusion. Thoracic spondylosis. No acute osseous abnormality. IMPRESSION: Low lung volumes with left basilar opacity suggesting atelectasis or small effusion. Electronically Signed   By: Imran  Ahmed D.O.   On: 02/15/2023 22:07   DG Hip Unilat W or Wo Pelvis 2-3 Views Left Result Date: 02/15/2023 CLINICAL DATA:  Recent fall with hip pain, initial encounter EXAM: DG HIP (WITH OR WITHOUT PELVIS) 3V LEFT COMPARISON:  None Available. FINDINGS: Pelvic ring appears intact. There is a significantly comminuted left intratrochanteric fracture with impaction and angulation at the fracture site. Femoral head is well seated. No soft tissue abnormality is noted. IMPRESSION: Severely comminuted left intratrochanteric fracture. Electronically Signed   By: Oneil Devonshire M.D.   On: 02/15/2023 22:05   CT HEAD WO CONTRAST ( ) Result Date: 02/15/2023 CLINICAL DATA:  Recent fall with headaches and neck pain, initial encounter EXAM: CT HEAD WITHOUT CONTRAST CT  CERVICAL SPINE WITHOUT CONTRAST TECHNIQUE:  Multidetector CT imaging of the head and cervical spine was performed following the standard protocol without intravenous contrast. Multiplanar CT image reconstructions of the cervical spine were also generated. RADIATION DOSE REDUCTION: This exam was performed according to the departmental dose-optimization program which includes automated exposure control, adjustment of the mA and/or kV according to patient size and/or use of iterative reconstruction technique. COMPARISON:  05/21/2007 FINDINGS: CT HEAD FINDINGS Brain: No evidence of acute infarction, hemorrhage, hydrocephalus, extra-axial collection or mass lesion/mass effect. Progressive atrophic changes are noted commenced with the patient's given age. Chronic white matter ischemic changes are seen. Small lacunar infarct in the right basal ganglia is noted. Vascular: No hyperdense vessel or unexpected calcification. Skull: Normal. Negative for fracture or focal lesion. Sinuses/Orbits: No acute finding. Other: None CT CERVICAL SPINE FINDINGS Alignment: Mild loss of normal cervical lordosis is noted. Skull base and vertebrae: 7 cervical segments are well visualized. Vertebral body height is well maintained. Multilevel osteophytic changes are seen. Multilevel disc space narrowing and facet hypertrophic changes are noted. No acute fracture or acute facet abnormality is noted. Soft tissues and spinal canal: Surrounding soft tissue structures demonstrate diffuse vascular calcifications. No hematoma or other focal abnormality is noted. Upper chest: Visualized lung apices are within normal limits. Other: None IMPRESSION: CT of the head: Progressive atrophic and ischemic changes commensurate with the patient's given age. No acute abnormality noted. CT of the cervical spine: Multilevel degenerative change without acute abnormality. Electronically Signed   By: Oneil Devonshire M.D.   On: 02/15/2023 21:58   CT Cervical Spine Wo Contrast Result Date: 02/15/2023 CLINICAL DATA:   Recent fall with headaches and neck pain, initial encounter EXAM: CT HEAD WITHOUT CONTRAST CT CERVICAL SPINE WITHOUT CONTRAST TECHNIQUE: Multidetector CT imaging of the head and cervical spine was performed following the standard protocol without intravenous contrast. Multiplanar CT image reconstructions of the cervical spine were also generated. RADIATION DOSE REDUCTION: This exam was performed according to the departmental dose-optimization program which includes automated exposure control, adjustment of the mA and/or kV according to patient size and/or use of iterative reconstruction technique. COMPARISON:  05/21/2007 FINDINGS: CT HEAD FINDINGS Brain: No evidence of acute infarction, hemorrhage, hydrocephalus, extra-axial collection or mass lesion/mass effect. Progressive atrophic changes are noted commenced with the patient's given age. Chronic white matter ischemic changes are seen. Small lacunar infarct in the right basal ganglia is noted. Vascular: No hyperdense vessel or unexpected calcification. Skull: Normal. Negative for fracture or focal lesion. Sinuses/Orbits: No acute finding. Other: None CT CERVICAL SPINE FINDINGS Alignment: Mild loss of normal cervical lordosis is noted. Skull base and vertebrae: 7 cervical segments are well visualized. Vertebral body height is well maintained. Multilevel osteophytic changes are seen. Multilevel disc space narrowing and facet hypertrophic changes are noted. No acute fracture or acute facet abnormality is noted. Soft tissues and spinal canal: Surrounding soft tissue structures demonstrate diffuse vascular calcifications. No hematoma or other focal abnormality is noted. Upper chest: Visualized lung apices are within normal limits. Other: None IMPRESSION: CT of the head: Progressive atrophic and ischemic changes commensurate with the patient's given age. No acute abnormality noted. CT of the cervical spine: Multilevel degenerative change without acute abnormality.  Electronically Signed   By: Oneil Devonshire M.D.   On: 02/15/2023 21:58    Assessment and Plan Assessment & Plan  Major Neurocognitive disorder  As per staff pt gets agitated at times Cont with rivastigmine  Cont assistance with ADLS   Depression  Stable  Cont with zoloft , mirtazapine   Diastolic dysfunction  Cont with lasix  Avoid salty foods  Insomnia Cont with melatonin   Constipation  Cont with senna

## 2024-08-25 ENCOUNTER — Encounter: Payer: Self-pay | Admitting: Sports Medicine

## 2024-09-20 ENCOUNTER — Encounter: Payer: Self-pay | Admitting: Nurse Practitioner

## 2024-09-20 ENCOUNTER — Non-Acute Institutional Stay (SKILLED_NURSING_FACILITY): Payer: Self-pay | Admitting: Nurse Practitioner

## 2024-09-20 DIAGNOSIS — N1831 Chronic kidney disease, stage 3a: Secondary | ICD-10-CM

## 2024-09-20 DIAGNOSIS — K5901 Slow transit constipation: Secondary | ICD-10-CM

## 2024-09-20 DIAGNOSIS — D6489 Other specified anemias: Secondary | ICD-10-CM

## 2024-09-20 DIAGNOSIS — M150 Primary generalized (osteo)arthritis: Secondary | ICD-10-CM

## 2024-09-20 DIAGNOSIS — I5032 Chronic diastolic (congestive) heart failure: Secondary | ICD-10-CM

## 2024-09-20 DIAGNOSIS — F339 Major depressive disorder, recurrent, unspecified: Secondary | ICD-10-CM | POA: Diagnosis not present

## 2024-09-20 DIAGNOSIS — R600 Localized edema: Secondary | ICD-10-CM

## 2024-09-20 DIAGNOSIS — E782 Mixed hyperlipidemia: Secondary | ICD-10-CM | POA: Diagnosis not present

## 2024-09-20 DIAGNOSIS — F0390 Unspecified dementia without behavioral disturbance: Secondary | ICD-10-CM

## 2024-09-20 NOTE — Assessment & Plan Note (Signed)
 IDA/Vit B12 deficiency, takes Vit B12, Fe,  Hgb 12.0 05/17/24

## 2024-09-20 NOTE — Assessment & Plan Note (Signed)
MMSE 19/30 12/22,  takes Exelon, Memantine, resides SNF [facility] for safety, care assistance. underwent Neurology consultation.

## 2024-09-20 NOTE — Assessment & Plan Note (Signed)
The  patient's swelling ankles/BLE, mostly right, 08/04/22 negative DVT, BNP 54 07/09/23. Taking Furosemide.

## 2024-09-20 NOTE — Assessment & Plan Note (Signed)
 Bun/creat 23/1.2 05/17/24

## 2024-09-20 NOTE — Assessment & Plan Note (Signed)
stable, on MiraLax, Senokot

## 2024-09-20 NOTE — Assessment & Plan Note (Signed)
 on Mirtazapine (07/08/23 HPOA declined GDR), Sertraline , Depakote, Lorazepam  ac showers, TSH 4.36 07/09/23.

## 2024-09-20 NOTE — Assessment & Plan Note (Signed)
Hx of Hyperlipidemia, not taking statin, LDL 64 02/11/22, takes ASA '81mg'$  qd, refused statin.

## 2024-09-20 NOTE — Assessment & Plan Note (Signed)
 Hx of grade I diastolic dysfunction 2018 per echocardiogram due to SOB, 08/18/22 EF 75%, pulmonary hypertension, mild tricuspid/mitral valve insufficiency, on Furosemide, weight has been stable, 07/09/23 BNP 54

## 2024-09-20 NOTE — Progress Notes (Unsigned)
 Location:  [facility] Room Number: N026-A Place of Service:  SNF (31) Provider:  Pieper Kasik X, NP  Patient Care Team: Dawnell Bryant X, NP as PCP - General (Internal Medicine) Margeret Kotyk, MD as Referring Physician (Physical Medicine and Rehabilitation) Roseann Coad, MD (Inactive) as Consulting Physician (Internal Medicine)  Extended Emergency Contact Information Primary Emergency Contact: Valli Landry Persons, [place] Home Phone: [phone] Relation: Daughter Secondary Emergency Contact: Myers,Lois  [place] Home Phone: [phone] Relation: Daughter  Code Status:  DNR Goals of care: Advanced Directive information    09/20/2024    8:59 AM  Advanced Directives  Does Patient Have a Medical Advance Directive? Yes  Type of Estate agent of [place];Living will;Out of facility DNR (pink MOST or yellow form)  Does patient want to make changes to medical advance directive? No - Patient declined  Copy of Healthcare Power of Attorney in Chart? Yes - validated most recent copy scanned in chart (See row information)  Pre-existing out of facility DNR order (yellow form or pink MOST form) Pink MOST form placed in chart (order not valid for inpatient use);Yellow form placed in chart (order not valid for inpatient use)     Chief Complaint  Patient presents with  . Medical Management of Chronic Issues    Routine Visit, needs to discuss medicare annual wellness visit, flu and covid vaccine.    HPI:  Pt is a 89 y.o. female seen today for medical management of chronic diseases.     Hospitalized 02/15/23-02/19/23 for left hip fx sustained from a ground fall, ORIF IM nail, healed.             R hip pain, X-ray R hip no acute fx or dislocation             Gait abnormality, w/c for mobility.              OA, takes Tylenol .  The  patient's swelling ankles/BLE, mostly right, 08/04/22 negative DVT, BNP 54  07/09/23. Taking Furosemide .  Hx of grade I diastolic dysfunction 2018 per echocardiogram due to SOB, 08/18/22 EF 75%, pulmonary hypertension, mild tricuspid/mitral valve insufficiency, on Furosemide , weight has been stable, 07/09/23 BNP 54 Depression/anxiety, on Mirtazapine (07/08/23 HPOA declined GDR), Sertraline , Depakote, Lorazepam  ac showers, TSH 4.36 07/09/23. Hx of Hyperlipidemia, not taking statin, LDL 64 02/11/22, takes ASA 81mg  qd, refused statin. CKD Bun/creat 23/1.2 05/17/24 IDA/Vit B12 deficiency, takes Vit B12, Fe,  Hgb 12.0 05/17/24 Dementia, MMSE 19/30 12/22,  takes Exelon , Memantine , resides SNF [facility] for safety, care assistance. underwent Neurology consultation. Dyspnea, Hx of 01/08/2017, Grade I diastolic dysfunction 2018 per echocardiogram due to SOB. 08/18/22 Echo EF 75%, pulmonary hypertension, mild mitral/tricuspid valves insufficiency.    Constipation, stable, on MiraLax , Senokot.        Past Medical History:  Diagnosis Date  . BPV (benign positional vertigo)   . Cancer [facility])    uterine cancer  . Carpal tunnel syndrome on left 06/02/2017  . Cognitive changes   . Depression   . Droopy eyelid, right   . 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 Dyslipidemia   . Fibromyalgia   . Granuloma annulare   . History of uterine cancer 2003   treated with hysterectomy  . Lumbar radicular syndrome    left L5  . Macular degeneration    left eye  . Sciatica   . TIA (transient ischemic attack)  Past Surgical History:  Procedure Laterality Date  . ABDOMINAL HYSTERECTOMY    . BREAST BIOPSY  2007  . CATARACT EXTRACTION    . INTRAMEDULLARY (IM) NAIL INTERTROCHANTERIC Left 02/16/2023   Procedure: INTRAMEDULLARY (IM) NAIL INTERTROCHANTERIC;  Surgeon: Georgina Ozell LABOR, MD;  Location: [facility];  Service: Orthopedics;  Laterality: Left;  . TONSILLECTOMY      Allergies  Allergen Reactions  . Aricept  [Donepezil  Hcl]     Muscle cramps  . Latex Itching  . Tramadol Other (See Comments)    Pt can't  remember what side effects she had, she doesn't take it now    Outpatient Encounter Medications as of 09/20/2024  Medication Sig  . acetaminophen  (TYLENOL ) 500 MG tablet Take 500 mg by mouth 2 (two) times daily.  . aspirin  EC 81 MG tablet Take 81 mg by mouth daily. Swallow whole.  . Calcium Carbonate-Vit D-Min (CALTRATE 600+D PLUS MINERALS) 600-800 MG-UNIT TABS Take 1 tablet by mouth daily.  . divalproex (DEPAKOTE) 125 MG DR tablet Take 125 mg by mouth 3 (three) times daily. UNSPECIFIED DEMENTIA, UNSPECIFIED SEVERITY, WITHOUT BEHAVIORAL DISTURBANCE, PSYCHOTIC DISTURBANCE, MOOD DISTURBANCE, AND ANXIETY (F03.90)  . FERROUS SULFATE PO Take 7.5 mLs by mouth every Monday, Wednesday, and Friday.  . furosemide  (LASIX ) 20 MG tablet Take 20 mg by mouth every Monday, Wednesday, and Friday. And Saturday  . guaiFENesin -dextromethorphan (ROBITUSSIN DM) 100-10 MG/5ML syrup Take 10 mLs by mouth every 6 (six) hours as needed for cough.  . lactose free nutrition (BOOST) LIQD Take 237 mLs by mouth as needed.  . LORazepam  (ATIVAN ) 1 MG tablet Take 1 mg by mouth as directed. one time a day every Tue, Fri for Anxiety Give before showers  . melatonin 3 MG TABS tablet Take 3 mg by mouth at bedtime.  . memantine  (NAMENDA ) 5 MG tablet Take by mouth 2 (two) times daily.  . mirtazapine  (REMERON ) 30 MG tablet TAKE ONE TABLET AT BEDTIME.  . Multiple Vitamins-Minerals (SENTRY ADULT PO) Take 1 tablet by mouth daily in the afternoon.  SABRA [employer] (SKIN PREP WIPES) MISC Apply 1 Application topically daily in the afternoon.  . polyethylene glycol (MIRALAX ) 17 g packet Take 17 g by mouth as needed.  . potassium chloride SA (KLOR-CON M) 20 MEQ tablet Take 20 mEq by mouth every Monday, Wednesday, and Friday. And Saturday  . rivastigmine  (EXELON ) 4.6 mg/24hr Place 4.6 mg onto the skin daily.  SABRA senna (SENOKOT) 8.6 MG TABS tablet Take 1 tablet (8.6 mg total) by mouth at bedtime.  . sertraline  (ZOLOFT ) 50 MG tablet Take 1  tablet (50 mg total) by mouth daily.  . Sod Fluoride-Potassium Nitrate (SODIUM FLUORIDE 5000 ENAMEL DT) Place 1 Application onto teeth daily.  . vitamin B-12 (CYANOCOBALAMIN) 500 MCG tablet Take 500 mcg by mouth daily.  . [DISCONTINUED] LORazepam  (ATIVAN ) 0.5 MG tablet Take 0.5 mg by mouth as directed. On Tuesday and Friday for agitation on shower days, hold for drowsiness   No facility-administered encounter medications on file as of 09/20/2024.    Review of Systems  Unable to perform ROS: Dementia    Immunization History  Administered Date(s) Administered  . INFLUENZA, HIGH DOSE SEASONAL PF 09/23/2017, 09/28/2019, 10/08/2023  . Influenza,inj,Quad PF,6+ Mos 09/16/2018  . Influenza-Unspecified 09/14/2017, 09/26/2020, 10/03/2021, 10/06/2022  . Moderna SARS-COV2 Booster Vaccination 10/23/2020, 05/14/2021  . Moderna Sars-Covid-2 Vaccination 12/19/2019, 01/16/2020, 05/14/2021  . Pneumococcal Conjugate-13 02/21/2014  . Pneumococcal Polysaccharide-23 02/21/2002  . Pneumococcal-Unspecified 12/15/2001  . Tdap 12/15/2009, 10/13/2022  . Unspecified  SARS-COV-2 Vaccination 10/16/2022  . Zoster Recombinant(Shingrix) 01/12/2023, 04/15/2023  . Zoster, Unspecified 12/15/2005   Pertinent  Health Maintenance Due  Topic Date Due  . Influenza Vaccine  07/15/2024  . DEXA SCAN  Completed      12/29/2018    4:11 PM 02/08/2019    1:54 PM 03/07/2020   12:54 PM 02/20/2023    2:52 PM 05/26/2023    9:34 AM  Fall Risk  Falls in the past year? 0  0  0  1 0  Was there an injury with Fall? 0 0  1 0  Fall Risk Category Calculator 0 0  3 0  Fall Risk Category (Retired) Low  Low      (RETIRED) Patient Fall Risk Level Low fall risk  Low fall risk      Patient at Risk for Falls Due to    History of fall(s) Impaired balance/gait  Fall risk Follow up    Falls evaluation completed Falls evaluation completed     Data saved with a previous flowsheet row definition   Functional Status Survey:    Vitals:    09/20/24 0901  BP: 132/68  Pulse: 77  Resp: (!) 22  Temp: (!) 96 F (35.6 C)  SpO2: 98%  Weight: 146 lb 3.2 oz (66.3 kg)  Height: 5' 5 (1.651 m)   Body mass index is 24.33 kg/m. Physical Exam Vitals and nursing note reviewed.  Constitutional:      Appearance: Normal appearance.  HENT:     Head: Normocephalic and atraumatic.     Nose: Nose normal.     Mouth/Throat:     Mouth: Mucous membranes are moist.     Pharynx: No posterior oropharyngeal erythema.  Eyes:     Extraocular Movements: Extraocular movements intact.     Conjunctiva/sclera: Conjunctivae normal.     Pupils: Pupils are equal, round, and reactive to light.  Cardiovascular:     Rate and Rhythm: Normal rate and regular rhythm.     Heart sounds: No murmur heard. Pulmonary:     Effort: Pulmonary effort is normal.     Breath sounds: No rales.  Abdominal:     General: Bowel sounds are normal.     Palpations: Abdomen is soft.     Tenderness: There is no abdominal tenderness.  Musculoskeletal:     Cervical back: Normal range of motion and neck supple.     Right lower leg: Edema present.     Left lower leg: Edema present.     Comments: Trace edema BLE  Skin:    General: Skin is warm and dry.     Comments: Left hip surgical incision healed.   Neurological:     General: No focal deficit present.     Mental Status: She is alert. Mental status is at baseline.     Gait: Gait abnormal.     Comments: Oriented to person  Psychiatric:     Comments: Anxious and combative when assistance with ADL offered.      Labs reviewed: Recent Labs    01/05/24 0000 05/17/24 0000  NA 138 140  K 4.4 4.2  CL 104 105  CO2 26* 28*  BUN 21 23*  CREATININE 1.2* 1.2*  CALCIUM 8.8 9.2   Recent Labs    01/05/24 0000 05/17/24 0000  AST 14 14  ALT 11 12  ALKPHOS 46 45  ALBUMIN 3.4* 3.7   Recent Labs    12/24/23 0000 01/05/24 0000 05/17/24 0000  WBC 6.6  5.4 6.2  NEUTROABS 3,835.00 2,689.00 3,162.00  HGB 12.3 11.5*  12.0  HCT 37 34* 37  PLT 260 221 234   Lab Results  Component Value Date   TSH 4.36 07/09/2023   No results found for: HGBA1C Lab Results  Component Value Date   CHOL 217 (A) 03/29/2021   HDL 48 03/29/2021   LDLCALC 152 03/29/2021   TRIG 70 03/29/2021   CHOLHDL 4.7 08/01/2019    Significant Diagnostic Results in last 30 days:  No results found.  Assessment/Plan Chronic diastolic CHF (congestive heart failure) (HCC) Hx of grade I diastolic dysfunction 2018 per echocardiogram due to SOB, 08/18/22 EF 75%, pulmonary hypertension, mild tricuspid/mitral valve insufficiency, on Furosemide , weight has been stable, 07/09/23 BNP 54  Depression, recurrent on Mirtazapine (07/08/23 HPOA declined GDR), Sertraline , Depakote, Lorazepam  ac showers, TSH 4.36 07/09/23.  Hyperlipidemia Hx of Hyperlipidemia, not taking statin, LDL 64 02/11/22, takes ASA 81mg  qd, refused statin.  Stage 3a chronic kidney disease (CKD) (HCC) Bun/creat 23/1.2 05/17/24  Anemia due to multiple mechanisms IDA/Vit B12 deficiency, takes Vit B12, Fe,  Hgb 12.0 05/17/24  Major neurocognitive disorder (HCC) MMSE 19/30 12/22,  takes Exelon , Memantine , resides SNF [facility] for safety, care assistance. underwent Neurology consultation.  Slow transit constipation stable, on MiraLax , Senokot.     Osteoarthritis, multiple sites W/c for mobility, Tylenol  is adequate for pain control.  Mild peripheral edema The  patient's swelling ankles/BLE, mostly right, 08/04/22 negative DVT, BNP 54 07/09/23. Taking Furosemide .      Family/ staff Communication: Plan of care reviewed with the patient and charge nurse  Labs/tests ordered: None

## 2024-09-20 NOTE — Assessment & Plan Note (Signed)
 W/c for mobility, Tylenol  is adequate for pain control.

## 2024-10-17 ENCOUNTER — Encounter: Payer: Self-pay | Admitting: Radiology

## 2024-10-21 ENCOUNTER — Non-Acute Institutional Stay (SKILLED_NURSING_FACILITY): Payer: Self-pay | Admitting: Nurse Practitioner

## 2024-10-21 ENCOUNTER — Encounter: Payer: Self-pay | Admitting: Nurse Practitioner

## 2024-10-21 DIAGNOSIS — D6489 Other specified anemias: Secondary | ICD-10-CM | POA: Diagnosis not present

## 2024-10-21 DIAGNOSIS — F339 Major depressive disorder, recurrent, unspecified: Secondary | ICD-10-CM

## 2024-10-21 DIAGNOSIS — K5901 Slow transit constipation: Secondary | ICD-10-CM

## 2024-10-21 DIAGNOSIS — I5032 Chronic diastolic (congestive) heart failure: Secondary | ICD-10-CM | POA: Diagnosis not present

## 2024-10-21 DIAGNOSIS — F0390 Unspecified dementia without behavioral disturbance: Secondary | ICD-10-CM

## 2024-10-21 DIAGNOSIS — I959 Hypotension, unspecified: Secondary | ICD-10-CM

## 2024-10-21 NOTE — Progress Notes (Signed)
 Location:   [facility] Room Number: 72 Place of Service:  SNF (31) Provider: Larwance Taiana Temkin NP  Cynthis Purington X, NP  Patient Care Team: Rachell Druckenmiller X, NP as PCP - General (Internal Medicine) Margeret Kotyk, MD as Referring Physician (Physical Medicine and Rehabilitation) Roseann Coad, MD (Inactive) as Consulting Physician (Internal Medicine)  Extended Emergency Contact Information Primary Emergency Contact: Valli Landry Persons, [place] Home Phone: [phone] Relation: Daughter Secondary Emergency Contact: Myers,Lois  [place] Home Phone: [phone] Relation: Daughter  Code Status:  DNR Goals of care: Advanced Directive information    09/20/2024    8:59 AM  Advanced Directives  Does Patient Have a Medical Advance Directive? Yes  Type of Estate Agent of [place];Living will;Out of facility DNR (pink MOST or yellow form)  Does patient want to make changes to medical advance directive? No - Patient declined  Copy of Healthcare Power of Attorney in Chart? Yes - validated most recent copy scanned in chart (See row information)  Pre-existing out of facility DNR order (yellow form or pink MOST form) Pink MOST form placed in chart (order not valid for inpatient use);Yellow form placed in chart (order not valid for inpatient use)     Chief Complaint  Patient presents with   Medical Management of Chronic Issues    HPI:  Pt is a 89 y.o. female seen today for medical management of chronic diseases.     Hospitalized 02/15/23-02/19/23 for left hip fx sustained from a ground fall, ORIF IM nail, healed.             R hip pain, X-ray R hip no acute fx or dislocation             Gait abnormality, w/c for mobility.              OA, takes Tylenol .  The  patient's swelling ankles/BLE, mostly right, 08/04/22 negative DVT, BNP 54 07/09/23. Taking Furosemide .  Hx of grade I diastolic dysfunction 2018 per echocardiogram due  to SOB, 08/18/22 EF 75%, pulmonary hypertension, mild tricuspid/mitral valve insufficiency, on Furosemide , weight has been stable, 07/09/23 BNP 54 Depression/anxiety, on Mirtazapine (07/08/23 HPOA declined GDR), Sertraline , Depakote, Lorazepam  ac showers, TSH 4.36 07/09/23. Hx of Hyperlipidemia, not taking statin, LDL 64 02/11/22, takes ASA 81mg  qd, refused statin. CKD Bun/creat 23/1.2 05/17/24 IDA/Vit B12 deficiency, takes Vit B12, Fe,  Hgb 12.0 05/17/24 Dementia,  takes Exelon , Memantine , resides SNF [facility] for safety, care assistance. underwent Neurology consultation. Dyspnea, Hx of 01/08/2017, Grade I diastolic dysfunction 2018 per echocardiogram due to SOB. 08/18/22 Echo EF 75%, pulmonary hypertension, mild mitral/tricuspid valves insufficiency.    Constipation, stable, on MiraLax , Senokot.      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   Dyslipidemia    Fibromyalgia    Granuloma annulare    History of uterine cancer 2003   treated with hysterectomy   Lumbar radicular syndrome    left L5   Macular degeneration    left eye   Sciatica    TIA (transient ischemic attack)    Past Surgical History:  Procedure Laterality Date   ABDOMINAL HYSTERECTOMY  BREAST BIOPSY  2007   CATARACT EXTRACTION     INTRAMEDULLARY (IM) NAIL INTERTROCHANTERIC Left 02/16/2023   Procedure: INTRAMEDULLARY (IM) NAIL INTERTROCHANTERIC;  Surgeon: Georgina Ozell LABOR, MD;  Location: [facility];  Service: Orthopedics;  Laterality: Left;   TONSILLECTOMY      Allergies  Allergen Reactions   Aricept  [Donepezil  Hcl]     Muscle cramps   Latex Itching   Tramadol Other (See Comments)    Pt can't remember what side effects she had, she doesn't take it now    Allergies as of 10/21/2024       Reactions   Aricept  [donepezil  Hcl]     Muscle cramps   Latex Itching   Tramadol Other (See Comments)   Pt can't remember what side effects she had, she doesn't take it now        Medication List        Accurate as of October 21, 2024 11:59 PM. If you have any questions, ask your nurse or doctor.          acetaminophen  500 MG tablet Commonly known as: TYLENOL  Take 500 mg by mouth 2 (two) times daily.   aspirin  EC 81 MG tablet Take 81 mg by mouth daily. Swallow whole.   Caltrate 600+D Plus Minerals 600-800 MG-UNIT Tabs Take 1 tablet by mouth daily.   divalproex 125 MG DR tablet Commonly known as: DEPAKOTE Take 125 mg by mouth 3 (three) times daily. UNSPECIFIED DEMENTIA, UNSPECIFIED SEVERITY, WITHOUT BEHAVIORAL DISTURBANCE, PSYCHOTIC DISTURBANCE, MOOD DISTURBANCE, AND ANXIETY (F03.90)   FERROUS SULFATE PO Take 7.5 mLs by mouth every Monday, Wednesday, and Friday.   furosemide  20 MG tablet Commonly known as: LASIX  Take 20 mg by mouth every Monday, Wednesday, and Friday. And Saturday   guaiFENesin -dextromethorphan 100-10 MG/5ML syrup Commonly known as: ROBITUSSIN DM Take 10 mLs by mouth every 6 (six) hours as needed for cough.   lactose free nutrition Liqd Take 237 mLs by mouth as needed.   LORazepam  1 MG tablet Commonly known as: ATIVAN  Take 1 mg by mouth as directed. one time a day every Tue, Fri for Anxiety Give before showers   melatonin 3 MG Tabs tablet Take 3 mg by mouth at bedtime.   memantine  5 MG tablet Commonly known as: NAMENDA  Take by mouth 2 (two) times daily.   MiraLax  17 g packet Generic drug: polyethylene glycol Take 17 g by mouth as needed.   mirtazapine  30 MG tablet Commonly known as: REMERON  TAKE ONE TABLET AT BEDTIME.   potassium chloride SA 20 MEQ tablet Commonly known as: KLOR-CON M Take 20 mEq by mouth every Monday, Wednesday, and Friday. And Saturday   rivastigmine  4.6 mg/24hr Commonly known as: EXELON  Place 4.6 mg onto the skin daily.   senna 8.6 MG Tabs  tablet Commonly known as: SENOKOT Take 1 tablet (8.6 mg total) by mouth at bedtime.   SENTRY ADULT PO Take 1 tablet by mouth daily in the afternoon.   sertraline  50 MG tablet Commonly known as: ZOLOFT  Take 1 tablet (50 mg total) by mouth daily.   Skin Prep Wipes Misc Apply 1 Application topically daily in the afternoon.   SODIUM FLUORIDE 5000 ENAMEL DT Place 1 Application onto teeth daily.   vitamin B-12 500 MCG tablet Commonly known as: CYANOCOBALAMIN Take 500 mcg by mouth daily.        Review of Systems  Unable to perform ROS: Dementia    Immunization History  Administered Date(s) Administered   INFLUENZA, HIGH DOSE  SEASONAL PF 09/23/2017, 09/28/2019, 10/08/2023   Influenza,inj,Quad PF,6+ Mos 09/16/2018   Influenza-Unspecified 09/14/2017, 09/26/2020, 10/03/2021, 10/06/2022   Moderna SARS-COV2 Booster Vaccination 10/23/2020, 05/14/2021   Moderna Sars-Covid-2 Vaccination 12/19/2019, 01/16/2020, 05/14/2021   Pneumococcal Conjugate-13 02/21/2014   Pneumococcal Polysaccharide-23 02/21/2002   Pneumococcal-Unspecified 12/15/2001   Tdap 12/15/2009, 10/13/2022   Unspecified SARS-COV-2 Vaccination 10/16/2022   Zoster Recombinant(Shingrix) 01/12/2023, 04/15/2023   Zoster, Unspecified 12/15/2005   Pertinent  Health Maintenance Due  Topic Date Due   Influenza Vaccine  07/15/2024   DEXA SCAN  Completed      12/29/2018    4:11 PM 02/08/2019    1:54 PM 03/07/2020   12:54 PM 02/20/2023    2:52 PM 05/26/2023    9:34 AM  Fall Risk  Falls in the past year? 0  0  0  1 0  Was there an injury with Fall? 0 0  1 0  Fall Risk Category Calculator 0 0  3 0  Fall Risk Category (Retired) Low  Low      (RETIRED) Patient Fall Risk Level Low fall risk  Low fall risk      Patient at Risk for Falls Due to    History of fall(s) Impaired balance/gait  Fall risk Follow up    Falls evaluation completed Falls evaluation completed     Data saved with a previous flowsheet row definition    Functional Status Survey:    Vitals:   10/21/24 1321 11/01/24 1151  BP: (!) 148/62 (!) 124/56  Pulse: 70   Resp: 20   Temp: (!) 97.2 F (36.2 C)   SpO2: 99%   Weight: 146 lb (66.2 kg)    Body mass index is 24.3 kg/m. Physical Exam Vitals and nursing note reviewed.  Constitutional:      Appearance: Normal appearance.  HENT:     Head: Normocephalic and atraumatic.     Nose: Nose normal.     Mouth/Throat:     Mouth: Mucous membranes are moist.     Pharynx: No posterior oropharyngeal erythema.  Eyes:     Extraocular Movements: Extraocular movements intact.     Conjunctiva/sclera: Conjunctivae normal.     Pupils: Pupils are equal, round, and reactive to light.  Cardiovascular:     Rate and Rhythm: Normal rate and regular rhythm.     Heart sounds: No murmur heard. Pulmonary:     Effort: Pulmonary effort is normal.     Breath sounds: No rales.  Abdominal:     General: Bowel sounds are normal.     Palpations: Abdomen is soft.     Tenderness: There is no abdominal tenderness.  Musculoskeletal:     Cervical back: Normal range of motion and neck supple.     Right lower leg: Edema present.     Left lower leg: Edema present.     Comments: Trace edema BLE  Skin:    General: Skin is warm and dry.     Comments: Left hip surgical incision healed.   Neurological:     General: No focal deficit present.     Mental Status: She is alert. Mental status is at baseline.     Gait: Gait abnormal.     Comments: Oriented to person  Psychiatric:     Comments: Anxious and combative when assistance with ADL offered.      Labs reviewed: Recent Labs    01/05/24 0000 05/17/24 0000  NA 138 140  K 4.4 4.2  CL 104 105  CO2  26* 28*  BUN 21 23*  CREATININE 1.2* 1.2*  CALCIUM 8.8 9.2   Recent Labs    01/05/24 0000 05/17/24 0000  AST 14 14  ALT 11 12  ALKPHOS 46 45  ALBUMIN 3.4* 3.7   Recent Labs    12/24/23 0000 01/05/24 0000 05/17/24 0000  WBC 6.6 5.4 6.2  NEUTROABS  3,835.00 2,689.00 3,162.00  HGB 12.3 11.5* 12.0  HCT 37 34* 37  PLT 260 221 234   Lab Results  Component Value Date   TSH 4.36 07/09/2023   No results found for: HGBA1C Lab Results  Component Value Date   CHOL 217 (A) 03/29/2021   HDL 48 03/29/2021   LDLCALC 152 03/29/2021   TRIG 70 03/29/2021   CHOLHDL 4.7 08/01/2019    Significant Diagnostic Results in last 30 days:  No results found.  Assessment/Plan  Chronic diastolic CHF (congestive heart failure) (HCC) Euvolemic, on low dose of Furosemide .   Depression, recurrent on Mirtazapine (07/08/23 HPOA declined GDR), Sertraline , Depakote, Lorazepam  ac showers, TSH 4.36 07/09/23.  Anemia due to multiple mechanisms  takes Vit B12, Fe,  Hgb 12.0 05/17/24  Major neurocognitive disorder (HCC) takes Exelon , Memantine , resides SNF [facility] for safety, care assistance. underwent Neurology consultation.  Slow transit constipation  stable, on MiraLax , Senokot.   Osteoarthritis, multiple sites Pain is controlled, w/c for mobility.   Hypotension Blood pressure pressure fluctuating, asymptomatic, caution with position change   Family/ staff Communication: plan of care reviewed with the patient and charge nurse.   Labs/tests ordered:  none

## 2024-10-21 NOTE — Assessment & Plan Note (Signed)
 takes Vit B12, Fe,  Hgb 12.0 05/17/24

## 2024-10-21 NOTE — Assessment & Plan Note (Signed)
stable, on MiraLax, Senokot

## 2024-10-21 NOTE — Assessment & Plan Note (Signed)
 on Mirtazapine (07/08/23 HPOA declined GDR), Sertraline , Depakote, Lorazepam  ac showers, TSH 4.36 07/09/23.

## 2024-10-21 NOTE — Assessment & Plan Note (Signed)
takes Exelon, Memantine, resides SNF [facility] for safety, care assistance. underwent Neurology consultation.

## 2024-10-21 NOTE — Assessment & Plan Note (Signed)
 Euvolemic, on low dose of Furosemide .

## 2024-10-21 NOTE — Assessment & Plan Note (Signed)
 Pain is controlled, w/c for mobility.

## 2024-11-01 NOTE — Assessment & Plan Note (Signed)
 Blood pressure pressure fluctuating, asymptomatic, caution with position change

## 2024-11-15 ENCOUNTER — Other Ambulatory Visit: Payer: Self-pay | Admitting: Nurse Practitioner

## 2024-11-15 MED ORDER — LORAZEPAM 1 MG PO TABS: 1.0000 mg | ORAL_TABLET | ORAL | 1 refills | Status: DC

## 2024-11-30 ENCOUNTER — Encounter: Payer: Self-pay | Admitting: Adult Health

## 2024-11-30 ENCOUNTER — Non-Acute Institutional Stay (SKILLED_NURSING_FACILITY): Payer: Self-pay | Admitting: Adult Health

## 2024-11-30 DIAGNOSIS — D72829 Elevated white blood cell count, unspecified: Secondary | ICD-10-CM

## 2024-11-30 DIAGNOSIS — G9340 Encephalopathy, unspecified: Secondary | ICD-10-CM

## 2024-11-30 DIAGNOSIS — G301 Alzheimer's disease with late onset: Secondary | ICD-10-CM

## 2024-11-30 DIAGNOSIS — F411 Generalized anxiety disorder: Secondary | ICD-10-CM | POA: Diagnosis not present

## 2024-11-30 DIAGNOSIS — F02B3 Dementia in other diseases classified elsewhere, moderate, with mood disturbance: Secondary | ICD-10-CM

## 2024-11-30 LAB — CBC AND DIFFERENTIAL
HCT: 37 (ref 36–46)
Hemoglobin: 12.7 (ref 12.0–16.0)
Platelets: 224 K/uL (ref 150–400)
WBC: 13.1

## 2024-11-30 LAB — BASIC METABOLIC PANEL WITH GFR
BUN: 25 — AB (ref 4–21)
CO2: 19 (ref 13–22)
Chloride: 111 — AB (ref 99–108)
Creatinine: 1.3 — AB (ref 0.5–1.1)
Glucose: 137
Potassium: 4.6 meq/L (ref 3.5–5.1)
Sodium: 137 (ref 137–147)

## 2024-11-30 LAB — CBC: RBC: 3.9 (ref 3.87–5.11)

## 2024-11-30 LAB — COMPREHENSIVE METABOLIC PANEL WITH GFR
Calcium: 9.5 (ref 8.7–10.7)
eGFR: 39

## 2024-11-30 NOTE — Progress Notes (Signed)
 " Location:  [facility] Room Number: [phone] [id number] Place of Service:  SNF (31) Provider:  Jereld JAYSON Berneda Estelle, NP   Patient Care Team: Mast, Man X, NP as PCP - General (Internal Medicine) Margeret Kotyk, MD as Referring Physician (Physical Medicine and Rehabilitation) Roseann Coad, MD (Inactive) as Consulting Physician (Internal Medicine)  Extended Emergency Contact Information Primary Emergency Contact: Valli Landry [place]ons, [place] Home Phone: [phone] Relation: Daughter Secondary Emergency Contact: Myers,Lois  [place] Home Phone: [phone] Relation: Daughter  Code Status:  DNR Goals of care: Advanced Directive information    09/20/2024    8:59 AM  Advanced Directives  Does Patient Have a Medical Advance Directive? Yes  Type of Estate Agent of [place];Living will;Out of facility DNR (pink MOST or yellow form)  Does patient want to make changes to medical advance directive? No - Patient declined  Copy of Healthcare Power of Attorney in Chart? Yes - validated most recent copy scanned in chart (See row information)  Pre-existing out of facility DNR order (yellow form or pink MOST form) Pink MOST form placed in chart (order not valid for inpatient use);Yellow form placed in chart (order not valid for inpatient use)     Chief Complaint  Patient presents with   Acute Visit    Sleepy and poor appetite     HPI:  Pt is a 89 y.o. female seen today for an acute visit for poor appetite and sleepy. She is a resident of [facility]. She was given Ativan  1 mg PO 2 days ago when she went to the dentist and 1 day ago before shower,  consecutively. She was seen in the tv room, sitting on her wheelchair, sleepy.  Blood works done and showed WBC 13.1, elevated. No reported fever, cough, nor blood in the urine. Daughter requested for urinalysis with culture and sensitivity and to  start her on antibiotic. 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   Dyslipidemia    Fibromyalgia    Granuloma annulare    History of uterine cancer 2003   treated with hysterectomy   Lumbar radicular syndrome    left L5   Macular degeneration    left eye   Sciatica    TIA (transient ischemic attack)    Past Surgical History:  Procedure Laterality Date   ABDOMINAL HYSTERECTOMY     BREAST BIOPSY  2007   CATARACT EXTRACTION     INTRAMEDULLARY (IM) NAIL INTERTROCHANTERIC Left 02/16/2023   Procedure: INTRAMEDULLARY (IM) NAIL INTERTROCHANTERIC;  Surgeon: Georgina Ozell LABOR, MD;  Location: [facility];  Service: Orthopedics;  Laterality: Left;   TONSILLECTOMY      Allergies[1]  Outpatient Encounter Medications as of 11/30/2024  Medication Sig   acetaminophen  (TYLENOL ) 500 MG tablet Take 500 mg by mouth 2 (two) times daily.   aspirin  EC 81 MG tablet Take 81 mg by mouth daily. Swallow whole.   Calcium Carbonate-Vit D-Min (CALTRATE 600+D PLUS MINERALS) 600-800 MG-UNIT TABS Take 1 tablet by mouth daily.   divalproex (DEPAKOTE) 125 MG DR tablet Take 125 mg by mouth 3 (  three) times daily. UNSPECIFIED DEMENTIA, UNSPECIFIED SEVERITY, WITHOUT BEHAVIORAL DISTURBANCE, PSYCHOTIC DISTURBANCE, MOOD DISTURBANCE, AND ANXIETY (F03.90)   FERROUS SULFATE PO Take 7.5 mLs by mouth every Monday, Wednesday, and Friday.   furosemide  (LASIX ) 20 MG tablet Take 20 mg by mouth every Monday, Wednesday, and Friday. And Saturday   guaiFENesin -dextromethorphan (ROBITUSSIN DM) 100-10 MG/5ML syrup Take 10 mLs by mouth every 6 (six) hours as needed for cough.   lactose free nutrition (BOOST) LIQD Take 237 mLs by mouth as needed.   LORazepam  (ATIVAN ) 1 MG tablet Take 1 tablet (1 mg total)  by mouth as directed. one time a day every Tue, Fri for Anxiety Give before showers   melatonin 3 MG TABS tablet Take 3 mg by mouth at bedtime.   memantine  (NAMENDA ) 5 MG tablet Take by mouth 2 (two) times daily.   mirtazapine  (REMERON ) 30 MG tablet TAKE ONE TABLET AT BEDTIME.   Multiple Vitamins-Minerals (SENTRY ADULT PO) Take 1 tablet by mouth daily in the afternoon.   Ostomy Supplies (SKIN PREP WIPES) MISC Apply 1 Application topically daily in the afternoon.   polyethylene glycol (MIRALAX ) 17 g packet Take 17 g by mouth as needed.   potassium chloride SA (KLOR-CON M) 20 MEQ tablet Take 20 mEq by mouth every Monday, Wednesday, and Friday. And Saturday   rivastigmine  (EXELON ) 4.6 mg/24hr Place 4.6 mg onto the skin daily.   senna (SENOKOT) 8.6 MG TABS tablet Take 1 tablet (8.6 mg total) by mouth at bedtime.   sertraline  (ZOLOFT ) 50 MG tablet Take 1 tablet (50 mg total) by mouth daily.   Sod Fluoride-Potassium Nitrate (SODIUM FLUORIDE 5000 ENAMEL DT) Place 1 Application onto teeth daily.   vitamin B-12 (CYANOCOBALAMIN) 500 MCG tablet Take 500 mcg by mouth daily.   No facility-administered encounter medications on file as of 11/30/2024.    Review of Systems  Unable to obtain due to dementia.  Immunization History  Administered Date(s) Administered   INFLUENZA, HIGH DOSE SEASONAL PF 09/23/2017, 09/28/2019, 10/08/2023   Influenza,inj,Quad PF,6+ Mos 09/16/2018   Influenza-Unspecified 09/14/2017, 09/26/2020, 10/03/2021, 10/06/2022, 11/02/2024   Moderna SARS-COV2 Booster Vaccination 10/23/2020, 05/14/2021   Moderna Sars-Covid-2 Vaccination 12/19/2019, 01/16/2020, 05/14/2021   Pneumococcal Conjugate-13 02/21/2014   Pneumococcal Polysaccharide-23 02/21/2002   Pneumococcal-Unspecified 12/15/2001   Tdap 12/15/2009, 10/13/2022   Unspecified SARS-COV-2 Vaccination 10/16/2022, 10/10/2024   Zoster Recombinant(Shingrix) 01/12/2023, 04/15/2023   Zoster, Unspecified 12/15/2005   Pertinent  Health  Maintenance Due  Topic Date Due   Influenza Vaccine  Completed   Bone Density Scan  Completed      12/29/2018    4:11 PM 02/08/2019    1:54 PM 03/07/2020   12:54 PM 02/20/2023    2:52 PM 05/26/2023    9:34 AM  Fall Risk  Falls in the past year? 0  0  0  1 0  Was there an injury with Fall? 0  0   1  0   Fall Risk Category Calculator 0 0  3 0  Fall Risk Category (Retired) Low  Low      (RETIRED) Patient Fall Risk Level Low fall risk  Low fall risk      Patient at Risk for Falls Due to    History of fall(s) Impaired balance/gait  Fall risk Follow up    Falls evaluation completed Falls evaluation completed     Data saved with a previous flowsheet row definition   Functional Status Survey:    Vitals:   11/30/24 1059  BP: 113/73  Pulse: 86  Resp: 18  Temp: (!) 97.5 F (36.4 C)  SpO2: 96%  Weight: 139 lb 12.8 oz (63.4 kg)  Height: 5' 5 (1.651 m)   Body mass index is 23.26 kg/m. Physical Exam Constitutional:      Appearance: Normal appearance.  HENT:     Head: Normocephalic and atraumatic.     Nose: Nose normal.     Mouth/Throat:     Mouth: Mucous membranes are moist.  Eyes:     Conjunctiva/sclera: Conjunctivae normal.  Cardiovascular:     Rate and Rhythm: Normal rate and regular rhythm.  Pulmonary:     Effort: Pulmonary effort is normal.     Breath sounds: Normal breath sounds.  Abdominal:     General: Bowel sounds are normal.     Palpations: Abdomen is soft.  Musculoskeletal:        General: Normal range of motion.     Cervical back: Normal range of motion.  Skin:    General: Skin is warm and dry.  Neurological:     Mental Status: She is alert.     Comments: sleepy  Psychiatric:        Mood and Affect: Mood normal.        Behavior: Behavior normal.     Labs reviewed: Recent Labs    01/05/24 0000 05/17/24 0000  NA 138 140  K 4.4 4.2  CL 104 105  CO2 26* 28*  BUN 21 23*  CREATININE 1.2* 1.2*  CALCIUM 8.8 9.2   Recent Labs    01/05/24 0000  05/17/24 0000  AST 14 14  ALT 11 12  ALKPHOS 46 45  ALBUMIN 3.4* 3.7   Recent Labs    12/24/23 0000 01/05/24 0000 05/17/24 0000  WBC 6.6 5.4 6.2  NEUTROABS 3,835.00 2,689.00 3,162.00  HGB 12.3 11.5* 12.0  HCT 37 34* 37  PLT 260 221 234   Lab Results  Component Value Date   TSH 4.36 07/09/2023   No results found for: HGBA1C Lab Results  Component Value Date   CHOL 217 (A) 03/29/2021   HDL 48 03/29/2021   LDLCALC 152 03/29/2021   TRIG 70 03/29/2021   CHOLHDL 4.7 08/01/2019    Significant Diagnostic Results in last 30 days:  No results found.  Assessment/Plan  1. Leukocytosis, unspecified type (Primary) -  wbc 13.1, no fever, cough nor chills -  has history of UTIs -  will start Bactrim  DS for possible UTI -  will order chest x-ray to rule out pneumonia - sulfamethoxazole -trimethoprim  (BACTRIM  DS) 800-160 MG tablet; Take 1 tablet by mouth 2 (two) times daily for 5 days.  Dispense: 10 tablet; Refill: 0  2. Encephalopathy acute -  sleepy possibly due to consecutive use of Ativan  1 mg and UTI -  wbc 13.1 -  will get chest x-ray to rule out pneumonia  -  urinalysis with culture to rule out UTI  3. GAD (generalized anxiety disorder) -  continue Ativan  1 mg on Tuesdays and Fridays  4. Moderate late onset Alzheimer's dementia with mood disturbance (HCC) -  BIMS score 4/15, ranging as severe cognitive impairment -  continue Rivastigmine  4.6 mg mg/24 hour patch daily -  continue supportive care   Family/ staff Communication:  discussed plan of care with charge nurse.  Labs/tests ordered:   urinalysis with culture and sensitivity and chest x-ray     [1]  Allergies Allergen Reactions   Aricept  [Donepezil  Hcl]     Muscle cramps  Latex Itching   Tramadol Other (See Comments)    Pt can't remember what side effects she had, she doesn't take it now   "

## 2024-12-01 MED ORDER — SULFAMETHOXAZOLE-TRIMETHOPRIM 800-160 MG PO TABS: 1.0000 | ORAL_TABLET | Freq: Two times a day (BID) | ORAL | 0 refills | Status: AC

## 2024-12-06 ENCOUNTER — Other Ambulatory Visit: Payer: Self-pay | Admitting: Nurse Practitioner

## 2024-12-06 MED ORDER — LORAZEPAM 1 MG PO TABS: 1.0000 mg | ORAL_TABLET | ORAL | 3 refills | Status: AC

## 2024-12-14 ENCOUNTER — Non-Acute Institutional Stay (SKILLED_NURSING_FACILITY): Payer: Self-pay | Admitting: Nurse Practitioner

## 2024-12-14 ENCOUNTER — Encounter: Payer: Self-pay | Admitting: Nurse Practitioner

## 2024-12-14 DIAGNOSIS — I5032 Chronic diastolic (congestive) heart failure: Secondary | ICD-10-CM | POA: Diagnosis not present

## 2024-12-14 DIAGNOSIS — D6489 Other specified anemias: Secondary | ICD-10-CM

## 2024-12-14 DIAGNOSIS — F339 Major depressive disorder, recurrent, unspecified: Secondary | ICD-10-CM

## 2024-12-14 DIAGNOSIS — M150 Primary generalized (osteo)arthritis: Secondary | ICD-10-CM | POA: Diagnosis not present

## 2024-12-14 DIAGNOSIS — N1831 Chronic kidney disease, stage 3a: Secondary | ICD-10-CM

## 2024-12-14 DIAGNOSIS — K5901 Slow transit constipation: Secondary | ICD-10-CM | POA: Diagnosis not present

## 2024-12-14 DIAGNOSIS — F0390 Unspecified dementia without behavioral disturbance: Secondary | ICD-10-CM | POA: Diagnosis not present

## 2024-12-14 DIAGNOSIS — E782 Mixed hyperlipidemia: Secondary | ICD-10-CM

## 2024-12-14 NOTE — Assessment & Plan Note (Signed)
stable, on MiraLax, Senokot

## 2024-12-14 NOTE — Progress Notes (Unsigned)
 " Location:  [facility] Room Number: [id number] Place of Service:  SNF (31) Provider:  Nickisha Hum X, NP  Patient Care Team: Ibraham Levi X, NP as PCP - General (Internal Medicine) Margeret Kotyk, MD as Referring Physician (Physical Medicine and Rehabilitation) Roseann Coad, MD (Inactive) as Consulting Physician (Internal Medicine)  Extended Emergency Contact Information Primary Emergency Contact: Valli Landry Persons, [place] Home Phone: [phone] Relation: Daughter Secondary Emergency Contact: Myers,Lois  [place] Home Phone: [phone] Relation: Daughter  Code Status:  DNR Goals of care: Advanced Directive information    12/14/2024    8:28 AM  Advanced Directives  Does Patient Have a Medical Advance Directive? Yes  Type of Estate Agent of [place];Living will;Out of facility DNR (pink MOST or yellow form)  Does patient want to make changes to medical advance directive? No - Patient declined  Copy of Healthcare Power of Attorney in Chart? Yes - validated most recent copy scanned in chart (See row information)  Pre-existing out of facility DNR order (yellow form or pink MOST form) Pink MOST form placed in chart (order not valid for inpatient use);Yellow form placed in chart (order not valid for inpatient use)     Chief Complaint  Patient presents with   Medical Management of Chronic Issues    Routine Visit, needs medicare annual wellness visit    HPI:  Pt is a 89 y.o. female seen today for medical management of chronic diseases.     Hospitalized 02/15/23-02/19/23 for left hip fx sustained from a ground fall, ORIF IM nail, healed.             R hip pain, X-ray R hip no acute fx or dislocation             Gait abnormality, w/c for mobility.              OA, takes Tylenol .  The  patient's swelling ankles/BLE, mostly right, 08/04/22 negative DVT, BNP 54 07/09/23. Taking Furosemide .  Hx of  grade I diastolic dysfunction 2018 per echocardiogram due to SOB, 08/18/22 EF 75%, pulmonary hypertension, mild tricuspid/mitral valve insufficiency, on Furosemide , weight has been stable, 07/09/23 BNP 54 Depression/anxiety, on Mirtazapine (07/08/23 HPOA declined GDR), Sertraline , Depakote, Lorazepam  ac showers, TSH 4.36 07/09/23. Hx of Hyperlipidemia, not taking statin, LDL 64 02/11/22, takes ASA 81mg  qd, refused statin. CKD Bun/creat 25/1.3 11/30/24 IDA/Vit B12 deficiency, takes Vit B12, Fe,  Hgb 12.7 11/30/24 Dementia,  takes Exelon , Memantine , resides SNF [facility] for safety, care assistance. underwent Neurology consultation. Dyspnea, Hx of 01/08/2017, Grade I diastolic dysfunction 2018 per echocardiogram due to SOB. 08/18/22 Echo EF 75%, pulmonary hypertension, mild mitral/tricuspid valves insufficiency.    Constipation, stable, on MiraLax , Senokot.      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   Dyslipidemia    Fibromyalgia    Granuloma annulare    History of uterine cancer 2003   treated with hysterectomy   Lumbar radicular syndrome    left L5   Macular degeneration    left eye   Sciatica    TIA (transient ischemic attack)    Past Surgical History:  Procedure Laterality Date  ABDOMINAL HYSTERECTOMY     BREAST BIOPSY  2007   CATARACT EXTRACTION     INTRAMEDULLARY (IM) NAIL INTERTROCHANTERIC Left 02/16/2023   Procedure: INTRAMEDULLARY (IM) NAIL INTERTROCHANTERIC;  Surgeon: Georgina Ozell LABOR, MD;  Location: [facility];  Service: Orthopedics;  Laterality: Left;   TONSILLECTOMY      Allergies[1]  Outpatient Encounter Medications as of 12/14/2024  Medication Sig   acetaminophen  (TYLENOL ) 500 MG tablet Take 500 mg by mouth 2 (two) times daily.   aspirin  EC 81 MG tablet Take 81 mg by mouth  daily. Swallow whole.   Calcium Carbonate-Vit D-Min (CALTRATE 600+D PLUS MINERALS) 600-800 MG-UNIT TABS Take 1 tablet by mouth daily.   divalproex (DEPAKOTE) 125 MG DR tablet Take 125 mg by mouth 3 (three) times daily. UNSPECIFIED DEMENTIA, UNSPECIFIED SEVERITY, WITHOUT BEHAVIORAL DISTURBANCE, PSYCHOTIC DISTURBANCE, MOOD DISTURBANCE, AND ANXIETY (F03.90)   FERROUS SULFATE PO Take 7.5 mLs by mouth every Monday, Wednesday, and Friday.   furosemide  (LASIX ) 20 MG tablet Take 20 mg by mouth every Monday, Wednesday, and Friday. And Saturday   guaiFENesin -dextromethorphan (ROBITUSSIN DM) 100-10 MG/5ML syrup Take 10 mLs by mouth every 6 (six) hours as needed for cough.   LORazepam  (ATIVAN ) 1 MG tablet Take 1 tablet (1 mg total) by mouth as directed. one time a day every Tue, Fri for Anxiety Give before showers   melatonin 3 MG TABS tablet Take 3 mg by mouth at bedtime.   memantine  (NAMENDA ) 5 MG tablet Take by mouth 2 (two) times daily.   mirtazapine  (REMERON ) 30 MG tablet TAKE ONE TABLET AT BEDTIME.   Multiple Vitamins-Minerals (SENTRY ADULT PO) Take 1 tablet by mouth daily in the afternoon.   polyethylene glycol (MIRALAX ) 17 g packet Take 17 g by mouth as needed.   potassium chloride SA (KLOR-CON M) 20 MEQ tablet Take 20 mEq by mouth every Monday, Wednesday, and Friday. And Saturday   rivastigmine  (EXELON ) 4.6 mg/24hr Place 4.6 mg onto the skin daily.   senna (SENOKOT) 8.6 MG TABS tablet Take 1 tablet (8.6 mg total) by mouth at bedtime.   sertraline  (ZOLOFT ) 50 MG tablet Take 1 tablet (50 mg total) by mouth daily.   Sod Fluoride-Potassium Nitrate (SODIUM FLUORIDE 5000 ENAMEL DT) Place 1 Application onto teeth daily.   vitamin B-12 (CYANOCOBALAMIN) 500 MCG tablet Take 500 mcg by mouth daily.   lactose free nutrition (BOOST) LIQD Take 237 mLs by mouth as needed. (Patient not taking: Reported on 12/14/2024)   Ostomy Supplies (SKIN PREP WIPES) MISC Apply 1 Application topically daily in the afternoon.  (Patient not taking: Reported on 12/14/2024)   No facility-administered encounter medications on file as of 12/14/2024.    Review of Systems  Unable to perform ROS: Dementia    Immunization History  Administered Date(s) Administered   INFLUENZA, HIGH DOSE SEASONAL PF 09/23/2017, 09/28/2019, 10/08/2023   Influenza,inj,Quad PF,6+ Mos 09/16/2018   Influenza-Unspecified 09/14/2017, 09/26/2020, 10/03/2021, 10/06/2022, 11/02/2024   Moderna SARS-COV2 Booster Vaccination 10/23/2020, 05/14/2021   Moderna Sars-Covid-2 Vaccination 12/19/2019, 01/16/2020, 05/14/2021   Pneumococcal Conjugate-13 02/21/2014   Pneumococcal Polysaccharide-23 02/21/2002   Pneumococcal-Unspecified 12/15/2001   Tdap 12/15/2009, 10/13/2022   Unspecified SARS-COV-2 Vaccination 10/16/2022, 10/10/2024   Zoster Recombinant(Shingrix) 01/12/2023, 04/15/2023   Zoster, Unspecified 12/15/2005   Pertinent  Health Maintenance Due  Topic Date Due   Influenza Vaccine  Completed   Bone Density Scan  Completed      12/29/2018    4:11 PM 02/08/2019    1:54 PM 03/07/2020   12:54 PM 02/20/2023  2:52 PM 05/26/2023    9:34 AM  Fall Risk  Falls in the past year? 0  0  0  1 0  Was there an injury with Fall? 0  0   1  0   Fall Risk Category Calculator 0 0  3 0  Fall Risk Category (Retired) Low  Low      (RETIRED) Patient Fall Risk Level Low fall risk  Low fall risk      Patient at Risk for Falls Due to    History of fall(s) Impaired balance/gait  Fall risk Follow up    Falls evaluation completed Falls evaluation completed     Data saved with a previous flowsheet row definition   Functional Status Survey:    Vitals:   12/14/24 0824  BP: (!) 142/71  Pulse: 73  Resp: 18  Temp: (!) 97.2 F (36.2 C)  SpO2: 96%  Weight: 138 lb 8 oz (62.8 kg)  Height: 5' 5 (1.651 m)   Body mass index is 23.05 kg/m. Physical Exam Vitals and nursing note reviewed.  Constitutional:      Appearance: Normal appearance.  HENT:     Head:  Normocephalic and atraumatic.     Nose: Nose normal.     Mouth/Throat:     Mouth: Mucous membranes are moist.     Pharynx: No posterior oropharyngeal erythema.  Eyes:     Extraocular Movements: Extraocular movements intact.     Conjunctiva/sclera: Conjunctivae normal.     Pupils: Pupils are equal, round, and reactive to light.  Cardiovascular:     Rate and Rhythm: Normal rate and regular rhythm.     Heart sounds: No murmur heard. Pulmonary:     Effort: Pulmonary effort is normal.     Breath sounds: No rales.  Abdominal:     General: Bowel sounds are normal.     Palpations: Abdomen is soft.     Tenderness: There is no abdominal tenderness.  Musculoskeletal:     Cervical back: Normal range of motion and neck supple.     Right lower leg: Edema present.     Left lower leg: Edema present.     Comments: Trace edema BLE  Skin:    General: Skin is warm and dry.     Comments: Left hip surgical incision healed.   Neurological:     General: No focal deficit present.     Mental Status: She is alert. Mental status is at baseline.     Gait: Gait abnormal.     Comments: Oriented to person  Psychiatric:     Comments: Anxious and combative when assistance with ADL offered.      Labs reviewed: Recent Labs    01/05/24 0000 05/17/24 0000 11/30/24 1100  NA 138 140 137  K 4.4 4.2 4.6  CL 104 105 111*  CO2 26* 28* 19  BUN 21 23* 25*  CREATININE 1.2* 1.2* 1.3*  CALCIUM 8.8 9.2 9.5   Recent Labs    01/05/24 0000 05/17/24 0000  AST 14 14  ALT 11 12  ALKPHOS 46 45  ALBUMIN 3.4* 3.7   Recent Labs    12/24/23 0000 01/05/24 0000 05/17/24 0000 11/30/24 1100  WBC 6.6 5.4 6.2 13.1  NEUTROABS 3,835.00 2,689.00 3,162.00  --   HGB 12.3 11.5* 12.0 12.7  HCT 37 34* 37 37  PLT 260 221 234 224   Lab Results  Component Value Date   TSH 4.36 07/09/2023   No results found for:  HGBA1C Lab Results  Component Value Date   CHOL 217 (A) 03/29/2021   HDL 48 03/29/2021   LDLCALC 152  03/29/2021   TRIG 70 03/29/2021   CHOLHDL 4.7 08/01/2019    Significant Diagnostic Results in last 30 days:  No results found.  Assessment/Plan Depression, recurrent Her mood is stable on Mirtazapine (07/08/23 HPOA declined GDR), Sertraline , Depakote, Lorazepam  ac showers, TSH 4.36 07/09/23.  Hyperlipidemia  not taking statin, LDL 64 02/11/22, takes ASA 81mg  qd, refused statin.  Stage 3a chronic kidney disease (CKD) (HCC) Bun/creat 25/1.3 11/30/24  Anemia due to multiple mechanisms IDA/Vit B12 deficiency, takes Vit B12, Fe,  Hgb 12.7 11/30/24  Major neurocognitive disorder (HCC)  takes Exelon , Memantine , resides SNF [facility] for safety, care assistance. underwent Neurology consultation.  Slow transit constipation stable, on MiraLax , Senokot.   Chronic diastolic CHF (congestive heart failure) (HCC) Euvolemic Hx of grade I diastolic dysfunction 2018 per echocardiogram due to SOB, 08/18/22 EF 75%, pulmonary hypertension, mild tricuspid/mitral valve insufficiency, on Furosemide , weight has been stable, 07/09/23 BNP 54  Osteoarthritis, multiple sites  R hip pain, X-ray R hip no acute fx or dislocation             Gait abnormality, w/c for mobility.              OA, takes Tylenol .     Family/ staff Communication: plan of care reviewed with the patient and charge nurse.   Labs/tests ordered:  none        [1]  Allergies Allergen Reactions   Aricept  [Donepezil  Hcl]     Muscle cramps   Latex Itching   Tramadol Other (See Comments)    Pt can't remember what side effects she had, she doesn't take it now   "

## 2024-12-14 NOTE — Assessment & Plan Note (Signed)
"   R hip pain, X-ray R hip no acute fx or dislocation             Gait abnormality, w/c for mobility.              OA, takes Tylenol .  "

## 2024-12-14 NOTE — Assessment & Plan Note (Signed)
not taking statin, LDL 64 02/11/22, takes ASA 81mg qd, refused statin. 

## 2024-12-14 NOTE — Assessment & Plan Note (Signed)
 IDA/Vit B12 deficiency, takes Vit B12, Fe,  Hgb 12.7 11/30/24

## 2024-12-14 NOTE — Assessment & Plan Note (Signed)
 Her mood is stable on Mirtazapine (07/08/23 HPOA declined GDR), Sertraline , Depakote, Lorazepam  ac showers, TSH 4.36 07/09/23.

## 2024-12-14 NOTE — Assessment & Plan Note (Signed)
 Euvolemic Hx of grade I diastolic dysfunction 2018 per echocardiogram due to SOB, 08/18/22 EF 75%, pulmonary hypertension, mild tricuspid/mitral valve insufficiency, on Furosemide , weight has been stable, 07/09/23 BNP 54

## 2024-12-14 NOTE — Assessment & Plan Note (Signed)
takes Exelon, Memantine, resides SNF [facility] for safety, care assistance. underwent Neurology consultation.

## 2024-12-14 NOTE — Assessment & Plan Note (Signed)
 Bun/creat 25/1.3 11/30/24

## 2025-01-06 ENCOUNTER — Other Ambulatory Visit: Payer: Self-pay | Admitting: Nurse Practitioner

## 2025-01-19 ENCOUNTER — Encounter: Payer: Self-pay | Admitting: Nurse Practitioner

## 2025-01-19 ENCOUNTER — Non-Acute Institutional Stay: Payer: Self-pay | Admitting: Nurse Practitioner

## 2025-01-19 DIAGNOSIS — F339 Major depressive disorder, recurrent, unspecified: Secondary | ICD-10-CM

## 2025-01-19 DIAGNOSIS — D6489 Other specified anemias: Secondary | ICD-10-CM

## 2025-01-19 DIAGNOSIS — E782 Mixed hyperlipidemia: Secondary | ICD-10-CM

## 2025-01-19 DIAGNOSIS — M150 Primary generalized (osteo)arthritis: Secondary | ICD-10-CM

## 2025-01-19 DIAGNOSIS — N1831 Chronic kidney disease, stage 3a: Secondary | ICD-10-CM

## 2025-01-19 DIAGNOSIS — F0390 Unspecified dementia without behavioral disturbance: Secondary | ICD-10-CM

## 2025-01-19 DIAGNOSIS — I5189 Other ill-defined heart diseases: Secondary | ICD-10-CM

## 2025-01-19 NOTE — Progress Notes (Unsigned)
 " Location:  [facility] Room Number: N026-A Place of Service:  SNF (31) Provider:  Ramey Ketcherside X,NP  Kylon Philbrook X, NP  Patient Care Team: Jennelle Pinkstaff X, NP as PCP - General (Internal Medicine) Margeret Kotyk, MD as Referring Physician (Physical Medicine and Rehabilitation) Roseann Coad, MD (Inactive) as Consulting Physician (Internal Medicine)  Extended Emergency Contact Information Primary Emergency Contact: Valli Landry Pers[place], [place] Home Phone: [phone] Relation: Daughter Secondary Emergency Contact: Myers,Lois  [place] Home Phone: [phone] Relation: Daughter  Code Status:  DNR Goals of care: Advanced Directive information    01/19/2025   11:24 AM  Advanced Directives  Does Patient Have a Medical Advance Directive? Yes  Type of Estate Agent of [place];Living will;Out of facility DNR (pink MOST or yellow form)  Does patient want to make changes to medical advance directive? No - Patient declined  Copy of Healthcare Power of Attorney in Chart? Yes - validated most recent copy scanned in chart (See row information)  Pre-existing out of facility DNR order (yellow form or pink MOST form) Pink MOST form placed in chart (order not valid for inpatient use);Yellow form placed in chart (order not valid for inpatient use)     Chief Complaint  Patient presents with   Medical Management of Chronic Issues    Routine visit.    HPI:  Pt is a 89 y.o. female seen today for medical management of chronic diseases.     Hospitalized 02/15/23-02/19/23 for left hip fx sustained from a ground fall, ORIF IM nail, healed.             R hip pain, X-ray R hip no acute fx or dislocation             Gait abnormality, w/c for mobility.              OA, takes Tylenol .  The  patient's swelling ankles/BLE, mostly right, 08/04/22 negative DVT, BNP 54 07/09/23. Taking Furosemide .  Hx of grade I diastolic  dysfunction 2018 per echocardiogram due to SOB, 08/18/22 EF 75%, pulmonary hypertension, mild tricuspid/mitral valve insufficiency, on Furosemide , weight has been stable, 07/09/23 BNP 54 Depression/anxiety, on Mirtazapine (07/08/23 HPOA declined GDR), Sertraline , Depakote, Lorazepam  ac showers, TSH 4.36 07/09/23. Hx of Hyperlipidemia, not taking statin, LDL 64 02/11/22, takes ASA 81mg  qd, refused statin. CKD Bun/creat 25/1.3 11/30/24 IDA/Vit B12 deficiency, takes Vit B12, Fe,  Hgb 12.7 11/30/24 Dementia,  takes Exelon , Memantine , resides SNF [facility] for safety, care assistance. underwent Neurology consultation. Dyspnea, Hx of 01/08/2017, Grade I diastolic dysfunction 2018 per echocardiogram due to SOB. 08/18/22 Echo EF 75%, pulmonary hypertension, mild mitral/tricuspid valves insufficiency.    Constipation, stable, on MiraLax , Senokot.        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   Dyslipidemia    Fibromyalgia    Granuloma annulare    History of uterine cancer 2003   treated with hysterectomy   Lumbar radicular syndrome    left L5   Macular degeneration    left eye   Sciatica    TIA (transient ischemic attack)    Past Surgical History:  Procedure Laterality Date  ABDOMINAL HYSTERECTOMY     BREAST BIOPSY  2007   CATARACT EXTRACTION     INTRAMEDULLARY (IM) NAIL INTERTROCHANTERIC Left 02/16/2023   Procedure: INTRAMEDULLARY (IM) NAIL INTERTROCHANTERIC;  Surgeon: Georgina Ozell LABOR, MD;  Location: [facility];  Service: Orthopedics;  Laterality: Left;   TONSILLECTOMY      Allergies[1]  Outpatient Encounter Medications as of 01/19/2025  Medication Sig   acetaminophen  (TYLENOL ) 500 MG tablet Take 500 mg by mouth 2 (two) times daily.   aspirin  EC 81 MG tablet Take 81 mg by mouth daily. Swallow  whole.   Calcium Carbonate-Vit D-Min (CALTRATE 600+D PLUS MINERALS) 600-800 MG-UNIT TABS Take 1 tablet by mouth daily.   divalproex (DEPAKOTE) 125 MG DR tablet Take 125 mg by mouth 3 (three) times daily. UNSPECIFIED DEMENTIA, UNSPECIFIED SEVERITY, WITHOUT BEHAVIORAL DISTURBANCE, PSYCHOTIC DISTURBANCE, MOOD DISTURBANCE, AND ANXIETY (F03.90)   FERROUS SULFATE PO Take 7.5 mLs by mouth every Monday, Wednesday, and Friday.   furosemide  (LASIX ) 20 MG tablet Take 20 mg by mouth every Monday, Wednesday, and Friday. And Saturday   guaiFENesin -dextromethorphan (ROBITUSSIN DM) 100-10 MG/5ML syrup Take 10 mLs by mouth every 6 (six) hours as needed for cough.   LORazepam  (ATIVAN ) 1 MG tablet Take 1 tablet (1 mg total) by mouth as directed. one time a day every Tue, Fri for Anxiety Give before showers   melatonin 3 MG TABS tablet Take 3 mg by mouth at bedtime.   memantine  (NAMENDA ) 5 MG tablet Take by mouth 2 (two) times daily.   mirtazapine  (REMERON ) 30 MG tablet TAKE ONE TABLET AT BEDTIME.   Multiple Vitamins-Minerals (SENTRY ADULT PO) Take 1 tablet by mouth daily in the afternoon.   polyethylene glycol (MIRALAX ) 17 g packet Take 17 g by mouth as needed.   potassium chloride SA (KLOR-CON M) 20 MEQ tablet Take 20 mEq by mouth every Monday, Wednesday, and Friday. And Saturday   rivastigmine  (EXELON ) 4.6 mg/24hr Place 4.6 mg onto the skin daily.   senna (SENOKOT) 8.6 MG TABS tablet Take 1 tablet (8.6 mg total) by mouth at bedtime.   sertraline  (ZOLOFT ) 50 MG tablet Take 1 tablet (50 mg total) by mouth daily.   Sod Fluoride-Potassium Nitrate (SODIUM FLUORIDE 5000 ENAMEL DT) Place 1 Application onto teeth daily.   vitamin B-12 (CYANOCOBALAMIN) 500 MCG tablet Take 500 mcg by mouth daily.   No facility-administered encounter medications on file as of 01/19/2025.    Review of Systems  Unable to perform ROS: Dementia    Immunization History  Administered Date(s) Administered   INFLUENZA, HIGH DOSE SEASONAL PF  09/23/2017, 09/28/2019, 10/08/2023   Influenza,inj,Quad PF,6+ Mos 09/16/2018   Influenza-Unspecified 09/14/2017, 09/26/2020, 10/03/2021, 10/06/2022, 11/02/2024   Moderna SARS-COV2 Booster Vaccination 10/23/2020, 05/14/2021   Moderna Sars-Covid-2 Vaccination 12/19/2019, 01/16/2020, 05/14/2021   Pneumococcal Conjugate-13 02/21/2014   Pneumococcal Polysaccharide-23 02/21/2002   Pneumococcal-Unspecified 12/15/2001   Tdap 12/15/2009, 10/13/2022   Unspecified SARS-COV-2 Vaccination 10/16/2022, 10/10/2024   Zoster Recombinant(Shingrix) 01/12/2023, 04/15/2023   Zoster, Unspecified 12/15/2005   Pertinent  Health Maintenance Due  Topic Date Due   Influenza Vaccine  Completed   Bone Density Scan  Completed      12/29/2018    4:11 PM 02/08/2019    1:54 PM 03/07/2020   12:54 PM 02/20/2023    2:52 PM 05/26/2023    9:34 AM  Fall Risk  Falls in the past year? 0  0  0  1 0  Was there an injury with Fall? 0  0   1  0   Fall Risk Category Calculator 0 0  3 0  Fall Risk Category (Retired) Low  Low      (RETIRED) Patient Fall Risk Level Low fall risk  Low fall risk      Patient at Risk for Falls Due to    History of fall(s) Impaired balance/gait  Fall risk Follow up    Falls evaluation completed Falls evaluation completed     Data saved with a previous flowsheet row definition   Functional Status Survey:    Vitals:   01/19/25 1123  BP: (!) 106/53  Pulse: 71  Resp: 18  Temp: (!) 97.2 F (36.2 C)  SpO2: 96%  Weight: 133 lb 4.8 oz (60.5 kg)  Height: 5' 5 (1.651 m)   Body mass index is 22.18 kg/m. Physical Exam Vitals and nursing note reviewed.  Constitutional:      Appearance: Normal appearance.  HENT:     Head: Normocephalic and atraumatic.     Nose: Nose normal.     Mouth/Throat:     Mouth: Mucous membranes are moist.     Pharynx: No posterior oropharyngeal erythema.  Eyes:     Extraocular Movements: Extraocular movements intact.     Conjunctiva/sclera: Conjunctivae normal.      Pupils: Pupils are equal, round, and reactive to light.  Cardiovascular:     Rate and Rhythm: Normal rate and regular rhythm.     Heart sounds: No murmur heard. Pulmonary:     Effort: Pulmonary effort is normal.     Breath sounds: No rales.  Abdominal:     General: Bowel sounds are normal.     Palpations: Abdomen is soft.     Tenderness: There is no abdominal tenderness.  Musculoskeletal:     Cervical back: Normal range of motion and neck supple.     Right lower leg: Edema present.     Left lower leg: Edema present.     Comments: Trace edema BLE  Skin:    General: Skin is warm and dry.     Comments: Left hip surgical incision healed.   Neurological:     General: No focal deficit present.     Mental Status: She is alert. Mental status is at baseline.     Gait: Gait abnormal.     Comments: Oriented to person  Psychiatric:     Comments: Anxious and combative when assistance with ADL offered.      Labs reviewed: Recent Labs    05/17/24 0000 11/30/24 1100  NA 140 137  K 4.2 4.6  CL 105 111*  CO2 28* 19  BUN 23* 25*  CREATININE 1.2* 1.3*  CALCIUM 9.2 9.5   Recent Labs    05/17/24 0000  AST 14  ALT 12  ALKPHOS 45  ALBUMIN 3.7   Recent Labs    05/17/24 0000 11/30/24 1100  WBC 6.2 13.1  NEUTROABS 3,162.00  --   HGB 12.0 12.7  HCT 37 37  PLT 234 224   Lab Results  Component Value Date   TSH 4.36 07/09/2023   No results found for: HGBA1C Lab Results  Component Value Date   CHOL 217 (A) 03/29/2021   HDL 48 03/29/2021   LDLCALC 152 03/29/2021   TRIG 70 03/29/2021   CHOLHDL 4.7 08/01/2019    Significant Diagnostic Results in last 30 days:  No results found.  Assessment/Plan Osteoarthritis, multiple sites No longer ambulatory, w/c for mobility, Tylenol  is adequate for pain control.   Grade I  diastolic dysfunction Hx of grade I diastolic dysfunction 2018 per echocardiogram due to SOB, 08/18/22 EF 75%, pulmonary hypertension, mild tricuspid/mitral  valve insufficiency, on Furosemide , weight has been stable, 07/09/23 BNP 54  Depression, recurrent Her mood is stable Depression/anxiety, on Mirtazapine (07/08/23 HPOA declined GDR), Sertraline , Depakote, Lorazepam  ac showers, TSH 4.36 07/09/23.  Hyperlipidemia Hx of Hyperlipidemia, not taking statin, LDL 64 02/11/22, takes ASA 81mg  qd, refused statin.  Stage 3a chronic kidney disease (CKD) (HCC) Bun/creat 25/1.3 11/30/24  Anemia due to multiple mechanisms IDA/Vit B12 deficiency, takes Vit B12, Fe,  Hgb 12.7 11/30/24     Family/ staff Communication: plan of care reviewed with the patient and charge nurse.   Labs/tests ordered:  none        [1]  Allergies Allergen Reactions   Aricept  [Donepezil  Hcl]     Muscle cramps   Latex Itching   Tramadol Other (See Comments)    Pt can't remember what side effects she had, she doesn't take it now   "

## 2025-01-19 NOTE — Assessment & Plan Note (Signed)
 Hx of grade I diastolic dysfunction 2018 per echocardiogram due to SOB, 08/18/22 EF 75%, pulmonary hypertension, mild tricuspid/mitral valve insufficiency, on Furosemide, weight has been stable, 07/09/23 BNP 54

## 2025-01-19 NOTE — Assessment & Plan Note (Signed)
Hx of Hyperlipidemia, not taking statin, LDL 64 02/11/22, takes ASA '81mg'$  qd, refused statin.

## 2025-01-19 NOTE — Assessment & Plan Note (Signed)
 Bun/creat 25/1.3 11/30/24

## 2025-01-19 NOTE — Assessment & Plan Note (Signed)
 No longer ambulatory, w/c for mobility, Tylenol  is adequate for pain control.

## 2025-01-19 NOTE — Assessment & Plan Note (Signed)
 IDA/Vit B12 deficiency, takes Vit B12, Fe,  Hgb 12.7 11/30/24

## 2025-01-19 NOTE — Assessment & Plan Note (Signed)
 Her mood is stable Depression/anxiety, on Mirtazapine (07/08/23 HPOA declined GDR), Sertraline , Depakote, Lorazepam  ac showers, TSH 4.36 07/09/23.

## 2025-01-19 NOTE — Assessment & Plan Note (Signed)
 Dementia,  takes Exelon , Memantine , resides SNF [facility] for safety, care assistance. underwent Neurology consultation.
